# Patient Record
Sex: Male | Born: 1965 | Race: White | Hispanic: No | Marital: Single | Smoking: Never smoker
Health system: Southern US, Community
[De-identification: ages and names within clinical notes are randomized; demographics above are authoritative.]

## PROBLEM LIST (undated history)

## (undated) DIAGNOSIS — N390 Urinary tract infection, site not specified: Secondary | ICD-10-CM

## (undated) DIAGNOSIS — A419 Sepsis, unspecified organism: Secondary | ICD-10-CM

## (undated) DIAGNOSIS — L03116 Cellulitis of left lower limb: Secondary | ICD-10-CM

## (undated) DIAGNOSIS — N312 Flaccid neuropathic bladder, not elsewhere classified: Secondary | ICD-10-CM

## (undated) DIAGNOSIS — R569 Unspecified convulsions: Secondary | ICD-10-CM

## (undated) DIAGNOSIS — Z982 Presence of cerebrospinal fluid drainage device: Secondary | ICD-10-CM

## (undated) DIAGNOSIS — Z789 Other specified health status: Secondary | ICD-10-CM

## (undated) DIAGNOSIS — G919 Hydrocephalus, unspecified: Secondary | ICD-10-CM

## (undated) DIAGNOSIS — Q059 Spina bifida, unspecified: Secondary | ICD-10-CM

## (undated) DIAGNOSIS — I619 Nontraumatic intracerebral hemorrhage, unspecified: Secondary | ICD-10-CM

## (undated) HISTORY — PX: OTHER SURGICAL HISTORY: SHX169

---
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
## 2008-11-14 ENCOUNTER — Inpatient Hospital Stay (HOSPITAL_COMMUNITY): Admission: EM | Admit: 2008-11-14 | Discharge: 2008-11-16 | Payer: Self-pay | Admitting: Neurological Surgery

## 2008-11-18 ENCOUNTER — Ambulatory Visit: Payer: Self-pay | Admitting: Cardiology

## 2009-01-07 ENCOUNTER — Encounter: Admission: RE | Admit: 2009-01-07 | Discharge: 2009-01-07 | Payer: Self-pay | Admitting: Neurological Surgery

## 2010-08-03 LAB — CARDIAC PANEL(CRET KIN+CKTOT+MB+TROPI)
Relative Index: 0.7 (ref 0.0–2.5)
Total CK: 258 U/L — ABNORMAL HIGH (ref 7–232)
Total CK: 382 U/L — ABNORMAL HIGH (ref 7–232)
Troponin I: 0.02 ng/mL (ref 0.00–0.06)
Troponin I: 0.05 ng/mL (ref 0.00–0.06)

## 2010-08-03 LAB — BRAIN NATRIURETIC PEPTIDE: Pro B Natriuretic peptide (BNP): <30 pg/mL (ref 0.0–100.0)

## 2010-09-09 NOTE — H&P (Signed)
NAMECLINT, Carlos Ferguson NO.:  [id number]   MEDICAL RECORD NO.:  [id number]          PATIENT TYPE:  INP   LOCATION:  3101                         FACILITY:  [facility]   PHYSICIAN:  Tia Alert, MD     DATE OF BIRTH:  July 17, 1965   DATE OF ADMISSION:  11/14/2008  DATE OF DISCHARGE:                              HISTORY & PHYSICAL   ADMISSION DIAGNOSES:  1. Chiari II malformation.  2. Syncopal episode versus seizure.   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  He also  underwent ventriculoperitoneal shunting and the family states he has had  a nonworking shunt since the age of 45 at which time he was told he had  outgrown the shunt.  He was at work last evening when his supervisor  noted that he was less responsive.  He then passed out, turned blue,  and then just as they were about to begin CPR, he started breathing and  his color came back.  He was taken to [facility] Emergency Department  where he was worked up.  CT scan showed massive hydrocephalus with 2  densities in the brain.  According to the ER physician, a  neurosurgical evaluation was requested.  The family does report that the  patient had gone to the nursing supervisor earlier in the day stating,  he did not feel right and had his blood pressure taken.  He denies any  headache or visual changes at this time.  He denies any numbness,  tingling, or weakness.  He denies any chest pain other than occasional  chest tightness.  He denies a history of cardiac disease.   PAST MEDICAL HISTORY:  1. Hypertension.  2. Bladder dysfunction.  3. 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   He has had multiple orthopedic procedures to the lower extremities.   MEDICATIONS:  He takes an  antihypertensive, which he does not recall the  name and Ditropan.   ALLERGIES:  PENICILLIN, LATEX, and an ANESTHETIC that he cannot recall.   SOCIAL HISTORY:  Denies use of tobacco or alcohol products.  He works  and ambulates with crutches and braces on the lower extremities.   PHYSICAL EXAMINATION:  He is awake and alert.  He is interactive.  He  has no aphasia.  He has good attention span.  No facial asymmetry.  Tongue protrudes in the midline.  He can puff out his cheeks somewhat.  He has some decreased facial function in the upper face.  Extraocular  movements are intact, but he has nystagmus to the right.  He moves his  upper extremities well with good strength.  His lower extremities are  shortened in length, has had multiple procedures and therefore has  multiple scarring.  He has very little distal function, more proximal  function.  He has braces on his left leg.  He has  a shunt in the right  postauricular region and a well-healed lumbar scar.   Labs were reviewed from the outside hospital.  A CT scan was reviewed.  The CT scan shows typical elongation of the skull and a frontal to  occipital plain, there is abnormality of the shape of the ventricular  system, which is expected and a Chiari II malformation.  There is  obvious descent of the cerebellar tonsils at the level of the foramen  magnum.  There is a shunt in the right occipital horn that is quite  short.  The ventricles do not look overly enlarged to me.  He is always  going to have ventriculomegaly, but this does not look like true  hydrocephalus.  There is no rounding of the third ventricle.  There is a  small round hyperdensity in the left frontal region.  There is also one  adjacent to the right subependymal region of the right anterior horn of  the lateral ventricle.  There is no shift, mass effect, or edema  associated with these lesions.   ASSESSMENT/PLAN:  This is a 45 year old gentleman with a history of   myelomeningocele and Chiari II malformation who had some type of event  at work today whether syncopal event or seizure, it is difficult to  know.  He has a small round hyperdensity in the left frontal region,  which could be a small contusion or intracerebral hemorrhage.  It could  be an old calcified lesion from a previous intracerebral hemorrhage,  especially as an infant.  There is one small area adjacent to the  subependymal region on the right, which looks somewhat similar.  He has  an obvious Chiari malformation with descent of his tonsils at the level  of the foramen magnum.  He has probably a nonfunctioning shunt in the  right occipital region, but this has likely been nonfunctioning for many  many years and it is likely that he no longer needs ventriculoperitoneal  shunting.  In a child who would have this type of symptomatology with  syncope or seizure obviously, we will check the shunt first and foremost  to look for shunt malfunction; however, my guess is this shunt has not  worked for many years and that is not the cause of this event earlier in  the day.  Again, I do not think his ventricular system is overly  enlarged and it does not appear to be acute hydrocephalus.  Certainly,  this could be related to brainstem dysfunction since we know Chiari II  patients have abnormal brain stem nuclei, this could be related to his  Chiari II malformation with descent of the tonsils with pressure on the  brain stem and I think an MRI is warranted, this will help [place] identify  the etiology of the left frontal lesion and also help [place] identify his  Chiari malformation.  Also I think he needs a syncopal workup.  We will  rule out an MI since he has had some chest tightness with cardiac  enzymes.  We will get an EEG to rule out seizure activity.  Dr. Jordan Likes  will assume his care in the morning as I will be out of town for the  next 5 days.  This has been explained to the family.  Questions  have  been encouraged and answered.      Tia Alert, MD  Electronically Signed     DSJ/MEDQ  D:  11/14/2008  T:  11/14/2008  Job:  [id number]

## 2010-09-09 NOTE — Procedures (Signed)
CLINICAL HISTORY:  A 45 year old male was admitted after syncope at  work, had a history of spinal bifida, hydrocephalous, Arnold-Chiari II  malformation, history of hypertension.   CURRENT MEDICATIONS:  Ditropan, Tylenol, Zofran.   TECHNICAL COMMENTS:  Upon awakening, the posterior background activity  has mildly decreased amplitude 14 microvoltage, mixed alpha-theta  frequency.  This was also a technically difficult study, because  frequent bifrontal motion muscle artifacts, and C3 electrode artifact,  there was mildly slowing on the left posterior background activity, in  the range of theta and mixed with some delta activity as well.  I cannot  rule out an effect of electrode artifact to it.   An photic stimulation hyperventilation was performed.  There was no  abnormality elicited other than mentioned above, the patient was drowsy  during recording, but there was no deeper stage of sleep was achieved.   In conclusion, this is a mild abnormal study, there was mild left  posterior background slowing, and also a C3 electrode artifact, with  persistent higher amplitude and mixed theta and delta range activity,  and repeat EEG would be helpful.      Levert Feinstein, MD  Electronically Signed     [id number]  D:  11/14/2008 16:15:36  T:  11/15/2008 05:45:33  Job #:  [id number]

## 2011-12-24 DIAGNOSIS — R112 Nausea with vomiting, unspecified: Secondary | ICD-10-CM

## 2012-04-27 DIAGNOSIS — G919 Hydrocephalus, unspecified: Secondary | ICD-10-CM

## 2012-04-27 DIAGNOSIS — A419 Sepsis, unspecified organism: Secondary | ICD-10-CM

## 2012-04-27 DIAGNOSIS — I619 Nontraumatic intracerebral hemorrhage, unspecified: Secondary | ICD-10-CM

## 2012-04-27 DIAGNOSIS — L03116 Cellulitis of left lower limb: Secondary | ICD-10-CM

## 2012-04-27 HISTORY — DX: Nontraumatic intracerebral hemorrhage, unspecified: I61.9

## 2012-04-27 HISTORY — DX: Sepsis, unspecified organism: A41.9

## 2012-04-27 HISTORY — DX: Hydrocephalus, unspecified: G91.9

## 2012-04-27 HISTORY — DX: Cellulitis of left lower limb: L03.116

## 2018-12-25 ENCOUNTER — Emergency Department (HOSPITAL_COMMUNITY): Payer: Medicare Other

## 2018-12-25 ENCOUNTER — Inpatient Hospital Stay (HOSPITAL_COMMUNITY): Payer: Medicare Other

## 2018-12-25 ENCOUNTER — Other Ambulatory Visit: Payer: Self-pay

## 2018-12-25 ENCOUNTER — Encounter (HOSPITAL_COMMUNITY): Payer: Self-pay

## 2018-12-25 ENCOUNTER — Inpatient Hospital Stay (HOSPITAL_COMMUNITY)
Admission: EM | Admit: 2018-12-25 | Discharge: 2018-12-28 | DRG: 064 | Disposition: A | Discharge status: Rehabilitation Facility | Payer: Medicare Other | Attending: Neurology | Admitting: Neurology

## 2018-12-25 DIAGNOSIS — I614 Nontraumatic intracerebral hemorrhage in cerebellum: Secondary | ICD-10-CM | POA: Diagnosis present

## 2018-12-25 DIAGNOSIS — Z8249 Family history of ischemic heart disease and other diseases of the circulatory system: Secondary | ICD-10-CM | POA: Diagnosis not present

## 2018-12-25 DIAGNOSIS — I619 Nontraumatic intracerebral hemorrhage, unspecified: Secondary | ICD-10-CM | POA: Diagnosis present

## 2018-12-25 DIAGNOSIS — Z87440 Personal history of urinary (tract) infections: Secondary | ICD-10-CM

## 2018-12-25 DIAGNOSIS — Q6589 Other specified congenital deformities of hip: Secondary | ICD-10-CM

## 2018-12-25 DIAGNOSIS — Z20828 Contact with and (suspected) exposure to other viral communicable diseases: Secondary | ICD-10-CM | POA: Diagnosis present

## 2018-12-25 DIAGNOSIS — Z823 Family history of stroke: Secondary | ICD-10-CM

## 2018-12-25 DIAGNOSIS — Z880 Allergy status to penicillin: Secondary | ICD-10-CM

## 2018-12-25 DIAGNOSIS — Y831 Surgical operation with implant of artificial internal device as the cause of abnormal reaction of the patient, or of later complication, without mention of misadventure at the time of the procedure: Secondary | ICD-10-CM | POA: Diagnosis present

## 2018-12-25 DIAGNOSIS — R339 Retention of urine, unspecified: Secondary | ICD-10-CM | POA: Diagnosis present

## 2018-12-25 DIAGNOSIS — T8509XA Other mechanical complication of ventricular intracranial (communicating) shunt, initial encounter: Secondary | ICD-10-CM | POA: Diagnosis present

## 2018-12-25 DIAGNOSIS — I10 Essential (primary) hypertension: Secondary | ICD-10-CM | POA: Diagnosis present

## 2018-12-25 DIAGNOSIS — R197 Diarrhea, unspecified: Secondary | ICD-10-CM | POA: Diagnosis present

## 2018-12-25 DIAGNOSIS — Z91040 Latex allergy status: Secondary | ICD-10-CM

## 2018-12-25 DIAGNOSIS — Z6833 Body mass index (BMI) 33.0-33.9, adult: Secondary | ICD-10-CM | POA: Diagnosis not present

## 2018-12-25 DIAGNOSIS — R569 Unspecified convulsions: Secondary | ICD-10-CM | POA: Diagnosis not present

## 2018-12-25 DIAGNOSIS — G936 Cerebral edema: Secondary | ICD-10-CM | POA: Diagnosis present

## 2018-12-25 DIAGNOSIS — E876 Hypokalemia: Secondary | ICD-10-CM | POA: Diagnosis present

## 2018-12-25 DIAGNOSIS — D72829 Elevated white blood cell count, unspecified: Secondary | ICD-10-CM | POA: Diagnosis not present

## 2018-12-25 DIAGNOSIS — Z8673 Personal history of transient ischemic attack (TIA), and cerebral infarction without residual deficits: Secondary | ICD-10-CM | POA: Diagnosis not present

## 2018-12-25 DIAGNOSIS — Q0703 Arnold-Chiari syndrome with spina bifida and hydrocephalus: Secondary | ICD-10-CM

## 2018-12-25 DIAGNOSIS — Q052 Lumbar spina bifida with hydrocephalus: Secondary | ICD-10-CM | POA: Diagnosis not present

## 2018-12-25 DIAGNOSIS — Q059 Spina bifida, unspecified: Secondary | ICD-10-CM

## 2018-12-25 DIAGNOSIS — N390 Urinary tract infection, site not specified: Secondary | ICD-10-CM | POA: Diagnosis present

## 2018-12-25 DIAGNOSIS — R471 Dysarthria and anarthria: Secondary | ICD-10-CM | POA: Diagnosis present

## 2018-12-25 DIAGNOSIS — G40909 Epilepsy, unspecified, not intractable, without status epilepticus: Secondary | ICD-10-CM | POA: Diagnosis present

## 2018-12-25 DIAGNOSIS — R21 Rash and other nonspecific skin eruption: Secondary | ICD-10-CM | POA: Diagnosis present

## 2018-12-25 DIAGNOSIS — R42 Dizziness and giddiness: Secondary | ICD-10-CM | POA: Diagnosis not present

## 2018-12-25 DIAGNOSIS — R0989 Other specified symptoms and signs involving the circulatory and respiratory systems: Secondary | ICD-10-CM | POA: Diagnosis not present

## 2018-12-25 DIAGNOSIS — N312 Flaccid neuropathic bladder, not elsewhere classified: Secondary | ICD-10-CM | POA: Diagnosis not present

## 2018-12-25 DIAGNOSIS — K592 Neurogenic bowel, not elsewhere classified: Secondary | ICD-10-CM | POA: Diagnosis not present

## 2018-12-25 DIAGNOSIS — R51 Headache: Secondary | ICD-10-CM | POA: Diagnosis not present

## 2018-12-25 DIAGNOSIS — E669 Obesity, unspecified: Secondary | ICD-10-CM | POA: Diagnosis present

## 2018-12-25 DIAGNOSIS — I69391 Dysphagia following cerebral infarction: Secondary | ICD-10-CM | POA: Diagnosis not present

## 2018-12-25 DIAGNOSIS — I69198 Other sequelae of nontraumatic intracerebral hemorrhage: Secondary | ICD-10-CM | POA: Diagnosis not present

## 2018-12-25 HISTORY — DX: Other specified health status: Z78.9

## 2018-12-25 HISTORY — DX: Flaccid neuropathic bladder, not elsewhere classified: N31.2

## 2018-12-25 HISTORY — DX: Presence of cerebrospinal fluid drainage device: Z98.2

## 2018-12-25 HISTORY — DX: Urinary tract infection, site not specified: N39.0

## 2018-12-25 HISTORY — DX: Unspecified convulsions: R56.9

## 2018-12-25 HISTORY — DX: Spina bifida, unspecified: Q05.9

## 2018-12-25 LAB — URINALYSIS, ROUTINE W REFLEX MICROSCOPIC
Bilirubin Urine: NEGATIVE
Glucose, UA: NEGATIVE mg/dL
Hgb urine dipstick: NEGATIVE
Ketones, ur: 5 mg/dL — AB
Leukocytes,Ua: NEGATIVE
Nitrite: NEGATIVE
Protein, ur: NEGATIVE mg/dL
Specific Gravity, Urine: 1.023 (ref 1.005–1.030)
pH: 5 (ref 5.0–8.0)

## 2018-12-25 LAB — CBC WITH DIFFERENTIAL/PLATELET
Abs Immature Granulocytes: 0.03 10*3/uL (ref 0.00–0.07)
Basophils Absolute: 0 10*3/uL (ref 0.0–0.1)
Basophils Relative: 0 %
Eosinophils Absolute: 0 10*3/uL (ref 0.0–0.5)
Eosinophils Relative: 0 %
HCT: 44.6 % (ref 39.0–52.0)
Hemoglobin: 15.7 g/dL (ref 13.0–17.0)
Immature Granulocytes: 0 %
Lymphocytes Relative: 6 %
Lymphs Abs: 0.8 10*3/uL (ref 0.7–4.0)
MCH: 32 pg (ref 26.0–34.0)
MCHC: 35.2 g/dL (ref 30.0–36.0)
MCV: 90.8 fL (ref 80.0–100.0)
Monocytes Absolute: 0.8 10*3/uL (ref 0.1–1.0)
Monocytes Relative: 6 %
Neutro Abs: 11.6 10*3/uL — ABNORMAL HIGH (ref 1.7–7.7)
Neutrophils Relative %: 88 %
Platelets: 248 10*3/uL (ref 150–400)
RBC: 4.91 MIL/uL (ref 4.22–5.81)
RDW: 13.9 % (ref 11.5–15.5)
WBC: 13.2 10*3/uL — ABNORMAL HIGH (ref 4.0–10.5)
nRBC: 0 % (ref 0.0–0.2)

## 2018-12-25 LAB — COMPREHENSIVE METABOLIC PANEL
ALT: 24 U/L (ref 0–44)
AST: 18 U/L (ref 15–41)
Albumin: 3.8 g/dL (ref 3.5–5.0)
Alkaline Phosphatase: 60 U/L (ref 38–126)
Anion gap: 11 (ref 5–15)
BUN: 9 mg/dL (ref 6–20)
CO2: 27 mmol/L (ref 22–32)
Calcium: 9.5 mg/dL (ref 8.9–10.3)
Chloride: 101 mmol/L (ref 98–111)
Creatinine, Ser: 0.86 mg/dL (ref 0.61–1.24)
GFR calc Af Amer: >60 mL/min (ref >60)
GFR calc non Af Amer: >60 mL/min (ref >60)
Glucose, Bld: 115 mg/dL — ABNORMAL HIGH (ref 70–99)
Potassium: 3.6 mmol/L (ref 3.5–5.1)
Sodium: 139 mmol/L (ref 135–145)
Total Bilirubin: 0.6 mg/dL (ref 0.3–1.2)
Total Protein: 7 g/dL (ref 6.5–8.1)

## 2018-12-25 LAB — LIPASE, BLOOD: Lipase: 41 U/L (ref 11–51)

## 2018-12-25 MED ORDER — SODIUM CHLORIDE 0.9 % IV BOLUS
500.0000 mL | Freq: Once | INTRAVENOUS | Status: AC
  Administered 2018-12-25: 16:00:00 500 mL via INTRAVENOUS

## 2018-12-25 MED ORDER — ONDANSETRON HCL 4 MG/2ML IJ SOLN
4.0000 mg | Freq: Once | INTRAMUSCULAR | Status: AC
  Administered 2018-12-25: 4 mg via INTRAVENOUS
  Filled 2018-12-25: qty 2

## 2018-12-25 MED ORDER — ACETAMINOPHEN 650 MG RE SUPP: 650.0000 mg | RECTAL | Status: DC | PRN

## 2018-12-25 MED ORDER — IOHEXOL 350 MG/ML SOLN
80.0000 mL | Freq: Once | INTRAVENOUS | Status: AC | PRN
  Administered 2018-12-25: 80 mL via INTRAVENOUS

## 2018-12-25 MED ORDER — LEVETIRACETAM 750 MG PO TABS
1500.0000 mg | ORAL_TABLET | Freq: Two times a day (BID) | ORAL | Status: DC
  Administered 2018-12-25 – 2018-12-28 (×6): 1500 mg via ORAL
  Filled 2018-12-25 (×6): qty 2

## 2018-12-25 MED ORDER — POTASSIUM CHLORIDE 20 MEQ PO PACK: 20.0000 meq | PACK | Freq: Every day | ORAL | Status: DC

## 2018-12-25 MED ORDER — ACETAMINOPHEN 325 MG PO TABS
650.0000 mg | ORAL_TABLET | ORAL | Status: DC | PRN
  Administered 2018-12-26 – 2018-12-28 (×5): 650 mg via ORAL
  Filled 2018-12-25 (×5): qty 2

## 2018-12-25 MED ORDER — STROKE: EARLY STAGES OF RECOVERY BOOK
Freq: Once | Status: AC
  Administered 2018-12-26: 12:00:00
  Filled 2018-12-25: qty 1

## 2018-12-25 MED ORDER — ATENOLOL 25 MG PO TABS
50.0000 mg | ORAL_TABLET | Freq: Every day | ORAL | Status: DC
  Administered 2018-12-26 – 2018-12-28 (×3): 50 mg via ORAL
  Filled 2018-12-25: qty 1
  Filled 2018-12-25: qty 2
  Filled 2018-12-25: qty 1

## 2018-12-25 MED ORDER — CLEVIDIPINE BUTYRATE 0.5 MG/ML IV EMUL
0.0000 mg/h | INTRAVENOUS | Status: DC
  Administered 2018-12-25: 22:00:00 5 mg/h via INTRAVENOUS
  Administered 2018-12-26: 3 mg/h via INTRAVENOUS
  Administered 2018-12-26: 09:00:00 12 mg/h via INTRAVENOUS
  Filled 2018-12-25 (×4): qty 50

## 2018-12-25 MED ORDER — PANTOPRAZOLE SODIUM 40 MG IV SOLR
40.0000 mg | Freq: Every day | INTRAVENOUS | Status: DC
  Administered 2018-12-25: 40 mg via INTRAVENOUS
  Filled 2018-12-25: qty 40

## 2018-12-25 MED ORDER — SENNOSIDES-DOCUSATE SODIUM 8.6-50 MG PO TABS
1.0000 | ORAL_TABLET | Freq: Two times a day (BID) | ORAL | Status: DC
  Filled 2018-12-25 (×4): qty 1

## 2018-12-25 MED ORDER — GADOBUTROL 1 MMOL/ML IV SOLN
7.0000 mL | Freq: Once | INTRAVENOUS | Status: AC | PRN
  Administered 2018-12-25: 22:00:00 7 mL via INTRAVENOUS

## 2018-12-25 MED ORDER — CHLORHEXIDINE GLUCONATE CLOTH 2 % EX PADS
6.0000 | MEDICATED_PAD | Freq: Every day | CUTANEOUS | Status: DC
  Administered 2018-12-26 – 2018-12-28 (×3): 6 via TOPICAL

## 2018-12-25 MED ORDER — POTASSIUM CHLORIDE CRYS ER 20 MEQ PO TBCR
20.0000 meq | EXTENDED_RELEASE_TABLET | Freq: Every day | ORAL | Status: DC
  Administered 2018-12-26 – 2018-12-28 (×3): 20 meq via ORAL
  Filled 2018-12-25 (×3): qty 1

## 2018-12-25 MED ORDER — ACETAMINOPHEN 160 MG/5ML PO SOLN: 650.0000 mg | ORAL | Status: DC | PRN

## 2018-12-25 MED ORDER — LABETALOL HCL 5 MG/ML IV SOLN
20.0000 mg | Freq: Once | INTRAVENOUS | Status: AC
  Administered 2018-12-25: 20:00:00 20 mg via INTRAVENOUS
  Filled 2018-12-25: qty 4

## 2018-12-25 NOTE — ED Notes (Signed)
ED TO INPATIENT HANDOFF REPORT  ED Nurse Name and Phone #:  Yeni Jiggetts [phone]  S Name/Age/Gender Michel Bickers 53 y.o. male Room/Bed: 039C/039C  Code Status   Code Status: Full Code  Home/SNF/Other Home Patient oriented to: self, place, time and situation Is this baseline? Yes   Triage Complete: Triage complete  Chief Complaint VOMITTING AND HEADACHE  Triage Note Patient arrived via POV due to not feeling weel x1 week. Per pts father, patient started having diarrhea 1 week ago, that lasted the entire Monday. Patient went to ER at [facility] and was treated for a UTI. Did not improve when sent home. Patient then had an ongoing headache and n/v. Went to [facility] on Wednesday-8/26 to be tested for COVID. Patient tested negative. Patient then went back to [facility] on Sat-8/29 and was treated for nausea and given potassium.   Pt came in today due ongoing nausea, headache, and weakness.    Allergies Allergies  Allergen Reactions  . Latex Anaphylaxis and Hives  . Penicillins Hives and Itching    Level of Care/Admitting Diagnosis ED Disposition    ED Disposition Condition Comment   Admit  Hospital Area: [facility] [[id number]]  Level of Care: ICU [6]  Covid Evaluation: Asymptomatic Screening Protocol (No Symptoms)  Diagnosis: ICH (intracerebral hemorrhage) [facility]) [[id number]]  Admitting Physician: Otelia Limes ERIC Valen.Docker  Attending Physician: Otelia Limes, ERIC Valen.Docker  Estimated length of stay: 5 - 7 days  Certification:: I certify this patient will need inpatient services for at least 2 midnights  PT Class (Do Not Modify): Inpatient [101]  PT Acc Code (Do Not Modify): Private [1]       B Medical/Surgery History Past Medical History:  Diagnosis Date  . Self-catheterizes urinary bladder   . Spina bifida [facility])    Past Surgical History:  Procedure Laterality Date  . CSF SHUNT  1969     A IV Location/Drains/Wounds Patient Lines/Drains/Airways Status    Active Line/Drains/Airways    Name:   Placement date:   Placement time:   Site:   Days:   Peripheral IV 12/25/18 Left Antecubital   12/25/18    1610    Antecubital   less than 1          Intake/Output Last 24 hours  Intake/Output Summary (Last 24 hours) at 12/25/2018 2025 Last data filed at 12/25/2018 1826 Gross per 24 hour  Intake 500 ml  Output -  Net 500 ml    Labs/Imaging Results for orders placed or performed during the hospital encounter of 12/25/18 (from the past 48 hour(s))  Comprehensive metabolic panel     Status: Abnormal   Collection Time: 12/25/18  4:23 PM  Result Value Ref Range   Sodium 139 135 - 145 mmol/L   Potassium 3.6 3.5 - 5.1 mmol/L   Chloride 101 98 - 111 mmol/L   CO2 27 22 - 32 mmol/L   Glucose, Bld 115 (H) 70 - 99 mg/dL   BUN 9 6 - 20 mg/dL   Creatinine, Ser 8.28 0.61 - 1.24 mg/dL   Calcium 9.5 8.9 - 00.3 mg/dL   Total Protein 7.0 6.5 - 8.1 g/dL   Albumin 3.8 3.5 - 5.0 g/dL   AST 18 15 - 41 U/L   ALT 24 0 - 44 U/L   Alkaline Phosphatase 60 38 - 126 U/L   Total Bilirubin 0.6 0.3 - 1.2 mg/dL   GFR calc non Af Amer >60 >60 mL/min   GFR calc Af Amer >60 >60  mL/min   Anion gap 11 5 - 15    Comment: Performed at [facility] [address], [place], [place] [address]  CBC with Differential     Status: Abnormal   Collection Time: 12/25/18  4:23 PM  Result Value Ref Range   WBC 13.2 (H) 4.0 - 10.5 K/uL   RBC 4.91 4.22 - 5.81 MIL/uL   Hemoglobin 15.7 13.0 - 17.0 g/dL   HCT 44.6 39.0 - 52.0 %   MCV 90.8 80.0 - 100.0 fL   MCH 32.0 26.0 - 34.0 pg   MCHC 35.2 30.0 - 36.0 g/dL   RDW 13.9 11.5 - 15.5 %   Platelets 248 150 - 400 K/uL   nRBC 0.0 0.0 - 0.2 %   Neutrophils Relative % 88 %   Neutro Abs 11.6 (H) 1.7 - 7.7 K/uL   Lymphocytes Relative 6 %   Lymphs Abs 0.8 0.7 - 4.0 K/uL   Monocytes Relative 6 %   Monocytes Absolute 0.8 0.1 - 1.0 K/uL   Eosinophils Relative 0 %   Eosinophils Absolute 0.0 0.0 - 0.5 K/uL   Basophils Relative 0 %    Basophils Absolute 0.0 0.0 - 0.1 K/uL   Immature Granulocytes 0 %   Abs Immature Granulocytes 0.03 0.00 - 0.07 K/uL    Comment: Performed at [facility] [address] [place], [place] [address]  Lipase, blood     Status: None   Collection Time: 12/25/18  4:23 PM  Result Value Ref Range   Lipase 41 11 - 51 U/L    Comment: Performed at [facility] [address] [place], [place] [address]  Urinalysis, Routine w reflex microscopic     Status: Abnormal   Collection Time: 12/25/18  4:23 PM  Result Value Ref Range   Color, Urine YELLOW YELLOW   APPearance CLEAR CLEAR   Specific Gravity, Urine 1.023 1.005 - 1.030   pH 5.0 5.0 - 8.0   Glucose, UA NEGATIVE NEGATIVE mg/dL   Hgb urine dipstick NEGATIVE NEGATIVE   Bilirubin Urine NEGATIVE NEGATIVE   Ketones, ur 5 (A) NEGATIVE mg/dL   Protein, ur NEGATIVE NEGATIVE mg/dL   Nitrite NEGATIVE NEGATIVE   Leukocytes,Ua NEGATIVE NEGATIVE    Comment: Performed at [facility] [address] [place], [place] [address]   Dg Skull 1-3 Views  Result Date: 12/25/2018 CLINICAL DATA:  Diarrhea. Not feeling well. Evaluate for shunt malfunction. EXAM: SKULL - 1-3 VIEW COMPARISON:  None. FINDINGS: Right parietal approach ventricular shunt catheter terminates posteriorly in the midline. Expected shunt discontinuity overlying right posteroinferior calvarium. Otherwise no evidence of shunt kink or discontinuity in the head or neck. Smooth prominence of the sagittal and coronal sutures. No fractures or focal osseous lesions. IMPRESSION: Right parietal approach ventricular shunt catheter terminates posteriorly in the midline. No unexpected shunt kink or discontinuity in the head or neck. Electronically Signed   By: Ilona Sorrel M.D.   On: 12/25/2018 17:30   Dg Chest 1 View  Result Date: 12/25/2018 CLINICAL DATA:  Evaluate for shunt malfunction. EXAM: CHEST  1 VIEW COMPARISON:  None. FINDINGS: Low lung volumes. Top-normal heart size. Normal  mediastinal contour. No pneumothorax. No pleural effusion. Lungs appear clear, with no acute consolidative airspace disease and no pulmonary edema. Right-sided VP shunt catheter courses inferiorly from the right neck into the medial right chest, and is not clearly visualized below the level of the T6 level. IMPRESSION: Right-sided VP shunt catheter  courses inferiorly from the right neck into the medial right chest, and is not clearly visualized below the level of the T6 level, concerning for shunt catheter fracture. Please see the separate concurrent abdominal radiograph report for further details. A CT of the chest, abdomen and pelvis could be obtained to confirm this finding, as clinically warranted. Electronically Signed   By: Delbert PhenixJason A Poff M.D.   On: 12/25/2018 17:35   Dg Abd 1 View  Result Date: 12/25/2018 CLINICAL DATA:  Evaluate for shunt malfunction. EXAM: ABDOMEN - 1 VIEW COMPARISON:  12/02/2016 CT abdomen/pelvis. FINDINGS: There is a fractured shunt catheter fragment coiled in the deep pelvis, as seen on 12/02/2016 CT abdomen/pelvis, which does not communicate with the shunt catheter in the right neck and upper chest. No dilated small bowel loops. No evidence of pneumatosis or pneumoperitoneum. No radiopaque nephrolithiasis. Sequela of congenital right-sided femoroacetabular dysplasia. Partially visualized fixation hardware in lateral proximal right femur. IMPRESSION: Fractured shunt catheter fragment coiled in the deep pelvis, which does not communicate with the shunt catheter in the right neck and upper chest. Nonobstructive bowel gas pattern. Electronically Signed   By: Delbert PhenixJason A Poff M.D.   On: 12/25/2018 17:39   Ct Head Wo Contrast  Result Date: 12/25/2018 CLINICAL DATA:  53 year old male with headache and vomiting. History of VP shunt. EXAM: CT HEAD WITHOUT CONTRAST TECHNIQUE: Contiguous axial images were obtained from the base of the skull through the vertex without intravenous contrast.  COMPARISON:  01/07/2009 CT FINDINGS: Brain: A 3 cm LEFT cerebellar hematoma is noted with adjacent edema. A RIGHT posterior parietal ventriculostomy catheter is again noted with tip at the posterior midline. Severe hydrocephalus has slightly increased from 2010. Minimal chronic changes in the frontal regions bilaterally noted. Vascular: No hyperdense vessel or unexpected calcification. Skull: No acute abnormality Sinuses/Orbits: No acute abnormality. Other: None IMPRESSION: 1. 3 cm LEFT cerebellar hematoma. 2. Severe hydrocephalus, slightly increased since 2010. Unchanged RIGHT posterior parietal ventriculostomy catheter with tip at the midline. Critical Value/emergent results were called by telephone at the time of interpretation on 12/25/2018 at 4:50 pm to Dr. Shawna OrleansMELANIE BELFI , who verbally acknowledged these results. Electronically Signed   By: Harmon PierJeffrey  Hu M.D.   On: 12/25/2018 16:50    Pending Labs Unresulted Labs (From admission, onward)    Start     Ordered   12/25/18 1918  HIV antibody (Routine Testing)  Once,   STAT     12/25/18 1927   12/25/18 1757  SARS CORONAVIRUS 2 (TAT 6-12 HRS) Nasal Swab Aptima Multi Swab  (Asymptomatic/Tier 2 Patients Labs)  Once,   STAT    Question Answer Comment  Is this test for diagnosis or screening Screening   Symptomatic for COVID-19 as defined by CDC No   Hospitalized for COVID-19 No   Admitted to ICU for COVID-19 No   Previously tested for COVID-19 No   Resident in a congregate (group) care setting No   Employed in healthcare setting No      12/25/18 1756          Vitals/Pain Today's Vitals   12/25/18 1845 12/25/18 1851 12/25/18 1915 12/25/18 1945  BP: (!) 158/75 (!) 175/75 (!) 172/74 (!) 159/74  Pulse:  64    Resp: 15 18 20 18   Temp:  98.5 F (36.9 C)    TempSrc:  Oral    SpO2:  98%    Weight:      Height:      PainSc:  Isolation Precautions No active isolations  Medications Medications   stroke: mapping our early stages of  recovery book (has no administration in time range)  acetaminophen (TYLENOL) tablet 650 mg (has no administration in time range)    Or  acetaminophen (TYLENOL) solution 650 mg (has no administration in time range)    Or  acetaminophen (TYLENOL) suppository 650 mg (has no administration in time range)  senna-docusate (Senokot-S) tablet 1 tablet (has no administration in time range)  pantoprazole (PROTONIX) injection 40 mg (has no administration in time range)  labetalol (NORMODYNE) injection 20 mg (20 mg Intravenous Given 12/25/18 1949)    And  clevidipine (CLEVIPREX) infusion 0.5 mg/mL (has no administration in time range)  sodium chloride 0.9 % bolus 500 mL (0 mLs Intravenous Stopped 12/25/18 1826)  ondansetron (ZOFRAN) injection 4 mg (4 mg Intravenous Given 12/25/18 1615)  ondansetron (ZOFRAN) injection 4 mg (4 mg Intravenous Given 12/25/18 1945)  iohexol (OMNIPAQUE) 350 MG/ML injection 80 mL (80 mLs Intravenous Contrast Given 12/25/18 2001)    Mobility walks with device Moderate fall risk   Focused Assessments gi/gu   R Recommendations: See Admitting Provider Note  Report given to:   Additional Notes:

## 2018-12-25 NOTE — ED Notes (Signed)
Pt in radiology 

## 2018-12-25 NOTE — H&P (Addendum)
Admission H&P    Chief Complaint: Subacute cerebellar hemorrhage.   HPI: Carlos Ferguson is an 53 y.o. male with a history of spina bifida and hydrocephalus, s/p shunt placement many years ago who presents with a 4 day history of N/V. On arrival to the ED, CT was obtained revealing a left cerebellar hemorrhage.   Also noted on CT was a ventricular shunt and interval enlargement of the ventricles (increased ventriculomegaly) relative to the prior CT head obtained in 2010. Shunt series revealed fracture of the shunt catheter consistent with shunt failure.   Past Medical History:  Diagnosis Date  . Self-catheterizes urinary bladder   . Spina bifida [facility](HCC)     Past Surgical History:  Procedure Laterality Date  . CSF SHUNT  1969    History reviewed. No pertinent family history. Social History:  reports that he has never smoked. He has never used smokeless tobacco. He reports previous alcohol use. He reports that he does not use drugs.  Allergies:  Allergies  Allergen Reactions  . Latex Anaphylaxis and Hives  . Penicillins Hives and Itching    Home medications (from bag brought by patient) Keppra 1500 mg po BID Potassium chloride 20 meq qd Atenolol 50 mg po qd Trimethoprim-sulfamethoxazole PRN bladder infection  ROS: 8/10 headache worse with lying that often resolves with standing. No CP, cough, abdominal pain or new limb weakness. Other than the headache and symptoms described in the HPI, comprehensive ROS was negative.   Physical Examination: Blood pressure (!) 175/75, pulse 64, temperature 98.5 F (36.9 C), temperature source Oral, resp. rate 18, height 4\' 9"  (1.448 m), weight 70.8 kg, SpO2 98 %.  HEENT-  Normocephalic Lungs - Respirations unlabored Extremities - Foreshortened/small BLE with muscle atrophy and left knee scarring from prior cellulitis.   Neurologic Examination: Ment: Awake and fully alert. Pleasant and cooperative. Fully oriented. Speech fluent with intact  comprehension and naming. Has dysarthric scanning speech pattern.  CN:  PERRL. EOM are full but with prominent horizontal nystagmus when gazing to left and right. Also with vertical nystagmus induced by upgaze. Facial sensation equal to temp bilaterally. Face symmetric. Phonation intact. Head midline. Tongue midline. Palate elevates normally.  Motor: 5/5 bilateral upper extremities.  0 to 2/5 RLE 3-4/5 LLE Sensory: Intact to FT and temp BUE. Absent FT, temp and pressure sensation to RLE. Some pressure sensation preserved to LLE.  Reflexes: 2+ bilateral upper extremities. 0 bilateral patellae and achilles Cerebellar: No ataxia with FNF bilaterally. Unable to perform H-S bilaterally.  Gait: Deferred. Walks with crutches and braces.    Results for orders placed or performed during the hospital encounter of 12/25/18 (from the past 48 hour(s))  Comprehensive metabolic panel     Status: Abnormal   Collection Time: 12/25/18  4:23 PM  Result Value Ref Range   Sodium 139 135 - 145 mmol/L   Potassium 3.6 3.5 - 5.1 mmol/L   Chloride 101 98 - 111 mmol/L   CO2 27 22 - 32 mmol/L   Glucose, Bld 115 (H) 70 - 99 mg/dL   BUN 9 6 - 20 mg/dL   Creatinine, Ser 1.610.86 0.61 - 1.24 mg/dL   Calcium 9.5 8.9 - 09.610.3 mg/dL   Total Protein 7.0 6.5 - 8.1 g/dL   Albumin 3.8 3.5 - 5.0 g/dL   AST 18 15 - 41 U/L   ALT 24 0 - 44 U/L   Alkaline Phosphatase 60 38 - 126 U/L   Total Bilirubin 0.6 0.3 - 1.2 mg/dL  GFR calc non Af Amer >60 >60 mL/min   GFR calc Af Amer >60 >60 mL/min   Anion gap 11 5 - 15    Comment: Performed at [facility] [address], [place], [place] [address]  CBC with Differential     Status: Abnormal   Collection Time: 12/25/18  4:23 PM  Result Value Ref Range   WBC 13.2 (H) 4.0 - 10.5 K/uL   RBC 4.91 4.22 - 5.81 MIL/uL   Hemoglobin 15.7 13.0 - 17.0 g/dL   HCT 44.6 39.0 - 52.0 %   MCV 90.8 80.0 - 100.0 fL   MCH 32.0 26.0 - 34.0 pg   MCHC 35.2 30.0 - 36.0 g/dL   RDW 13.9 11.5 - 15.5  %   Platelets 248 150 - 400 K/uL   nRBC 0.0 0.0 - 0.2 %   Neutrophils Relative % 88 %   Neutro Abs 11.6 (H) 1.7 - 7.7 K/uL   Lymphocytes Relative 6 %   Lymphs Abs 0.8 0.7 - 4.0 K/uL   Monocytes Relative 6 %   Monocytes Absolute 0.8 0.1 - 1.0 K/uL   Eosinophils Relative 0 %   Eosinophils Absolute 0.0 0.0 - 0.5 K/uL   Basophils Relative 0 %   Basophils Absolute 0.0 0.0 - 0.1 K/uL   Immature Granulocytes 0 %   Abs Immature Granulocytes 0.03 0.00 - 0.07 K/uL    Comment: Performed at [facility] [address], [place], [place] [address]  Lipase, blood     Status: None   Collection Time: 12/25/18  4:23 PM  Result Value Ref Range   Lipase 41 11 - 51 U/L    Comment: Performed at [name] [address], [place], [place] [address]  Urinalysis, Routine w reflex microscopic     Status: Abnormal   Collection Time: 12/25/18  4:23 PM  Result Value Ref Range   Color, Urine YELLOW YELLOW   APPearance CLEAR CLEAR   Specific Gravity, Urine 1.023 1.005 - 1.030   pH 5.0 5.0 - 8.0   Glucose, UA NEGATIVE NEGATIVE mg/dL   Hgb urine dipstick NEGATIVE NEGATIVE   Bilirubin Urine NEGATIVE NEGATIVE   Ketones, ur 5 (A) NEGATIVE mg/dL   Protein, ur NEGATIVE NEGATIVE mg/dL   Nitrite NEGATIVE NEGATIVE   Leukocytes,Ua NEGATIVE NEGATIVE    Comment: Performed at [facility] [address] [place], [place] [address]   Dg Skull 1-3 Views  Result Date: 12/25/2018 CLINICAL DATA:  Diarrhea. Not feeling well. Evaluate for shunt malfunction. EXAM: SKULL - 1-3 VIEW COMPARISON:  None. FINDINGS: Right parietal approach ventricular shunt catheter terminates posteriorly in the midline. Expected shunt discontinuity overlying right posteroinferior calvarium. Otherwise no evidence of shunt kink or discontinuity in the head or neck. Smooth prominence of the sagittal and coronal sutures. No fractures or focal osseous lesions. IMPRESSION: Right parietal approach ventricular shunt catheter terminates  posteriorly in the midline. No unexpected shunt kink or discontinuity in the head or neck. Electronically Signed   By: Ilona Sorrel M.D.   On: 12/25/2018 17:30   Dg Chest 1 View  Result Date: 12/25/2018 CLINICAL DATA:  Evaluate for shunt malfunction. EXAM: CHEST  1 VIEW COMPARISON:  None. FINDINGS: Low lung volumes. Top-normal heart size. Normal mediastinal contour. No pneumothorax. No pleural effusion. Lungs appear clear, with no acute consolidative airspace disease and no pulmonary edema. Right-sided VP shunt catheter courses inferiorly from the right neck into the medial right chest, and  is not clearly visualized below the level of the T6 level. IMPRESSION: Right-sided VP shunt catheter courses inferiorly from the right neck into the medial right chest, and is not clearly visualized below the level of the T6 level, concerning for shunt catheter fracture. Please see the separate concurrent abdominal radiograph report for further details. A CT of the chest, abdomen and pelvis could be obtained to confirm this finding, as clinically warranted. Electronically Signed   By: Delbert Phenix M.D.   On: 12/25/2018 17:35   Dg Abd 1 View  Result Date: 12/25/2018 CLINICAL DATA:  Evaluate for shunt malfunction. EXAM: ABDOMEN - 1 VIEW COMPARISON:  12/02/2016 CT abdomen/pelvis. FINDINGS: There is a fractured shunt catheter fragment coiled in the deep pelvis, as seen on 12/02/2016 CT abdomen/pelvis, which does not communicate with the shunt catheter in the right neck and upper chest. No dilated small bowel loops. No evidence of pneumatosis or pneumoperitoneum. No radiopaque nephrolithiasis. Sequela of congenital right-sided femoroacetabular dysplasia. Partially visualized fixation hardware in lateral proximal right femur. IMPRESSION: Fractured shunt catheter fragment coiled in the deep pelvis, which does not communicate with the shunt catheter in the right neck and upper chest. Nonobstructive bowel gas pattern.  Electronically Signed   By: Delbert Phenix M.D.   On: 12/25/2018 17:39   Ct Head Wo Contrast  Result Date: 12/25/2018 CLINICAL DATA:  53 year old male with headache and vomiting. History of VP shunt. EXAM: CT HEAD WITHOUT CONTRAST TECHNIQUE: Contiguous axial images were obtained from the base of the skull through the vertex without intravenous contrast. COMPARISON:  01/07/2009 CT FINDINGS: Brain: A 3 cm LEFT cerebellar hematoma is noted with adjacent edema. A RIGHT posterior parietal ventriculostomy catheter is again noted with tip at the posterior midline. Severe hydrocephalus has slightly increased from 2010. Minimal chronic changes in the frontal regions bilaterally noted. Vascular: No hyperdense vessel or unexpected calcification. Skull: No acute abnormality Sinuses/Orbits: No acute abnormality. Other: None IMPRESSION: 1. 3 cm LEFT cerebellar hematoma. 2. Severe hydrocephalus, slightly increased since 2010. Unchanged RIGHT posterior parietal ventriculostomy catheter with tip at the midline. Critical Value/emergent results were called by telephone at the time of interpretation on 12/25/2018 at 4:50 pm to Dr. Shawna Orleans BELFI , who verbally acknowledged these results. Electronically Signed   By: Harmon Pier M.D.   On: 12/25/2018 16:50     Assessment: 53 year old male with spina bifida and subacute left cerebellar hemorrhage. 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  1. Exam findings consistent with chronic deficits secondary to spina bifida in conjunction with scanning speech pattern with some dysarthria, secondary to subacute left cerebellar ICH.  2. 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 Admit to 4N ICU with post-hemorrhage order set to include frequent neuro checks and BP management with SBP goal of < 140 with clevidipine gtt and PRN labetalol 2. No antiplatelet meds or anticoagulants. DVT prophylaxis with SCDs 3. Straight caths q4h for flaccid  neurogenic bladder.  4. MRI brain with contrast 5. Repeat CT head in 24 hours 6. Neurosurgery has been consulted to evaluate regarding cerebellar hemorrhage and possible need for new shunt placement at some time in the near future given new onset headaches and increased ventriculomegaly on CT since 2010 7. PT/O/Speech 8. CTA of head and neck to assess for possible aneurysm or occlusion  45 minutes of time spent in the neurological evaluation and management of this critically ill patient.    Electronically signed: Dr. Caryl Pina  12/25/2018, 7:15 PM

## 2018-12-25 NOTE — ED Notes (Signed)
Report given to [place], Writer. Patient currently having CT/MRI scan done.

## 2018-12-25 NOTE — ED Provider Notes (Addendum)
[facility] EMERGENCY DEPARTMENT Provider Note   CSN: [id number] Arrival date & time: 12/25/18  1448     History   Chief Complaint Chief Complaint  Patient presents with  . Nausea  . Weakness    HPI Jorrell Kuster is a 53 y.o. male.     Patient is a 53 year old male with a history of spina bifida who presents with vomiting and diarrhea.  He states that he has had a 4 to 5-day history of vomiting and diarrhea.  He states that the diarrhea started first and is watery in nature.  The next day he started having nausea and vomiting.  He has had some ongoing nausea and vomiting since that time.  He also has a diffuse headache.  It started more on the right side around where his VP shunt is but now it is more diffuse.  He has some dizziness which she describes more as a lightheadedness when he bends over.  He denies any vertiginous type symptoms.  He denies any neck pain.  No fevers.  No associated abdominal pain.  No chest pain or shortness of breath.  He has been seen recently to other emergency departments and was told at one time he had a UTI and is currently on Bactrim.  He was given some potassium replacement.  He states he has not had any improvement in symptoms.     Past Medical History:  Diagnosis Date  . Self-catheterizes urinary bladder   . Spina bifida ([place])     There are no active problems to display for this patient.   Past Surgical History:  Procedure Laterality Date  . CSF SHUNT  1969        Home Medications    Prior to Admission medications   Not on File    Family History History reviewed. No pertinent family history.  Social History Social History   Tobacco Use  . Smoking status: Never Smoker  . Smokeless tobacco: Never Used  Substance Use Topics  . Alcohol use: Not Currently  . Drug use: Never     Allergies   Latex and Penicillins   Review of Systems Review of Systems  Constitutional: Positive for fatigue. Negative  for chills, diaphoresis and fever.  HENT: Negative for congestion, rhinorrhea and sneezing.   Eyes: Negative.   Respiratory: Negative for cough, chest tightness and shortness of breath.   Cardiovascular: Negative for chest pain and leg swelling.  Gastrointestinal: Positive for diarrhea, nausea and vomiting. Negative for abdominal pain and blood in stool.  Genitourinary: Negative for difficulty urinating, flank pain, frequency and hematuria.  Musculoskeletal: Negative for arthralgias and back pain.  Skin: Negative for rash.  Neurological: Positive for headaches. Negative for dizziness, speech difficulty, weakness and numbness.     Physical Exam Updated Vital Signs BP (!) 151/72   Pulse 63   Temp 99.5 F (37.5 C) (Oral)   Resp 17   Ht 4\' 9"  (1.448 m)   Wt 70.8 kg   SpO2 98%   BMI 33.76 kg/m   Physical Exam Constitutional:      Appearance: He is well-developed.  HENT:     Head: Normocephalic and atraumatic.  Eyes:     Pupils: Pupils are equal, round, and reactive to light.     Comments: Positive nystagmus, both horizontal and rotational  Neck:     Musculoskeletal: Normal range of motion and neck supple.     Comments: No meningismus Cardiovascular:     Rate and  Rhythm: Normal rate and regular rhythm.     Heart sounds: Normal heart sounds.  Pulmonary:     Effort: Pulmonary effort is normal. No respiratory distress.     Breath sounds: Normal breath sounds. No wheezing or rales.  Chest:     Chest wall: No tenderness.  Abdominal:     General: Bowel sounds are normal.     Palpations: Abdomen is soft.     Tenderness: There is no abdominal tenderness. There is no guarding or rebound.  Musculoskeletal: Normal range of motion.  Lymphadenopathy:     Cervical: No cervical adenopathy.  Skin:    General: Skin is warm and dry.     Findings: No rash.  Neurological:     Mental Status: He is alert and oriented to person, place, and time.     Comments: Patient has equal motor  strength in his upper extremities.  He is unable to lift his lower extremities off the bed which he says is chronic for him.  He has braces in place.  He has normal finger-to-nose.  No facial drooping.  No other cranial nerve deficit noted.      ED Treatments / Results  Labs (all labs ordered are listed, but only abnormal results are displayed) Labs Reviewed  COMPREHENSIVE METABOLIC PANEL - Abnormal; Notable for the following components:      Result Value   Glucose, Bld 115 (*)    All other components within normal limits  CBC WITH DIFFERENTIAL/PLATELET - Abnormal; Notable for the following components:   WBC 13.2 (*)    Neutro Abs 11.6 (*)    All other components within normal limits  URINALYSIS, ROUTINE W REFLEX MICROSCOPIC - Abnormal; Notable for the following components:   Ketones, ur 5 (*)    All other components within normal limits  SARS CORONAVIRUS 2 (TAT 6-12 HRS)  LIPASE, BLOOD    EKG None  Radiology Dg Skull 1-3 Views  Result Date: 12/25/2018 CLINICAL DATA:  Diarrhea. Not feeling well. Evaluate for shunt malfunction. EXAM: SKULL - 1-3 VIEW COMPARISON:  None. FINDINGS: Right parietal approach ventricular shunt catheter terminates posteriorly in the midline. Expected shunt discontinuity overlying right posteroinferior calvarium. Otherwise no evidence of shunt kink or discontinuity in the head or neck. Smooth prominence of the sagittal and coronal sutures. No fractures or focal osseous lesions. IMPRESSION: Right parietal approach ventricular shunt catheter terminates posteriorly in the midline. No unexpected shunt kink or discontinuity in the head or neck. Electronically Signed   By: Delbert Phenix M.D.   On: 12/25/2018 17:30   Dg Chest 1 View  Result Date: 12/25/2018 CLINICAL DATA:  Evaluate for shunt malfunction. EXAM: CHEST  1 VIEW COMPARISON:  None. FINDINGS: Low lung volumes. Top-normal heart size. Normal mediastinal contour. No pneumothorax. No pleural effusion. Lungs  appear clear, with no acute consolidative airspace disease and no pulmonary edema. Right-sided VP shunt catheter courses inferiorly from the right neck into the medial right chest, and is not clearly visualized below the level of the T6 level. IMPRESSION: Right-sided VP shunt catheter courses inferiorly from the right neck into the medial right chest, and is not clearly visualized below the level of the T6 level, concerning for shunt catheter fracture. Please see the separate concurrent abdominal radiograph report for further details. A CT of the chest, abdomen and pelvis could be obtained to confirm this finding, as clinically warranted. Electronically Signed   By: Delbert Phenix M.D.   On: 12/25/2018 17:35   Dg Abd  1 View  Result Date: 12/25/2018 CLINICAL DATA:  Evaluate for shunt malfunction. EXAM: ABDOMEN - 1 VIEW COMPARISON:  12/02/2016 CT abdomen/pelvis. FINDINGS: There is a fractured shunt catheter fragment coiled in the deep pelvis, as seen on 12/02/2016 CT abdomen/pelvis, which does not communicate with the shunt catheter in the right neck and upper chest. No dilated small bowel loops. No evidence of pneumatosis or pneumoperitoneum. No radiopaque nephrolithiasis. Sequela of congenital right-sided femoroacetabular dysplasia. Partially visualized fixation hardware in lateral proximal right femur. IMPRESSION: Fractured shunt catheter fragment coiled in the deep pelvis, which does not communicate with the shunt catheter in the right neck and upper chest. Nonobstructive bowel gas pattern. Electronically Signed   By: Delbert PhenixJason A Poff M.D.   On: 12/25/2018 17:39   Ct Head Wo Contrast  Result Date: 12/25/2018 CLINICAL DATA:  53 year old male with headache and vomiting. History of VP shunt. EXAM: CT HEAD WITHOUT CONTRAST TECHNIQUE: Contiguous axial images were obtained from the base of the skull through the vertex without intravenous contrast. COMPARISON:  01/07/2009 CT FINDINGS: Brain: A 3 cm LEFT cerebellar  hematoma is noted with adjacent edema. A RIGHT posterior parietal ventriculostomy catheter is again noted with tip at the posterior midline. Severe hydrocephalus has slightly increased from 2010. Minimal chronic changes in the frontal regions bilaterally noted. Vascular: No hyperdense vessel or unexpected calcification. Skull: No acute abnormality Sinuses/Orbits: No acute abnormality. Other: None IMPRESSION: 1. 3 cm LEFT cerebellar hematoma. 2. Severe hydrocephalus, slightly increased since 2010. Unchanged RIGHT posterior parietal ventriculostomy catheter with tip at the midline. Critical Value/emergent results were called by telephone at the time of interpretation on 12/25/2018 at 4:50 pm to Dr. Shawna OrleansMELANIE Lylah Lantis , who verbally acknowledged these results. Electronically Signed   By: Harmon PierJeffrey  Hu M.D.   On: 12/25/2018 16:50    Procedures Procedures (including critical care time)  Medications Ordered in ED Medications  sodium chloride 0.9 % bolus 500 mL (500 mLs Intravenous New Bag/Given 12/25/18 1615)  ondansetron (ZOFRAN) injection 4 mg (4 mg Intravenous Given 12/25/18 1615)     Initial Impression / Assessment and Plan / ED Course  I have reviewed the triage vital signs and the nursing notes.  Pertinent labs & imaging results that were available during my care of the patient were reviewed by me and considered in my medical decision making (see chart for details).        Patient is a 53 year old male who presents with nausea and vomiting associated with worsening headache.  CT scan shows 3 cm cerebellar hematoma.  He is alert and oriented with current stable vital signs.  He takes a baby aspirin a day but is not on other anticoagulants.  I spoke to Dr. Danielle DessElsner with neurosurgery who will see the patient.  He feels that the patient be most appropriate for a neurology admission.  Patient has not had any recent falls per his report.  I spoke with Dr. Otelia LimesLindzen with neurology who will admit the patient for  further treatment.  CRITICAL CARE Performed by: Rolan BuccoMelanie Cambell Stanek Total critical care time: 60 minutes Critical care time was exclusive of separately billable procedures and treating other patients. Critical care was necessary to treat or prevent imminent or life-threatening deterioration. Critical care was time spent personally by me on the following activities: development of treatment plan with patient and/or surrogate as well as nursing, discussions with consultants, evaluation of patient's response to treatment, examination of patient, obtaining history from patient or surrogate, ordering and performing treatments and interventions, ordering  and review of laboratory studies, ordering and review of radiographic studies, pulse oximetry and re-evaluation of patient's condition.    Pt's BP is starting to rise.  Dr. Otelia LimesLindzen is seeing the pt.  He is ordering meds for BP lowering.   Final Clinical Impressions(s) / ED Diagnoses   Final diagnoses:  Cerebellar bleed [facility](HCC)    ED Discharge Orders    None       Rolan BuccoBelfi, Finneas Mathe, MD 12/25/18 1815    Rolan BuccoBelfi, Shandria Clinch, MD 12/25/18 [phone]

## 2018-12-25 NOTE — ED Triage Notes (Addendum)
Patient arrived via POV due to not feeling weel x1 week. Per pts father, patient started having diarrhea 1 week ago, that lasted the entire Monday. Patient went to ER at [facility] and was treated for a UTI. Did not improve when sent home. Patient then had an ongoing headache and n/v. Went to [facility] on Wednesday-8/26 to be tested for COVID. Patient tested negative. Patient then went back to [facility] on Sat-8/29 and was treated for nausea and given potassium.   Pt came in today due ongoing nausea, headache, and weakness.

## 2018-12-25 NOTE — Consult Note (Signed)
Reason for Consult: History of hydrocephalus with ventriculoperitoneal shunting, cerebellar hemorrhage Referring Physician: Dr. Caryl Pina  Carlos Ferguson is an 53 y.o. male.  HPI: Patient is a 53 year old male who was born with spina bifida and hydrocephalus.  He was treated years ago by Dr. Ardean Larsen in [place] and Dr. Eligha Bridegroom had placed and maintained a ventriculoperitoneal shunt.  The patient appears to be aware that his shunt had been nonfunctioning for some time with a broken catheter.  He notes for the last for 5 days he started feeling headachy sick on his stomach and his speech had changed he has been able to communicate but he is felt nauseous and vomited on several occasions.  Came to the emergency room today and a CT scan demonstrated presence of a left cerebellar hematoma that measures approximately 3 cm in diameter.  Patient has had ventriculomegaly which may be slightly worse than it was in 2010.  Patient's level of consciousness has been good.  Past Medical History:  Diagnosis Date  . Self-catheterizes urinary bladder   . Spina bifida [facility])     Past Surgical History:  Procedure Laterality Date  . CSF SHUNT  1969    History reviewed. No pertinent family history.  Social History:  reports that he has never smoked. He has never used smokeless tobacco. He reports previous alcohol use. He reports that he does not use drugs.  Allergies:  Allergies  Allergen Reactions  . Latex Anaphylaxis and Hives  . Penicillins Hives and Itching    Medications: I have reviewed the patient's current medications.  Results for orders placed or performed during the hospital encounter of 12/25/18 (from the past 48 hour(s))  Comprehensive metabolic panel     Status: Abnormal   Collection Time: 12/25/18  4:23 PM  Result Value Ref Range   Sodium 139 135 - 145 mmol/L   Potassium 3.6 3.5 - 5.1 mmol/L   Chloride 101 98 - 111 mmol/L   CO2 27 22 - 32 mmol/L   Glucose, Bld 115 (H) 70 - 99  mg/dL   BUN 9 6 - 20 mg/dL   Creatinine, Ser 7.54 0.61 - 1.24 mg/dL   Calcium 9.5 8.9 - 49.2 mg/dL   Total Protein 7.0 6.5 - 8.1 g/dL   Albumin 3.8 3.5 - 5.0 g/dL   AST 18 15 - 41 U/L   ALT 24 0 - 44 U/L   Alkaline Phosphatase 60 38 - 126 U/L   Total Bilirubin 0.6 0.3 - 1.2 mg/dL   GFR calc non Af Amer >60 >60 mL/min   GFR calc Af Amer >60 >60 mL/min   Anion gap 11 5 - 15    Comment: Performed at [facility], [address] [place], [place] [address]  CBC with Differential     Status: Abnormal   Collection Time: 12/25/18  4:23 PM  Result Value Ref Range   WBC 13.2 (H) 4.0 - 10.5 K/uL   RBC 4.91 4.22 - 5.81 MIL/uL   Hemoglobin 15.7 13.0 - 17.0 g/dL   HCT 12.1 97.5 - 88.3 %   MCV 90.8 80.0 - 100.0 fL   MCH 32.0 26.0 - 34.0 pg   MCHC 35.2 30.0 - 36.0 g/dL   RDW 25.4 98.2 - 64.1 %   Platelets 248 150 - 400 K/uL   nRBC 0.0 0.0 - 0.2 %   Neutrophils Relative % 88 %   Neutro Abs 11.6 (H) 1.7 - 7.7 K/uL   Lymphocytes Relative 6 %  Lymphs Abs 0.8 0.7 - 4.0 K/uL   Monocytes Relative 6 %   Monocytes Absolute 0.8 0.1 - 1.0 K/uL   Eosinophils Relative 0 %   Eosinophils Absolute 0.0 0.0 - 0.5 K/uL   Basophils Relative 0 %   Basophils Absolute 0.0 0.0 - 0.1 K/uL   Immature Granulocytes 0 %   Abs Immature Granulocytes 0.03 0.00 - 0.07 K/uL    Comment: Performed at [facility], [address] [place], [place] [address]  Lipase, blood     Status: None   Collection Time: 12/25/18  4:23 PM  Result Value Ref Range   Lipase 41 11 - 51 U/L    Comment: Performed at [facility], [address] [place], [place] [address]  Urinalysis, Routine w reflex microscopic     Status: Abnormal   Collection Time: 12/25/18  4:23 PM  Result Value Ref Range   Color, Urine YELLOW YELLOW   APPearance CLEAR CLEAR   Specific Gravity, Urine 1.023 1.005 - 1.030   pH 5.0 5.0 - 8.0   Glucose, UA NEGATIVE NEGATIVE mg/dL   Hgb urine dipstick NEGATIVE NEGATIVE   Bilirubin Urine NEGATIVE  NEGATIVE   Ketones, ur 5 (A) NEGATIVE mg/dL   Protein, ur NEGATIVE NEGATIVE mg/dL   Nitrite NEGATIVE NEGATIVE   Leukocytes,Ua NEGATIVE NEGATIVE    Comment: Performed at [facility], [address] [place], [place] [address]    Ct Angio Head W Or Wo Contrast  Result Date: 12/25/2018 CLINICAL DATA:  Follow-up examination for acute intracranial hemorrhage, no 3 cm left cerebellar hematoma. History of hydrocephalus with VP shunt in place. EXAM: CT ANGIOGRAPHY HEAD AND NECK TECHNIQUE: Multidetector CT imaging of the head and neck was performed using the standard protocol during bolus administration of intravenous contrast. Multiplanar CT image reconstructions and MIPs were obtained to evaluate the vascular anatomy. Carotid stenosis measurements (when applicable) are obtained utilizing NASCET criteria, using the distal internal carotid diameter as the denominator. CONTRAST:  80mL OMNIPAQUE IOHEXOL 350 MG/ML SOLN COMPARISON:  Prior head CT from earlier same day. FINDINGS: CTA NECK FINDINGS Aortic arch: Visualized aortic arch of normal caliber with normal 3 vessel morphology. Minor atherosclerotic change seen about the origin of the great vessels without hemodynamically significant stenosis. Visualized subclavian arteries widely patent. Right carotid system: Right common and internal carotid arteries widely patent without stenosis, dissection or occlusion. No significant atheromatous narrowing about the right carotid bifurcation. Left carotid system: Left common and internal carotid arteries widely patent without stenosis, dissection, or occlusion. Minimal calcified plaque about the left bifurcation without hemodynamically significant stenosis. Vertebral arteries: Both vertebral arteries are somewhat diminutive in arise from the subclavian arteries bilaterally. Vertebral arteries widely patent within the neck without stenosis, dissection, or occlusion. Skeleton: No acute osseous abnormality. No discrete  lytic or blastic osseous lesions. Other neck: No acute soft tissue abnormality within the neck. Salivary glands within normal limits. No adenopathy. Few scattered hypodense thyroid nodules noted, largest of which measures 1 cm, of doubtful significance given size and patient age. Upper chest: Visualized upper chest demonstrates no acute finding. Review of the MIP images confirms the above findings CTA HEAD FINDINGS Anterior circulation: Internal carotid arteries widely patent to the termini without stenosis or other abnormality. Left A1 widely patent. Right A1 hypoplastic and/or absent, accounting for the slightly diminutive right ICA is compared to the left. Normal anterior communicating artery. Anterior cerebral arteries widely patent to their distal aspects without stenosis. No M1  stenosis or occlusion. Negative MCA bifurcations. Distal MCA branches well perfused and symmetric. Posterior circulation: Vertebral arteries widely patent to the vertebrobasilar junction without stenosis. Left vertebral artery slightly dominant. Posteroinferior cerebral arteries not well seen. Basilar diminutive but widely patent to its distal aspect. Superior cerebral arteries patent bilaterally. Predominant fetal type origin of the PCAs bilaterally. Both PCAs well perfused to their distal aspects without stenosis. Venous sinuses: Grossly patent allowing for timing of the contrast bolus. Anatomic variants: No aneurysm or other vascular abnormality seen underlying the left cerebellar hematoma. No intracranial aneurysm at elsewhere within the intracranial circulation. Hypoplastic/absent right A1 with the anterior cerebral artery supplied via the left carotid artery system. Predominant fetal type origin of the PCAs with overall diminutive vertebrobasilar system. Review of the MIP images confirms the above findings IMPRESSION: 1. Negative CTA of the head and neck. No aneurysm or other vascular abnormality seen underlying the left  cerebellar hematoma. 2. Minor atherosclerotic change for patient age about the aortic arch and left carotid bifurcation. No large vessel occlusion or hemodynamically significant stenosis. 3. Predominant fetal type origin of the PCAs with overall diminutive vertebrobasilar system. 4. Hypoplastic/absent right A1, with the anterior cerebral artery supplied via the left carotid artery system. Electronically Signed   By: Jeannine Boga M.D.   On: 12/25/2018 20:52   Dg Skull 1-3 Views  Result Date: 12/25/2018 CLINICAL DATA:  Diarrhea. Not feeling well. Evaluate for shunt malfunction. EXAM: SKULL - 1-3 VIEW COMPARISON:  None. FINDINGS: Right parietal approach ventricular shunt catheter terminates posteriorly in the midline. Expected shunt discontinuity overlying right posteroinferior calvarium. Otherwise no evidence of shunt kink or discontinuity in the head or neck. Smooth prominence of the sagittal and coronal sutures. No fractures or focal osseous lesions. IMPRESSION: Right parietal approach ventricular shunt catheter terminates posteriorly in the midline. No unexpected shunt kink or discontinuity in the head or neck. Electronically Signed   By: Ilona Sorrel M.D.   On: 12/25/2018 17:30   Dg Chest 1 View  Result Date: 12/25/2018 CLINICAL DATA:  Evaluate for shunt malfunction. EXAM: CHEST  1 VIEW COMPARISON:  None. FINDINGS: Low lung volumes. Top-normal heart size. Normal mediastinal contour. No pneumothorax. No pleural effusion. Lungs appear clear, with no acute consolidative airspace disease and no pulmonary edema. Right-sided VP shunt catheter courses inferiorly from the right neck into the medial right chest, and is not clearly visualized below the level of the T6 level. IMPRESSION: Right-sided VP shunt catheter courses inferiorly from the right neck into the medial right chest, and is not clearly visualized below the level of the T6 level, concerning for shunt catheter fracture. Please see the separate  concurrent abdominal radiograph report for further details. A CT of the chest, abdomen and pelvis could be obtained to confirm this finding, as clinically warranted. Electronically Signed   By: Ilona Sorrel M.D.   On: 12/25/2018 17:35   Chest 2 View  Result Date: 12/25/2018 CLINICAL DATA:  Intracranial hemorrhage. EXAM: CHEST - 2 VIEW COMPARISON:  12/25/2018 radiographs FINDINGS: This is a low volume film. UPPER limits normal heart size noted. There is no evidence of focal airspace disease, pulmonary edema, suspicious pulmonary nodule/mass, pleural effusion, or pneumothorax. No acute bony abnormalities are identified. A VP shunt catheter is again noted and unchanged in appearance. IMPRESSION: No evidence of acute cardiopulmonary disease. Electronically Signed   By: Margarette Canada M.D.   On: 12/25/2018 20:28   Dg Abd 1 View  Result Date: 12/25/2018 CLINICAL DATA:  Evaluate  for shunt malfunction. EXAM: ABDOMEN - 1 VIEW COMPARISON:  12/02/2016 CT abdomen/pelvis. FINDINGS: There is a fractured shunt catheter fragment coiled in the deep pelvis, as seen on 12/02/2016 CT abdomen/pelvis, which does not communicate with the shunt catheter in the right neck and upper chest. No dilated small bowel loops. No evidence of pneumatosis or pneumoperitoneum. No radiopaque nephrolithiasis. Sequela of congenital right-sided femoroacetabular dysplasia. Partially visualized fixation hardware in lateral proximal right femur. IMPRESSION: Fractured shunt catheter fragment coiled in the deep pelvis, which does not communicate with the shunt catheter in the right neck and upper chest. Nonobstructive bowel gas pattern. Electronically Signed   By: Delbert PhenixJason A Poff M.D.   On: 12/25/2018 17:39   Ct Head Wo Contrast  Result Date: 12/25/2018 CLINICAL DATA:  53 year old male with headache and vomiting. History of VP shunt. EXAM: CT HEAD WITHOUT CONTRAST TECHNIQUE: Contiguous axial images were obtained from the base of the skull through the  vertex without intravenous contrast. COMPARISON:  01/07/2009 CT FINDINGS: Brain: A 3 cm LEFT cerebellar hematoma is noted with adjacent edema. A RIGHT posterior parietal ventriculostomy catheter is again noted with tip at the posterior midline. Severe hydrocephalus has slightly increased from 2010. Minimal chronic changes in the frontal regions bilaterally noted. Vascular: No hyperdense vessel or unexpected calcification. Skull: No acute abnormality Sinuses/Orbits: No acute abnormality. Other: None IMPRESSION: 1. 3 cm LEFT cerebellar hematoma. 2. Severe hydrocephalus, slightly increased since 2010. Unchanged RIGHT posterior parietal ventriculostomy catheter with tip at the midline. Critical Value/emergent results were called by telephone at the time of interpretation on 12/25/2018 at 4:50 pm to Dr. Shawna OrleansMELANIE BELFI , who verbally acknowledged these results. Electronically Signed   By: Harmon PierJeffrey  Hu M.D.   On: 12/25/2018 16:50   Ct Angio Neck W Or Wo Contrast  Result Date: 12/25/2018 CLINICAL DATA:  Follow-up examination for acute intracranial hemorrhage, no 3 cm left cerebellar hematoma. History of hydrocephalus with VP shunt in place. EXAM: CT ANGIOGRAPHY HEAD AND NECK TECHNIQUE: Multidetector CT imaging of the head and neck was performed using the standard protocol during bolus administration of intravenous contrast. Multiplanar CT image reconstructions and MIPs were obtained to evaluate the vascular anatomy. Carotid stenosis measurements (when applicable) are obtained utilizing NASCET criteria, using the distal internal carotid diameter as the denominator. CONTRAST:  80mL OMNIPAQUE IOHEXOL 350 MG/ML SOLN COMPARISON:  Prior head CT from earlier same day. FINDINGS: CTA NECK FINDINGS Aortic arch: Visualized aortic arch of normal caliber with normal 3 vessel morphology. Minor atherosclerotic change seen about the origin of the great vessels without hemodynamically significant stenosis. Visualized subclavian arteries  widely patent. Right carotid system: Right common and internal carotid arteries widely patent without stenosis, dissection or occlusion. No significant atheromatous narrowing about the right carotid bifurcation. Left carotid system: Left common and internal carotid arteries widely patent without stenosis, dissection, or occlusion. Minimal calcified plaque about the left bifurcation without hemodynamically significant stenosis. Vertebral arteries: Both vertebral arteries are somewhat diminutive in arise from the subclavian arteries bilaterally. Vertebral arteries widely patent within the neck without stenosis, dissection, or occlusion. Skeleton: No acute osseous abnormality. No discrete lytic or blastic osseous lesions. Other neck: No acute soft tissue abnormality within the neck. Salivary glands within normal limits. No adenopathy. Few scattered hypodense thyroid nodules noted, largest of which measures 1 cm, of doubtful significance given size and patient age. Upper chest: Visualized upper chest demonstrates no acute finding. Review of the MIP images confirms the above findings CTA HEAD FINDINGS Anterior circulation: Internal carotid  arteries widely patent to the termini without stenosis or other abnormality. Left A1 widely patent. Right A1 hypoplastic and/or absent, accounting for the slightly diminutive right ICA is compared to the left. Normal anterior communicating artery. Anterior cerebral arteries widely patent to their distal aspects without stenosis. No M1 stenosis or occlusion. Negative MCA bifurcations. Distal MCA branches well perfused and symmetric. Posterior circulation: Vertebral arteries widely patent to the vertebrobasilar junction without stenosis. Left vertebral artery slightly dominant. Posteroinferior cerebral arteries not well seen. Basilar diminutive but widely patent to its distal aspect. Superior cerebral arteries patent bilaterally. Predominant fetal type origin of the PCAs bilaterally.  Both PCAs well perfused to their distal aspects without stenosis. Venous sinuses: Grossly patent allowing for timing of the contrast bolus. Anatomic variants: No aneurysm or other vascular abnormality seen underlying the left cerebellar hematoma. No intracranial aneurysm at elsewhere within the intracranial circulation. Hypoplastic/absent right A1 with the anterior cerebral artery supplied via the left carotid artery system. Predominant fetal type origin of the PCAs with overall diminutive vertebrobasilar system. Review of the MIP images confirms the above findings IMPRESSION: 1. Negative CTA of the head and neck. No aneurysm or other vascular abnormality seen underlying the left cerebellar hematoma. 2. Minor atherosclerotic change for patient age about the aortic arch and left carotid bifurcation. No large vessel occlusion or hemodynamically significant stenosis. 3. Predominant fetal type origin of the PCAs with overall diminutive vertebrobasilar system. 4. Hypoplastic/absent right A1, with the anterior cerebral artery supplied via the left carotid artery system. Electronically Signed   By: Rise MuBenjamin  McClintock M.D.   On: 12/25/2018 20:52    Review of Systems  Constitutional: Positive for malaise/fatigue.  HENT: Negative.   Eyes: Negative.   Cardiovascular: Negative.   Gastrointestinal: Positive for nausea.  Genitourinary: Negative.   Musculoskeletal: Negative.   Skin: Negative.   Neurological: Positive for speech change and headaches.  Endo/Heme/Allergies: Negative.   Psychiatric/Behavioral: Negative.    Blood pressure (!) 159/74, pulse 64, temperature 98.5 F (36.9 C), temperature source Oral, resp. rate 18, height 4\' 9"  (1.448 m), weight 70.8 kg, SpO2 98 %. Physical Exam  Constitutional: He is oriented to person, place, and time. He appears well-developed and well-nourished.  HENT:  Right parieto-occipital shunt is easily palpable.  Shunt presses down hard and does not refill briskly.  The  head is elongated in the anterior posterior direction  Eyes: Pupils are equal, round, and reactive to light. Conjunctivae and EOM are normal.  Neck: Normal range of motion. Neck supple.  Respiratory: Effort normal and breath sounds normal.  GI: Soft. Bowel sounds are normal.  Musculoskeletal: Normal range of motion.  Neurological: He is alert and oriented to person, place, and time.  Psychiatric: He has a normal mood and affect. His behavior is normal. Judgment and thought content normal.    Assessment/Plan: Left cerebellar hemorrhage in a patient who has apparent compensated hydrocephalus.  If he is not had recent shunt revisions is likely that the shunt has not been working for some time.  As long as his level of consciousness remains good we can watch him clinically.  Should there be a deterioration in his level of consciousness then we may need to consider placement of a ventriculostomy catheter but I would be hard put to suggest either tapping the shunt now to see what his intracranial pressure is or otherwise manipulating the device.  We will follow along with you  Stefani DamaHenry J Pradyun Ishman 12/25/2018, 9:21 PM

## 2018-12-26 ENCOUNTER — Inpatient Hospital Stay (HOSPITAL_COMMUNITY): Payer: Medicare Other

## 2018-12-26 DIAGNOSIS — I614 Nontraumatic intracerebral hemorrhage in cerebellum: Secondary | ICD-10-CM

## 2018-12-26 LAB — ECHOCARDIOGRAM COMPLETE
Height: 57 in
Weight: 2303.37 oz

## 2018-12-26 LAB — SARS CORONAVIRUS 2 (TAT 6-24 HRS): SARS Coronavirus 2: NEGATIVE

## 2018-12-26 LAB — HIV ANTIBODY (ROUTINE TESTING W REFLEX): HIV Screen 4th Generation wRfx: NONREACTIVE

## 2018-12-26 LAB — MRSA PCR SCREENING: MRSA by PCR: NEGATIVE

## 2018-12-26 MED ORDER — PANTOPRAZOLE SODIUM 40 MG PO TBEC
40.0000 mg | DELAYED_RELEASE_TABLET | Freq: Every day | ORAL | Status: DC
  Administered 2018-12-26 – 2018-12-27 (×2): 40 mg via ORAL
  Filled 2018-12-26 (×2): qty 1

## 2018-12-26 MED ORDER — HYDROCORTISONE 1 % EX CREA
TOPICAL_CREAM | Freq: Two times a day (BID) | CUTANEOUS | Status: DC
  Filled 2018-12-26: qty 28

## 2018-12-26 MED ORDER — LOPERAMIDE HCL 2 MG PO CAPS
4.0000 mg | ORAL_CAPSULE | Freq: Once | ORAL | Status: AC
  Administered 2018-12-26: 4 mg via ORAL
  Filled 2018-12-26: qty 2

## 2018-12-26 MED ORDER — NYSTATIN 100000 UNIT/GM EX POWD
Freq: Two times a day (BID) | CUTANEOUS | Status: DC
  Administered 2018-12-26 – 2018-12-28 (×5): via TOPICAL
  Filled 2018-12-26: qty 15

## 2018-12-26 MED ORDER — LABETALOL HCL 5 MG/ML IV SOLN
20.0000 mg | INTRAVENOUS | Status: DC | PRN
  Administered 2018-12-26 – 2018-12-28 (×5): 20 mg via INTRAVENOUS
  Filled 2018-12-26 (×5): qty 4

## 2018-12-26 NOTE — Consult Note (Signed)
[place] Nurse wound consult note Patient receiving care in [place] Reason for Consult: rash on legs Wound type: Large patch of red fungal rash on posterior right thigh/hip and RLE with satellite lesions MD had already ordered Nystatin powder 2 times daily.  Please continue this therapy. Monitor the wound area(s) for worsening of condition such as: Signs/symptoms of infection,  Increase in size,  Development of or worsening of odor, Development of pain, or increased pain at the affected locations.  Notify the medical team if any of these develop.  Thank you for the consult.  Discussed plan of care with the patient and bedside nurse.  [facility] nurse will not follow at this time.  Please re-consult the [facility] team if needed.  Val Riles, RN, MSN, CWOCN, CNS-BC, pager [phone]

## 2018-12-26 NOTE — Progress Notes (Signed)
Verbal order received from MD Sethi SBP can change from <140 to <160 at 24 hours since admission. Will continue to monitor. SBP <160 at this time.

## 2018-12-26 NOTE — Plan of Care (Signed)
  Problem: Clinical Measurements: Goal: Respiratory complications will improve Outcome: Progressing   Problem: Activity: Goal: Risk for activity intolerance will decrease Outcome: Progressing   Problem: Nutrition: Goal: Adequate nutrition will be maintained Outcome: Progressing   Problem: Pain Managment: Goal: General experience of comfort will improve Outcome: Progressing   Problem: Safety: Goal: Ability to remain free from injury will improve Outcome: Progressing   Problem: Skin Integrity: Goal: Risk for impaired skin integrity will decrease Outcome: Progressing   Problem: Education: Goal: Knowledge of disease or condition will improve Outcome: Progressing   Problem: Coping: Goal: Will identify appropriate support needs Outcome: Progressing   Problem: Self-Care: Goal: Ability to participate in self-care as condition permits will improve Outcome: Progressing Goal: Verbalization of feelings and concerns over difficulty with self-care will improve Outcome: Progressing Goal: Ability to communicate needs accurately will improve Outcome: Progressing   Problem: Nutrition: Goal: Risk of aspiration will decrease Outcome: Progressing   Problem: Intracerebral Hemorrhage Tissue Perfusion: Goal: Complications of Intracerebral Hemorrhage will be minimized Outcome: Progressing

## 2018-12-26 NOTE — Evaluation (Signed)
Occupational Therapy Evaluation Patient Details Name: Carlos Ferguson MRN: [id number] DOB: 03/08/1966 Today's Date: 12/26/2018    History of Present Illness Patient is a 53 y/o male who presents with nausea, headache, diarrhea and weakness. 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 PMH includes spina bifida.   Clinical Impression   PTA, pt was living alone and was independent including ADLs, IADLs, and driving; pt used crutches and bilateral braces during mobility. Pt currently requiring Min-Mod for UB ADLs, Mod-Max A for LB ADLs, and Mod A +2 for functional transfers with crutches. Pt presenting with speech deficits, dizziness, nystagmus, poor balance, and decreased strength. Pt very motivated to participate in therapy despite dizziness and pain. BP dropped from 133/70 to 116/67 once EOB. Pt will require further acute OT to facilitate safe dc and increase occupational performance. Due to motivation, age, and PLOF, recommend dc to CIR for intensive OT to optimize safety, independence with ADLs and mobility, and return to PLOF.     Follow Up Recommendations  CIR;Supervision/Assistance - 24 hour    Equipment Recommendations  Other (comment)(Defer to next venue)    Recommendations for Other Services Rehab consult;PT consult;Speech consult     Precautions / Restrictions Precautions Precautions: Fall Precaution Comments: nystagmus and dizziness, watch BP Restrictions Weight Bearing Restrictions: No      Mobility Bed Mobility Overal bed mobility: Needs Assistance Bed Mobility: Rolling;Supine to Sit;Sit to Sidelying Rolling: Mod assist Sidelying to sit: Mod assist;HOB elevated;+2 for safety/equipment     Sit to sidelying: Mod assist General bed mobility comments: Assist to roll to right/left for pericare due to incontinence. Assist to elevate trunkl to get to EOB, dizziness with sitting upright. Assist to bring LEs into bed.  Transfers Overall transfer level: Needs  assistance Equipment used: Crutches Transfers: Sit to/from Stand Sit to Stand: Mod assist;+2 safety/equipment;+2 physical assistance         General transfer comment: Assist of 2 to stand from EOB with seat deflated (pt 4'9), not able to get fully upright. Weakness noted in BLEs.    Balance Overall balance assessment: Needs assistance Sitting-balance support: Feet unsupported;Bilateral upper extremity supported Sitting balance-Leahy Scale: Poor Sitting balance - Comments: varying assist needed, Min guard-Mod A for donning socks, braces and shoes sitting EOB. Dizziness with any change in position Postural control: Posterior lean Standing balance support: During functional activity;Bilateral upper extremity supported Standing balance-Leahy Scale: Poor Standing balance comment: Requires assist of 2 for standing.                           ADL either performed or assessed with clinical judgement   ADL Overall ADL's : Needs assistance/impaired Eating/Feeding: Set up;Bed level   Grooming: Set up;Bed level   Upper Body Bathing: Minimal assistance;Moderate assistance;Sitting   Lower Body Bathing: Maximal assistance;Sit to/from stand;+2 for physical assistance   Upper Body Dressing : Minimal assistance;Moderate assistance;Sitting   Lower Body Dressing: Moderate assistance;Maximal assistance;+2 for physical assistance;Sit to/from stand Lower Body Dressing Details (indicate cue type and reason): Pt donned socks, braces, and shoes with Mod A for sitting balance due to dizziness.  Toilet Transfer: Moderate assistance;+2 for physical assistance(sit<>Stand only at EOB)   Toileting- Clothing Manipulation and Hygiene: Maximal assistance;Bed level       Functional mobility during ADLs: Moderate assistance;+2 for physical assistance;Rolling walker(sit<>stand only at EOB) General ADL Comments: Pt presenting with dizziness,      Vision Baseline Vision/History: Wears glasses Wears  Glasses: Reading only Patient Visual  Report: Other (comment)(Denied double vision, but has nystagmus) Additional Comments: Noting left lateral and upward pulsating nystagmus with sidelying on right side     Perception     Praxis      Pertinent Vitals/Pain Pain Assessment: Faces Faces Pain Scale: Hurts little more Pain Location: head Pain Descriptors / Indicators: Headache Pain Intervention(s): Monitored during session;Limited activity within patient's tolerance;Repositioned     Hand Dominance (amidexterous)   Extremity/Trunk Assessment Upper Extremity Assessment Upper Extremity Assessment: Generalized weakness   Lower Extremity Assessment Lower Extremity Assessment: Defer to PT evaluation RLE Deficits / Details: Hx of spina bifida; limited knee/ankle AROM, limited hip extension. Limited assessment due to stool and dizziness. LLE Deficits / Details: Hx of spina bifida, limited ankle AROM, no knee flexion AROM, limited hip extension. Limited assessment due to stool and dizziness.   Cervical / Trunk Assessment Cervical / Trunk Assessment: (Hx of spina bifida, sensitive to touch along lower back)   Communication Communication Communication: Expressive difficulties   Cognition Arousal/Alertness: Awake/alert Behavior During Therapy: WFL for tasks assessed/performed Overall Cognitive Status: Within Functional Limits for tasks assessed                                 General Comments: Feel at baseline for cognition. Following cues and answering questions appropiately. Tearful post session.   General Comments  BP dropped from 133/70 to 116/67 once EOB    Exercises     Shoulder Instructions      Home Living Family/patient expects to be discharged to:: Private residence Living Arrangements: Alone Available Help at Discharge: Family;Available PRN/intermittently Type of Home: House Home Access: Stairs to enter [employer] of Steps: 4 Entrance  Stairs-Rails: Right Home Layout: One level     Bathroom Shower/Tub: Chief Strategy Officer: Standard     Home Equipment: Crutches;Wheelchair - manual          Prior Functioning/Environment Level of Independence: Independent with assistive device(s)        Comments: Uses crutches and braces for ambulation (donns on floor). Drives. Does not cook or clean. 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 likes to collect corning ware and men's high end shoes        OT Problem List: Decreased strength;Decreased range of motion;Decreased activity tolerance;Impaired balance (sitting and/or standing);Impaired vision/perception;Decreased coordination;Decreased knowledge of use of DME or AE;Decreased knowledge of precautions;Pain      OT Treatment/Interventions: Self-care/ADL training;Therapeutic exercise;Energy conservation;DME and/or AE instruction;Therapeutic activities;Patient/family education;Balance training;Visual/perceptual remediation/compensation    OT Goals(Current goals can be found in the care plan section) Acute Rehab OT Goals Patient Stated Goal: to get back to walking and caring for self OT Goal Formulation: With patient Time For Goal Achievement: 01/09/19 Potential to Achieve Goals: Good  OT Frequency: Min 3X/week   Barriers to D/C:            Co-evaluation PT/OT/SLP Co-Evaluation/Treatment: Yes Reason for Co-Treatment: For patient/therapist safety;To address functional/ADL transfers;Complexity of the patient's impairments (multi-system involvement)   OT goals addressed during session: ADL's and self-care      AM-PAC OT "6 Clicks" Daily Activity     Outcome Measure Help from another person eating meals?: None Help from another person taking care of personal grooming?: A Little Help from another person toileting, which includes using toliet, bedpan, or urinal?: A Lot Help from another person bathing (including washing, rinsing,  drying)?:  A Lot Help from another person to put on and taking off regular upper body clothing?: A Lot Help from another person to put on and taking off regular lower body clothing?: A Lot 6 Click Score: 15   End of Session Equipment Utilized During Treatment: Other (comment);Gait belt(crutches) Nurse Communication: Mobility status  Activity Tolerance: Patient tolerated treatment well Patient left: in bed;with call bell/phone within reach;with bed alarm set  OT Visit Diagnosis: Unsteadiness on feet (R26.81);Other abnormalities of gait and mobility (R26.89);Muscle weakness (generalized) (M62.81);Pain;Low vision, both eyes (H54.2) Pain - part of body: (Back)                Time: [id number] OT Time Calculation (min): 44 min Charges:  OT General Charges $OT Visit: 1 Visit OT Evaluation $OT Eval Moderate Complexity: 1 Mod  Caden Fatica MSOT, OTR/L Acute Rehab Pager: [phone] Office: [phone]  Theodoro GristCharis M Quiera Diffee 12/26/2018, 5:19 PM

## 2018-12-26 NOTE — Progress Notes (Signed)
Echocardiogram 2D Echocardiogram has been performed.  Carlos Ferguson 12/26/2018, 11:22 AM

## 2018-12-26 NOTE — Progress Notes (Signed)
Belongings at bedside: black glasses, phone charger, cell phone, crutches, pair of black tennis shoes, pair of socks, and leg braces.  Belongings taken home with father: black bag of belongings including clothes and home medications.

## 2018-12-26 NOTE — Progress Notes (Signed)
Patient ID: Carlos Ferguson, male   DOB: [birth year], 53 y.o.   MRN: [id number] Vital signs are stable and patient is alert he is moving his upper extremities well no evidence of a drift he feels a bit more comfortable this morning.  Given his good level of consciousness I doubt that hydrocephalus is likely to become a problem I have advised that he can proceed with therapies and mobilization from a neurosurgical standpoint.  We will continue to follow

## 2018-12-26 NOTE — Progress Notes (Signed)
STROKE TEAM PROGRESS NOTE   INTERVAL HISTORY Patient is sitting up in bed awake alert and interactive.  He has no complaints.  His blood pressure is adequately controlled but he is on Cleviprex drip.  He has erythema as this rash on his posterior medial upper thighs likely fungal.  He also has some thickening of skin with erythema on his shin bilaterally just above the ankle is likely from tight fitting leg braces.  Vitals:   12/26/18 0815 12/26/18 0830 12/26/18 0930 12/26/18 0934  BP: 139/68 135/65 123/69 123/69  Pulse: 91 (!) 105 93 98  Resp: (!) 28 (!) 23 18   Temp:      TempSrc:      SpO2: 100% 100% 100%   Weight:      Height:        CBC:  Recent Labs  Lab 12/25/18 1623  WBC 13.2*  NEUTROABS 11.6*  HGB 15.7  HCT 44.6  MCV 90.8  PLT 248    Basic Metabolic Panel:  Recent Labs  Lab 12/25/18 1623  NA 139  K 3.6  CL 101  CO2 27  GLUCOSE 115*  BUN 9  CREATININE 0.86  CALCIUM 9.5   Lipid Panel: No results found for: CHOL, TRIG, HDL, CHOLHDL, VLDL, LDLCALC HgbA1c: No results found for: HGBA1C Urine Drug Screen: No results found for: LABOPIA, COCAINSCRNUR, LABBENZ, AMPHETMU, THCU, LABBARB  Alcohol Level No results found for: ETH  IMAGING Ct Head Wo Contrast 12/25/2018 1650 1. 3 cm LEFT cerebellar hematoma. 2. Severe hydrocephalus, slightly increased since 2010. Unchanged RIGHT posterior parietal ventriculostomy catheter with tip at the midline.   Ct Angio Head W Or Wo Contrast Ct Angio Neck W Or Wo Contrast 12/25/2018 2052 1. Negative CTA of the head and neck. No aneurysm or other vascular abnormality seen underlying the left cerebellar hematoma. 2. Minor atherosclerotic change for patient age about the aortic arch and left carotid bifurcation. No large vessel occlusion or hemodynamically significant stenosis. 3. Predominant fetal type origin of the PCAs with overall diminutive vertebrobasilar system. 4. Hypoplastic/absent right A1, with the anterior cerebral artery  supplied via the left carotid artery system.   Mr Carlos JeanBrain W Wo Contrast 12/25/2018 2208 MRI brain without and with contrast is consistent with a spontaneous LEFT 2.5 cm cerebellar hemorrhage, without underlying neoplasm or vascular malformation. Given the numerous other chronic microbleeds throughout the brain, the observed hemorrhage likely relates to either amyloid angiopathy or hypertensive cerebrovascular disease. Stigmata of Chiari II malformation, with chronic ventriculomegaly, not significantly worse compared with 2010 cross-sectional imaging. Continued surveillance is warranted.   Dg Skull 1-3 Views 12/25/2018 1730 Right parietal approach ventricular shunt catheter terminates posteriorly in the midline. No unexpected shunt kink or discontinuity in the head or neck.   Dg Chest 1 View 12/25/2018 1735 Right-sided VP shunt catheter courses inferiorly from the right neck into the medial right chest, and is not clearly visualized below the level of the T6 level, concerning for shunt catheter fracture. Please see the separate concurrent abdominal radiograph report for further details. A CT of the chest, abdomen and pelvis could be obtained to confirm this finding, as clinically warranted.   Chest 2 View 12/25/2018 2028 No evidence of acute cardiopulmonary disease.   Dg Abd 1 View 12/25/2018 1739 Fractured shunt catheter fragment coiled in the deep pelvis, which does not communicate with the shunt catheter in the right neck and upper chest. Nonobstructive bowel gas pattern.    PHYSICAL EXAM Middle-aged Caucasian male with abnormal  faces with hypertelorism and anterior posterior elongated scalp with decrease horizontal diameter.  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  He has multiple surgical scars in the leg.  There is erythematous rash over the posterior medial upper thighs which is likely fungal with has central clearing and satellite lesions. Cardiac exam  no murmur or gallop. Lungs clear to auscultation. There is trace pedal edema and some erythema over the fingers.  Both feet have fixed lateral rotation.  Neurological Exam :  Awake alert oriented to time place and person.  Speech is clear without aphasia or dysarthria.  Extraocular movements are full range he does have insistent horizontal lateral gaze evoked nystagmus.  He does have saccadic dysmetria.  There is slight esotropia of the left eye.  Pupils equal reactive.  Fundi not visualized.  Face is symmetric without weakness.  Tongue midline.  Motor system exam no upper extremity drift with symmetric and equal strength in both upper extremities finger-to-nose coordination is slow but accurate in upper extremities.  He has paraparesis with right lower extremity more weak than the left.  He had 2/5 strength in proximal right lower extremity and 1/5 strength distally.  Left lower extremity strength is 2-3/5 proximally and 1/5 distally.  Deep tendon flexes absent in lower extremities and present in upper extremities.  Sensation is intact in upper extremities but diminished in both lower extremities.  Plantars are both not elicitable.  Gait not tested. ASSESSMENT/PLAN Mr. Carlos Ferguson is a 53 y.o. male with history of spina bifida and hydrocephalus, s/p shunt placement many years ago  presenting with 4 day hx N/V.   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 Severe hydrocephalus. R post parietal ventriculostomy w/ tip midline  CTA head & neck no ELVO. Minor atherosclerosis aortic arch and L ICA bifurcation.  MRI  L cerebellar hemorrhage. No underlying abnormality. Chronic microbleeds throughout. 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 Ok to be oob  Disposition:  pending   Hypertension  Home meds:  Atenolol 50 daily  SBP goal < 160      On cleviprex Resumed atenolol. Wean cleviprex as able. Has prn labetolol . Long-term BP goal normotensive  Other Stroke Risk Factors  Hx ETOH use, alcohol level No results found  Obesity, Body mass index is 31.15 kg/m., recommend weight loss, diet and exercise as appropriate   Other Active Problems  Spina bifida w/ hx CSF shunt not currently functional, ventriculomegaly stable. R VP shunt into R chest. Fractured shunt fragment coiled in deep pelvix w/o communication to cath in neck. [facility] - [facility] consulted.  Urinary retention, self caths at home  Seizure d/o on keppra  Hypokalemia on K 20 meq daily  LE w/ symmetric redness and loss of care. Seems to be related to braces  B back of thigh/groin redness w/ central clearing. Looks fungal. Will try antifungal powder. WOC consult placed by Dr. Pearlean Brownie.  Mild leukocytosis 12.3 w/ low grade temperature 9.9. monitor  Hospital day # 1 I have personally obtained history,examined this patient, reviewed notes, independently viewed imaging studies, participated in medical decision making and plan of care.ROS completed by me personally and pertinent positives fully documented  I have made any additions or clarifications directly to the above note.  He presented with several day history of nausea vomiting and CT scan shows a left cerebellar hemorrhage etiology indeterminate but possibly hypertensive.  Continue close neurological monitoring and strict blood pressure control.  Mobilize out of bed.  Therapy consults.  Wean off blood pressure control drip and use PRN medications for oral visits.  Agree patient is unlikely to need acute ventriculostomy or revision of his VP shunt since he is clinically doing so well.  Discussed with patient and care team and answered questions. This patient is critically ill  and at significant risk of neurological worsening, death and care requires constant monitoring of vital signs, hemodynamics,respiratory and cardiac monitoring, extensive review of multiple databases, frequent neurological assessment, discussion with family, other specialists and medical decision making of high complexity.I have made any additions or clarifications directly to the above note.This critical care time does not reflect procedure time, or teaching time or supervisory time of PA/NP/Med Resident etc but could involve care discussion time.  I spent 30 minutes of neurocritical care time  in the care of  this patient.      Antony Contras, MD Medical Director [facility] Pager: [phone] 12/26/2018 1:45 PM   To contact Stroke Continuity provider, please refer to [email or web address] After hours, contact General Neurology

## 2018-12-26 NOTE — Evaluation (Signed)
Physical Therapy Evaluation Patient Details Name: Carlos Ferguson MRN: [id number] DOB: Nov 23, 1965 Today's Date: 12/26/2018   History of Present Illness  Patient is a 53 y/o male who presents with nausea, headache, diarrhea and weakness. 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 PMH includes spina bifida.  Clinical Impression  Patient presents with dizziness, weakness, impaired balance, incoordination, nystagmus and impaired mobility s/p above. Pt with hx of spina bifida but lives alone and independent with ADLs PTA. Pt uses crutches and braces on BLEs for mobility and drives. Pt's dad lives next door. Today, pt incontinent of stool and having diarrhea. Requires Mod A of 2 for bed mobility and able to stand with Mod A of 2 and crutches but limited due to dizziness and stool incontinence. BP dropped from 133/70 to 116/67 once EOB. Noted to have what appeared to be directional changing nystagmus once returned to right side-lying. Increased time and difficulty donning socks/braces sitting EOB. Would perform better in long sitting in bed (pt normally does this on the floor). Education on gaze stabilization to assist with feeling of dizziness. Pt tearful post session. Would benefit from CIR to maximize independence and mobility prior to return home. Will follow acutely.    Follow Up Recommendations CIR    Equipment Recommendations  Other (comment)(defer)    Recommendations for Other Services Rehab consult     Precautions / Restrictions Precautions Precautions: Fall Precaution Comments: nystagmus and dizziness, watch BP Restrictions Weight Bearing Restrictions: No      Mobility  Bed Mobility Overal bed mobility: Needs Assistance Bed Mobility: Rolling;Supine to Sit;Sit to Sidelying Rolling: Mod assist Sidelying to sit: Mod assist;HOB elevated;+2 for safety/equipment     Sit to sidelying: Mod assist General bed mobility comments: Assist to roll to right/left for pericare due to  incontinence. Assist to elevate trunkl to get to EOB, dizziness with sitting upright. Assist to bring LEs into bed.  Transfers Overall transfer level: Needs assistance Equipment used: Crutches Transfers: Sit to/from Stand Sit to Stand: Mod assist;+2 safety/equipment;+2 physical assistance         General transfer comment: Assist of 2 to stand from EOB with seat deflated (pt 4'9), not able to get fully upright. Weakness noted in BLEs.  Ambulation/Gait             General Gait Details: Unable due to weakness, stool.  Stairs            Wheelchair Mobility    Modified Rankin (Stroke Patients Only) Modified Rankin (Stroke Patients Only) Pre-Morbid Rankin Score: Slight disability Modified Rankin: Severe disability     Balance Overall balance assessment: Needs assistance Sitting-balance support: Feet unsupported;Bilateral upper extremity supported Sitting balance-Leahy Scale: Poor Sitting balance - Comments: varying assist needed, Min guard-Mod A for donning socks, braces and shoes sitting EOB. Dizziness with any change in position Postural control: Posterior lean Standing balance support: During functional activity;Bilateral upper extremity supported Standing balance-Leahy Scale: Poor Standing balance comment: Requires assist of 2 for standing.                             Pertinent Vitals/Pain Pain Assessment: Faces Faces Pain Scale: Hurts little more Pain Location: head Pain Descriptors / Indicators: Headache Pain Intervention(s): Repositioned;Monitored during session    Home Living Family/patient expects to be discharged to:: Private residence Living Arrangements: Alone Available Help at Discharge: Family;Available PRN/intermittently Type of Home: House Home Access: Stairs to enter Entrance Stairs-Rails: Right Entrance Stairs-Number of Steps:  4 Home Layout: One level Home Equipment: Crutches;Wheelchair - manual      Prior Function Level of  Independence: Independent with assistive device(s)         Comments: Uses crutches and braces for ambulation (donns on floor). Drives. Does not cook or clean. 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 likes to collect corning ware and men's high end shoes     Hand Dominance   Dominant Hand: (amidexterous)    Extremity/Trunk Assessment   Upper Extremity Assessment Upper Extremity Assessment: Defer to OT evaluation(impaired coordination and difficulty donning braces which pt normally does independently)    Lower Extremity Assessment Lower Extremity Assessment: Generalized weakness;RLE deficits/detail;LLE deficits/detail RLE Deficits / Details: Hx of spina bifida; limited knee/ankle AROM, limited hip extension. Limited assessment due to stool and dizziness. LLE Deficits / Details: Hx of spina bifida, limited ankle AROM, no knee flexion AROM, limited hip extension. Limited assessment due to stool and dizziness.    Cervical / Trunk Assessment Cervical / Trunk Assessment: (Hx of spina bifida, sensitive to touch along lower back)  Communication   Communication: Expressive difficulties  Cognition Arousal/Alertness: Awake/alert Behavior During Therapy: WFL for tasks assessed/performed Overall Cognitive Status: Within Functional Limits for tasks assessed                                 General Comments: Tearful post session.      General Comments General comments (skin integrity, edema, etc.): Noted to have directional changing nystagmus once pt returned to right sidelying rotational and upward beating.    Exercises     Assessment/Plan    PT Assessment Patient needs continued PT services  PT Problem List Decreased strength;Decreased mobility;Pain;Decreased balance;Cardiopulmonary status limiting activity;Decreased coordination       PT Treatment Interventions Therapeutic activities;Gait training;Therapeutic exercise;Patient/family  education;Balance training;Neuromuscular re-education;Functional mobility training    PT Goals (Current goals can be found in the Care Plan section)  Acute Rehab PT Goals Patient Stated Goal: to get back to walking and caring for self PT Goal Formulation: With patient Time For Goal Achievement: 01/03/19 Potential to Achieve Goals: Fair    Frequency Min 4X/week   Barriers to discharge Decreased caregiver support lives alone    Co-evaluation PT/OT/SLP Co-Evaluation/Treatment: Yes Reason for Co-Treatment: For patient/therapist safety;To address functional/ADL transfers           AM-PAC PT "6 Clicks" Mobility  Outcome Measure Help needed turning from your back to your side while in a flat bed without using bedrails?: A Lot Help needed moving from lying on your back to sitting on the side of a flat bed without using bedrails?: A Lot Help needed moving to and from a bed to a chair (including a wheelchair)?: Total Help needed standing up from a chair using your arms (e.g., wheelchair or bedside chair)?: A Lot Help needed to walk in hospital room?: Total Help needed climbing 3-5 steps with a railing? : Total 6 Click Score: 9    End of Session Equipment Utilized During Treatment: Other (comment)(braces BLEs) Activity Tolerance: Treatment limited secondary to medical complications (Comment)(dizziness, stool incontinence) Patient left: in bed;with call bell/phone within reach;with nursing/sitter in room(Rn present in room to change linens) Nurse Communication: Mobility status;Other (comment)(dizziness, stool incontinence) PT Visit Diagnosis: Unsteadiness on feet (R26.81);Muscle weakness (generalized) (M62.81);Dizziness and giddiness (R42);Pain Pain - part of body: (headache)    Time: [id number] PT  Time Calculation (min) (ACUTE ONLY): 48 min   Charges:   PT Evaluation $PT Eval Moderate Complexity: 1 Mod PT Treatments $Therapeutic Activity: 8-22 mins        Wray Kearns,  PT, DPT Acute Rehabilitation Services Pager [phone] Office [phone]      Marguarite Arbour A Sabra Heck 12/26/2018, 4:18 PM

## 2018-12-26 NOTE — Progress Notes (Signed)
Rehab Admissions Coordinator Note:  Patient was screened by Michel Santee for appropriateness for an Inpatient Acute Rehab Consult.  At this time, we are recommending Inpatient Rehab consult.  Please place consult order if pt would like to be considered.   Michel Santee 12/26/2018, 5:08 PM  I can be reached at [phone].

## 2018-12-26 NOTE — Progress Notes (Signed)
PT Cancellation Note  Patient Details Name: Carlos Ferguson MRN: [id number] DOB: 10-03-1965   Cancelled Treatment:    Reason Eval/Treat Not Completed: Active bedrest order. Pt remains on bed rest. Awaiting MD to advance mobility orders as Neurosurgery not planning on taking patient to surgery now. Acute PT to return as able, as appropriate to assess mobility.  Kittie Plater, PT, DPT Acute Rehabilitation Services Pager #: [phone] Office #: [phone]    Berline Lopes 12/26/2018, 8:02 AM

## 2018-12-27 ENCOUNTER — Other Ambulatory Visit: Payer: Self-pay

## 2018-12-27 DIAGNOSIS — G936 Cerebral edema: Secondary | ICD-10-CM | POA: Diagnosis present

## 2018-12-27 LAB — LIPID PANEL
Cholesterol: 100 mg/dL (ref 0–200)
HDL: 38 mg/dL — ABNORMAL LOW (ref >40)
LDL Cholesterol: 42 mg/dL (ref 0–99)
Total CHOL/HDL Ratio: 2.6 RATIO
Triglycerides: 100 mg/dL (ref <150)
VLDL: 20 mg/dL (ref 0–40)

## 2018-12-27 LAB — CBC
HCT: 41.5 % (ref 39.0–52.0)
Hemoglobin: 14.3 g/dL (ref 13.0–17.0)
MCH: 31.2 pg (ref 26.0–34.0)
MCHC: 34.5 g/dL (ref 30.0–36.0)
MCV: 90.4 fL (ref 80.0–100.0)
Platelets: 223 K/uL (ref 150–400)
RBC: 4.59 MIL/uL (ref 4.22–5.81)
RDW: 14.1 % (ref 11.5–15.5)
WBC: 10.6 K/uL — ABNORMAL HIGH (ref 4.0–10.5)
nRBC: 0 % (ref 0.0–0.2)

## 2018-12-27 LAB — BASIC METABOLIC PANEL
Anion gap: 10 (ref 5–15)
BUN: 13 mg/dL (ref 6–20)
CO2: 22 mmol/L (ref 22–32)
Calcium: 8.9 mg/dL (ref 8.9–10.3)
Chloride: 105 mmol/L (ref 98–111)
Creatinine, Ser: 0.72 mg/dL (ref 0.61–1.24)
GFR calc Af Amer: >60 mL/min (ref >60)
GFR calc non Af Amer: >60 mL/min (ref >60)
Glucose, Bld: 109 mg/dL — ABNORMAL HIGH (ref 70–99)
Potassium: 3.3 mmol/L — ABNORMAL LOW (ref 3.5–5.1)
Sodium: 137 mmol/L (ref 135–145)

## 2018-12-27 LAB — HEMOGLOBIN A1C
Hgb A1c MFr Bld: 4.9 % (ref 4.8–5.6)
Mean Plasma Glucose: 93.93 mg/dL

## 2018-12-27 MED ORDER — HEPARIN SODIUM (PORCINE) 5000 UNIT/ML IJ SOLN
5000.0000 [IU] | Freq: Three times a day (TID) | INTRAMUSCULAR | Status: DC
  Administered 2018-12-27 – 2018-12-28 (×4): 5000 [IU] via SUBCUTANEOUS
  Filled 2018-12-27 (×4): qty 1

## 2018-12-27 MED ORDER — POTASSIUM CHLORIDE CRYS ER 20 MEQ PO TBCR
20.0000 meq | EXTENDED_RELEASE_TABLET | Freq: Once | ORAL | Status: AC
  Administered 2018-12-27: 20 meq via ORAL
  Filled 2018-12-27: qty 1

## 2018-12-27 NOTE — Evaluation (Addendum)
Speech Language Pathology Evaluation Patient Details Name: Kedric Bumgarner MRN: [id number] DOB: [birth year] Today's Date: 12/27/2018 Time: 7591-6384 SLP Time Calculation (min) (ACUTE ONLY): 44 min  Problem List:  Patient Active Problem List   Diagnosis Date Noted  . ICH (intracerebral hemorrhage) ([place]) 12/25/2018   Past Medical History:  Past Medical History:  Diagnosis Date  . Self-catheterizes urinary bladder   . Spina bifida [facility])    Past Surgical History:  Past Surgical History:  Procedure Laterality Date  . CSF SHUNT  1969   HPI:  Patient is a 53 y/o male who presents with nausea, headache, diarrhea and weakness. 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 PMH includes spina bifida   Assessment / Plan / Recommendation Clinical Impression  53 yr old with history of spina bifida who lives alone and has been unemployed (for 5 months). He reports having baseline memory deficits and difficulty with number recall. Pt exhibits minimal-mild dysarthria marked by intermittent distortions in conversation although speech is intelligible in a quiet environment. On the Cerebellar Cognitive Affective/Schmahmann Syndrome Scale pt scored a 5/10 for moderte impairment range. Evidence of reduced attention and need for repetition present during assessment. Pt also demonstrated difficulty in alternating attention/category switching and divergent naming given specific parameters. Although he received a passing score on the verbal recall subtest, he would benefit from intervention and strategies for organization, executive functions, dysarthria and problem solving while on acute and recommended follow up on CIR.         SLP Assessment  SLP Recommendation/Assessment: Patient needs continued Speech Lanaguage Pathology Services SLP Visit Diagnosis: Cognitive communication deficit (R41.841)    Follow Up Recommendations  Inpatient Rehab    Frequency and Duration min 2x/week  2 weeks      SLP  Evaluation Cognition  Overall Cognitive Status: Impaired/Different from baseline Arousal/Alertness: Awake/alert Orientation Level: Oriented X4 Attention: Sustained Sustained Attention: Impaired Sustained Attention Impairment: Verbal basic Memory: (scored intact on subtest) Awareness: Appears intact Problem Solving: (suspect impaired to higher level) Executive Function: Sequencing;Organizing Safety/Judgment: Appears intact       Comprehension  Auditory Comprehension Overall Auditory Comprehension: Appears within functional limits for tasks assessed Yes/No Questions: Not tested Commands: (not formally assessed) Interfering Components: Attention Visual Recognition/Discrimination Discrimination: Not tested Reading Comprehension Reading Status: (TBA)    Expression Expression Primary Mode of Expression: Verbal Verbal Expression Overall Verbal Expression: Appears within functional limits for tasks assessed Initiation: No impairment Level of Generative/Spontaneous Verbalization: Conversation Repetition: (NT) Naming: Not tested(mild hesitations, self corrected) Pragmatics: No impairment Written Expression Dominant Hand: Right(pt stated he was right handed) Written Expression: Not tested   Oral / Motor  Oral Motor/Sensory Function Overall Oral Motor/Sensory Function: (no focal weakness) Motor Speech Overall Motor Speech: Impaired Respiration: Within functional limits Phonation: Normal Resonance: (?) Articulation: Impaired Level of Impairment: Conversation Intelligibility: Intelligible(min-mild distortions) Motor Planning: Witnin functional limits   GO                    Houston Siren 12/27/2018, 1:28 PM   Orbie Pyo Humbert Morozov M.Ed Risk analyst [phone] Office [phone]

## 2018-12-27 NOTE — Progress Notes (Signed)
Inpatient Rehabilitation Admissions Coordinator  Inpatient rehab consult received. I met with inpatient at bedside for rehab assessment. We discussed goals an expectations of an inpt rehab admit. He prefers inpt rehab and is an excellent candidate. I will follow up tomorrow to assist with planning timing of CIR admit.  Danne Baxter, RN, MSN Rehab Admissions Coordinator [phone] 12/27/2018 1:08 PM

## 2018-12-27 NOTE — Progress Notes (Signed)
PT Cancellation Note  Patient Details Name: Carlos Ferguson MRN: [id number] DOB: February 08, 1966   Cancelled Treatment:    Reason Eval/Treat Not Completed: Patient at procedure or test/unavailable  With Speech Therapy (just initiated session)   Rexanne Mano, PT 12/27/2018, 11:24 AM

## 2018-12-27 NOTE — Progress Notes (Signed)
Physical Therapy Treatment Patient Details Name: Carlos Ferguson MRN: [id number] DOB: December 11, 1965 Today's Date: 12/27/2018    History of Present Illness Patient is a 53 y/o male who presents with nausea, headache, diarrhea and weakness. 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 PMH includes spina bifida.    PT Comments    Pt showed signs consistent with central vertigo (direction changing, upbeating, long lasting nystagmus, motion sensitivity).  Compensatory training (targeting) and x1 gaze stability activities initiated.  Pt tolerated EOB better today despite reporting more intense HA.  More vision testing needed as he had difficulty reading and difficulty doing x1 exercises as he kept losing the "A".  Pt eager to progress to standing and OOB as his HA pain improves. PT will continue to follow acutely for safe mobility progression  Follow Up Recommendations  CIR     Equipment Recommendations  None recommended by PT    Recommendations for Other Services   NA     Precautions / Restrictions Precautions Precautions: Fall Precaution Comments: nystagmus and dizziness, watch BP    Mobility  Bed Mobility Overal bed mobility: Needs Assistance Bed Mobility: Supine to Sit;Sit to Supine     Supine to sit: Mod assist;+2 for physical assistance Sit to supine: Mod assist;+2 for physical assistance   General bed mobility comments: Pt needed mod assist at trunk and legs to help progress to EOB and scoot to edge (feet did not touch due to short stature and high ICU bed).      Modified Rankin (Stroke Patients Only) Modified Rankin (Stroke Patients Only) Pre-Morbid Rankin Score: Slight disability Modified Rankin: Severe disability     Balance Overall balance assessment: Needs assistance Sitting-balance support: Feet supported;Bilateral upper extremity supported;Single extremity supported Sitting balance-Leahy Scale: Fair Sitting balance - Comments: pt had periods of close supervision,  when scooting required min assist for balance as he had increased sway when moving his body or his head.              12/27/18 1731  Symptom Behavior  Subjective history of current problem dizziness with movement of head or eyes  Type of Dizziness  Spinning  Frequency of Dizziness intermittent  Duration of Dizziness long  Symptom Nature Motion provoked;Positional;Variable  Aggravating Factors Activity in general;Looking up to the ceiling;Moving eyes;Supine to sit  Relieving Factors Lying supine;Closing eyes;Rest;Slow movements;Head stationary  Progression of Symptoms Better  History of similar episodes no, but does report that he had issues as a youth looking up made him dizzy  Oculomotor Exam  Spontaneous Direction changing nystagmus  Gaze-induced  Right beating nystagmus with R gaze;Left beating nystagmus with R gaze;Direction changing nystagmus (upbeating in central gaze and R up/rotational with sit->sup)  Smooth Pursuits Saccades  Vestibulo-Ocular Reflex  VOR 1 Head Only (x 1 viewing) very difficult vertical worse than horizontal   12/27/2018 started education re: targeting and x1 gaze stability activities for habituation. I want pt to try to keep his nausea and dizziness symptoms under a 5/10.                           Cognition Arousal/Alertness: Awake/alert Behavior During Therapy: WFL for tasks assessed/performed                                   General Comments: Generally oriented, not specifically tested.  Difficulty with complex vestibular exercises, but not sure if that  is cognition or visual      Exercises Other Exercises Other Exercises: x1 seated gaze stability exercises horizontal and vertical, pt having difficuty when looking R keeping his eyes fixed on target A.  Difficulty reading (confounded by poor reading glasses), no reports of double vision.  Expected direction changing nystagmus (left, right, upbeating).      General Comments  General comments (skin integrity, edema, etc.): Sat EOB for >20 mins working on compensatory targeting, attempting gaze stability exercises and general gaze exercises using target "A"s hanging around the room.       Pertinent Vitals/Pain Pain Assessment: Faces Faces Pain Scale: Hurts whole lot Pain Location: head Pain Descriptors / Indicators: Aching Pain Intervention(s): Limited activity within patient's tolerance;Monitored during session;Repositioned           PT Goals (current goals can now be found in the care plan section) Acute Rehab PT Goals Patient Stated Goal: to get back to walking and caring for self Progress towards PT goals: Progressing toward goals    Frequency    Min 4X/week      PT Plan Current plan remains appropriate       AM-PAC PT "6 Clicks" Mobility   Outcome Measure  Help needed turning from your back to your side while in a flat bed without using bedrails?: A Lot Help needed moving from lying on your back to sitting on the side of a flat bed without using bedrails?: A Lot Help needed moving to and from a bed to a chair (including a wheelchair)?: Total Help needed standing up from a chair using your arms (e.g., wheelchair or bedside chair)?: Total Help needed to walk in hospital room?: Total Help needed climbing 3-5 steps with a railing? : Total 6 Click Score: 8    End of Session   Activity Tolerance: Patient limited by pain(limited by HA) Patient left: in bed;with bed alarm set;with call bell/phone within reach;with family/visitor present Nurse Communication: Mobility status PT Visit Diagnosis: Unsteadiness on feet (R26.81);Muscle weakness (generalized) (M62.81);Dizziness and giddiness (R42);Pain Pain - Right/Left: (frontal) Pain - part of body: (head)     Time: 1515-1600 PT Time Calculation (min) (ACUTE ONLY): 45 min  Charges:  $Therapeutic Activity: 38-52 mins                    Sherryann Frese B. Estephan Gallardo, PT, DPT  Acute  Rehabilitation [phone] pager [phone] office  @ [facility] [place] [place]: [phone]   12/27/2018, 5:39 PM

## 2018-12-27 NOTE — Progress Notes (Signed)
Patient ID: Carlos Ferguson, male   DOB: 01/02/1966, 53 y.o.   MRN: [id number] Vital signs are stable No new complaints of headache Clinically seems to be improving I will sign off from neurosurgery at this time

## 2018-12-27 NOTE — Progress Notes (Signed)
STROKE TEAM PROGRESS NOTE   INTERVAL HISTORY Patient is lying in bed. State headache is improved.slurred speech is also better. BP well controlled.No neuro changes.  Vitals:   12/27/18 0500 12/27/18 0600 12/27/18 0700 12/27/18 0800  BP: 136/72 (!) 152/71 (!) 142/74 (!) 159/84  Pulse: (!) 49  (!) 57 (!) 57  Resp: 12 15 16 19   Temp:    97.9 F (36.6 C)  TempSrc:    Oral  SpO2: 100% 100% 100% 100%  Weight:      Height:        CBC:  Recent Labs  Lab 12/25/18 1623 12/27/18 0345  WBC 13.2* 10.6*  NEUTROABS 11.6*  --   HGB 15.7 14.3  HCT 44.6 41.5  MCV 90.8 90.4  PLT 248 223    Basic Metabolic Panel:  Recent Labs  Lab 12/25/18 1623 12/27/18 0345  NA 139 137  K 3.6 3.3*  CL 101 105  CO2 27 22  GLUCOSE 115* 109*  BUN 9 13  CREATININE 0.86 0.72  CALCIUM 9.5 8.9   Lipid Panel:     Component Value Date/Time   CHOL 100 12/27/2018 0345   TRIG 100 12/27/2018 0345   HDL 38 (L) 12/27/2018 0345   CHOLHDL 2.6 12/27/2018 0345   VLDL 20 12/27/2018 0345   LDLCALC 42 12/27/2018 0345   HgbA1c:  Lab Results  Component Value Date   HGBA1C 4.9 12/27/2018   IMAGING Ct Head Wo Contrast 12/25/2018 1650 1. 3 cm LEFT cerebellar hematoma. 2. Severe hydrocephalus, slightly increased since 2010. Unchanged RIGHT posterior parietal ventriculostomy catheter with tip at the midline.   Ct Angio Head W Or Wo Contrast Ct Angio Neck W Or Wo Contrast 12/25/2018 2052 1. Negative CTA of the head and neck. No aneurysm or other vascular abnormality seen underlying the left cerebellar hematoma. 2. Minor atherosclerotic change for patient age about the aortic arch and left carotid bifurcation. No large vessel occlusion or hemodynamically significant stenosis. 3. Predominant fetal type origin of the PCAs with overall diminutive vertebrobasilar system. 4. Hypoplastic/absent right A1, with the anterior cerebral artery supplied via the left carotid artery system.   Mr Carlos JeanBrain W Wo Contrast 12/25/2018  2208 MRI brain without and with contrast is consistent with a spontaneous LEFT 2.5 cm cerebellar hemorrhage, without underlying neoplasm or vascular malformation. Given the numerous other chronic microbleeds throughout the brain, the observed hemorrhage likely relates to either amyloid angiopathy or hypertensive cerebrovascular disease. Stigmata of Chiari II malformation, with chronic ventriculomegaly, not significantly worse compared with 2010 cross-sectional imaging. Continued surveillance is warranted.   Dg Skull 1-3 Views 12/25/2018 1730 Right parietal approach ventricular shunt catheter terminates posteriorly in the midline. No unexpected shunt kink or discontinuity in the head or neck.   Dg Chest 1 View 12/25/2018 1735 Right-sided VP shunt catheter courses inferiorly from the right neck into the medial right chest, and is not clearly visualized below the level of the T6 level, concerning for shunt catheter fracture. Please see the separate concurrent abdominal radiograph report for further details. A CT of the chest, abdomen and pelvis could be obtained to confirm this finding, as clinically warranted.   Chest 2 View 12/25/2018 2028 No evidence of acute cardiopulmonary disease.   Dg Abd 1 View 12/25/2018 1739 Fractured shunt catheter fragment coiled in the deep pelvis, which does not communicate with the shunt catheter in the right neck and upper chest. Nonobstructive bowel gas pattern.   2D Echocardiogram  1. There is dilatation of the  aortic root.  2. The left ventricle has normal systolic function with an ejection fraction of 60-65%. The cavity size was normal. Left ventricular diastolic Doppler parameters are consistent with impaired relaxation. No evidence of left ventricular regional wall  motion abnormalities.  3. The aortic valve is tricuspid. Aortic valve regurgitation is mild by color flow Doppler. No stenosis of the aortic valve.  4. The right ventricle has normal systolic  function. The cavity was normal. There is no increase in right ventricular wall thickness.  5. No evidence of mitral valve stenosis. No significant mitral regurgitation.  6. The IVC was not visualized. No complete TR doppler jet so unable to estimate PA systolic pressure.   PHYSICAL EXAM   Middle-aged Caucasian male with abnormal faces with hypertelorism and anterior posterior elongated scalp with decrease horizontal diameter.  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  He has multiple surgical scars in the leg.  There is erythematous rash over the posterior medial upper thighs which is likely fungal with has central clearing and satellite lesions. Cardiac exam no murmur or gallop. Lungs clear to auscultation. There is trace pedal edema and some erythema over the fingers.  Both feet have fixed lateral rotation.  Neurological Exam :  Awake alert oriented to time place and person.  Speech is clear without aphasia or dysarthria.  Extraocular movements are full range he does have pendular nystagmus in all directions.  He does have saccadic dysmetria.  There is slight esotropia of the left eye.  Pupils equal reactive.  Fundi not visualized.  Face is symmetric without weakness.  Tongue midline.  Motor system exam no upper extremity drift with symmetric and equal strength in both upper extremities finger-to-nose coordination is slow but accurate in upper extremities.  He has paraparesis with right lower extremity more weak than the left.  He had 2/5 strength in proximal right lower extremity and 1/5 strength distally.  Left lower extremity strength is 2-3/5 proximally and 1/5 distally.  Deep tendon flexes absent in lower extremities and present in upper extremities.  Sensation is intact in upper extremities but diminished in both lower extremities.  Plantars are both not elicitable.  Gait not tested.   ASSESSMENT/PLAN Mr. Carlos Ferguson is a 53 y.o. male with history of  spina bifida and hydrocephalus, s/p shunt placement many years ago  presenting with 4 day hx N/V.   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 Severe hydrocephalus. R post parietal ventriculostomy w/ tip midline  CTA head & neck no ELVO. Minor atherosclerosis aortic arch and L ICA bifurcation.  MRI  L cerebellar hemorrhage. No underlying abnormality. Chronic microbleeds throughout. 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 No source of embolus    LDL 42  HgbA1c 4.9  SCDs for VTE prophylaxis. D/c SCDs given red legs. Add Heparin 5000 units sq tid as stable post hemorrhage.    No antithrombotic prior to admission, now on No antithrombotic given hmg  Therapy recommendations:  CIR. Consult placed  Disposition:  pending   Transfer to the floor  Headache secondary to stroke  Tylenol for pain   Hypertension  Home meds:  Atenolol 50 daily  Treated with cleviprex, off yesterday  Resumed atenolol  prn labetolol  SBP goal < 160     . Long-term BP goal normotensive  Other Stroke Risk Factors  Hx ETOH use, alcohol level No results found  Obesity, Body mass index is 31.15 kg/m., recommend weight loss, diet and exercise as appropriate   Other Active Problems  Spina bifida w/ hx CSF shunt not currently functional, ventriculomegaly stable. R VP shunt into R chest. Fractured shunt fragment coiled in deep pelvix w/o communication to cath in neck. [facility] - [facility] consulted.  Urinary retention, self caths at home  Seizure d/o on keppra  Hypokalemia on K 20 meq daily -> 3.3. give extra dose K today  LE w/ symmetric redness and loss of care. Seems to be related to braces  B back of thigh/groin redness w/ central clearing. Looks fungal. Started on antifungal powder. WOC consult placed by Dr. Pearlean Brownie.  Mild leukocytosis  12.3 w/ low grade temperature 9.9. monitor ->10.6, afebrile  Hospital day # 2  Continue strict BP control with SBP goal below 160.Continue tylenol for headache.Mobilize out of bed and ongoing therapy and rehab consults. Transfer to neuro floor bed.Likely transfer to rehab in next few days.I spoke to his dad over phone and answered questions.  This patient is critically ill and at significant risk of neurological worsening, death and care requires constant monitoring of vital signs, hemodynamics,respiratory and cardiac monitoring, extensive review of multiple databases, frequent neurological assessment, discussion with family, other specialists and medical decision making of high complexity.I have made any additions or clarifications directly to the above note.This critical care time does not reflect procedure time, or teaching time or supervisory time of PA/NP/Med Resident etc but could involve care discussion time.  I spent 30 minutes of neurocritical care time  in the care of  this patient.      Delia Heady, MD Medical Director [facility] Pager: [phone] 12/27/2018 9:54 AM   To contact Stroke Continuity provider, please refer to [email or web address]. After hours, contact General Neurology

## 2018-12-28 ENCOUNTER — Inpatient Hospital Stay (HOSPITAL_COMMUNITY): Payer: Medicare Other

## 2018-12-28 ENCOUNTER — Encounter (HOSPITAL_COMMUNITY): Payer: Self-pay | Admitting: *Deleted

## 2018-12-28 ENCOUNTER — Encounter (HOSPITAL_COMMUNITY): Payer: Self-pay | Admitting: Physical Medicine and Rehabilitation

## 2018-12-28 ENCOUNTER — Inpatient Hospital Stay (HOSPITAL_COMMUNITY)
Admission: AD | Admit: 2018-12-28 | Discharge: 2019-02-02 | DRG: 057 | Disposition: A | Discharge status: Skilled Nursing Facility | Payer: Medicare Other | Source: Intra-hospital | Attending: Physical Medicine and Rehabilitation | Admitting: Physical Medicine and Rehabilitation

## 2018-12-28 DIAGNOSIS — K592 Neurogenic bowel, not elsewhere classified: Secondary | ICD-10-CM | POA: Diagnosis present

## 2018-12-28 DIAGNOSIS — I614 Nontraumatic intracerebral hemorrhage in cerebellum: Secondary | ICD-10-CM

## 2018-12-28 DIAGNOSIS — M21371 Foot drop, right foot: Secondary | ICD-10-CM | POA: Diagnosis present

## 2018-12-28 DIAGNOSIS — Z87440 Personal history of urinary (tract) infections: Secondary | ICD-10-CM

## 2018-12-28 DIAGNOSIS — R0981 Nasal congestion: Secondary | ICD-10-CM | POA: Diagnosis not present

## 2018-12-28 DIAGNOSIS — Z982 Presence of cerebrospinal fluid drainage device: Secondary | ICD-10-CM

## 2018-12-28 DIAGNOSIS — Q059 Spina bifida, unspecified: Secondary | ICD-10-CM

## 2018-12-28 DIAGNOSIS — D72829 Elevated white blood cell count, unspecified: Secondary | ICD-10-CM

## 2018-12-28 DIAGNOSIS — R519 Headache, unspecified: Secondary | ICD-10-CM | POA: Diagnosis present

## 2018-12-28 DIAGNOSIS — Z823 Family history of stroke: Secondary | ICD-10-CM

## 2018-12-28 DIAGNOSIS — E876 Hypokalemia: Secondary | ICD-10-CM | POA: Diagnosis not present

## 2018-12-28 DIAGNOSIS — I69198 Other sequelae of nontraumatic intracerebral hemorrhage: Principal | ICD-10-CM

## 2018-12-28 DIAGNOSIS — N390 Urinary tract infection, site not specified: Secondary | ICD-10-CM | POA: Diagnosis present

## 2018-12-28 DIAGNOSIS — H5500 Unspecified nystagmus: Secondary | ICD-10-CM | POA: Diagnosis present

## 2018-12-28 DIAGNOSIS — G40909 Epilepsy, unspecified, not intractable, without status epilepticus: Secondary | ICD-10-CM

## 2018-12-28 DIAGNOSIS — I69391 Dysphagia following cerebral infarction: Secondary | ICD-10-CM

## 2018-12-28 DIAGNOSIS — R42 Dizziness and giddiness: Secondary | ICD-10-CM

## 2018-12-28 DIAGNOSIS — G936 Cerebral edema: Secondary | ICD-10-CM

## 2018-12-28 DIAGNOSIS — R0989 Other specified symptoms and signs involving the circulatory and respiratory systems: Secondary | ICD-10-CM | POA: Diagnosis not present

## 2018-12-28 DIAGNOSIS — Z880 Allergy status to penicillin: Secondary | ICD-10-CM | POA: Diagnosis not present

## 2018-12-28 DIAGNOSIS — I69191 Dysphagia following nontraumatic intracerebral hemorrhage: Secondary | ICD-10-CM | POA: Diagnosis not present

## 2018-12-28 DIAGNOSIS — I10 Essential (primary) hypertension: Secondary | ICD-10-CM

## 2018-12-28 DIAGNOSIS — N312 Flaccid neuropathic bladder, not elsewhere classified: Secondary | ICD-10-CM | POA: Diagnosis present

## 2018-12-28 DIAGNOSIS — Z20828 Contact with and (suspected) exposure to other viral communicable diseases: Secondary | ICD-10-CM | POA: Diagnosis present

## 2018-12-28 DIAGNOSIS — M21372 Foot drop, left foot: Secondary | ICD-10-CM | POA: Diagnosis present

## 2018-12-28 DIAGNOSIS — E669 Obesity, unspecified: Secondary | ICD-10-CM | POA: Diagnosis present

## 2018-12-28 DIAGNOSIS — H5711 Ocular pain, right eye: Secondary | ICD-10-CM | POA: Diagnosis present

## 2018-12-28 DIAGNOSIS — R1310 Dysphagia, unspecified: Secondary | ICD-10-CM | POA: Diagnosis present

## 2018-12-28 DIAGNOSIS — Z91040 Latex allergy status: Secondary | ICD-10-CM | POA: Diagnosis not present

## 2018-12-28 DIAGNOSIS — G8220 Paraplegia, unspecified: Secondary | ICD-10-CM | POA: Diagnosis present

## 2018-12-28 DIAGNOSIS — Q052 Lumbar spina bifida with hydrocephalus: Secondary | ICD-10-CM | POA: Diagnosis not present

## 2018-12-28 DIAGNOSIS — R339 Retention of urine, unspecified: Secondary | ICD-10-CM

## 2018-12-28 DIAGNOSIS — Q054 Unspecified spina bifida with hydrocephalus: Secondary | ICD-10-CM | POA: Diagnosis not present

## 2018-12-28 DIAGNOSIS — R197 Diarrhea, unspecified: Secondary | ICD-10-CM | POA: Diagnosis present

## 2018-12-28 DIAGNOSIS — B952 Enterococcus as the cause of diseases classified elsewhere: Secondary | ICD-10-CM | POA: Diagnosis present

## 2018-12-28 DIAGNOSIS — Z8249 Family history of ischemic heart disease and other diseases of the circulatory system: Secondary | ICD-10-CM

## 2018-12-28 DIAGNOSIS — I69122 Dysarthria following nontraumatic intracerebral hemorrhage: Secondary | ICD-10-CM | POA: Diagnosis not present

## 2018-12-28 DIAGNOSIS — R569 Unspecified convulsions: Secondary | ICD-10-CM

## 2018-12-28 DIAGNOSIS — Q6589 Other specified congenital deformities of hip: Secondary | ICD-10-CM

## 2018-12-28 DIAGNOSIS — R51 Headache: Secondary | ICD-10-CM | POA: Diagnosis not present

## 2018-12-28 MED ORDER — TRAMADOL HCL 50 MG PO TABS
50.0000 mg | ORAL_TABLET | Freq: Once | ORAL | Status: AC
  Administered 2018-12-28: 03:00:00 50 mg via ORAL
  Filled 2018-12-28: qty 1

## 2018-12-28 MED ORDER — PROCHLORPERAZINE MALEATE 5 MG PO TABS
5.0000 mg | ORAL_TABLET | Freq: Four times a day (QID) | ORAL | Status: DC | PRN
  Administered 2018-12-29 – 2019-01-23 (×3): 10 mg via ORAL
  Filled 2018-12-28: qty 1
  Filled 2018-12-28 (×3): qty 2

## 2018-12-28 MED ORDER — NYSTATIN 100000 UNIT/GM EX POWD
Freq: Two times a day (BID) | CUTANEOUS | Status: DC
  Administered 2018-12-28 – 2019-01-13 (×32): via TOPICAL
  Administered 2019-01-13: 1 via TOPICAL
  Administered 2019-01-14 – 2019-02-02 (×38): via TOPICAL
  Filled 2018-12-28: qty 15

## 2018-12-28 MED ORDER — PROCHLORPERAZINE EDISYLATE 10 MG/2ML IJ SOLN
5.0000 mg | Freq: Four times a day (QID) | INTRAMUSCULAR | Status: DC | PRN
  Administered 2018-12-29: 10 mg via INTRAMUSCULAR
  Filled 2018-12-28: qty 2

## 2018-12-28 MED ORDER — ONDANSETRON HCL 4 MG/2ML IJ SOLN
INTRAMUSCULAR | Status: AC
  Filled 2018-12-28: qty 2

## 2018-12-28 MED ORDER — BISACODYL 10 MG RE SUPP
10.0000 mg | Freq: Every day | RECTAL | Status: DC | PRN
  Administered 2018-12-28: 10 mg via RECTAL
  Filled 2018-12-28 (×2): qty 1

## 2018-12-28 MED ORDER — DIVALPROEX SODIUM ER 500 MG PO TB24
500.0000 mg | ORAL_TABLET | Freq: Every day | ORAL | Status: DC
  Administered 2018-12-28: 500 mg via ORAL
  Filled 2018-12-28: qty 1

## 2018-12-28 MED ORDER — FLEET ENEMA 7-19 GM/118ML RE ENEM: 1.0000 | ENEMA | Freq: Once | RECTAL | Status: DC | PRN

## 2018-12-28 MED ORDER — ATENOLOL 25 MG PO TABS: 25.0000 mg | ORAL_TABLET | Freq: Once | ORAL | 0 refills | Status: DC

## 2018-12-28 MED ORDER — ATENOLOL 25 MG PO TABS: 75.0000 mg | ORAL_TABLET | Freq: Every day | ORAL | Status: DC

## 2018-12-28 MED ORDER — ENOXAPARIN SODIUM 40 MG/0.4ML ~~LOC~~ SOLN
40.0000 mg | SUBCUTANEOUS | Status: DC
  Administered 2018-12-28 – 2019-02-01 (×36): 40 mg via SUBCUTANEOUS
  Filled 2018-12-28 (×36): qty 0.4

## 2018-12-28 MED ORDER — ONDANSETRON HCL 4 MG/2ML IJ SOLN
4.0000 mg | Freq: Once | INTRAMUSCULAR | Status: AC
  Administered 2018-12-28: 04:00:00 4 mg via INTRAVENOUS

## 2018-12-28 MED ORDER — SENNOSIDES-DOCUSATE SODIUM 8.6-50 MG PO TABS
1.0000 | ORAL_TABLET | Freq: Two times a day (BID) | ORAL | Status: DC
  Administered 2018-12-28 – 2018-12-31 (×6): 1 via ORAL
  Filled 2018-12-28 (×6): qty 1

## 2018-12-28 MED ORDER — POLYETHYLENE GLYCOL 3350 17 G PO PACK
17.0000 g | PACK | Freq: Every day | ORAL | Status: DC | PRN
  Administered 2019-01-20: 17 g via ORAL
  Filled 2018-12-28: qty 1

## 2018-12-28 MED ORDER — CHLORHEXIDINE GLUCONATE CLOTH 2 % EX PADS: 6.0000 | MEDICATED_PAD | Freq: Every day | CUTANEOUS | Status: DC

## 2018-12-28 MED ORDER — DIPHENHYDRAMINE HCL 12.5 MG/5ML PO ELIX: 12.5000 mg | ORAL_SOLUTION | Freq: Four times a day (QID) | ORAL | Status: DC | PRN

## 2018-12-28 MED ORDER — GUAIFENESIN-DM 100-10 MG/5ML PO SYRP: 5.0000 mL | ORAL_SOLUTION | Freq: Four times a day (QID) | ORAL | Status: DC | PRN

## 2018-12-28 MED ORDER — TRAMADOL HCL 50 MG PO TABS
50.0000 mg | ORAL_TABLET | Freq: Four times a day (QID) | ORAL | Status: DC | PRN
  Administered 2018-12-28 – 2019-01-05 (×3): 50 mg via ORAL
  Filled 2018-12-28 (×4): qty 1

## 2018-12-28 MED ORDER — ATENOLOL 50 MG PO TABS
75.0000 mg | ORAL_TABLET | Freq: Every day | ORAL | Status: DC
  Administered 2018-12-29 – 2019-02-02 (×36): 75 mg via ORAL
  Filled 2018-12-28 (×36): qty 1

## 2018-12-28 MED ORDER — NYSTATIN 100000 UNIT/GM EX POWD: Freq: Two times a day (BID) | CUTANEOUS | 0 refills | Status: DC

## 2018-12-28 MED ORDER — TRAZODONE HCL 50 MG PO TABS
25.0000 mg | ORAL_TABLET | Freq: Every evening | ORAL | Status: DC | PRN
  Administered 2019-01-09 – 2019-01-26 (×3): 50 mg via ORAL
  Filled 2018-12-28 (×8): qty 1

## 2018-12-28 MED ORDER — PROCHLORPERAZINE 25 MG RE SUPP: 12.5000 mg | Freq: Four times a day (QID) | RECTAL | Status: DC | PRN

## 2018-12-28 MED ORDER — DIVALPROEX SODIUM ER 500 MG PO TB24
500.0000 mg | ORAL_TABLET | Freq: Every day | ORAL | Status: DC
  Administered 2018-12-29 – 2019-01-05 (×8): 500 mg via ORAL
  Filled 2018-12-28 (×9): qty 1

## 2018-12-28 MED ORDER — ATENOLOL 25 MG PO TABS
25.0000 mg | ORAL_TABLET | Freq: Once | ORAL | Status: AC
  Administered 2018-12-28: 25 mg via ORAL
  Filled 2018-12-28: qty 1

## 2018-12-28 MED ORDER — HEPARIN SODIUM (PORCINE) 5000 UNIT/ML IJ SOLN: 5000.0000 [IU] | Freq: Three times a day (TID) | INTRAMUSCULAR | Status: DC

## 2018-12-28 MED ORDER — POTASSIUM CHLORIDE CRYS ER 20 MEQ PO TBCR
20.0000 meq | EXTENDED_RELEASE_TABLET | Freq: Every day | ORAL | Status: DC
  Administered 2018-12-29 – 2019-01-11 (×14): 20 meq via ORAL
  Filled 2018-12-28 (×14): qty 1

## 2018-12-28 MED ORDER — LEVETIRACETAM 500 MG PO TABS
1500.0000 mg | ORAL_TABLET | Freq: Two times a day (BID) | ORAL | Status: DC
  Administered 2018-12-28 – 2019-02-02 (×72): 1500 mg via ORAL
  Filled 2018-12-28 (×73): qty 3

## 2018-12-28 MED ORDER — ACETAMINOPHEN 325 MG PO TABS
325.0000 mg | ORAL_TABLET | ORAL | Status: DC | PRN
  Administered 2018-12-28 – 2019-01-01 (×5): 650 mg via ORAL
  Administered 2019-01-02: 500 mg via ORAL
  Administered 2019-01-03 – 2019-01-09 (×8): 650 mg via ORAL
  Administered 2019-01-11 – 2019-01-12 (×2): 325 mg via ORAL
  Administered 2019-01-13: 650 mg via ORAL
  Administered 2019-01-14: 325 mg via ORAL
  Administered 2019-01-22 – 2019-01-31 (×2): 650 mg via ORAL
  Administered 2019-02-01: 325 mg via ORAL
  Filled 2018-12-28: qty 1
  Filled 2018-12-28 (×4): qty 2
  Filled 2018-12-28: qty 1
  Filled 2018-12-28 (×14): qty 2

## 2018-12-28 MED ORDER — ALUM & MAG HYDROXIDE-SIMETH 200-200-20 MG/5ML PO SUSP
30.0000 mL | ORAL | Status: DC | PRN
  Administered 2019-01-01 – 2019-01-09 (×2): 30 mL via ORAL
  Filled 2018-12-28 (×3): qty 30

## 2018-12-28 MED ORDER — LABETALOL HCL 5 MG/ML IV SOLN: 20.0000 mg | INTRAVENOUS | Status: DC | PRN

## 2018-12-28 MED ORDER — SENNOSIDES-DOCUSATE SODIUM 8.6-50 MG PO TABS: 1.0000 | ORAL_TABLET | Freq: Two times a day (BID) | ORAL | Status: DC

## 2018-12-28 NOTE — Evaluation (Signed)
Clinical/Bedside Swallow Evaluation Patient Details  Name: Carlos Ferguson MRN: [id number] Date of Birth: Jul 25, 1965  Today's Date: 12/28/2018 Time: SLP Start Time (ACUTE ONLY): 1205 SLP Stop Time (ACUTE ONLY): 1235 SLP Time Calculation (min) (ACUTE ONLY): 30 min  Past Medical History:  Past Medical History:  Diagnosis Date  . Cellulitis of left knee 2014  . Flaccid neurogenic bladder    self caths every 4 hours  . Frequent UTI   . Hydrocephalus ([place]) 2014  . ICH (intracerebral hemorrhage) ([place]) 2014   left frontal ICH  . Seizures ([place])   . Self-catheterizes urinary bladder   . Sepsis ([place]) 2014   due to knee infection   . Spina bifida ([place])   . Ventricular shunt in place    s/Carlos multiple procedures.    Past Surgical History:  Past Surgical History:  Procedure Laterality Date  . CSF SHUNT  1969  . LLE surgery     Multiple surgeries for correction.   Marland Kitchen RLE surgery     surgery for correction    HPI:  Carlos Ferguson is an 53 y.o. male with a history of spina bifida and hydrocephalus, s/Carlos shunt placement many years ago.  Pt presented with a 4 day history of nausea/vomiting/diarrhea and headache.  CT revealed a left cerebellar hemorrhage.     Assessment / Plan / Recommendation Clinical Impression  Pt is a 53 y.o. male with hx of spina bifida.  Pt presents with oral dysphagia and suspected pharyngeal dysphagia.  Pt exhibited prolonged mastication with small bites of regular solids requiring a liquid wash.  Recommended a Dyspahgia 3 (mech soft) diet to pt and family; however they declined and stated that prolonged mastication is baseline.  Pt with intermittent clinical s/sx of aspiration with thin liquid and nectar-thick liquid c/b delayed throat clear and delayed cough with occassional wet vocal quality and belching.  Suspect pharyngeal impairment vs esophageal etiology.  Pt is afebrile and CXR is unconcerning.  Attempted to schedule MBSS to further evaluate swallow function; however,  pt is discharging to CIR this afternoon.  Recommend continuation of regular solids and thin liquid with instrumental swallow study if clinically indicated.   SLP Visit Diagnosis: Dysphagia, unspecified (R13.10)    Aspiration Risk  Mild aspiration risk    Diet Recommendation Dysphagia 3 (Mech soft);Thin liquid   Liquid Administration via: Straw;Cup Medication Administration: Whole meds with liquid Supervision: Patient able to self feed Compensations: Small sips/bites;Clear throat intermittently Postural Changes: Seated upright at 90 degrees;Remain upright for at least 30 minutes after po intake    Other  Recommendations Recommended Consults: Consider esophageal assessment Oral Care Recommendations: Oral care BID   Follow up Recommendations Inpatient Rehab      Frequency and Duration min 2x/week  2 weeks       Prognosis Prognosis for Safe Diet Advancement: Good Barriers to Reach Goals: Cognitive deficits      Swallow Study   General HPI: Carlos Ferguson is an 53 y.o. male with a history of spina bifida and hydrocephalus, s/Carlos shunt placement many years ago.  Pt presented with a 4 day history of nausea/vomiting/diarrhea and headache.  CT revealed a left cerebellar hemorrhage.   Type of Study: Bedside Swallow Evaluation Diet Prior to this Study: Regular;Thin liquids Respiratory Status: Room air History of Recent Intubation: No Behavior/Cognition: Alert;Cooperative;Pleasant mood Oral Cavity Assessment: Within Functional Limits Oral Care Completed by SLP: No Oral Cavity - Dentition: Adequate natural dentition Vision: Functional for self-feeding Self-Feeding Abilities: Able to feed self;Needs  set up Patient Positioning: Upright in bed Baseline Vocal Quality: Normal    Oral/Motor/Sensory Function Overall Oral Motor/Sensory Function: Within functional limits   Ice Chips Ice chips: Not tested   Thin Liquid Thin Liquid: Impaired Presentation: Self Fed;Cup;Straw Pharyngeal  Phase  Impairments: Wet Vocal Quality;Throat Clearing - Delayed;Cough - Delayed    Nectar Thick Nectar Thick Liquid: Impaired Presentation: Cup Pharyngeal Phase Impairments: Cough - Delayed   Honey Thick Honey Thick Liquid: Not tested   Puree Puree: Within functional limits Presentation: Self Fed;Spoon   Solid     Solid: Impaired Presentation: Self Fed Oral Phase Impairments: Impaired mastication      Carlos Ferguson Carlos Ferguson 12/28/2018,1:29 PM   Carlos Ferguson, M.S., Carlos Ferguson [facility] Services Office: [phone]

## 2018-12-28 NOTE — H&P (Addendum)
Physical Medicine and Rehabilitation Admission H&P    Chief Complaint  Patient presents with   Nausea   Weakness   HPI: Carlos Ferguson is a 53 year old male with history of spina bifida,  Chiari malformation s/p shunt, multiple BLE surgeries with right hip dysplasia/degeneration (has plans to be evaluated next week at [facility] for surgery) who was admitted on 12/25/2018 with 4 day history of N/V and headaches. CT head sone revealing 3 cm left cerebellar hematoma with severe hydrocephalus slightly increased from 2010 and shunt series showed fracture of shunt catheter.  MRI brain showed left cerebellar hemorrhage without underlying neoplasm or vascular malformations and numerous chronic micro bleeds likely due to amyloid angiopathy or hypertensive disease--chronic ventriculomegaly without significant worsening. Blood pressure elevated at admission --cleveprex started and bleed felt to be hypertensive in nature.  Dr. Ellene Route consulted due to concerns of shunt fracture and felt that hydrocephalus was not of concern.   CTA head/neck was negative for aneurysm and predominant fetal type origin of PCAs with diminutive VBS.  Echocardiogram with ejection fraction of 60-65%.  Impaired left ventricular relaxation. WOC consulted or large fungal rash on right posterior thigh and recommended nystatin powder for local care. Blood pressures have improved and now back on atenolol. Hypokalemia treated with extra dose of K dur and l leukocytosis improving. He did have worsening of HA with vomiting episode last night and CT head repeated early am showing no significant change in left cerebellar ICH with associated vasogenic edema.  Please see preadmission assessment from earlier today as well.   Review of Systems  Constitutional: Negative for chills and fever.  HENT: Positive for hearing loss (mild ). Negative for tinnitus.   Eyes: Negative for blurred vision and double vision.  Respiratory: Negative for cough and  shortness of breath.   Cardiovascular: Negative for palpitations.  Gastrointestinal: Negative for heartburn and nausea.       Hiccups since stroke  Genitourinary: Negative for dysuria and urgency.       Self caths every 4 hours.   Musculoskeletal: Positive for joint pain (right hip acetabular component has collapsed). Negative for falls.  Skin: Negative for itching and rash.  Neurological: Positive for sensory change (BLE from knees down), speech change (Loves to talk but now sounds wrong and can't get word out due to [place]), focal weakness and headaches. Negative for dizziness.  Psychiatric/Behavioral: The patient has insomnia (last night due to severe HA/Vomiting).     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     Past Surgical History:  Procedure Laterality Date   CSF SHUNT  1969   LLE surgery     Multiple surgeries for correction.    RLE surgery     surgery for correction     Family History  Problem Relation Age of Onset   Stroke Mother        light   Stroke Father    CAD Father    Heart murmur Sister      Social  History:  Lives alone. 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 He reports that he has never smoked. He has never used smokeless tobacco. He reports previous alcohol use. He reports that he does not use drugs.   Allergies  Allergen Reactions   Latex Anaphylaxis and Hives   Penicillins Hives and Itching    Medications Prior to Admission  Medication Sig Dispense Refill   acetaminophen (TYLENOL) 500 MG tablet Take 1,000 mg by mouth every 6 (six) hours as  needed for mild pain.     atenolol (TENORMIN) 50 MG tablet Take 50 mg by mouth every evening.     levETIRAcetam (KEPPRA) 500 MG tablet Take 1,500 mg by mouth 2 (two) times daily.     potassium chloride (KLOR-CON) 20 MEQ packet Take 20 mEq by mouth daily.      Drug Regimen Review  Drug regimen was reviewed and remains appropriate with no significant issues identified  Home: Home Living Family/patient expects to be discharged to:: Private residence Living Arrangements: Alone Available Help at Discharge: Family(Dad, 24 years old lives next door, can provide supervision l) Type of Home: House Home Access: Stairs to enter [employer] of Steps: (also has ramp through kitchen entrance) Entrance Stairs-Rails: Right Home Layout: One level Bathroom Shower/Tub: Tub/shower unit, Architectural technologist: Programmer, systems: Yes Home Equipment: Crutches, Wheelchair - manual  Lives With: Alone   Functional History: Prior Function Level of Independence: Independent with assistive device(s) Comments: Uses crutches and braces for ambulation (donns on floor). Drives. Does not cook or clean. 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 likes to collect corning ware and men's high end shoes  Functional Status:  Mobility: Bed Mobility Overal bed mobility: Needs Assistance Bed Mobility: Supine to Sit, Sit to Supine Rolling: Mod assist Sidelying to sit: Mod assist, HOB elevated, +2 for safety/equipment Supine to sit: Mod assist, +2 for physical assistance Sit to supine: Mod assist, +2 for physical assistance Sit to sidelying: Mod assist General bed mobility comments: Pt needed mod assist at trunk and legs to help progress to EOB and scoot to edge (feet did not touch due to short stature and high ICU bed). Transfers Overall transfer level: Needs assistance Equipment used: Crutches Transfers: Sit to/from Stand Sit to Stand: Mod assist, +2  safety/equipment, +2 physical assistance General transfer comment: Assist of 2 to stand from EOB with seat deflated (pt 4'9), not able to get fully upright. Weakness noted in BLEs. Ambulation/Gait General Gait Details: Unable due to weakness, stool.    ADL: ADL Overall ADL's : Needs assistance/impaired Eating/Feeding: Set up, Bed level Grooming: Set up, Bed level Upper Body Bathing: Minimal assistance, Moderate assistance, Sitting Lower Body Bathing: Maximal assistance, Sit to/from stand, +2 for physical assistance Upper Body Dressing : Minimal assistance, Moderate assistance, Sitting Lower Body Dressing: Moderate assistance, Maximal assistance, +2 for physical assistance, Sit to/from stand Lower Body Dressing Details (indicate cue type and reason): Pt donned socks, braces, and shoes with Mod A for sitting balance due to dizziness.  Toilet Transfer: Moderate assistance, +2 for physical assistance(sit<>Stand only at EOB) Toileting- Clothing Manipulation and Hygiene: Maximal assistance, Bed level Functional mobility during ADLs: Moderate assistance, +2 for physical assistance, Rolling walker(sit<>stand only at EOB) General ADL Comments: Pt presenting with dizziness,   Cognition: Cognition Overall Cognitive Status: Impaired/Different from baseline Arousal/Alertness: Awake/alert Orientation Level: Oriented X4 Attention: Sustained Sustained Attention: Impaired Sustained Attention Impairment: Verbal basic Memory: (scored intact on  subtest) Awareness: Appears intact Problem Solving: (suspect impaired to higher level) Executive Function: Sequencing, Organizing Safety/Judgment: Appears intact Cognition Arousal/Alertness: Awake/alert Behavior During Therapy: WFL for tasks assessed/performed Overall Cognitive Status: Impaired/Different from baseline General Comments: Generally oriented, not specifically tested.  Difficulty with complex vestibular exercises, but not sure if that is  cognition or visual  Physical Exam: Blood pressure (!) 179/87, pulse (!) 57, temperature 98.2 F (36.8 C), temperature source Oral, resp. rate 20, height 4\' 9"  (1.448 m), weight 65.3 kg, SpO2 100 %. Physical Exam  Nursing note and vitals reviewed. Constitutional: He appears well-developed and well-nourished.  Macrocephaly. Alert and appropriate. Occasional hiccups noted.   HENT:  Head: Normocephalic and atraumatic.  Eyes: Right eye exhibits no discharge. Left eye exhibits hordeolum. Left eye exhibits no discharge. Right eye exhibits abnormal extraocular motion and nystagmus. Left eye exhibits abnormal extraocular motion and nystagmus.  Neck: No tracheal deviation present. No thyromegaly present.  Respiratory: Effort normal. No respiratory distress.  GI: He exhibits no distension.  Musculoskeletal:     Comments: Left knee with multiple scars and deformity.  Bilateral feet well healed old incisions.   Mild lower extremity edema  Neurological: He is alert.  Dysarthria and ataxic speech and reports word finding deficits.   He was able to follow simple one step commands without difficulty and needed occasional cues with multi step commands.  Motor: Bilateral upper extremities: 4+/5 proximal distal No movement noted in bilateral lower extremities, however per report, paraplegia RLE>LLE with bilateral foot drop.   Skin:  Sacral dressing in place  Psychiatric: He has a normal mood and affect. His behavior is normal.    Results for orders placed or performed during the hospital encounter of 12/25/18 (from the past 48 hour(s))  Basic metabolic panel     Status: Abnormal   Collection Time: 12/27/18  3:45 AM  Result Value Ref Range   Sodium 137 135 - 145 mmol/L   Potassium 3.3 (L) 3.5 - 5.1 mmol/L   Chloride 105 98 - 111 mmol/L   CO2 22 22 - 32 mmol/L   Glucose, Bld 109 (H) 70 - 99 mg/dL   BUN 13 6 - 20 mg/dL   Creatinine, Ser 7.91 0.61 - 1.24 mg/dL   Calcium 8.9 8.9 - 50.5 mg/dL   GFR  calc non Af Amer >60 >60 mL/min   GFR calc Af Amer >60 >60 mL/min   Anion gap 10 5 - 15    Comment: Performed at [facility], [address] [place], [place] [address]  CBC     Status: Abnormal   Collection Time: 12/27/18  3:45 AM  Result Value Ref Range   WBC 10.6 (H) 4.0 - 10.5 K/uL   RBC 4.59 4.22 - 5.81 MIL/uL   Hemoglobin 14.3 13.0 - 17.0 g/dL   HCT 80.1 65.5 - 37.4 %   MCV 90.4 80.0 - 100.0 fL   MCH 31.2 26.0 - 34.0 pg   MCHC 34.5 30.0 - 36.0 g/dL   RDW 82.7 07.8 - 67.5 %   Platelets 223 150 - 400 K/uL   nRBC 0.0 0.0 - 0.2 %    Comment: Performed at [facility], [address], [place], [place] [address]  Hemoglobin A1c     Status: None   Collection Time: 12/27/18  3:45 AM  Result Value Ref Range   Hgb A1c MFr Bld 4.9 4.8 - 5.6 %    Comment: (NOTE) Pre diabetes:  5.7%-6.4% Diabetes:              >6.4% Glycemic control for   <7.0% adults with diabetes    Mean Plasma Glucose 93.93 mg/dL    Comment: Performed at [facility], [address] [place], [place] [address]  Lipid panel     Status: Abnormal   Collection Time: 12/27/18  3:45 AM  Result Value Ref Range   Cholesterol 100 0 - 200 mg/dL   Triglycerides 604100 <540<150 mg/dL   HDL 38 (L) >98>40 mg/dL   Total CHOL/HDL Ratio 2.6 RATIO   VLDL 20 0 - 40 mg/dL   LDL Cholesterol 42 0 - 99 mg/dL    Comment:        Total Cholesterol/HDL:CHD Risk Coronary Heart Disease Risk Table                     Men   Women  1/2 Average Risk   3.4   3.3  Average Risk       5.0   4.4  2 X Average Risk   9.6   7.1  3 X Average Risk  23.4   11.0        Use the calculated Patient Ratio above and the CHD Risk Table to determine the patient's CHD Risk.        ATP III CLASSIFICATION (LDL):  <100     mg/dL   Optimal  [id number]  mg/dL   Near or Above                    Optimal  130-159  mg/dL   Borderline  [id number]  mg/dL   High  >130>190     mg/dL   Very High Performed at [facility], [address]  [place], [place] [address]    Ct Head Wo Contrast  Result Date: 12/28/2018 CLINICAL DATA:  Initial evaluation for acute altered mental status, headache. EXAM: CT HEAD WITHOUT CONTRAST TECHNIQUE: Contiguous axial images were obtained from the base of the skull through the vertex without intravenous contrast. COMPARISON:  Prior CT from 12/25/2018. FINDINGS: Brain: Previously identified intraparenchymal hemorrhage centered at the left cerebellum is little interval changed in size in appearance, measuring 2.9 cm on today's exam. Associated localized vasogenic edema with mild regional mass effect is relatively similar as well. Partial effacement of the fourth ventricle and fourth ventricular outflow tract. Additional small subacute hemorrhages involving the anterior left frontal cortex (series 3, image 29 and periventricular right frontal white matter (series 3, image 24) are unchanged. No new intracranial hemorrhage. No acute large vessel territory infarct. Stigmata of Chiari 2 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 place with tip near the inter hemispheric fissure, stable. Marked ventriculomegaly is unchanged. No new finding. Vascular: No appreciable hyperdense vessel. Skull: Scalp soft tissues and calvarium demonstrate no acute finding. Sinuses/Orbits: Globes and orbital soft tissues within normal limits. Mild mucosal thickening noted within the ethmoidal air cells and maxillary sinuses. No mastoid effusion. Other: None. IMPRESSION: 1. No significant interval change in left cerebellar intraparenchymal hemorrhage with associated localized vasogenic edema. No other new acute intracranial abnormality. 2. Sequelae of Chiari 2 malformation with chronic ventriculomegaly and right occipital shunt catheter in place, stable. Electronically Signed   By: Rise MuBenjamin  McClintock M.D.   On: 12/28/2018 04:00    Medical Problem List and Plan: 1.  Deficits with mobility, transfers, self-care secondary to  left cerebellar hemorrhage with history  of strokes  Admit to CIR 2.  Antithrombotics: -DVT/anticoagulation:  Mechanical: Foot pump Bilateral lower extremities  -antiplatelet therapy: N/A due to bleed 3. Headaches/Pain Management: will add ultram prn.  4. Mood: LCSW to follow for evaluation and support.   -antipsychotic agents: N/A 5. Neuropsych: This patient is?  Fully capable of making decisions on his own behalf. 6. Skin/Wound Care: Routine pressure relief measures.  7. Fluids/Electrolytes/Nutrition: Monitor I/Os. Tolerating current diet.  8. Headaches: Persistent but appears to be improving.  Depakote added today.   9. Leucocytosis: Monitor for signs of infection.  CBC ordered for tomorrow a.m. 10. Hypokalemia: Continue daily supplement and adjust as needed.  CMP ordered for tomorrow a.m. 11. HTN: Monitor BP tid--continue atenolol   Monitor with increased mobility 12. Seizure disorder: On Keppra 1500 mg bid.  Monitor reoccurrence.  Jacquelynn Creeamela S Love, PA-C 12/28/2018

## 2018-12-28 NOTE — Progress Notes (Signed)
Patient is discharging to CIR. Report has been called. Family at bedside. All belongings will be sent with patient. [place]

## 2018-12-28 NOTE — Care Management Important Message (Signed)
Important Message  Patient Details  Name: Carlos Ferguson MRN: [id number] Date of Birth: 11/28/1965   Medicare Important Message Given:  Yes     Melody Cirrincione 12/28/2018, 1:48 PM

## 2018-12-28 NOTE — Progress Notes (Signed)
Jamse Arn, MD  Physician  Physical Medicine and Rehabilitation  PMR Pre-admission  Signed  Date of Service:  12/28/2018 10:25 AM      Related encounter: ED to Hosp-Admission (Discharged) from 12/25/2018 in [facility]      Signed         Show:Clear all [x] Manual[x] Template[x] Copied  Added by: [x] Julious Payer Vertis Kelch, RN[x] Jamse Arn, MD  [] Hover for details PMR Admission Coordinator Pre-Admission Assessment  Patient: Carlos Ferguson is an 53 y.o., male MRN: [id number] DOB: [birth year] Height: 4' 9"  (144.8 cm) Weight: 65.3 kg  Insurance Information HMO:     PPO:      PCP:      IPA:      80/20:     OTHER:  PRIMARY: Medicare a and b      Policy#: [id number]      Subscriber: pt Benefits:  Phone #: passport one online Eff. Date: a 08/26/1999 and b 08/26/2006     Deduct: $1408      Out of Pocket Max: none      Life Max: none CIR: 100%      SNF: 20 full days Outpatient: 80%     Co-Pay: 20% Home Health: 100%      Co-Pay: none DME: 80%     Co-Pay: 20% Providers: pt choice  SECONDARY: none       Medicaid Application Date:       Case Manager:  Disability Application Date:      Case Worker:   The "Data Collection Information Summary" for patients in Inpatient Rehabilitation Facilities with attached "Privacy Act [place] Records" was provided and verbally reviewed with: Patient and Family  Emergency Contact Information         Contact Information    Name Relation Home Work Mobile   Pfefferle,PHILLIP    [phone]      Current Medical History  Patient Admitting Diagnosis: ICH; h/o spina bifida  History of Present Illness: 53 year old with history of spina bifida and hydrocephalus with shunt placement, and HTN. Presented on 12/25/2018 with a 4 day history of N/V. On arrival to the ED CT revealed a left cerebellar hemorrhage. Also note on CT was a ventricular shunt and interval enlargement of the ventricles relative to a  prior CT head obtained in 2010. Shunt series revealed fracture of the shunt catheter consistent with shunt failure.  Neurology felt hemorrhage secondary to indeterminate etiology possibly related to hypertension. CTA head and neck no ELVO. Minor atherosclerosis aortic arch and L ICA bifurcation. 2 d echo ef 60 - 65 %. No source of embolus. LDL 42. Hgb A1c 4.9. No SCDs used due to red legs. Added Heparin tid as stable post hemorrhage. On antithrombotic prior to admit.   Tylenol for headache. On atenolol pta for HTN. Treated with cleviprex and weaned off in ICU. Resumed atenolol and given prn labetalol. Goal SBP < 160.   Dr Ellene Route with Neurosurgery consulted due to nonfunctioning VP SHUNT. No plans to adjust. Observe clinically, but no plans for correction at this time. 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 Seizure d/o on Keppra. B back of thigh/groin redness with central clearing. Felt fungal so started on antifungal powder. WOC consulted.   Complete NIHSS TOTAL: 8  Patient's medical record from [facility]  has been reviewed by the rehabilitation admission coordinator and physician.  Past Medical History      Past Medical History:  Diagnosis  Date  . Self-catheterizes urinary bladder   . Spina bifida [facility])     Family History   family history is not on file.  Prior Rehab/Hospitalizations Has the patient had prior rehab or hospitalizations prior to admission? Yes  Has the patient had major surgery during 100 days prior to admission? No              Current Medications  Current Facility-Administered Medications:  .  acetaminophen (TYLENOL) tablet 650 mg, 650 mg, Oral, Q4H PRN, 650 mg at 12/28/18 5374 **OR** acetaminophen (TYLENOL) solution 650 mg, 650 mg, Per Tube, Q4H PRN **OR** acetaminophen (TYLENOL) suppository 650 mg, 650 mg, Rectal, Q4H PRN, Kerney Elbe, MD .  atenolol (TENORMIN) tablet 50 mg, 50 mg, Oral, Daily, Kerney Elbe, MD, 50  mg at 12/27/18 1002 .  Chlorhexidine Gluconate Cloth 2 % PADS 6 each, 6 each, Topical, Daily, Kerney Elbe, MD, 6 each at 12/27/18 1003 .  heparin injection 5,000 Units, 5,000 Units, Subcutaneous, Q8H, Biby, Sharon L, NP, 5,000 Units at 12/28/18 [phone] .  labetalol (NORMODYNE) injection 20 mg, 20 mg, Intravenous, Q2H PRN, Garvin Fila, MD, 20 mg at 12/28/18 0837 .  levETIRAcetam (KEPPRA) tablet 1,500 mg, 1,500 mg, Oral, BID, Kerney Elbe, MD, 1,500 mg at 12/27/18 2231 .  nystatin (MYCOSTATIN/NYSTOP) topical powder, , Topical, BID, Biby, Sharon L, NP .  ondansetron (ZOFRAN) 4 MG/2ML injection, , , ,  .  pantoprazole (PROTONIX) EC tablet 40 mg, 40 mg, Oral, QHS, Garvin Fila, MD, 40 mg at 12/27/18 2231 .  potassium chloride SA (K-DUR) CR tablet 20 mEq, 20 mEq, Oral, Daily, Kerney Elbe, MD, 20 mEq at 12/27/18 1003 .  senna-docusate (Senokot-S) tablet 1 tablet, 1 tablet, Oral, BID, Kerney Elbe, MD  Patients Current Diet:     Diet Order                  Diet Heart Room service appropriate? Yes with Assist; Fluid consistency: Thin  Diet effective now               Precautions / Restrictions Precautions Precautions: Fall Precaution Comments: nystagmus and dizziness, watch BP Restrictions Weight Bearing Restrictions: No   Has the patient had 2 or more falls or a fall with injury in the past year? No  Prior Activity Level Community (5-7x/wk): Mod I with crutches and BLE braces; drives  Prior Functional Level Self Care: Did the patient need help bathing, dressing, using the toilet or eating? Independent  Indoor Mobility: Did the patient need assistance with walking from room to room (with or without device)? Independent  Stairs: Did the patient need assistance with internal or external stairs (with or without device)? Independent  Functional Cognition: Did the patient need help planning regular tasks such as shopping or remembering to take medications?  Independent  Home Assistive Devices / Equipment Home Assistive Devices/Equipment: Crutches, Eyeglasses Home Equipment: Crutches, Wheelchair - manual  Prior Device Use: Indicate devices/aids used by the patient prior to current illness, exacerbation or injury? bilateral LE braces and crutches  Current Functional Level Cognition  Arousal/Alertness: Awake/alert Overall Cognitive Status: Impaired/Different from baseline Orientation Level: Oriented X4 General Comments: Generally oriented, not specifically tested.  Difficulty with complex vestibular exercises, but not sure if that is cognition or visual Attention: Sustained Sustained Attention: Impaired Sustained Attention Impairment: Verbal basic Memory: (scored intact on subtest) Awareness: Appears intact Problem Solving: (suspect impaired to higher level) Executive Function: Sequencing, Organizing Safety/Judgment: Appears intact    Extremity Assessment (  includes Sensation/Coordination)  Upper Extremity Assessment: Generalized weakness  Lower Extremity Assessment: Defer to PT evaluation RLE Deficits / Details: Hx of spina bifida; limited knee/ankle AROM, limited hip extension. Limited assessment due to stool and dizziness. LLE Deficits / Details: Hx of spina bifida, limited ankle AROM, no knee flexion AROM, limited hip extension. Limited assessment due to stool and dizziness.    ADLs  Overall ADL's : Needs assistance/impaired Eating/Feeding: Set up, Bed level Grooming: Set up, Bed level Upper Body Bathing: Minimal assistance, Moderate assistance, Sitting Lower Body Bathing: Maximal assistance, Sit to/from stand, +2 for physical assistance Upper Body Dressing : Minimal assistance, Moderate assistance, Sitting Lower Body Dressing: Moderate assistance, Maximal assistance, +2 for physical assistance, Sit to/from stand Lower Body Dressing Details (indicate cue type and reason): Pt donned socks, braces, and shoes with Mod A for  sitting balance due to dizziness.  Toilet Transfer: Moderate assistance, +2 for physical assistance(sit<>Stand only at EOB) Toileting- Clothing Manipulation and Hygiene: Maximal assistance, Bed level Functional mobility during ADLs: Moderate assistance, +2 for physical assistance, Rolling walker(sit<>stand only at EOB) General ADL Comments: Pt presenting with dizziness,     Mobility  Overal bed mobility: Needs Assistance Bed Mobility: Supine to Sit, Sit to Supine Rolling: Mod assist Sidelying to sit: Mod assist, HOB elevated, +2 for safety/equipment Supine to sit: Mod assist, +2 for physical assistance Sit to supine: Mod assist, +2 for physical assistance Sit to sidelying: Mod assist General bed mobility comments: Pt needed mod assist at trunk and legs to help progress to EOB and scoot to edge (feet did not touch due to short stature and high ICU bed).    Transfers  Overall transfer level: Needs assistance Equipment used: Crutches Transfers: Sit to/from Stand Sit to Stand: Mod assist, +2 safety/equipment, +2 physical assistance General transfer comment: Assist of 2 to stand from EOB with seat deflated (pt 4'9), not able to get fully upright. Weakness noted in BLEs.    Ambulation / Gait / Stairs / Wheelchair Mobility  Ambulation/Gait General Gait Details: Unable due to weakness, stool.    Posture / Balance Dynamic Sitting Balance Sitting balance - Comments: pt had periods of close supervision, when scooting required min assist for balance as he had increased sway when moving his body or his head.  Balance Overall balance assessment: Needs assistance Sitting-balance support: Feet supported, Bilateral upper extremity supported, Single extremity supported Sitting balance-Leahy Scale: Fair Sitting balance - Comments: pt had periods of close supervision, when scooting required min assist for balance as he had increased sway when moving his body or his head.  Postural control:  Posterior lean Standing balance support: During functional activity, Bilateral upper extremity supported Standing balance-Leahy Scale: Poor Standing balance comment: Requires assist of 2 for standing.    Special needs/care consideration BiPAP/CPAP n/a CPM  N/a Continuous Drip IV  N/a Dialysis n/a Life Vest  N/a Oxygen  N/a Special Bed  N/a Trach Size  N/a Wound Vac n/a Skin      Meryle Ready, RN  Registered Nurse  WOC  Consult Note   Signed   Date of Service:  12/26/2018 12:07 PM            Signed         Show:Clear all [x] ?Manual[x] ?Template[] ?Copied  Added by: [x] ?Meryle Ready, RN  [] ?Hover for details [place] Nurse wound consult note Patient receiving care in [place] Reason for Consult:rash on legs Wound type:Large patch of red fungal rash on posterior right thigh/hip and  RLE with satellite lesions MD had already ordered Nystatin powder 2 times daily. Please continue this therapy. Monitor the wound area(s) for worsening of condition such as: Signs/symptoms of infection,  Increase in size,  Development of or worsening of odor, Development of pain, or increased pain at the affected locations. Notify the medical team if any of these develop.  Thank you for the consult. Discussed plan of care with the patient and bedside nurse. [place] nurse will not follow at this time. Please re-consult the [place] team if needed.  Val Riles, RN, MSN, CWOCN, CNS-BC, pager [phone]          Bowel mgmt:  patient states he wears depends at home. Takes imodium prn to keep from having leakage and then suppository when he becomes constipate. He would like assistance in developing a bowel program to obtain ability to be continent Bladder mgmt:  Patient self caths q 4 hrs at home pta Diabetic mgmt: n/a Behavioral consideration n/a Chemo/radiation  N/a Patient's Dad, Doren Custard is his visitor designee   Previous Home Environment  Living Arrangements: Alone   Lives With: Alone Available Help at Discharge: Family(Dad, 36 years old lives next door, can provide supervision l) Type of Home: [place]: One level Home Access: Stairs to enter Entrance Stairs-Rails: Right Entrance Stairs-Number of Steps: (also has ramp through kitchen entrance) Bathroom Shower/Tub: Tub/shower unit, Architectural technologist: Standard Bathroom Accessibility: Yes How Accessible: Accessible via walker [place]: No  Discharge Living Setting Plans for Discharge Living Setting: Patient's home, Alone Type of Home at Discharge: House Discharge Home Layout: One level Discharge Home Access: Stairs to enter, Ramped entrance Entrance Stairs-Rails: Right Entrance Stairs-Number of Steps: 4 steps but also ramp thought kitchen entrance Discharge Bathroom Shower/Tub: Tub/shower unit, Curtain Discharge Bathroom Toilet: Standard Discharge Bathroom Accessibility: Yes How Accessible: Accessible via walker Does the patient have any problems obtaining your medications?: No  Social/Family/Support Systems Contact Information: [place] Anticipated Caregiver: Dad Anticipated Caregiver's Contact Information: [phone] Ability/Limitations of Caregiver: 53 year old. does not use AD but walks slow Caregiver Availability: 24/7 Discharge Plan Discussed with Primary Caregiver: Yes Is Caregiver In Agreement with Plan?: Yes Does Caregiver/Family have Issues with Lodging/Transportation while Pt is in Rehab?: No  Goals/Additional Needs Patient/Family Goal for Rehab: Mod I to superivison with PT, OT, and SLP Expected length of stay: ELOS 10 to 14 days Special Service Needs: Patient completes self caths q 4 hra; requests assistance with making a bowel regimen;  Additional Information: VP shunt non functional Pt/Family Agrees to Admission and willing to participate: Yes Program Orientation Provided & Reviewed with Pt/Caregiver Including Roles  & Responsibilities: Yes   Decrease burden of Care through IP rehab admission: n/a  Possible need for SNF placement upon discharge:  Not anticipated  Patient Condition: I have reviewed medical records from [facility] , spoken with CM, and patient and family member, Dad. I met with patient at the bedside for inpatient rehabilitation assessment.  Patient will benefit from ongoing PT, OT and SLP, can actively participate in 3 hours of therapy a day 5 days of the week, and can make measurable gains during the admission.  Patient will also benefit from the coordinated team approach during an Inpatient Acute Rehabilitation admission.  The patient will receive intensive therapy as well as Rehabilitation physician, nursing, social worker, and care management interventions.  Due to bladder management, bowel management, safety, skin/wound care, disease management, medication administration, pain management and patient education the patient requires  24 hour a day rehabilitation nursing.  The patient is currently mod to max assist with mobility and basic ADLs.  Discharge setting and therapy post discharge at home with home health is anticipated.  Patient has agreed to participate in the Acute Inpatient Rehabilitation Program and will admit today.  Preadmission Screen Completed By:  Cleatrice Burke, 12/28/2018 10:25 AM ______________________________________________________________________   Discussed status with Dr. Posey Pronto  on  12/28/2018 at  1045 and received approval for admission today.  Admission Coordinator:  Cleatrice Burke, RN MSN, time 1045 Date  12/28/2018   Assessment/Plan: Diagnosis: ICH; h/o spina bifida  1. Does the need for close, 24 hr/day Medical supervision in concert with the patient's rehab needs make it unreasonable for this patient to be served in a less intensive setting? Yes  2. Co-Morbidities requiring supervision/potential complications: spina bifida and hydrocephalus with shunt  placement, and HTN, hypokalemia.  3. Due to safety, disease management and patient education, does the patient require 24 hr/day rehab nursing? Yes 4. Does the patient require coordinated care of a physician, rehab nurse, PT (1-2 hrs/day, 5 days/week), OT (1-2 hrs/day, 5 days/week) and SLP (1-2 hrs/day, 5 days/week) to address physical and functional deficits in the context of the above medical diagnosis(es)? Yes Addressing deficits in the following areas: balance, endurance, locomotion, strength, transferring, bathing, dressing, toileting, cognition and psychosocial support 5. Can the patient actively participate in an intensive therapy program of at least 3 hrs of therapy 5 days a week? Yes 6. The potential for patient to make measurable gains while on inpatient rehab is excellent 7. Anticipated functional outcomes upon discharge from inpatients are: min assist PT, min assist OT, supervision SLP 8. Estimated rehab length of stay to reach the above functional goals is: 12-16 days. 9. Anticipated D/C setting: Home 10. Anticipated post D/C treatments: HH therapy and Home excercise program 11. Overall Rehab/Functional Prognosis: good  MD Signature: Delice Lesch, MD, ABPMR        Revision History

## 2018-12-28 NOTE — Discharge Summary (Addendum)
Stroke Discharge Summary  Patient ID: Carlos Ferguson    l   MRN: [id number]      DOB: Sep 21, 1965  Date of Admission: 12/25/2018 Date of Discharge: 12/28/2018  Attending Physician:  Micki RileySethi, Pramod S, MD, Stroke MD Consultant(s):   Barnett AbuHenry Elsner, MD (NSY), WOC RN Patient's PCP:  Patient, No Pcp Per  Discharge Diagnoses:  Principal Problem:   ICH (intracerebral hemorrhage) (HCC) L cerebellar, etiology ? HTN Active Problems:   Cytotoxic brain edema (HCC)   Essential hypertension   Obesity   Spina bifida (HCC)   Urinary retention   Seizures (HCC)   Hypokalemia  Past Medical History:  Diagnosis Date  . Self-catheterizes urinary bladder   . Spina bifida [facility](HCC)    Past Surgical History:  Procedure Laterality Date  . CSF SHUNT  1969    Medications to be continued on Rehab Allergies as of 12/28/2018      Reactions   Latex Anaphylaxis, Hives   Penicillins Hives, Itching      Medication List    TAKE these medications   acetaminophen 500 MG tablet Commonly known as: TYLENOL Take 1,000 mg by mouth every 6 (six) hours as needed for mild pain.   atenolol 25 MG tablet Commonly known as: TENORMIN Take 1 tablet (25 mg total) by mouth once for 1 dose. What changed: You were already taking a medication with the same name, and this prescription was added. Make sure you understand how and when to take each.   atenolol 25 MG tablet Commonly known as: TENORMIN Take 3 tablets (75 mg total) by mouth daily. Start taking on: December 29, 2018 What changed:   medication strength  how much to take  when to take this   Chlorhexidine Gluconate Cloth 2 % Pads Apply 6 each topically daily.   heparin 5000 UNIT/ML injection Inject 1 mL (5,000 Units total) into the skin every 8 (eight) hours.   labetalol 5 MG/ML injection Commonly known as: NORMODYNE Inject 4 mLs (20 mg total) into the vein every 2 (two) hours as needed (SPB <140 for 24 hours, then <160).   levETIRAcetam 500 MG  tablet Commonly known as: KEPPRA Take 1,500 mg by mouth 2 (two) times daily.   nystatin powder Commonly known as: MYCOSTATIN/NYSTOP Apply topically 2 (two) times daily.   potassium chloride 20 MEQ packet Commonly known as: KLOR-CON Take 20 mEq by mouth daily.   senna-docusate 8.6-50 MG tablet Commonly known as: Senokot-S Take 1 tablet by mouth 2 (two) times daily.       LABORATORY STUDIES CBC    Component Value Date/Time   WBC 10.6 (H) 12/27/2018 0345   RBC 4.59 12/27/2018 0345   HGB 14.3 12/27/2018 0345   HCT 41.5 12/27/2018 0345   PLT 223 12/27/2018 0345   MCV 90.4 12/27/2018 0345   MCH 31.2 12/27/2018 0345   MCHC 34.5 12/27/2018 0345   RDW 14.1 12/27/2018 0345   LYMPHSABS 0.8 12/25/2018 1623   MONOABS 0.8 12/25/2018 1623   EOSABS 0.0 12/25/2018 1623   BASOSABS 0.0 12/25/2018 1623   CMP    Component Value Date/Time   NA 137 12/27/2018 0345   K 3.3 (L) 12/27/2018 0345   CL 105 12/27/2018 0345   CO2 22 12/27/2018 0345   GLUCOSE 109 (H) 12/27/2018 0345   BUN 13 12/27/2018 0345   CREATININE 0.72 12/27/2018 0345   CALCIUM 8.9 12/27/2018 0345   PROT 7.0 12/25/2018 1623   ALBUMIN 3.8 12/25/2018 1623   AST 18 12/25/2018  1623   ALT 24 12/25/2018 1623   ALKPHOS 60 12/25/2018 1623   BILITOT 0.6 12/25/2018 1623   GFRNONAA >60 12/27/2018 0345   GFRAA >60 12/27/2018 0345   COAGSNo results found for: INR, PROTIME Lipid Panel    Component Value Date/Time   CHOL 100 12/27/2018 0345   TRIG 100 12/27/2018 0345   HDL 38 (L) 12/27/2018 0345   CHOLHDL 2.6 12/27/2018 0345   VLDL 20 12/27/2018 0345   LDLCALC 42 12/27/2018 0345   HgbA1C  Lab Results  Component Value Date   HGBA1C 4.9 12/27/2018   Urinalysis    Component Value Date/Time   COLORURINE YELLOW 12/25/2018 1623   APPEARANCEUR CLEAR 12/25/2018 1623   LABSPEC 1.023 12/25/2018 1623   PHURINE 5.0 12/25/2018 1623   GLUCOSEU NEGATIVE 12/25/2018 1623   HGBUR NEGATIVE 12/25/2018 1623   BILIRUBINUR NEGATIVE  12/25/2018 1623   KETONESUR 5 (A) 12/25/2018 1623   PROTEINUR NEGATIVE 12/25/2018 1623   NITRITE NEGATIVE 12/25/2018 1623   LEUKOCYTESUR NEGATIVE 12/25/2018 1623    SIGNIFICANT DIAGNOSTIC STUDIES Ct Head Wo Contrast 12/25/2018 1650 1. 3 cm LEFT cerebellar hematoma. 2. Severe hydrocephalus, slightly increased since 2010. Unchanged RIGHT posterior parietal ventriculostomy catheter with tip at the midline.   Ct Angio Head W Or Wo Contrast Ct Angio Neck W Or Wo Contrast 12/25/2018 2052 1. Negative CTA of the head and neck. No aneurysm or other vascular abnormality seen underlying the left cerebellar hematoma. 2. Minor atherosclerotic change for patient age about the aortic arch and left carotid bifurcation. No large vessel occlusion or hemodynamically significant stenosis. 3. Predominant fetal type origin of the PCAs with overall diminutive vertebrobasilar system. 4. Hypoplastic/absent right A1, with the anterior cerebral artery supplied via the left carotid artery system.   [name] Wo Contrast 12/25/2018 2208 MRI brain without and with contrast is consistent with a spontaneous LEFT 2.5 cm cerebellar hemorrhage, without underlying neoplasm or vascular malformation. Given the numerous other chronic microbleeds throughout the brain, the observed hemorrhage likely relates to either amyloid angiopathy or hypertensive cerebrovascular disease. Stigmata of Chiari II malformation, with chronic ventriculomegaly, not significantly worse compared with 2010 cross-sectional imaging. Continued surveillance is warranted.   Dg Skull 1-3 Views 12/25/2018 1730 Right parietal approach ventricular shunt catheter terminates posteriorly in the midline. No unexpected shunt kink or discontinuity in the head or neck.   Dg Chest 1 View 12/25/2018 1735 Right-sided VP shunt catheter courses inferiorly from the right neck into the medial right chest, and is not clearly visualized below the level of the T6 level,  concerning for shunt catheter fracture. Please see the separate concurrent abdominal radiograph report for further details. A CT of the chest, abdomen and pelvis could be obtained to confirm this finding, as clinically warranted.   Chest 2 View 12/25/2018 2028 No evidence of acute cardiopulmonary disease.   Dg Abd 1 View 12/25/2018 1739 Fractured shunt catheter fragment coiled in the deep pelvis, which does not communicate with the shunt catheter in the right neck and upper chest. Nonobstructive bowel gas pattern.   2D Echocardiogram 1. There is dilatation of the aortic root. 2. The left ventricle has normal systolic function with an ejection fraction of 60-65%. The cavity size was normal. Left ventricular diastolic Doppler parameters are consistent with impaired relaxation. No evidence of left ventricular regional wall  motion abnormalities. 3. The aortic valve is tricuspid. Aortic valve regurgitation is mild by color flow Doppler. No stenosis of the aortic valve. 4. The right ventricle  has normal systolic function. The cavity was normal. There is no increase in right ventricular wall thickness. 5. No evidence of mitral valve stenosis. No significant mitral regurgitation. 6. The IVC was not visualized. No complete TR doppler jet so unable to estimate PA systolic pressure.     HISTORY OF PRESENT ILLNESS Carlos Ferguson is an 53 y.o. male with a history of spina bifida and hydrocephalus, s/p shunt placement many years ago who presents with a 4 day history of N/V. On arrival to the ED, CT was obtained revealing a left cerebellar hemorrhage.  Also noted on CT was a ventricular shunt and interval enlargement of the ventricles (increased ventriculomegaly) relative to the prior CT head obtained in 2010. Shunt series revealed fracture of the shunt catheter consistent with shunt failure.   HOSPITAL COURSE Mr. Carlos Ferguson is a 53 y.o. male with history of spina bifida and hydrocephalus,  s/p shunt placement many years ago  presenting with 4 day hx N/V.   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 Severe hydrocephalus. R post parietal ventriculostomy w/ tip midline  CTA head & neck no ELVO. Minor atherosclerosis aortic arch and L ICA bifurcation.  MRI  L cerebellar hemorrhage. No underlying abnormality. Chronic microbleeds throughout. 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 No source of embolus    LDL 42  HgbA1c 4.9  VTE prophylaxis Heparin 5000 units sq tid   No antithrombotic prior to admission, now on No antithrombotic given hmg  Therapy recommendations:  CIR  Disposition:  CIR  Headache secondary to stroke  Tylenol for pain   Hypertension  Home meds:  Atenolol 50 daily  Treated with cleviprex in ICU  Resumed atenolol  BP > 180 overnight with prn labetalol   Will increase atenolol to 75 mg daily. As has already received 50 today, will given an additional 25 today.   BP goal now normotensive  Other Stroke Risk Factors  Hx ETOH use, alcohol level No results found  Obesity, Body mass index is Body mass index is 31.15 kg/m. recommend weight loss, diet and exercise as appropriate   Other Active Problems  Spina bifida w/ hx CSF shunt not currently functional, ventriculomegaly stable. R VP shunt into R chest. Fractured shunt fragment coiled in deep pelvix w/o communication to cath in neck. NSY - Elsner consulted.  Urinary retention, self caths at home  Seizure d/o on keppra  Hypokalemia on K 20 meq daily -> 3.3. give extra dose K - f/u on CIR  LE w/ symmetric redness and loss of care. Seems to be related to braces  B back of thigh/groin redness w/ central clearing. Looks fungal. Started on antifungal powder. WOC consult placed by Dr. Pearlean Brownie.  Mild leukocytosis 12.3 w/ low grade temperature 9.9.  monitor ->10.6, remains afebrile   DISCHARGE EXAM Blood pressure (!) 180/77, pulse (!) 53, temperature 98.2 F (36.8 C), temperature source Oral, resp. rate 20, height 4\' 9"  (1.448 m), weight 65.3 kg, SpO2 100 %. Middle-aged Caucasian male with abnormal faces with hypertelorism and anterior posterior elongated scalp with decrease horizontal diameter.  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  He has multiple surgical scars in the leg.  There is erythematous rash over the posterior medial upper thighs which is likely fungal with has central clearing and satellite lesions. Cardiac exam no murmur or  gallop. Lungs clear to auscultation. There is trace pedal edema and some erythema over the fingers.  Both feet have fixed lateral rotation.  Neurological Exam :  Awake alert oriented to time place and person.  Speech is clear without aphasia or dysarthria.  Extraocular movements are full range he does have pendular nystagmus in all directions.  He does have saccadic dysmetria.  There is slight esotropia of the left eye.  Pupils equal reactive. Fundi not visualized.  Face is symmetric without weakness.  Tongue midline.  Motor system exam no upper extremity drift with symmetric and equal strength in both upper extremities finger-to-nose coordination is slow but accurate in upper extremities.  He has paraparesis with right lower extremity more weak than the left.  He had 2/5 strength in proximal right lower extremity and 1/5 strength distally.  Left lower extremity strength is 2-3/5 proximally and 1/5 distally.  Deep tendon flexes absent in lower extremities and present in upper extremities.  Sensation is intact in upper extremities but diminished in both lower extremities.  Plantars are both not elicitable.  Gait not tested.  Discharge Diet  Heart healthy thin liquids  DISCHARGE PLAN  Disposition:  [facility] ongoing PT, OT and ST  Due  to hemorrhage and risk of bleeding, do not take aspirin, aspirin-containing medications, or ibuprofen products   Recommend ongoing stroke risk factor control by Primary Care Physician at time of discharge from inpatient rehabilitation.  Follow-up PCP in 2 weeks following discharge from rehab.  Follow-up in [facility] in 4 weeks following discharge from rehab, office to schedule an appointment.   30 minutes were spent preparing discharge.  Annie MainSharon Biby, MSN, APRN, ANVP-BC, AGPCNP-BC Advanced Practice Stroke Nurse [facility] See Amion for Schedule & Pager information 12/28/2018 10:50 AM  I have personally obtained history,examined this patient, reviewed notes, independently viewed imaging studies, participated in medical decision making and plan of care.ROS completed by me personally and pertinent positives fully documented  I have made any additions or clarifications directly to the above note. Agree with note above.   Delia HeadyPramod Sethi, MD Medical Director [facility] Pager: [phone] 12/28/2018 2:18 PM

## 2018-12-28 NOTE — H&P (Signed)
Physical Medicine and Rehabilitation Admission H&P    Chief Complaint  Patient presents with   Nausea   Weakness   HPI: Carlos Ferguson is a 53 year old male with history of spina bifida,  Chiari malformation s/p shunt, multiple BLE surgeries with right hip dysplasia/degeneration (has plans to be evaluated next week at [facility] for surgery) who was admitted on 12/25/2018 with 4 day history of N/V and headaches. CT head sone revealing 3 cm left cerebellar hematoma with severe hydrocephalus slightly increased from 2010 and shunt series showed fracture of shunt catheter.  MRI brain showed left cerebellar hemorrhage without underlying neoplasm or vascular malformations and numerous chronic micro bleeds likely due to amyloid angiopathy or hypertensive disease--chronic ventriculomegaly without significant worsening. Blood pressure elevated at admission --cleveprex started and bleed felt to be hypertensive in nature.  Dr. Ellene Route consulted due to concerns of shunt fracture and felt that hydrocephalus was not of concern.   CTA head/neck was negative for aneurysm and predominant fetal type origin of PCAs with diminutive VBS.  Echocardiogram with ejection fraction of 60-65%.  Impaired left ventricular relaxation. WOC consulted or large fungal rash on right posterior thigh and recommended nystatin powder for local care. Blood pressures have improved and now back on atenolol. Hypokalemia treated with extra dose of K dur and l leukocytosis improving. He did have worsening of HA with vomiting episode last night and CT head repeated early am showing no significant change in left cerebellar ICH with associated vasogenic edema.  Please see preadmission assessment from earlier today as well.   Review of Systems  Constitutional: Negative for chills and fever.  HENT: Positive for hearing loss (mild ). Negative for tinnitus.   Eyes: Negative for blurred vision and double vision.  Respiratory: Negative for cough and  shortness of breath.   Cardiovascular: Negative for palpitations.  Gastrointestinal: Negative for heartburn and nausea.       Hiccups since stroke  Genitourinary: Negative for dysuria and urgency.       Self caths every 4 hours.   Musculoskeletal: Positive for joint pain (right hip acetabular component has collapsed). Negative for falls.  Skin: Negative for itching and rash.  Neurological: Positive for sensory change (BLE from knees down), speech change (Loves to talk but now sounds wrong and can't get word out due to [place]), focal weakness and headaches. Negative for dizziness.  Psychiatric/Behavioral: The patient has insomnia (last night due to severe HA/Vomiting).     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     Past Surgical History:  Procedure Laterality Date   CSF SHUNT  1969   LLE surgery     Multiple surgeries for correction.    RLE surgery     surgery for correction     Family History  Problem Relation Age of Onset   Stroke Mother        light   Stroke Father    CAD Father    Heart murmur Sister      Social  History:  Lives alone. 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 He reports that he has never smoked. He has never used smokeless tobacco. He reports previous alcohol use. He reports that he does not use drugs.   Allergies  Allergen Reactions   Latex Anaphylaxis and Hives   Penicillins Hives and Itching    Medications Prior to Admission  Medication Sig Dispense Refill   acetaminophen (TYLENOL) 500 MG tablet Take 1,000 mg by mouth every 6 (six) hours as  needed for mild pain.     atenolol (TENORMIN) 50 MG tablet Take 50 mg by mouth every evening.     levETIRAcetam (KEPPRA) 500 MG tablet Take 1,500 mg by mouth 2 (two) times daily.     potassium chloride (KLOR-CON) 20 MEQ packet Take 20 mEq by mouth daily.      Drug Regimen Review  Drug regimen was reviewed and remains appropriate with no significant issues identified  Home: Home Living Family/patient expects to be discharged to:: Private residence Living Arrangements: Alone Available Help at Discharge: Family(Dad, 24 years old lives next door, can provide supervision l) Type of Home: House Home Access: Stairs to enter [employer] of Steps: (also has ramp through kitchen entrance) Entrance Stairs-Rails: Right Home Layout: One level Bathroom Shower/Tub: Tub/shower unit, Architectural technologist: Programmer, systems: Yes Home Equipment: Crutches, Wheelchair - manual  Lives With: Alone   Functional History: Prior Function Level of Independence: Independent with assistive device(s) Comments: Uses crutches and braces for ambulation (donns on floor). Drives. Does not cook or clean. 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 likes to collect corning ware and men's high end shoes  Functional Status:  Mobility: Bed Mobility Overal bed mobility: Needs Assistance Bed Mobility: Supine to Sit, Sit to Supine Rolling: Mod assist Sidelying to sit: Mod assist, HOB elevated, +2 for safety/equipment Supine to sit: Mod assist, +2 for physical assistance Sit to supine: Mod assist, +2 for physical assistance Sit to sidelying: Mod assist General bed mobility comments: Pt needed mod assist at trunk and legs to help progress to EOB and scoot to edge (feet did not touch due to short stature and high ICU bed). Transfers Overall transfer level: Needs assistance Equipment used: Crutches Transfers: Sit to/from Stand Sit to Stand: Mod assist, +2  safety/equipment, +2 physical assistance General transfer comment: Assist of 2 to stand from EOB with seat deflated (pt 4'9), not able to get fully upright. Weakness noted in BLEs. Ambulation/Gait General Gait Details: Unable due to weakness, stool.    ADL: ADL Overall ADL's : Needs assistance/impaired Eating/Feeding: Set up, Bed level Grooming: Set up, Bed level Upper Body Bathing: Minimal assistance, Moderate assistance, Sitting Lower Body Bathing: Maximal assistance, Sit to/from stand, +2 for physical assistance Upper Body Dressing : Minimal assistance, Moderate assistance, Sitting Lower Body Dressing: Moderate assistance, Maximal assistance, +2 for physical assistance, Sit to/from stand Lower Body Dressing Details (indicate cue type and reason): Pt donned socks, braces, and shoes with Mod A for sitting balance due to dizziness.  Toilet Transfer: Moderate assistance, +2 for physical assistance(sit<>Stand only at EOB) Toileting- Clothing Manipulation and Hygiene: Maximal assistance, Bed level Functional mobility during ADLs: Moderate assistance, +2 for physical assistance, Rolling walker(sit<>stand only at EOB) General ADL Comments: Pt presenting with dizziness,   Cognition: Cognition Overall Cognitive Status: Impaired/Different from baseline Arousal/Alertness: Awake/alert Orientation Level: Oriented X4 Attention: Sustained Sustained Attention: Impaired Sustained Attention Impairment: Verbal basic Memory: (scored intact on  subtest) Awareness: Appears intact Problem Solving: (suspect impaired to higher level) Executive Function: Sequencing, Organizing Safety/Judgment: Appears intact Cognition Arousal/Alertness: Awake/alert Behavior During Therapy: WFL for tasks assessed/performed Overall Cognitive Status: Impaired/Different from baseline General Comments: Generally oriented, not specifically tested.  Difficulty with complex vestibular exercises, but not sure if that is  cognition or visual  Physical Exam: Blood pressure (!) 179/87, pulse (!) 57, temperature 98.2 F (36.8 C), temperature source Oral, resp. rate 20, height 4\' 9"  (1.448 m), weight 65.3 kg, SpO2 100 %. Physical Exam  Nursing note and vitals reviewed. Constitutional: He appears well-developed and well-nourished.  Macrocephaly. Alert and appropriate. Occasional hiccups noted.   HENT:  Head: Normocephalic and atraumatic.  Eyes: Right eye exhibits no discharge. Left eye exhibits hordeolum. Left eye exhibits no discharge. Right eye exhibits abnormal extraocular motion and nystagmus. Left eye exhibits abnormal extraocular motion and nystagmus.  Neck: No tracheal deviation present. No thyromegaly present.  Respiratory: Effort normal. No respiratory distress.  GI: He exhibits no distension.  Musculoskeletal:     Comments: Left knee with multiple scars and deformity.  Bilateral feet well healed old incisions.   Mild lower extremity edema  Neurological: He is alert.  Dysarthria and ataxic speech and reports word finding deficits.   He was able to follow simple one step commands without difficulty and needed occasional cues with multi step commands.  Motor: Bilateral upper extremities: 4+/5 proximal distal No movement noted in bilateral lower extremities, however per report, paraplegia RLE>LLE with bilateral foot drop.   Skin:  Sacral dressing in place  Psychiatric: He has a normal mood and affect. His behavior is normal.    Results for orders placed or performed during the hospital encounter of 12/25/18 (from the past 48 hour(s))  Basic metabolic panel     Status: Abnormal   Collection Time: 12/27/18  3:45 AM  Result Value Ref Range   Sodium 137 135 - 145 mmol/L   Potassium 3.3 (L) 3.5 - 5.1 mmol/L   Chloride 105 98 - 111 mmol/L   CO2 22 22 - 32 mmol/L   Glucose, Bld 109 (H) 70 - 99 mg/dL   BUN 13 6 - 20 mg/dL   Creatinine, Ser 0.04 0.61 - 1.24 mg/dL   Calcium 8.9 8.9 - 59.9 mg/dL   GFR  calc non Af Amer >60 >60 mL/min   GFR calc Af Amer >60 >60 mL/min   Anion gap 10 5 - 15    Comment: Performed at [facility], [address], [place], [place] [address]  CBC     Status: Abnormal   Collection Time: 12/27/18  3:45 AM  Result Value Ref Range   WBC 10.6 (H) 4.0 - 10.5 K/uL   RBC 4.59 4.22 - 5.81 MIL/uL   Hemoglobin 14.3 13.0 - 17.0 g/dL   HCT 23.9 53.2 - 02.3 %   MCV 90.4 80.0 - 100.0 fL   MCH 31.2 26.0 - 34.0 pg   MCHC 34.5 30.0 - 36.0 g/dL   RDW 34.3 56.8 - 61.6 %   Platelets 223 150 - 400 K/uL   nRBC 0.0 0.0 - 0.2 %    Comment: Performed at [facility], [address], [place], [place] [address]  Hemoglobin A1c     Status: None   Collection Time: 12/27/18  3:45 AM  Result Value Ref Range   Hgb A1c MFr Bld 4.9 4.8 - 5.6 %    Comment: (NOTE) Pre diabetes:  5.7%-6.4% Diabetes:              >6.4% Glycemic control for   <7.0% adults with diabetes    Mean Plasma Glucose 93.93 mg/dL    Comment: Performed at [facility], [address] [place], [place] [address]  Lipid panel     Status: Abnormal   Collection Time: 12/27/18  3:45 AM  Result Value Ref Range   Cholesterol 100 0 - 200 mg/dL   Triglycerides 604100 <540<150 mg/dL   HDL 38 (L) >98>40 mg/dL   Total CHOL/HDL Ratio 2.6 RATIO   VLDL 20 0 - 40 mg/dL   LDL Cholesterol 42 0 - 99 mg/dL    Comment:        Total Cholesterol/HDL:CHD Risk Coronary Heart Disease Risk Table                     Men   Women  1/2 Average Risk   3.4   3.3  Average Risk       5.0   4.4  2 X Average Risk   9.6   7.1  3 X Average Risk  23.4   11.0        Use the calculated Patient Ratio above and the CHD Risk Table to determine the patient's CHD Risk.        ATP III CLASSIFICATION (LDL):  <100     mg/dL   Optimal  119-147100-129  mg/dL   Near or Above                    Optimal  130-159  mg/dL   Borderline  [id number]  mg/dL   High  >130>190     mg/dL   Very High Performed at [facility], [address]  [place], [place] [address]    Ct Head Wo Contrast  Result Date: 12/28/2018 CLINICAL DATA:  Initial evaluation for acute altered mental status, headache. EXAM: CT HEAD WITHOUT CONTRAST TECHNIQUE: Contiguous axial images were obtained from the base of the skull through the vertex without intravenous contrast. COMPARISON:  Prior CT from 12/25/2018. FINDINGS: Brain: Previously identified intraparenchymal hemorrhage centered at the left cerebellum is little interval changed in size in appearance, measuring 2.9 cm on today's exam. Associated localized vasogenic edema with mild regional mass effect is relatively similar as well. Partial effacement of the fourth ventricle and fourth ventricular outflow tract. Additional small subacute hemorrhages involving the anterior left frontal cortex (series 3, image 29 and periventricular right frontal white matter (series 3, image 24) are unchanged. No new intracranial hemorrhage. No acute large vessel territory infarct. Stigmata of Chiari 2 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 place with tip near the inter hemispheric fissure, stable. Marked ventriculomegaly is unchanged. No new finding. Vascular: No appreciable hyperdense vessel. Skull: Scalp soft tissues and calvarium demonstrate no acute finding. Sinuses/Orbits: Globes and orbital soft tissues within normal limits. Mild mucosal thickening noted within the ethmoidal air cells and maxillary sinuses. No mastoid effusion. Other: None. IMPRESSION: 1. No significant interval change in left cerebellar intraparenchymal hemorrhage with associated localized vasogenic edema. No other new acute intracranial abnormality. 2. Sequelae of Chiari 2 malformation with chronic ventriculomegaly and right occipital shunt catheter in place, stable. Electronically Signed   By: Rise MuBenjamin  McClintock M.D.   On: 12/28/2018 04:00    Medical Problem List and Plan: 1.  Deficits with mobility, transfers, self-care secondary to  left cerebellar hemorrhage with history  of strokes  Admit to CIR 2.  Antithrombotics: -DVT/anticoagulation:  Mechanical: Foot pump Bilateral lower extremities  -antiplatelet therapy: N/A due to bleed 3. Headaches/Pain Management: will add ultram prn.  4. Mood: LCSW to follow for evaluation and support.   -antipsychotic agents: N/A 5. Neuropsych: This patient is?  Fully capable of making decisions on his own behalf. 6. Skin/Wound Care: Routine pressure relief measures.  7. Fluids/Electrolytes/Nutrition: Monitor I/Os. Tolerating current diet.  8. Headaches: Persistent but appears to be improving.  Depakote added today.   9. Leucocytosis: Monitor for signs of infection.  CBC ordered for tomorrow a.m. 10. Hypokalemia: Continue daily supplement and adjust as needed.  CMP ordered for tomorrow a.m. 11. HTN: Monitor BP tid--continue atenolol   Monitor with increased mobility 12. Seizure disorder: On Keppra 1500 mg bid.  Monitor reoccurrence.  Post Admission Physician Evaluation: 1. Preadmission assessment reviewed and changes made below. 2. Functional deficits secondary  to left cerebellar hemorrhage with history of strokes. 3. Patient is admitted to receive collaborative, interdisciplinary care between the physiatrist, rehab nursing staff, and therapy team. 4. Patient's level of medical complexity and substantial therapy needs in context of that medical necessity cannot be provided at a lesser intensity of care such as a SNF. 5. Patient has experienced substantial functional loss from his/her baseline which was documented above under the "Functional History" and "Functional Status" headings.  Judging by the patient's diagnosis, physical exam, and functional history, the patient has potential for functional progress which will result in measurable gains while on inpatient rehab.  These gains will be of substantial and practical use upon discharge  in facilitating mobility and self-care at the  household level. 6. Physiatrist will provide 24 hour management of medical needs as well as oversight of the therapy plan/treatment and provide guidance as appropriate regarding the interaction of the two. 7. 24 hour rehab nursing will assist with bladder management, bowel management, safety, skin/wound care, disease management, pain management and patient education  and help integrate therapy concepts, techniques,education, etc. 8. PT will assess and treat for/with: Lower extremity strength, range of motion, stamina, balance, functional mobility, safety, adaptive techniques and equipment, wound care, coping skills, pain control, stroke education. Goals are: Mod I wheelchair level. 9. OT will assess and treat for/with: ADL's, functional mobility, safety, upper extremity strength, adaptive techniques and equipment, wound mgt, ego support, and community reintegration.   Goals are: Mod I wheelchair level. Therapy may proceed with showering this patient. 10. SLP will assess and treat for/with: Speech, high-level cognition.  Goals are: Mod I. 11. Case Management and Social Worker will assess and treat for psychological issues and discharge planning. 12. Team conference will be held weekly to assess progress toward goals and to determine barriers to discharge. 13. Patient will receive at least 3 hours of therapy per day at least 5 days per week. 14. ELOS: 13-18 days.       15. Prognosis:  good  I have personally performed a face to face diagnostic evaluation, including, but not limited to relevant history and physical exam findings, of this patient and developed relevant assessment and plan.  Additionally, I have reviewed and concur with the physician assistant's documentation above.  Maryla MorrowAnkit Gaylon Melchor, MD, ABPMR Jacquelynn CreePamela S Love, PA-C 12/28/2018

## 2018-12-28 NOTE — Progress Notes (Signed)
Patient arrived via bed, no complaints of pain at this time.

## 2018-12-28 NOTE — TOC Transition Note (Signed)
Transition of Care [facility]) - CM/SW Discharge Note   Patient Details  Name: Carlos Ferguson MRN: [id number] Date of Birth: 1966-01-18  [facility] CM/SW Contact:  Pollie Friar, RN Phone Number: 12/28/2018, 10:51 AM   Clinical Narrative:    Pt is discharging to CIR today. CM signing off.   Final next level of care: IP Rehab Facility Barriers to Discharge: No Barriers Identified   Patient Goals and CMS Choice        Discharge Placement                       Discharge Plan and Services                                     Social Determinants of Health (SDOH) Interventions     Readmission Risk Interventions No flowsheet data found.

## 2018-12-28 NOTE — PMR Pre-admission (Signed)
PMR Admission Coordinator Pre-Admission Assessment  Patient: Carlos Ferguson is an 53 y.o., male MRN: [id number] DOB: [birth year] Height: 4' 9" (144.8 cm) Weight: 65.3 kg  Insurance Information HMO:     PPO:      PCP:      IPA:      80/20:     OTHER:  PRIMARY: Medicare a and b      Policy#: [id number]      Subscriber: pt Benefits:  Phone #: passport one online Eff. Date: a 08/26/1999 and b 08/26/2006     Deduct: $1408      Out of Pocket Max: none      Life Max: none CIR: 100%      SNF: 20 full days Outpatient: 80%     Co-Pay: 20% Home Health: 100%      Co-Pay: none DME: 80%     Co-Pay: 20% Providers: pt choice  SECONDARY: none       Medicaid Application Date:       Case Manager:  Disability Application Date:      Case Worker:   The "Data Collection Information Summary" for patients in Inpatient Rehabilitation Facilities with attached "Privacy Act [place] Records" was provided and verbally reviewed with: Patient and Family  Emergency Contact Information Contact Information    Name Relation Home Work Mobile   Yerkes,PHILLIP    [phone]      Current Medical History  Patient Admitting Diagnosis: ICH; h/o spina bifida  History of Present Illness: 53 year old with history of spina bifida and hydrocephalus with shunt placement, and HTN. Presented on 12/25/2018 with a 4 day history of N/V. On arrival to the ED CT revealed a left cerebellar hemorrhage. Also note on CT was a ventricular shunt and interval enlargement of the ventricles relative to a prior CT head obtained in 2010. Shunt series revealed fracture of the shunt catheter consistent with shunt failure.  Neurology felt hemorrhage secondary to indeterminate etiology possibly related to hypertension. CTA head and neck no ELVO. Minor atherosclerosis aortic arch and L ICA bifurcation. 2 d echo ef 60 - 65 %. No source of embolus. LDL 42. Hgb A1c 4.9. No SCDs used due to red legs. Added Heparin tid as stable post hemorrhage.  On antithrombotic prior to admit.   Tylenol for headache. On atenolol pta for HTN. Treated with cleviprex and weaned off in ICU. Resumed atenolol and given prn labetalol. Goal SBP < 160.   Dr Ellene Route with Neurosurgery consulted due to nonfunctioning VP SHUNT. No plans to adjust. Observe clinically, but no plans for correction at this time. 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 Seizure d/o on Keppra. B back of thigh/groin redness with central clearing. Felt fungal so started on antifungal powder. WOC consulted.   Complete NIHSS TOTAL: 8  Patient's medical record from [facility]  has been reviewed by the rehabilitation admission coordinator and physician.  Past Medical History  Past Medical History:  Diagnosis Date  . Self-catheterizes urinary bladder   . Spina bifida [facility])     Family History   family history is not on file.  Prior Rehab/Hospitalizations Has the patient had prior rehab or hospitalizations prior to admission? Yes  Has the patient had major surgery during 100 days prior to admission? No   Current Medications  Current Facility-Administered Medications:  .  acetaminophen (TYLENOL) tablet 650 mg, 650 mg, Oral, Q4H PRN, 650 mg at 12/28/18 0623 **OR** acetaminophen (TYLENOL) solution 650  mg, 650 mg, Per Tube, Q4H PRN **OR** acetaminophen (TYLENOL) suppository 650 mg, 650 mg, Rectal, Q4H PRN, Kerney Elbe, MD .  atenolol (TENORMIN) tablet 50 mg, 50 mg, Oral, Daily, Kerney Elbe, MD, 50 mg at 12/27/18 1002 .  Chlorhexidine Gluconate Cloth 2 % PADS 6 each, 6 each, Topical, Daily, Kerney Elbe, MD, 6 each at 12/27/18 1003 .  heparin injection 5,000 Units, 5,000 Units, Subcutaneous, Q8H, Biby, Sharon L, NP, 5,000 Units at 12/28/18 [phone] .  labetalol (NORMODYNE) injection 20 mg, 20 mg, Intravenous, Q2H PRN, Garvin Fila, MD, 20 mg at 12/28/18 0837 .  levETIRAcetam (KEPPRA) tablet 1,500 mg, 1,500 mg, Oral, BID, Kerney Elbe, MD, 1,500 mg at  12/27/18 2231 .  nystatin (MYCOSTATIN/NYSTOP) topical powder, , Topical, BID, Biby, Sharon L, NP .  ondansetron (ZOFRAN) 4 MG/2ML injection, , , ,  .  pantoprazole (PROTONIX) EC tablet 40 mg, 40 mg, Oral, QHS, Garvin Fila, MD, 40 mg at 12/27/18 2231 .  potassium chloride SA (K-DUR) CR tablet 20 mEq, 20 mEq, Oral, Daily, Kerney Elbe, MD, 20 mEq at 12/27/18 1003 .  senna-docusate (Senokot-S) tablet 1 tablet, 1 tablet, Oral, BID, Kerney Elbe, MD  Patients Current Diet:  Diet Order            Diet Heart Room service appropriate? Yes with Assist; Fluid consistency: Thin  Diet effective now              Precautions / Restrictions Precautions Precautions: Fall Precaution Comments: nystagmus and dizziness, watch BP Restrictions Weight Bearing Restrictions: No   Has the patient had 2 or more falls or a fall with injury in the past year? No  Prior Activity Level Community (5-7x/wk): Mod I with crutches and BLE braces; drives  Prior Functional Level Self Care: Did the patient need help bathing, dressing, using the toilet or eating? Independent  Indoor Mobility: Did the patient need assistance with walking from room to room (with or without device)? Independent  Stairs: Did the patient need assistance with internal or external stairs (with or without device)? Independent  Functional Cognition: Did the patient need help planning regular tasks such as shopping or remembering to take medications? Independent  Home Assistive Devices / Equipment Home Assistive Devices/Equipment: Crutches, Eyeglasses Home Equipment: Crutches, Wheelchair - manual  Prior Device Use: Indicate devices/aids used by the patient prior to current illness, exacerbation or injury? bilateral LE braces and crutches  Current Functional Level Cognition  Arousal/Alertness: Awake/alert Overall Cognitive Status: Impaired/Different from baseline Orientation Level: Oriented X4 General Comments: Generally  oriented, not specifically tested.  Difficulty with complex vestibular exercises, but not sure if that is cognition or visual Attention: Sustained Sustained Attention: Impaired Sustained Attention Impairment: Verbal basic Memory: (scored intact on subtest) Awareness: Appears intact Problem Solving: (suspect impaired to higher level) Executive Function: Sequencing, Organizing Safety/Judgment: Appears intact    Extremity Assessment (includes Sensation/Coordination)  Upper Extremity Assessment: Generalized weakness  Lower Extremity Assessment: Defer to PT evaluation RLE Deficits / Details: Hx of spina bifida; limited knee/ankle AROM, limited hip extension. Limited assessment due to stool and dizziness. LLE Deficits / Details: Hx of spina bifida, limited ankle AROM, no knee flexion AROM, limited hip extension. Limited assessment due to stool and dizziness.    ADLs  Overall ADL's : Needs assistance/impaired Eating/Feeding: Set up, Bed level Grooming: Set up, Bed level Upper Body Bathing: Minimal assistance, Moderate assistance, Sitting Lower Body Bathing: Maximal assistance, Sit to/from stand, +2 for physical assistance Upper Body Dressing : Minimal  assistance, Moderate assistance, Sitting Lower Body Dressing: Moderate assistance, Maximal assistance, +2 for physical assistance, Sit to/from stand Lower Body Dressing Details (indicate cue type and reason): Pt donned socks, braces, and shoes with Mod A for sitting balance due to dizziness.  Toilet Transfer: Moderate assistance, +2 for physical assistance(sit<>Stand only at EOB) Toileting- Clothing Manipulation and Hygiene: Maximal assistance, Bed level Functional mobility during ADLs: Moderate assistance, +2 for physical assistance, Rolling walker(sit<>stand only at EOB) General ADL Comments: Pt presenting with dizziness,     Mobility  Overal bed mobility: Needs Assistance Bed Mobility: Supine to Sit, Sit to Supine Rolling: Mod assist  Sidelying to sit: Mod assist, HOB elevated, +2 for safety/equipment Supine to sit: Mod assist, +2 for physical assistance Sit to supine: Mod assist, +2 for physical assistance Sit to sidelying: Mod assist General bed mobility comments: Pt needed mod assist at trunk and legs to help progress to EOB and scoot to edge (feet did not touch due to short stature and high ICU bed).    Transfers  Overall transfer level: Needs assistance Equipment used: Crutches Transfers: Sit to/from Stand Sit to Stand: Mod assist, +2 safety/equipment, +2 physical assistance General transfer comment: Assist of 2 to stand from EOB with seat deflated (pt 4'9), not able to get fully upright. Weakness noted in BLEs.    Ambulation / Gait / Stairs / Wheelchair Mobility  Ambulation/Gait General Gait Details: Unable due to weakness, stool.    Posture / Balance Dynamic Sitting Balance Sitting balance - Comments: pt had periods of close supervision, when scooting required min assist for balance as he had increased sway when moving his body or his head.  Balance Overall balance assessment: Needs assistance Sitting-balance support: Feet supported, Bilateral upper extremity supported, Single extremity supported Sitting balance-Leahy Scale: Fair Sitting balance - Comments: pt had periods of close supervision, when scooting required min assist for balance as he had increased sway when moving his body or his head.  Postural control: Posterior lean Standing balance support: During functional activity, Bilateral upper extremity supported Standing balance-Leahy Scale: Poor Standing balance comment: Requires assist of 2 for standing.    Special needs/care consideration BiPAP/CPAP n/a CPM  N/a Continuous Drip IV  N/a Dialysis n/a Life Vest  N/a Oxygen  N/a Special Bed  N/a Trach Size  N/a Wound Vac n/a Skin   Meryle Ready, RN  Registered Nurse  WOC  Consult Note  Signed  Date of Service:  12/26/2018 12:07 PM           Signed         Show:Clear all [x]Manual[x]Template[]Copied  Added by: [x]Doty, Georges Lynch, RN  []Hover for details [facility] Nurse wound consult note Patient receiving care in [place] Reason for Consult: rash on legs Wound type: Large patch of red fungal rash on posterior right thigh/hip and RLE with satellite lesions MD had already ordered Nystatin powder 2 times daily.  Please continue this therapy. Monitor the wound area(s) for worsening of condition such as: Signs/symptoms of infection,  Increase in size,  Development of or worsening of odor, Development of pain, or increased pain at the affected locations.  Notify the medical team if any of these develop.  Thank you for the consult.  Discussed plan of care with the patient and bedside nurse.  [place] nurse will not follow at this time.  Please re-consult the [place] team if needed.  Val Riles, RN, MSN, CWOCN, CNS-BC, pager [phone]  Bowel mgmt:  patient states he wears depends at home. Takes imodium prn to keep from having leakage and then suppository when he becomes constipate. He would like assistance in developing a bowel program to obtain ability to be continent Bladder mgmt:  Patient self caths q 4 hrs at home pta Diabetic mgmt: n/a Behavioral consideration n/a Chemo/radiation  N/a Patient's Dad, Doren Custard is his visitor designee   Previous Home Environment  Living Arrangements: Alone  Lives With: Alone Available Help at Discharge: Family(Dad, 50 years old lives next door, can provide supervision l) Type of Home: [place]: One level Home Access: Stairs to enter Entrance Stairs-Rails: Right Entrance Stairs-Number of Steps: (also has ramp through kitchen entrance) Bathroom Shower/Tub: Tub/shower unit, Architectural technologist: Standard Bathroom Accessibility: Yes How Accessible: Accessible via walker [place]: No  Discharge Living Setting Plans for Discharge Living Setting: Patient's  home, Alone Type of Home at Discharge: House Discharge Home Layout: One level Discharge Home Access: Stairs to enter, Ramped entrance Entrance Stairs-Rails: Right Entrance Stairs-Number of Steps: 4 steps but also ramp thought kitchen entrance Discharge Bathroom Shower/Tub: Tub/shower unit, Curtain Discharge Bathroom Toilet: Standard Discharge Bathroom Accessibility: Yes How Accessible: Accessible via walker Does the patient have any problems obtaining your medications?: No  Social/Family/Support Systems Contact Information: Mable [place] Anticipated Caregiver: Dad Anticipated Caregiver's Contact Information: [phone] Ability/Limitations of Caregiver: 53 year old. does not use AD but walks slow Caregiver Availability: 24/7 Discharge Plan Discussed with Primary Caregiver: Yes Is Caregiver In Agreement with Plan?: Yes Does Caregiver/Family have Issues with Lodging/Transportation while Pt is in Rehab?: No  Goals/Additional Needs Patient/Family Goal for Rehab: Mod I to superivison with PT, OT, and SLP Expected length of stay: ELOS 10 to 14 days Special Service Needs: Patient completes self caths q 4 hra; requests assistance with making a bowel regimen;  Additional Information: VP shunt non functional Pt/Family Agrees to Admission and willing to participate: Yes Program Orientation Provided & Reviewed with Pt/Caregiver Including Roles  & Responsibilities: Yes  Decrease burden of Care through IP rehab admission: n/a  Possible need for SNF placement upon discharge:  Not anticipated  Patient Condition: I have reviewed medical records from [facility] , spoken with CM, and patient and family member, Dad. I met with patient at the bedside for inpatient rehabilitation assessment.  Patient will benefit from ongoing PT, OT and SLP, can actively participate in 3 hours of therapy a day 5 days of the week, and can make measurable gains during the admission.  Patient will also benefit from  the coordinated team approach during an Inpatient Acute Rehabilitation admission.  The patient will receive intensive therapy as well as Rehabilitation physician, nursing, social worker, and care management interventions.  Due to bladder management, bowel management, safety, skin/wound care, disease management, medication administration, pain management and patient education the patient requires 24 hour a day rehabilitation nursing.  The patient is currently mod to max assist with mobility and basic ADLs.  Discharge setting and therapy post discharge at home with home health is anticipated.  Patient has agreed to participate in the Acute Inpatient Rehabilitation Program and will admit today.  Preadmission Screen Completed By:  Cleatrice Burke, 12/28/2018 10:25 AM ______________________________________________________________________   Discussed status with Dr. Posey Pronto  on  12/28/2018 at  1045 and received approval for admission today.  Admission Coordinator:  Cleatrice Burke, RN MSN, time 1045 Date  12/28/2018   Assessment/Plan: Diagnosis: ICH; h/o spina bifida  1. Does the  need for close, 24 hr/day Medical supervision in concert with the patient's rehab needs make it unreasonable for this patient to be served in a less intensive setting? Yes  2. Co-Morbidities requiring supervision/potential complications: spina bifida and hydrocephalus with shunt placement, and HTN, hypokalemia.  3. Due to safety, disease management and patient education, does the patient require 24 hr/day rehab nursing? Yes 4. Does the patient require coordinated care of a physician, rehab nurse, PT (1-2 hrs/day, 5 days/week), OT (1-2 hrs/day, 5 days/week) and SLP (1-2 hrs/day, 5 days/week) to address physical and functional deficits in the context of the above medical diagnosis(es)? Yes Addressing deficits in the following areas: balance, endurance, locomotion, strength, transferring, bathing, dressing, toileting,  cognition and psychosocial support 5. Can the patient actively participate in an intensive therapy program of at least 3 hrs of therapy 5 days a week? Yes 6. The potential for patient to make measurable gains while on inpatient rehab is excellent 7. Anticipated functional outcomes upon discharge from inpatients are: min assist PT, min assist OT, supervision SLP 8. Estimated rehab length of stay to reach the above functional goals is: 12-16 days. 9. Anticipated D/C setting: Home 10. Anticipated post D/C treatments: HH therapy and Home excercise program 11. Overall Rehab/Functional Prognosis: good  MD Signature: Delice Lesch, MD, ABPMR

## 2018-12-28 NOTE — Progress Notes (Signed)
Inpatient Rehabilitation Admissions Coordinator  I met with patient with his Dad at bedside. Pt is in agreement to CIR admit today. I spoke with Dr. Leonie Man and Burnetta Sabin, [facility]. BP Med adjustments to be made today. I have alerted RN CM, Kelli. I will make the arrangements to admit today.  Danne Baxter, RN, MSN Rehab Admissions Coordinator [phone] 12/28/2018 10:18 AM

## 2018-12-29 ENCOUNTER — Inpatient Hospital Stay (HOSPITAL_COMMUNITY): Payer: Medicare Other | Admitting: Physical Therapy

## 2018-12-29 ENCOUNTER — Inpatient Hospital Stay (HOSPITAL_COMMUNITY): Payer: Medicare Other | Admitting: Rehabilitation

## 2018-12-29 ENCOUNTER — Inpatient Hospital Stay (HOSPITAL_COMMUNITY): Payer: Medicare Other | Admitting: Speech Pathology

## 2018-12-29 ENCOUNTER — Inpatient Hospital Stay (HOSPITAL_COMMUNITY): Payer: Medicare Other | Admitting: Occupational Therapy

## 2018-12-29 DIAGNOSIS — R42 Dizziness and giddiness: Secondary | ICD-10-CM

## 2018-12-29 DIAGNOSIS — K592 Neurogenic bowel, not elsewhere classified: Secondary | ICD-10-CM | POA: Diagnosis present

## 2018-12-29 DIAGNOSIS — I614 Nontraumatic intracerebral hemorrhage in cerebellum: Secondary | ICD-10-CM

## 2018-12-29 DIAGNOSIS — D72829 Elevated white blood cell count, unspecified: Secondary | ICD-10-CM

## 2018-12-29 DIAGNOSIS — Q052 Lumbar spina bifida with hydrocephalus: Secondary | ICD-10-CM

## 2018-12-29 DIAGNOSIS — N312 Flaccid neuropathic bladder, not elsewhere classified: Secondary | ICD-10-CM

## 2018-12-29 DIAGNOSIS — I69198 Other sequelae of nontraumatic intracerebral hemorrhage: Secondary | ICD-10-CM

## 2018-12-29 LAB — CBC WITH DIFFERENTIAL/PLATELET
Abs Immature Granulocytes: 0.05 10*3/uL (ref 0.00–0.07)
Basophils Absolute: 0 10*3/uL (ref 0.0–0.1)
Basophils Relative: 0 %
Eosinophils Absolute: 0 10*3/uL (ref 0.0–0.5)
Eosinophils Relative: 0 %
HCT: 48.4 % (ref 39.0–52.0)
Hemoglobin: 16.8 g/dL (ref 13.0–17.0)
Immature Granulocytes: 0 %
Lymphocytes Relative: 7 %
Lymphs Abs: 0.8 10*3/uL (ref 0.7–4.0)
MCH: 31.7 pg (ref 26.0–34.0)
MCHC: 34.7 g/dL (ref 30.0–36.0)
MCV: 91.3 fL (ref 80.0–100.0)
Monocytes Absolute: 1.1 10*3/uL — ABNORMAL HIGH (ref 0.1–1.0)
Monocytes Relative: 9 %
Neutro Abs: 10.1 10*3/uL — ABNORMAL HIGH (ref 1.7–7.7)
Neutrophils Relative %: 84 %
Platelets: 307 10*3/uL (ref 150–400)
RBC: 5.3 MIL/uL (ref 4.22–5.81)
RDW: 13.6 % (ref 11.5–15.5)
WBC: 12 10*3/uL — ABNORMAL HIGH (ref 4.0–10.5)
nRBC: 0 % (ref 0.0–0.2)

## 2018-12-29 LAB — COMPREHENSIVE METABOLIC PANEL
ALT: 69 U/L — ABNORMAL HIGH (ref 0–44)
AST: 24 U/L (ref 15–41)
Albumin: 3.7 g/dL (ref 3.5–5.0)
Alkaline Phosphatase: 79 U/L (ref 38–126)
Anion gap: 17 — ABNORMAL HIGH (ref 5–15)
BUN: 12 mg/dL (ref 6–20)
CO2: 22 mmol/L (ref 22–32)
Calcium: 9.3 mg/dL (ref 8.9–10.3)
Chloride: 96 mmol/L — ABNORMAL LOW (ref 98–111)
Creatinine, Ser: 0.67 mg/dL (ref 0.61–1.24)
GFR calc Af Amer: >60 mL/min (ref >60)
GFR calc non Af Amer: >60 mL/min (ref >60)
Glucose, Bld: 125 mg/dL — ABNORMAL HIGH (ref 70–99)
Potassium: 4.5 mmol/L (ref 3.5–5.1)
Sodium: 135 mmol/L (ref 135–145)
Total Bilirubin: 0.9 mg/dL (ref 0.3–1.2)
Total Protein: 7.2 g/dL (ref 6.5–8.1)

## 2018-12-29 LAB — URINALYSIS, ROUTINE W REFLEX MICROSCOPIC
Bilirubin Urine: NEGATIVE
Glucose, UA: NEGATIVE mg/dL
Hgb urine dipstick: NEGATIVE
Ketones, ur: 5 mg/dL — AB
Nitrite: NEGATIVE
Protein, ur: 30 mg/dL — AB
Specific Gravity, Urine: 1.024 (ref 1.005–1.030)
WBC, UA: >50 WBC/hpf — ABNORMAL HIGH (ref 0–5)
pH: 5 (ref 5.0–8.0)

## 2018-12-29 MED ORDER — BISACODYL 10 MG RE SUPP
10.0000 mg | RECTAL | Status: DC | PRN
  Administered 2019-01-12 – 2019-01-20 (×2): 10 mg via RECTAL
  Filled 2018-12-29: qty 1

## 2018-12-29 MED ORDER — MODAFINIL 100 MG PO TABS
100.0000 mg | ORAL_TABLET | Freq: Every day | ORAL | Status: DC
  Administered 2018-12-29 – 2019-02-02 (×36): 100 mg via ORAL
  Filled 2018-12-29 (×36): qty 1

## 2018-12-29 MED ORDER — LIDOCAINE HCL (PF) 1 % IJ SOLN
INTRAMUSCULAR | Status: AC
  Filled 2018-12-29: qty 30

## 2018-12-29 NOTE — Evaluation (Signed)
Speech Language Pathology Assessment and Plan  Patient Details  Name: Carlos Ferguson MRN: [id number] Date of Birth: [birth year]  SLP Diagnosis: Cognitive Impairments;Dysphagia;Dysarthria  Rehab Potential: Good ELOS: 2.5-3 weeks    Today's Date: 12/29/2018  Session 1: SLP Individual Time: 4098-1191 SLP Individual Time Calculation (min): 25 min   Session 2: SLP Individual Time: 1445-1500 SLP Individual Time Calculation (min): 15 min   Problem List:  Patient Active Problem List   Diagnosis Date Noted  . Essential hypertension 12/28/2018  . Obesity 12/28/2018  . Spina bifida ([place]) 12/28/2018  . Urinary retention 12/28/2018  . Seizures ([place]) 12/28/2018  . Hypokalemia 12/28/2018  . Intracranial hemorrhage, cerebellar ([place]) 12/28/2018  . Cerebellar bleed ([place])   . Leukocytosis   . Cytotoxic brain edema ([place]) 12/27/2018  . ICH (intracerebral hemorrhage) (H[facility]) L cerebellar, etiology ? HTN 12/25/2018   Past Medical History:  Past Medical History:  Diagnosis Date  . Cellulitis of left knee 2014  . Flaccid neurogenic bladder    self caths every 4 hours  . Frequent UTI   . Hydrocephalus ([place]) 2014  . ICH (intracerebral hemorrhage) ([place]) 2014   left frontal ICH  . Seizures ([place])   . Self-catheterizes urinary bladder   . Sepsis ([place]) 2014   due to knee infection   . Spina bifida ([place])   . 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    Past Surgical History:  Past Surgical History:  Procedure Laterality Date  . CSF SHUNT  1969  . LLE surgery     Multiple surgeries for correction.   Marland Kitchen RLE surgery     surgery for correction     Assessment / Plan / Recommendation Clinical Impression Patient is a 53 year old male with history of spina bifida,  Chiari malformation s/p shunt, multiple BLE surgeries with right hip dysplasia/degeneration (has plans to be evaluated next week at [facility] for [facility]) who was admitted on 12/25/2018 with 4 day history of N/V and headaches. CT  head sone revealing 3 cm left cerebellar hematoma with severe hydrocephalus slightly increased from 2010 and shunt series showed fracture of shunt catheter.  MRI brain showed left cerebellar hemorrhage without underlying neoplasm or vascular malformations and numerous chronic micro bleeds likely due to amyloid angiopathy or hypertensive disease--chronic ventriculomegaly without significant worsening. Blood pressure elevated at admission --cleveprex started and bleed felt to be hypertensive in nature.  Dr. Ellene Route consulted due to concerns of shunt fracture and felt that hydrocephalus was not of concern. CTA head/neck was negative for aneurysm and predominant fetal type origin of PCAs with diminutive VBS.  Echocardiogram with ejection fraction of 60-65%. Impaired left ventricular relaxation.  Therapy initiated with recommendations for CIR. Patient admitted 12/28/18.   Patient demonstrates moderate cognitive impairments characterized by impaired sustained attention, functional problem solving and short-term recall of daily information which impacts his safety with functional and familiar tasks. Patient also demonstrates moderate dysarthria due to generalized oral weakness and requires Mod A verbal cues for use of speech intelligibility strategies to achieve ~75% intelligibility.  Patient also demonstrates a moderate oral dysphagia characterized by prolonged mastication and delayed AP transit resulting in patient having to orally expectorate solid textures and utilize liquid washes to help clear oral residue. Overt s/s of aspiration were also noted with thin liquids but were reduced with small single sips of thin liquids via cup. Recommend patient downgrade to Dys. 2 textures with thin liquids via cup only. Patient verbalized understanding and agreement. Patient would  benefit from skilled SLP intervention to maximize his cognitive, speech and swallowing function prior to discharge.    Skilled Therapeutic Interventions           Session 1: Upon arrival, patient was lethargic while upright in the wheelchair. Patient reported he was exhausted and awaiting to be transferred back to bed. RN/NT aware and patient transferred back to bed with total +2 assist via slideboard with Max-Total A verbal cues needed for sequencing. Once patient in bed, patient extremely lethargic and required Max verbal cues to maintain arousal, therefore, session ended early. Patient left supine in bed with alarm on and all needs within reach.   Session 2: Administered a BSE, please see above for details.    SLP Assessment  Patient will need skilled Speech Lanaguage Pathology Services during CIR admission    Recommendations  SLP Diet Recommendations: Dysphagia 2 (Fine chop);Thin Liquid Administration via: No straw;Cup Medication Administration: Whole meds with liquid(1 at a time) Supervision: Patient able to self feed;Full supervision/cueing for compensatory strategies Compensations: Small sips/bites;Clear throat intermittently;Minimize environmental distractions;Slow rate Postural Changes and/or Swallow Maneuvers: Seated upright 90 degrees Oral Care Recommendations: Oral care BID Recommendations for Other Services: Neuropsych consult Patient destination: Home Follow up Recommendations: Home Health SLP;24 hour supervision/assistance Equipment Recommended: None recommended by SLP    SLP Frequency 3 to 5 out of 7 days   SLP Duration  SLP Intensity  SLP Treatment/Interventions 2.5-3 weeks  Minumum of 1-2 x/day, 30 to 90 minutes  Cognitive remediation/compensation;Internal/external aids;Speech/Language facilitation;Cueing hierarchy;Environmental controls;Dysphagia/aspiration precaution training;Patient/family education;Functional tasks;Therapeutic Activities    Pain No/Denies Pain   Prior Functioning Type of Home: House  Lives With: Alone Available Help at Discharge: Family(Father can provide supervision assist only) Education:  HS (GED) Vocation: Unemployed  Short Term Goals: Week 1: SLP Short Term Goal 1 (Week 1): Patient will consume current diet with minimal overt s/s of aspiration and Mod A verbal cues for use of swallowing compensatory strategies. SLP Short Term Goal 2 (Week 1): Patient will demonstrate sustained attention to functional tasks for ~15 minutes with Min A verbal cues for redirection. SLP Short Term Goal 3 (Week 1): Patient will recall new, daily information with Min A verbal and visual cues. SLP Short Term Goal 4 (Week 1): Patient will demonstrate functional problem solving for basic and familiar tasks with Min A verbal cues. SLP Short Term Goal 5 (Week 1): Patient will utilize speech intelligibility strategies at the phrase level with Min A verbal cues to achieve ~90% intelligibility.  Refer to Care Plan for Long Term Goals  Recommendations for other services: Neuropsych  Discharge Criteria: Patient will be discharged from SLP if patient refuses treatment 3 consecutive times without medical reason, if treatment goals not met, if there is a change in medical status, if patient makes no progress towards goals or if patient is discharged from hospital.  The above assessment, treatment plan, treatment alternatives and goals were discussed and mutually agreed upon: by patient  Ashar Lewinski 12/29/2018, 3:58 PM

## 2018-12-29 NOTE — Evaluation (Signed)
Physical Therapy Assessment and Plan  Patient Details  Name: Carlos Ferguson MRN: [id number] Date of Birth: 12-Apr-1966  PT Diagnosis: Abnormal posture, Cognitive deficits, Difficulty walking, Impaired cognition, Impaired sensation, Muscle weakness and Vertigo of central origin Rehab Potential: Fair ELOS: 18-21 days   Today's Date: 12/29/2018 PT Individual Time: 0915-1015 PT Individual Time Calculation (min): 60 min    Problem List:  Patient Active Problem List   Diagnosis Date Noted  . Essential hypertension 12/28/2018  . Obesity 12/28/2018  . Spina bifida ([place]) 12/28/2018  . Urinary retention 12/28/2018  . Seizures ([employer]) 12/28/2018  . Hypokalemia 12/28/2018  . Intracranial hemorrhage, cerebellar ([place]) 12/28/2018  . Cerebellar bleed ([place])   . Leukocytosis   . Cytotoxic brain edema ([place]) 12/27/2018  . ICH (intracerebral hemorrhage) ([facility]) L cerebellar, etiology ? HTN 12/25/2018    Past Medical History:  Past Medical History:  Diagnosis Date  . Cellulitis of left knee 2014  . Flaccid neurogenic bladder    self caths every 4 hours  . Frequent UTI   . Hydrocephalus ([place]) 2014  . ICH (intracerebral hemorrhage) ([place]) 2014   left frontal ICH  . Seizures ([place])   . Self-catheterizes urinary bladder   . Sepsis ([place]) 2014   due to knee infection   . Spina bifida ([place])   . 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    Past Surgical History:  Past Surgical History:  Procedure Laterality Date  . CSF SHUNT  1969  . LLE surgery     Multiple surgeries for correction.   Marland Kitchen RLE surgery     surgery for correction     Assessment & Plan Clinical Impression:  Carlos Ferguson is a 53 year old male with history of spina bifida,  Chiari malformation s/p shunt, multiple BLE surgeries with right hip dysplasia/degeneration (has plans to be evaluated next week at [facility] for [facility]) who was admitted on 12/25/2018 with 4 day history of N/V and headaches. CT head sone revealing 3 cm left  cerebellar hematoma with severe hydrocephalus slightly increased from 2010 and shunt series showed fracture of shunt catheter.  MRI brain showed left cerebellar hemorrhage without underlying neoplasm or vascular malformations and numerous chronic micro bleeds likely due to amyloid angiopathy or hypertensive disease--chronic ventriculomegaly without significant worsening. Blood pressure elevated at admission --cleveprex started and bleed felt to be hypertensive in nature.  Dr. Ellene Route consulted due to concerns of shunt fracture and felt that hydrocephalus was not of concern.   CTA head/neck was negative for aneurysm and predominant fetal type origin of PCAs with diminutive VBS.  Echocardiogram with ejection fraction of 60-65%.  Impaired left ventricular relaxation. WOC consulted or large fungal rash on right posterior thigh and recommended nystatin powder for local care. Blood pressures have improved and now back on atenolol. Hypokalemia treated with extra dose of K dur and l leukocytosis improving. He did have worsening of HA with vomiting episode last night and CT head repeated early am showing no significant change in left cerebellar ICH with associated vasogenic edema.  Please see preadmission assessment from earlier today as well. Patient transferred to CIR on 12/28/2018 .   Patient currently requires max to total A with mobility secondary to muscle weakness and muscle joint tightness, abnormal tone and unbalanced muscle activation, decreased awareness and delayed processing, central origin and decreased sitting balance, decreased postural control and decreased balance strategies.  Prior to hospitalization, patient was modified independent  with mobility and lived with Alone in  a House home.  Home access is 4Stairs to enter.  Patient will benefit from skilled PT intervention to maximize safe functional mobility, minimize fall risk and decrease caregiver burden for planned discharge home with intermittent  assist.  Anticipate patient will benefit from follow up [facility] at discharge.  PT - End of Session Activity Tolerance: Tolerates 30+ min activity with multiple rests Endurance Deficit: Yes Endurance Deficit Description: Pt only tolerates sitting EOB x 5 min PT Assessment Rehab Potential (ACUTE/IP ONLY): Fair PT Barriers to Discharge: Decreased caregiver support;Medical stability;Home environment access/layout PT Patient demonstrates impairments in the following area(s): Balance;Endurance;Motor;Pain;Safety;Sensory PT Transfers Functional Problem(s): Bed Mobility;Bed to Chair;Car;Furniture;Floor PT Locomotion Functional Problem(s): Ambulation;Wheelchair Mobility;Stairs PT Plan PT Intensity: Minimum of 1-2 x/day ,45 to 90 minutes PT Frequency: 5 out of 7 days PT Duration Estimated Length of Stay: 18-21 days PT Treatment/Interventions: Ambulation/gait training;Balance/vestibular training;Cognitive remediation/compensation;Community reintegration;Discharge planning;Disease management/prevention;DME/adaptive equipment instruction;Functional electrical stimulation;Functional mobility training;Neuromuscular re-education;Pain management;Patient/family education;Psychosocial support;Splinting/orthotics;Therapeutic Activities;Therapeutic Exercise;UE/LE Strength taining/ROM;UE/LE Coordination activities;Wheelchair propulsion/positioning PT Transfers Anticipated Outcome(s): min A PT Locomotion Anticipated Outcome(s): min A overall at w/c level PT Recommendation Follow Up Recommendations: Home health PT Patient destination: Home Equipment Recommended: None recommended by PT;Sliding board Equipment Details: pt already owns crutches and manual w/c  Skilled Therapeutic Intervention Evaluation completed (see details above and below) with education on PT POC and goals and individual treatment initiated with focus on orientation to rehab unit, schedule, POC, goals etc. Pt received supine in bed, agreeable to PT  eval. Pt has no complaints of pain at rest but has onset of a headache whenever he sits up. Supine to sit with max A with HOB elevated and use of bedrails.Pt tolerates sitting about 5 min with mod to max A to maintain sitting balance. Pt is dependent to don BLE braces and shoes while long-sitting in bed. Pt unable to maintain sitting balance without UE support. Set pt up with 16x16 w/c and slide board for next therapist to attempt to transfer him out of bed. Pt left supine in bed with needs in reach at end of session.  PT Evaluation Precautions/Restrictions Precautions Precautions: Fall Restrictions Weight Bearing Restrictions: No Home Living/Prior Functioning Home Living Available Help at Discharge: Family(16 yo dad can assist intermittently) Type of Home: House Home Access: Stairs to enter [employer] of Steps: 4 Entrance Stairs-Rails: Right Home Layout: One level Bathroom Shower/Tub: Product/process development scientist: Standard Bathroom Accessibility: Yes  Lives With: Alone Prior Function Level of Independence: Independent with gait;Independent with transfers;Requires assistive device for independence  Able to Take Stairs?: Yes Driving: Yes Vocation: Unemployed Comments: Uses crutches and braces for ambulation (donns on floor). Drives. Does not cook or clean. 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 likes to collect corning ware and men's high end shoes Vision/Perception  Perception Perception: Within Functional Limits Praxis Praxis: Intact  Cognition Overall Cognitive Status: Impaired/Different from baseline Arousal/Alertness: Lethargic Orientation Level: Oriented to person;Oriented to place;Oriented to time;Disoriented to situation Attention: Sustained Sustained Attention: Impaired Sustained Attention Impairment: Verbal basic;Functional basic Memory: Impaired Memory Impairment: Decreased short term memory;Decreased recall of new  information Decreased Short Term Memory: Functional basic Immediate Memory Recall: Sock;Blue;Bed Memory Recall Sock: Without Cue Memory Recall Blue: With Cue Memory Recall Bed: Not able to recall Awareness: Appears intact Problem Solving: Impaired Problem Solving Impairment: Functional basic Executive Function: Sequencing;Organizing Safety/Judgment: Appears intact Sensation Sensation Light Touch: Impaired Detail Light Touch Impaired Details: Impaired LLE;Impaired RLE Proprioception: Impaired Detail Proprioception  Impaired Details: Impaired RLE;Impaired LLE Coordination Gross Motor Movements are Fluid and Coordinated: No Fine Motor Movements are Fluid and Coordinated: Yes Coordination and Movement Description: Impaired 2/2 spina bifida, limited movement tolerance on eval Motor  Motor Motor: Other (comment)(impaired 2/2 spina bifida) Motor - Skilled Clinical Observations: impaired 2/2 spina bifida  Mobility Bed Mobility Bed Mobility: Rolling Right;Rolling Left;Supine to Sit;Sit to Supine Rolling Right: Moderate Assistance - Patient 50-74% Rolling Left: Moderate Assistance - Patient 50-74% Supine to Sit: Maximal Assistance - Patient - Patient 25-49% Sit to Supine: Maximal Assistance - Patient 25-49% Transfers Transfers: Lateral/Scoot Transfers Lateral/Scoot Transfers: 2 Press photographer (Assistive device): Other (Comment)(slide board) Artist / Additional Locomotion Stairs: No Wheelchair Mobility Wheelchair Mobility: No  Trunk/Postural Assessment  Cervical Assessment Cervical Assessment: Exceptions to WFL(forward head) Thoracic Assessment Thoracic Assessment: Exceptions to WFL(kyphotic; rounded shoulders) Lumbar Assessment Lumbar Assessment: Exceptions to WFL(posterior pelvic tilt) Postural Control Postural Control: Deficits on evaluation Righting Reactions: Insufficient  Balance Balance Balance Assessed: Yes Static Sitting Balance Static Sitting -  Balance Support: No upper extremity supported;Feet unsupported Static Sitting - Level of Assistance: 3: Mod assist Static Sitting - Comment/# of Minutes: Sitting EOB Dynamic Sitting Balance Dynamic Sitting - Balance Support: Feet unsupported;During functional activity;No upper extremity supported Dynamic Sitting - Level of Assistance: 3: Mod assist;2: Max assist Sitting balance - Comments: Sitting EOB Extremity Assessment  RUE Assessment RUE Assessment: Within Functional Limits LUE Assessment LUE Assessment: Within Functional Limits RLE Assessment RLE Assessment: Exceptions to [facility] Passive Range of Motion (PROM) Comments: tight hips, HS, ankle DF Active Range of Motion (AROM) Comments: impaired 2/2 weakness and spina bifida General Strength Comments: impaired, at least 1/5(pt unable to accurately give PLOF) LLE Assessment LLE Assessment: Exceptions to [facility] Passive Range of Motion (PROM) Comments: tight hips, HS, ankle DF General Strength Comments: impaired 2/2 weakness and spina bifida    Refer to Care Plan for Long Term Goals  Recommendations for other services: None   Discharge Criteria: Patient will be discharged from PT if patient refuses treatment 3 consecutive times without medical reason, if treatment goals not met, if there is a change in medical status, if patient makes no progress towards goals or if patient is discharged from hospital.  The above assessment, treatment plan, treatment alternatives and goals were discussed and mutually agreed upon: by patient   Excell Seltzer, PT, DPT 12/29/2018, 4:27 PM

## 2018-12-29 NOTE — Progress Notes (Signed)
[place] PHYSICAL MEDICINE & REHABILITATION PROGRESS NOTE   Subjective/Complaints:  Pt reports he's been having dizziness with therapy- doesn't appear to be related to orthostatic hypotension- pt's BP 168/76 for PT when sitting- also feels VERY sleepy ever since admission for cerebellar hematoma- no matter what he does/how much he sleeps, he's tired.  He thinks the dizziness related to his bleed/hematoma- I tend to agree- however Meclizine is very sedating- is also helps to lay down when it occurs.   Objective:   Ct Head Wo Contrast  Result Date: 12/28/2018 CLINICAL DATA:  Initial evaluation for acute altered mental status, headache. EXAM: CT HEAD WITHOUT CONTRAST TECHNIQUE: Contiguous axial images were obtained from the base of the skull through the vertex without intravenous contrast. COMPARISON:  Prior CT from 12/25/2018. FINDINGS: Brain: Previously identified intraparenchymal hemorrhage centered at the left cerebellum is little interval changed in size in appearance, measuring 2.9 cm on today's exam. Associated localized vasogenic edema with mild regional mass effect is relatively similar as well. Partial effacement of the fourth ventricle and fourth ventricular outflow tract. Additional small subacute hemorrhages involving the anterior left frontal cortex (series 3, image 29 and periventricular right frontal white matter (series 3, image 24) are unchanged. No new intracranial hemorrhage. No acute large vessel territory infarct. Stigmata of Chiari 2 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 place with tip near the inter hemispheric fissure, stable. Marked ventriculomegaly is unchanged. No new finding. Vascular: No appreciable hyperdense vessel. Skull: Scalp soft tissues and calvarium demonstrate no acute finding. Sinuses/Orbits: Globes and orbital soft tissues within normal limits. Mild mucosal thickening noted within the ethmoidal air cells and maxillary sinuses. No  mastoid effusion. Other: None. IMPRESSION: 1. No significant interval change in left cerebellar intraparenchymal hemorrhage with associated localized vasogenic edema. No other new acute intracranial abnormality. 2. Sequelae of Chiari 2 malformation with chronic ventriculomegaly and right occipital shunt catheter in place, stable. Electronically Signed   By: Jeannine Boga M.D.   On: 12/28/2018 04:00   Recent Labs    12/27/18 0345 12/29/18 0529  WBC 10.6* 12.0*  HGB 14.3 16.8  HCT 41.5 48.4  PLT 223 307   Recent Labs    12/27/18 0345 12/29/18 0529  NA 137 135  K 3.3* 4.5  CL 105 96*  CO2 22 22  GLUCOSE 109* 125*  BUN 13 12  CREATININE 0.72 0.67  CALCIUM 8.9 9.3    Intake/Output Summary (Last 24 hours) at 12/29/2018 1948 Last data filed at 12/29/2018 1841 Gross per 24 hour  Intake 240 ml  Output 1025 ml  Net -785 ml     Physical Exam: Vital Signs Blood pressure (!) 149/73, pulse (!) 50, temperature 98.1 F (36.7 C), temperature source Oral, resp. rate 16, SpO2 96 %.   Physical Exam  Nursing note and vitals reviewed. Constitutional: He appears well-developed and well-nourished.  Macrocephaly. Alert and appropriate. Sitting up in bed, just laid down due to dizziness/vertigo- PT in room, NAD HENT:  Head: Normocephalic and atraumatic.  Eyes:  Right eye exhibits abnormal extraocular motion and nystagmus. Left eye exhibits abnormal extraocular motion and nystagmus.  Neck: No tracheal deviation present. No thyromegaly present.  Respiratory: Effort normal. No respiratory distress.  GI: He exhibits no distension.  Musculoskeletal:     Comments: Left knee with multiple scars and deformity.  Bilateral feet well healed old incisions.   Mild lower extremity edema  Neurological: He is alert.  Dysarthria and ataxic speech and reports word finding  deficits.   He was able to follow simple one step commands without difficulty and needed occasional cues with multi step commands.   Motor: Bilateral upper extremities: 4+/5 proximal distal No movement noted in bilateral lower extremities, however per report, paraplegia RLE>LLE with bilateral foot drop.   Skin:  Sacral dressing in place  Psychiatric: He has a normal mood and affect. His behavior is normal.  However he's very tired; almost fell asleep in the middle of a sentence x2- needed multiple verbal cues to stay awake.    Assessment/Plan: 1. 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  Physiatrist is providing close team supervision and 24 hour management of active medical problems listed below.  Physiatrist and rehab team continue to assess barriers to discharge/monitor patient progress toward functional and medical goals  Care Tool:  Bathing    Body parts bathed by patient: Right arm, Left arm, Chest, Abdomen, Right upper leg, Left upper leg, Right lower leg, Left lower leg, Face   Body parts bathed by helper: Front perineal area, Buttocks     Bathing assist Assist Level: Moderate Assistance - Patient 50 - 74%     Upper Body Dressing/Undressing Upper body dressing   What is the patient wearing?: Pull over shirt    Upper body assist Assist Level: Minimal Assistance - Patient > 75%    Lower Body Dressing/Undressing Lower body dressing      What is the patient wearing?: Incontinence brief, Pants     Lower body assist Assist for lower body dressing: Total Assistance - Patient < 25%     Toileting Toileting    Toileting assist Assist for toileting: Total Assistance - Patient < 25%     Transfers Chair/bed transfer  Transfers assist  Chair/bed transfer activity did not occur: Safety/medical concerns  Chair/bed transfer assist level: 2 Helpers      Locomotion Ambulation   Ambulation assist   Ambulation activity did not occur: Safety/medical concerns          Walk 10 feet activity   Assist  Walk 10 feet activity did not occur: Safety/medical concerns        Walk 50 feet activity   Assist Walk 50 feet with 2 turns activity did not occur: Safety/medical concerns         Walk 150 feet activity   Assist Walk 150 feet activity did not occur: Safety/medical concerns         Walk 10 feet on uneven surface  activity   Assist Walk 10 feet on uneven surfaces activity did not occur: Safety/medical concerns         Wheelchair     Assist Will patient use wheelchair at discharge?: Yes Type of Wheelchair: Manual Wheelchair activity did not occur: Safety/medical concerns         Wheelchair 50 feet with 2 turns activity    Assist    Wheelchair 50 feet with 2 turns activity did not occur: Safety/medical concerns       Wheelchair 150 feet activity     Assist  Wheelchair 150 feet activity did not occur: Safety/medical concerns       Blood pressure (!) 149/73, pulse (!) 50, temperature 98.1 F (36.7 C), temperature source Oral, resp. rate 16, SpO2 96 %.   Medical Problem List and  Plan: 1.  Deficits with mobility, transfers, self-care secondary to left cerebellar hemorrhage with history of strokes             Admit to CIR 2.  Antithrombotics: -DVT/anticoagulation:  Mechanical: Foot pump Bilateral lower extremities             -antiplatelet therapy: N/A due to bleed 3. Headaches/Pain Management: will add ultram prn.  4. Mood: LCSW to follow for evaluation and support.              -antipsychotic agents: N/A 5. Neuropsych: This patient is?  Fully capable of making decisions on his own behalf. 6. Skin/Wound Care: Routine pressure relief measures.  7. Fluids/Electrolytes/Nutrition: Monitor I/Os. Tolerating current diet.  8. Headaches: Persistent but appears to be improving.  Depakote  added today.   9. Leucocytosis: Monitor for signs of infection.  CBC ordered for tomorrow a.m.  9/3- U/A shows likely colonization based on U/A- WBC is slightly up at 12k- will recheck in AM, since pt complains of no Sx's of infection- afebrile 10. Hypokalemia: Continue daily supplement and adjust as needed.  CMP ordered for tomorrow a.m.  9/3- K+ 4.5- con't supplements and monitor 11. HTN: Monitor BP tid--continue atenolol              Monitor with increased mobility 12. Seizure disorder: On Keppra 1500 mg bid.  Monitor reoccurrence. 15. 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  16. Attention/fatigue- will try Provigil since doesn't raise BP as much 100 mg daily and see how he does 17. Vertigo/dizziness- don't want to add Meclizine to an already sedated gentleman- asked PT to give him rest breaks and will monitor for now.  18. R hip dysplasia- [facility]- will have to be put off.  19. Dispo- likely 14-17 days- will check with team     LOS: 1 days A FACE TO FACE EVALUATION WAS PERFORMED  Octavie Westerhold 12/29/2018, 7:48 PM

## 2018-12-29 NOTE — Progress Notes (Signed)
pts belongings placed in the closet in the room. Bag had wallet and home medications inside with some clothes. Pt declined offer to lock personal belongings up with security. Pt has 2 pair of black glasses, 1 cell phone, and 1 charger in his possesion.

## 2018-12-29 NOTE — Evaluation (Signed)
Occupational Therapy Assessment and Plan  Patient Details  Name: Carlos Ferguson MRN: [id number] Date of Birth: [birth year]  OT Diagnosis: acute pain, cognitive deficits and muscle weakness (generalized) Rehab Potential: Rehab Potential (ACUTE ONLY): Fair ELOS: 2.5-3 weeks   Today's Date: 12/29/2018 OT Individual Time: 1017-5102 OT Individual Time Calculation (min): 75 min     Problem List:  Patient Active Problem List   Diagnosis Date Noted  . Essential hypertension 12/28/2018  . Obesity 12/28/2018  . Spina bifida ([place]) 12/28/2018  . Urinary retention 12/28/2018  . Seizures ([place]) 12/28/2018  . Hypokalemia 12/28/2018  . Intracranial hemorrhage, cerebellar ([place]) 12/28/2018  . Cerebellar bleed ([place])   . Leukocytosis   . Cytotoxic brain edema ([place]) 12/27/2018  . ICH (intracerebral hemorrhage) ([facility]) L cerebellar, etiology ? HTN 12/25/2018    Past Medical History:  Past Medical History:  Diagnosis Date  . Cellulitis of left knee 2014  . Flaccid neurogenic bladder    self caths every 4 hours  . Frequent UTI   . Hydrocephalus ([place]) 2014  . ICH (intracerebral hemorrhage) ([place]) 2014   left frontal ICH  . Seizures ([place])   . Self-catheterizes urinary bladder   . Sepsis ([place]) 2014   due to knee infection   . Spina bifida ([place])   . 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    Past Surgical History:  Past Surgical History:  Procedure Laterality Date  . CSF SHUNT  1969  . LLE surgery     Multiple surgeries for correction.   Marland Kitchen RLE surgery     surgery for correction     Assessment & Plan Clinical Impression: Carlos Ferguson is a 53 year old male with history of spina bifida,  Chiari malformation s/p shunt, multiple BLE surgeries with right hip dysplasia/degeneration (has plans to be evaluated next week at [facility] for surgery) who was admitted on 12/25/2018 with 4 day history of N/V and headaches. CT head sone revealing 3 cm left cerebellar hematoma with severe  hydrocephalus slightly increased from 2010 and shunt series showed fracture of shunt catheter.  MRI brain showed left cerebellar hemorrhage without underlying neoplasm or vascular malformations and numerous chronic micro bleeds likely due to amyloid angiopathy or hypertensive disease--chronic ventriculomegaly without significant worsening. Blood pressure elevated at admission --cleveprex started and bleed felt to be hypertensive in nature.  Dr. Ellene Route consulted due to concerns of shunt fracture and felt that hydrocephalus was not of concern.   CTA head/neck was negative for aneurysm and predominant fetal type origin of PCAs with diminutive VBS.  Echocardiogram with ejection fraction of 60-65%.  Impaired left ventricular relaxation. WOC consulted or large fungal rash on right posterior thigh and recommended nystatin powder for local care. Blood pressures have improved and now back on atenolol. Hypokalemia treated with extra dose of K dur and l leukocytosis improving. He did have worsening of HA with vomiting episode last night and CT head repeated early am showing no significant change in left cerebellar ICH with associated vasogenic edema. Patient transferred to CIR on 12/28/2018 .    Patient currently requires max with basic self-care skills secondary to muscle weakness, decreased cardiorespiratoy endurance, central origin and decreased sitting balance, decreased standing balance, decreased postural control and decreased balance strategies.  Prior to hospitalization, patient could complete ADLs/IADLs with modified independent .  Patient will benefit from skilled intervention to decrease level of assist with basic self-care skills and increase independence with basic self-care skills prior to discharge home with  care partner.  Anticipate patient will require minimal physical assistance and follow up home health.  OT - End of Session Activity Tolerance: Tolerates 10 - 20 min activity with multiple  rests Endurance Deficit: Yes Endurance Deficit Description: Pt unable to tolerate sitting EOB >10 minutes, unable to tolerate full bed level bathing/dressing routine OT Assessment Rehab Potential (ACUTE ONLY): Fair OT Barriers to Discharge: Decreased caregiver support OT Barriers to Discharge Comments: Elderly father is only able to provide supervision assist OT Patient demonstrates impairments in the following area(s): Balance;Safety;Endurance;Pain;Motor;Cognition;Sensory OT Basic ADL's Functional Problem(s): Grooming;Bathing;Dressing;Toileting OT Transfers Functional Problem(s): Toilet;Tub/Shower OT Additional Impairment(s): None OT Plan OT Intensity: Minimum of 1-2 x/day, 45 to 90 minutes OT Frequency: 5 out of 7 days OT Duration/Estimated Length of Stay: 2.5-3 weeks OT Treatment/Interventions: Balance/vestibular training;Discharge planning;Pain management;Self Care/advanced ADL retraining;Therapeutic Activities;UE/LE Coordination activities;Therapeutic Exercise;Functional electrical stimulation;Cognitive remediation/compensation;Functional mobility training;Patient/family education;Skin care/wound managment;UE/LE Strength taining/ROM;Splinting/orthotics;Psychosocial support;Neuromuscular re-education;DME/adaptive equipment instruction;Wheelchair propulsion/positioning OT Self Feeding Anticipated Outcome(s): Indep OT Basic Self-Care Anticipated Outcome(s): Supervision from bed level OT Toileting Anticipated Outcome(s): Self-caths OT Bathroom Transfers Anticipated Outcome(s): TBD- does not use toilet as self-caths and modified bowel movements with incontinence. Not recommending shower transfer at this time OT Recommendation Patient destination: Home Follow Up Recommendations: Home health OT Equipment Recommended: To be determined   Skilled Therapeutic Intervention Pt seen for OT eval and ADL bathing/dressing session. Pt awake in supine upon arrival with NT present assisting with set-up  of breakfast, hand off to OT. Pt denied any pain at rest, reports being weak/fatigue but willing to participate as able.  He transferred to sitting EOB with max A using hospital bed functions with multi-modal cuing for sequencing/technique. He ate breakfast seated EOB, mod A initially for sitting balance fading to min A (feet unsupported due to body habitus). Pt tolerating ~10 minutes seated EOB before complaints of dizziness and needing to lye down. BP in supine 168/76. Pt declined getting up at this time, agreeable to do bathing/dressing from bed level. Used hospital bed functions to get into long sitting position with min A. Completed UB bathing/dressing with occasional steadying assist from long sitting position. Pt becoming dizzy again with "pressure" headache, requiring another reclined rest break. Following ~5 minutes, returned to long sitting to bathe LEs, however, due to fatigue, required total A for LB dressing, mod A to roll in order to have pants pulled up.  Pt left in supine at end of session, chair bed alarm on and all needs in reach.  Pt fatiguing quickly throughout session, would fall asleep during any rest break provided, however was able to be easily re-awoken.   OT Evaluation Precautions/Restrictions  Precautions Precautions: Fall Restrictions Weight Bearing Restrictions: No General Chart Reviewed: Yes Additional Pertinent History: spina bifida Home Living/Prior Functioning Home Living Available Help at Discharge: Family(Father can provide supervision assist only) Type of Home: House Home Access: Stairs to enter [employer] of Steps: 4 Entrance Stairs-Rails: Right Home Layout: One level Bathroom Shower/Tub: Tub/shower unit, Industrial/product designer: Yes  Lives With: Alone IADL History Homemaking Responsibilities: No Current License: Yes Mode of Transportation: Car Education: HS (GED) Prior Function Level of Independence:  Independent with gait, Independent with transfers, Requires assistive device for independence, Independent with basic ADLs, Needs assistance with homemaking  Able to Take Stairs?: Yes Driving: Yes Vocation: Unemployed Comments: Uses crutches and braces for ambulation (donns on floor). Drives. Does not cook or clean. 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 likes to collect corning ware and men's high end shoes Vision Baseline Vision/History: Wears glasses Wears Glasses: Reading only Vision Assessment?: No apparent visual deficits Perception  Perception: Within Functional Limits Praxis Praxis: Intact Cognition Overall Cognitive Status: Impaired/Different from baseline Arousal/Alertness: Awake/alert Orientation Level: Person;Place Year: 2020 Month: August Day of Week: Incorrect Memory: Impaired Memory Impairment: Decreased short term memory;Decreased recall of new information Decreased Short Term Memory: Functional basic Immediate Memory Recall: Sock;Blue;Bed Memory Recall Sock: Without Cue Memory Recall Blue: With Cue Memory Recall Bed: Not able to recall Attention: Sustained Sustained Attention: Impaired Sustained Attention Impairment: Verbal basic;Functional basic Awareness: Appears intact Problem Solving: Impaired Problem Solving Impairment: Functional basic Executive Function: Sequencing;Organizing Safety/Judgment: Appears intact Sensation Sensation Light Touch: Impaired Detail Light Touch Impaired Details: Impaired LLE;Impaired RLE Coordination Gross Motor Movements are Fluid and Coordinated: No Fine Motor Movements are Fluid and Coordinated: Yes Coordination and Movement Description: Impaired 2/2 spina bifida, limited movement tolerance on eval Motor  Motor Motor: Other (comment)(impaired due to spina bifida) Trunk/Postural Assessment  Cervical Assessment Cervical Assessment: Exceptions to WFL(Forward head) Thoracic Assessment Thoracic  Assessment: Exceptions to WFL(Kyphotic; Rounded shoulders) Lumbar Assessment Lumbar Assessment: Exceptions to WFL(Posterior pelvic tilt) Postural Control Postural Control: Deficits on evaluation Righting Reactions: Insufficient  Balance Balance Balance Assessed: Yes Static Sitting Balance Static Sitting - Balance Support: Feet unsupported;No upper extremity supported Static Sitting - Level of Assistance: 4: Min assist;3: Mod assist Static Sitting - Comment/# of Minutes: Sitting EOB Dynamic Sitting Balance Dynamic Sitting - Balance Support: Feet unsupported;During functional activity;No upper extremity supported Dynamic Sitting - Level of Assistance: 3: Mod assist;2: Max assist Sitting balance - Comments: Sitting EOB Extremity/Trunk Assessment RUE Assessment RUE Assessment: Within Functional Limits LUE Assessment LUE Assessment: Within Functional Limits     Refer to Care Plan for Long Term Goals  Recommendations for other services: Neuropsych   Discharge Criteria: Patient will be discharged from OT if patient refuses treatment 3 consecutive times without medical reason, if treatment goals not met, if there is a change in medical status, if patient makes no progress towards goals or if patient is discharged from hospital.  The above assessment, treatment plan, treatment alternatives and goals were discussed and mutually agreed upon: by patient  Bonny Vanleeuwen L 12/29/2018, 4:02 PM

## 2018-12-29 NOTE — Plan of Care (Deleted)
  Problem: RH Balance Goal: LTG: Patient will maintain dynamic sitting balance (OT) Description: LTG:  Patient will maintain dynamic sitting balance with assistance during activities of daily living (OT) Flowsheets (Taken 12/29/2018 1608) LTG: Pt will maintain dynamic sitting balance during ADLs with: Supervision/Verbal cueing

## 2018-12-29 NOTE — Progress Notes (Signed)
Social Work Assessment and Plan   Patient Details  Name: Carlos BickersGregory Ferguson MRN: [id number] Date of Birth: Feb 21, 1966  Today's Date: 12/29/2018  Problem List:  Patient Active Problem List   Diagnosis Date Noted  . 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 Neurogenic bowel   . Neurogenic bladder, flaccid   . Essential hypertension 12/28/2018  . Obesity 12/28/2018  . Spina bifida ([facility]) 12/28/2018  . Urinary retention 12/28/2018  . Seizures ([facility]) 12/28/2018  . Hypokalemia 12/28/2018  . Intracranial hemorrhage, cerebellar ([facility]) 12/28/2018  . Cerebellar bleed ([facility])   . Leukocytosis   . Cytotoxic brain edema ([facility]) 12/27/2018  . ICH (intracerebral hemorrhage) ([facility]) L cerebellar, etiology ? HTN 12/25/2018   Past Medical History:  Past Medical History:  Diagnosis Date  . Cellulitis of left knee 2014  . Flaccid neurogenic bladder    self caths every 4 hours  . Frequent UTI   . Hydrocephalus ([facility]) 2014  . ICH (intracerebral hemorrhage) ([facility]) 2014   left frontal ICH  . Seizures ([facility])   . Self-catheterizes urinary bladder   . Sepsis ([facility]) 2014   due to knee infection   . Spina bifida ([facility])   . 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    Past Surgical History:  Past Surgical History:  Procedure Laterality Date  . CSF SHUNT  1969  . LLE surgery     Multiple surgeries for correction.   Marland Kitchen. RLE surgery     surgery for correction    Social History:  reports that he has never smoked. He has never used smokeless tobacco. He reports previous alcohol use. He reports that he does not use drugs.  Family / Support Systems Marital Status: Single Patient Roles: Other (Comment)(son, brother) Other Supports: father, Carlos Ferguson ([age] yrs old) @ [phone];  sister, Carlos Ferguson Anticipated Caregiver: Dad Ability/Limitations of Caregiver: 53 year old. does not use AD but walks slow;  Father reports that pt's sister is not able to  offer much support as she is providing care to her mother. Caregiver Availability: 24/7(supervision only) Family Dynamics: Patient reports that his father is very supportive, however, father clarifies that he is unable to provide any physical assistance.  Father describes patient as always having been very independent and wanted to live on his own.  Social History Preferred language: English Religion:  Cultural Background: N/A Read: Yes Write: Yes Employment Status: Disabled Marine scientistLegal History/Current Legal Issues: None Guardian/Conservator: None-per MD, patient is "?  Fully capable "to make decisions on his own behalf.   Abuse/Neglect Abuse/Neglect Assessment Can Be Completed: Yes Physical Abuse: Denies Verbal Abuse: Denies Sexual Abuse: Denies Exploitation of patient/patient's resources: Denies Self-Neglect: Denies  Emotional Status Pt's affect, behavior and adjustment status: Patient lying in bed and agreeable to completing assessment interview which she does without much difficulty.  Patient is very focused on wanting to regain his independence and return to his own home which is next door of his father.  Unclear at time of assessment of how much patient can understand and appreciate his current deficits due to ICH.  Patient denies any emotional distress currently, however, will monitor and refer for neuropsychology as indicated. Recent Psychosocial Issues: Per patient and father, living independently in a home next to father PTA. Psychiatric History: None Substance Abuse History: None  Patient / Family Perceptions, Expectations & Goals Pt/Family understanding of illness & functional limitations: As noted, difficult to tell if patient fully appreciates  his current deficits and probable assistance needs at discharge.  Father with very limited understanding of long-term care needs as well.  Await all evaluations to be completed, however, patient will likely require more since the brother  can provide. Premorbid pt/family roles/activities: Patient living independently and very active at home and community PTA. Anticipated changes in roles/activities/participation: Anticipating that patient will require physical assistance upon discharge which father is unable to do. Pt/family expectations/goals: Patient reports that his primary goal is to regain his independence and return to living alone at his home.  Community Resources [employer]s: None Premorbid Home Care/DME Agencies: None Transportation available at discharge: Yes  Discharge Planning Living Arrangements: Alone Support Systems: Parent Type of Residence: Private residence Insurance Resources: Chartered certified accountant Resources: SSD Financial Screen Referred: No Living Expenses: Own Money Management: Patient Does the patient have any problems obtaining your medications?: No Patient/Family Preliminary Plans: As noted, patient's goal is to be able to return to his own home.  Per admission coordinator screening, plan appears to be for patient to go home with father who can provide only supervision. Sw Barriers to Discharge: Decreased caregiver support Sw Barriers to Discharge Comments: Per father, he can only provide supervision. Social Work Anticipated Follow Up Needs: HH/OP, SNF  Clinical Impression Unfortunate gentleman here on CIR following an ICH and with noted history of spina bifida.  Patient was living independently in a home next door to his father who is elderly and cannot provide any physical assistance for patient at discharge.  Anticipate caregiver constraints may affect discharge if patient unable to reach a supervision level.  Patient denies any significant emotional distress, however, will monitor through her stay here.  Will follow for support and discharge planning needs.  Carlos Ferguson 12/29/2018, 4:30 PM

## 2018-12-29 NOTE — Progress Notes (Signed)
Inpatient Rehabilitation  Patient information reviewed and entered into eRehab system by Johari Pinney M. Brodan Grewell, M.A., CCC/SLP, PPS Coordinator.  Information including medical coding, functional ability and quality indicators will be reviewed and updated through discharge.    

## 2018-12-29 NOTE — Progress Notes (Signed)
Physical Therapy Session Note  Patient Details  Name: Carlos Ferguson MRN: [id number] Date of Birth: 1966/04/20  Today's Date: 12/29/2018 PT Individual Time: 1130-1200 PT Individual Time Calculation (min): 30 min   Short Term Goals: Week 1:     Skilled Therapeutic Interventions/Progress Updates:   Pt received lying in bed, on side, trying to use call bell.  PT asked what pt needed.  Pt reports needing something to eat so that he can "do better."  RN notified and brought graham crackers and juice to room.  Max A for sidelying to sitting and also to scoot hips to EOB, despite cues for use of UEs.  Pt needing assist for eating/drinking and also min A for sitting balance due to tendency to loose balance to the R.  He did do better with sitting balance once cued to use bed rail for support.  PT retrieved 4" step and placed under BLEs for improved support.  Pt willing to try and use slideboard for transfer into w/c.  Note that L KAFO unlocks, however pt only able to get outside lock open prior to transfer.  Pt did very well with R lateral lean to elbow and was able to maintain there at S level while PT assisted with board placement.  Min A to return to upright position.  Pt provided cues for hand placement and facilitation for forward weight shift, but did very well scooting across board at mod A.  Once in chair, PT able to get other lock on KAFO unlocked.  Encouraged pt to remain in w/c to eat lunch for safety, then could return to bed and remove braces.  Seat belt alarm in place and all needs in reach.  RN and tech notified of transfer method during session.   Therapy Documentation Precautions:  Precautions Precautions: Fall Restrictions Weight Bearing Restrictions: No    Pain: Pt reports no pain during session.     Therapy/Group: Individual Therapy  Denice Bors 12/29/2018, 12:54 PM

## 2018-12-30 ENCOUNTER — Inpatient Hospital Stay (HOSPITAL_COMMUNITY): Payer: Medicare Other | Admitting: Speech Pathology

## 2018-12-30 ENCOUNTER — Inpatient Hospital Stay (HOSPITAL_COMMUNITY): Payer: Medicare Other | Admitting: Physical Therapy

## 2018-12-30 ENCOUNTER — Inpatient Hospital Stay (HOSPITAL_COMMUNITY): Payer: Medicare Other | Admitting: Occupational Therapy

## 2018-12-30 LAB — CBC WITH DIFFERENTIAL/PLATELET
Abs Immature Granulocytes: 0.05 10*3/uL (ref 0.00–0.07)
Basophils Absolute: 0 10*3/uL (ref 0.0–0.1)
Basophils Relative: 0 %
Eosinophils Absolute: 0 10*3/uL (ref 0.0–0.5)
Eosinophils Relative: 0 %
HCT: 48.1 % (ref 39.0–52.0)
Hemoglobin: 16.7 g/dL (ref 13.0–17.0)
Immature Granulocytes: 0 %
Lymphocytes Relative: 12 %
Lymphs Abs: 1.4 10*3/uL (ref 0.7–4.0)
MCH: 31.5 pg (ref 26.0–34.0)
MCHC: 34.7 g/dL (ref 30.0–36.0)
MCV: 90.6 fL (ref 80.0–100.0)
Monocytes Absolute: 1.3 10*3/uL — ABNORMAL HIGH (ref 0.1–1.0)
Monocytes Relative: 11 %
Neutro Abs: 8.5 10*3/uL — ABNORMAL HIGH (ref 1.7–7.7)
Neutrophils Relative %: 77 %
Platelets: 298 10*3/uL (ref 150–400)
RBC: 5.31 MIL/uL (ref 4.22–5.81)
RDW: 13.8 % (ref 11.5–15.5)
WBC: 11.2 10*3/uL — ABNORMAL HIGH (ref 4.0–10.5)
nRBC: 0 % (ref 0.0–0.2)

## 2018-12-30 LAB — BASIC METABOLIC PANEL
Anion gap: 12 (ref 5–15)
BUN: 12 mg/dL (ref 6–20)
CO2: 26 mmol/L (ref 22–32)
Calcium: 9.3 mg/dL (ref 8.9–10.3)
Chloride: 98 mmol/L (ref 98–111)
Creatinine, Ser: 0.68 mg/dL (ref 0.61–1.24)
GFR calc Af Amer: >60 mL/min (ref >60)
GFR calc non Af Amer: >60 mL/min (ref >60)
Glucose, Bld: 108 mg/dL — ABNORMAL HIGH (ref 70–99)
Potassium: 4.3 mmol/L (ref 3.5–5.1)
Sodium: 136 mmol/L (ref 135–145)

## 2018-12-30 LAB — URINE CULTURE: Culture: >=100000 — AB

## 2018-12-30 NOTE — Progress Notes (Signed)
[facility] PHYSICAL MEDICINE & REHABILITATION PROGRESS NOTE   Subjective/Complaints:  Pt reports he's "fine" today- doesn't want to be woken up- was cleaned out/had large BM last night- also thinks he had another one overnight.- Wearing depends. .   Objective:   No results found. Recent Labs    12/29/18 0529 12/30/18 0517  WBC 12.0* 11.2*  HGB 16.8 16.7  HCT 48.4 48.1  PLT 307 298   Recent Labs    12/29/18 0529 12/30/18 0517  NA 135 136  K 4.5 4.3  CL 96* 98  CO2 22 26  GLUCOSE 125* 108*  BUN 12 12  CREATININE 0.67 0.68  CALCIUM 9.3 9.3    Intake/Output Summary (Last 24 hours) at 12/30/2018 1907 Last data filed at 12/30/2018 1554 Gross per 24 hour  Intake 200 ml  Output 1100 ml  Net -900 ml     Physical Exam: Vital Signs Blood pressure (!) 155/86, pulse (!) 53, temperature 98.6 F (37 C), temperature source Oral, resp. rate 18, SpO2 100 %.   Physical Exam  Nursing note and vitals reviewed. Constitutional: He appears well-developed and well-nourished.  Macrocephaly. Alert and appropriate. Lying in bed in dark, refuses to let light on; NAD, wearing depends HENT:  Head: Normocephalic and atraumatic.  Eyes:  Right eye exhibits abnormal extraocular motion and nystagmus. Left eye exhibits abnormal extraocular motion and nystagmus grossly Neck: No tracheal deviation present. No thyromegaly present. CV- RRR  Respiratory: CTA b/l.  GI: soft, NT, ND, protuberant, (+) hypoactive BS Musculoskeletal:     Comments: Left knee with multiple scars and deformity.  Bilateral feet well healed old incisions.   Mild lower extremity edema  Neurological: He is alert.  Dysarthria and ataxic speech and reports word finding deficits.   He was able to follow simple one step commands without difficulty and needed occasional cues with multi step commands.  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   Skin:  Sacral dressing in place  Psychiatric: sleeping- woke briefly    Assessment/Plan: 1. 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  Physiatrist is providing close team supervision and 24 hour management of active medical problems listed below.  Physiatrist and rehab team continue to assess barriers to discharge/monitor patient progress toward functional and medical goals  Care Tool:  Bathing    Body parts bathed by patient: Right arm, Left arm, Chest, Abdomen, Right upper leg, Left upper leg, Right lower leg, Left lower leg, Face   Body parts bathed by helper: Front perineal area, Buttocks     Bathing assist Assist Level: Moderate Assistance - Patient 50 - 74%     Upper Body Dressing/Undressing Upper body dressing   What is the patient wearing?: Pull over shirt    Upper body assist Assist Level: Minimal Assistance - Patient > 75%    Lower Body Dressing/Undressing Lower body dressing      What is the patient wearing?: Incontinence brief, Pants     Lower body assist Assist for lower body dressing: Total Assistance - Patient < 25%     Toileting Toileting    Toileting assist Assist for toileting: Total Assistance - Patient < 25%     Transfers Chair/bed  transfer  Transfers assist  Chair/bed transfer activity did not occur: Safety/medical concerns  Chair/bed transfer assist level: 2 Helpers     Locomotion Ambulation   Ambulation assist   Ambulation activity did not occur: Safety/medical concerns          Walk 10 feet activity   Assist  Walk 10 feet activity did not occur: Safety/medical concerns        Walk 50 feet activity   Assist Walk 50 feet with 2 turns  activity did not occur: Safety/medical concerns         Walk 150 feet activity   Assist Walk 150 feet activity did not occur: Safety/medical concerns         Walk 10 feet on uneven surface  activity   Assist Walk 10 feet on uneven surfaces activity did not occur: Safety/medical concerns         Wheelchair     Assist Will patient use wheelchair at discharge?: Yes Type of Wheelchair: Manual Wheelchair activity did not occur: Safety/medical concerns         Wheelchair 50 feet with 2 turns activity    Assist    Wheelchair 50 feet with 2 turns activity did not occur: Safety/medical concerns       Wheelchair 150 feet activity     Assist  Wheelchair 150 feet activity did not occur: Safety/medical concerns       Blood pressure (!) 155/86, pulse (!) 53, temperature 98.6 F (37 C), temperature source Oral, resp. rate 18, SpO2 100 %.   Medical Problem List and Plan: 1.  Deficits with mobility, transfers, self-care secondary to left cerebellar hemorrhage with history of strokes             Admit to CIR 2.  Antithrombotics: -DVT/anticoagulation:  Mechanical: Foot pump Bilateral lower extremities             -antiplatelet therapy: N/A due to bleed 3. Headaches/Pain Management: will add ultram prn.  4. Mood: LCSW to follow for evaluation and support.              -antipsychotic agents: N/A 5. Neuropsych: This patient is  Fully capable of making decisions on his own behalf. 6. Skin/Wound Care: Routine pressure relief measures.  7. Fluids/Electrolytes/Nutrition: Monitor I/Os. Tolerating current diet.  8. Headaches: Persistent but appears to be improving.  Depakote added today.   9. Leucocytosis: Monitor for signs of infection.  CBC ordered for tomorrow a.m.  9/3- U/A shows likely colonization based on U/A- WBC is slightly up at 12k- will recheck in AM, since pt complains of no Sx's of infection- afebrile  9/4- WBC down to 11.4k- will recheck next  week. 10. Hypokalemia: Continue daily supplement and adjust as needed.  CMP ordered for tomorrow a.m.  9/3- K+ 4.5- con't supplements and monitor 11. HTN: Monitor BP tid--continue atenolol              Monitor with increased mobility 12. Seizure disorder: On Keppra 1500 mg bid.  Monitor reoccurrence. 15. Neurogenic bowel- pt goes 1-2x/week- requires manual disimpaction- will write for bowel program to be done 2x/week to disimpact bowels.'   9/4- had BM last night  16. Attention/fatigue- will try Provigil since doesn't raise BP as much 100 mg daily and see how he does 17. Vertigo/dizziness- don't want to add Meclizine to an already sedated gentleman- asked PT to give him rest breaks and will monitor for now.  18. R hip dysplasia- was due to go  to [facility] for surgery- will have to be put off.  19. Dispo- likely 14-17 days- will check with team      LOS: 2 days A FACE TO FACE EVALUATION WAS PERFORMED  Carlos Ferguson 12/30/2018, 7:07 PM

## 2018-12-30 NOTE — Progress Notes (Signed)
Speech Language Pathology Daily Session Note  Patient Details  Name: Carlos Ferguson MRN: [id number] Date of Birth: 1966/03/08  Today's Date: 12/30/2018 SLP Individual Time: 1101-1155 SLP Individual Time Calculation (min): 54 min  Short Term Goals: Week 1: SLP Short Term Goal 1 (Week 1): Patient will consume current diet with minimal overt s/s of aspiration and Mod A verbal cues for use of swallowing compensatory strategies. SLP Short Term Goal 2 (Week 1): Patient will demonstrate sustained attention to functional tasks for ~15 minutes with Min A verbal cues for redirection. SLP Short Term Goal 3 (Week 1): Patient will recall new, daily information with Min A verbal and visual cues. SLP Short Term Goal 4 (Week 1): Patient will demonstrate functional problem solving for basic and familiar tasks with Min A verbal cues. SLP Short Term Goal 5 (Week 1): Patient will utilize speech intelligibility strategies at the phrase level with Min A verbal cues to achieve ~90% intelligibility.  Skilled Therapeutic Interventions: Pt was seen for skilled ST focused on dysphagia and cognition. Pt was easily aroused from sleep and engaged in pleasant simple conversation with therapist during session once fully alert. Pt was oriented X4. SLP facilitated session with skilled observation of pt consuming dysphagia 2 snack and thin liquids. No overt s/s aspiration observed. Pt was mildly impulsive, taking big bites and sips which required Min verbal cues to use swallow precautions. He demonstrated efficient mastication and oral clearance of solids throughout PO intake. As part of diagnostic treatment session for cognition, pt scored 12/30 on the [place] (scores of 26 or greater suggest cognitive function [facility]). Max (multiple choice) cues required for short term recall, as well as basic problem solving during math calculations. Pt was with intermittent awareness of errors, able to state "that's not right" however unable to correct  without Mod-Max A from clinician. Pt was with increasing fatigue throughout session, however able to sustain attention for ~5 minute intervals before cues for redirection. Prior to therapist leaving pt in bed with alarm set, pt stated he did not remember how to use his call bell for help; demonstration with teach back method provided. Continue per current plan of care.   Pain Pain Assessment Pain Scale: Faces Faces Pain Scale: Hurts a little bit Pain Type: Acute pain Pain Location: Head Pain Orientation: Medial;Right;Left Pain Descriptors / Indicators: Headache Pain Onset: On-going Pain Intervention(s): Emotional support;Distraction Multiple Pain Sites: No  Therapy/Group: Individual Therapy  Arbutus Leas 12/30/2018, 11:52 AM

## 2018-12-30 NOTE — Progress Notes (Signed)
Occupational Therapy Session Note  Patient Details  Name: Carlos Ferguson MRN: [id number] Date of Birth: 1966-03-26  Today's Date: 12/30/2018 OT Individual Time: 4360-6770 OT Individual Time Calculation (min): 60 min  and Today's Date: 12/30/2018 OT Missed Time: 15 Minutes Missed Time Reason: Patient fatigue  Short Term Goals: Week 1:  OT Short Term Goal 1 (Week 1): Pt will transfer with mod A +1 using LRAD in order to reduce caregiver burden OT Short Term Goal 2 (Week 1): Pt will complete LB dressing from bed level with min A OT Short Term Goal 3 (Week 1): Pt will tolerate sitting EOB/EOM with min A for 10 minutes during functional task with supervision to increase upright tolerance.  Skilled Therapeutic Interventions/Progress Updates:    Pt greeted almost prone in bed asleep, easy to wake and agreeable to try to participate in OT. Pt states he has had loose stools all day and felt like he had had another one. Pt needed total A for peri-care and brief change with loose incontinent BM. Nurse tech entered and asked if we could change bottom sheet, so worked on rolling L and R while changing bottom bed sheet. Pt took rest break, then agreeable to sit up at EOB. Pt needed max A to come to sitting. Step stool placed under LEs for balance 2/2 leg length. Pt needed min/CGA for sitting balance. Pt declined any bathing/dressing at EOB or in bed. Tolerated sitting for 6 minutes while discussing PLOF and hobbies. Pt stated he collects mens dress shoes and corning ware. Total A to return to bed. OT provided pt with level 3 green theraband. Pt completed 3 sets of 15 chest pull, and bicep curls. Pt reported reaching max fatigue and missed 15 minutes of skilled OT treatment session. Pt left semi-reclined in bed with bed alarm on, call bell in reach, and needs met. Informed nursing pt was back in the bed for cathing.    Therapy Documentation Precautions:  Precautions Precautions: Fall Restrictions Weight Bearing  Restrictions: No General: General OT Amount of Missed Time: 15 Minutes  Pain: Pain Assessment Pain Scale: Faces Faces Pain Scale: Hurts a little bit Pain Type: Acute pain Pain Location: Head Pain Orientation: Medial;Right;Left Pain Descriptors / Indicators: Headache Pain Onset: On-going Pain Intervention(s): Emotional support;Distraction Multiple Pain Sites: No   Therapy/Group: Individual Therapy  Valma Cava 12/30/2018, 3:22 PM

## 2018-12-30 NOTE — Progress Notes (Signed)
Pt home medications taken to pharmacy. Sam RN, second nurse to verify.

## 2018-12-30 NOTE — Progress Notes (Signed)
Physical Therapy Session Note  Patient Details  Name: Carlos Ferguson MRN: [id number] Date of Birth: [birth year]  Today's Date: 12/30/2018 PT Missed Time: 60 min Missed Time Reason: nausea     Short Term Goals: Week 1:  PT Short Term Goal 1 (Week 1): Pt will maintain sitting balance EOB with mod A PT Short Term Goal 2 (Week 1): Pt will complete least restrictive transfer with assist x 1 consistently PT Short Term Goal 3 (Week 1): Pt will initiate w/c mobility  Skilled Therapeutic Interventions/Progress Updates:    Attempted to see patient for scheduled PT session. Pt facedown in bed and reports feeling nauseous this AM. Pt unwilling to roll over onto his back and unwilling to participate in therapy session this AM due to nausea and fatigue. Pt left in bed with needs in reach. Will continue per POC.  Therapy Documentation Precautions:  Precautions Precautions: Fall Restrictions Weight Bearing Restrictions: No    Therapy/Group: Individual Therapy   Excell Seltzer, PT, DPT  12/30/2018, 7:51 AM

## 2018-12-31 NOTE — Care Management (Signed)
[facility] Individual Statement of Services  Patient Name:  Carlos Ferguson  Date:  12/31/2018  Welcome to the [facility].  Our goal is to provide you with an individualized program based on your diagnosis and situation, designed to meet your specific needs.  With this comprehensive rehabilitation program, you will be expected to participate in at least 3 hours of rehabilitation therapies Monday-Friday, with modified therapy programming on the weekends.  Your rehabilitation program will include the following services:  Physical Therapy (PT), Occupational Therapy (OT), Speech Therapy (ST), 24 hour per day rehabilitation nursing, Therapeutic Recreaction (TR), Neuropsychology, Case Management (Social Worker), Rehabilitation Medicine, Nutrition Services and Pharmacy Services  Weekly team conferences will be held on Wednesdays to discuss your progress.  Your Social Worker will talk with you frequently to get your input and to update you on team discussions.  Team conferences with you and your family in attendance may also be held.  Expected length of stay: 2/5-3 weeks   Overall anticipated outcome: minimal assistance at wheelchair  Depending on your progress and recovery, your program may change. Your Social Worker will coordinate services and will keep you informed of any changes. Your Social Worker's name and contact numbers are listed  below.  The following services may also be recommended but are not provided by the [facility] will be made to provide these services after discharge if needed.  Arrangements include referral to agencies that provide these services.  Your insurance has been verified to be:  Medicare only Your primary doctor is:  Passenger transport manager)  Pertinent information will be shared with your doctor and your  insurance company.  Social Worker:  [place], [place] or ([phone]   Information discussed with and copy given to patient by: Lennart Pall, 12/31/2018, 4:01 PM

## 2018-12-31 NOTE — Progress Notes (Signed)
[facility] PHYSICAL MEDICINE & REHABILITATION PROGRESS NOTE   Subjective/Complaints:  Pt reports no issues- sleeping OK- feels more awake last 2 days.  Objective:   No results found. Recent Labs    12/29/18 0529 12/30/18 0517  WBC 12.0* 11.2*  HGB 16.8 16.7  HCT 48.4 48.1  PLT 307 298   Recent Labs    12/29/18 0529 12/30/18 0517  NA 135 136  K 4.5 4.3  CL 96* 98  CO2 22 26  GLUCOSE 125* 108*  BUN 12 12  CREATININE 0.67 0.68  CALCIUM 9.3 9.3    Intake/Output Summary (Last 24 hours) at 12/31/2018 1122 Last data filed at 12/31/2018 1111 Gross per 24 hour  Intake 322 ml  Output 1200 ml  Net -878 ml     Physical Exam: Vital Signs Blood pressure (!) 143/77, pulse 67, temperature 99.6 F (37.6 C), temperature source Oral, resp. rate 18, SpO2 95 %.   Physical Exam  Nursing note and vitals reviewed. Constitutional: He appears well-developed and well-nourished.  Macrocephaly. Sitting up in bed; nursing giving meds; bright affect, alert, NAD HENT:  Head: Normocephalic and atraumatic.  Eyes:  Right eye exhibits abnormal extraocular motion and nystagmus. Left eye exhibits abnormal extraocular motion and nystagmus grossly Neck: No tracheal deviation present. No thyromegaly present. CV- RRR  Respiratory: CTA b/l.  GI: soft, NT, ND, protuberant, (+) hypoactive BS Musculoskeletal:     Comments: Left knee with multiple scars and deformity.  Bilateral feet well healed old incisions.   Mild lower extremity edema  Neurological: He is alert.  Dysarthria and ataxic speech and reports word finding deficits.   He was able to follow simple one step commands without difficulty and needed occasional cues with multi step commands.  Motor: Bilateral upper extremities: 4+/5 proximal distal No movement noted in bilateral lower extremities, however per report, paraplegia RLE>LLE with bilateral foot drop.   Skin:  Sacral dressing in place  Psychiatric: sleeping- woke  briefly    Assessment/Plan: 1. 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  Physiatrist is providing close team supervision and 24 hour management of active medical problems listed below.  Physiatrist and rehab team continue to assess barriers to discharge/monitor patient progress toward functional and medical goals  Care Tool:  Bathing    Body parts bathed by patient: Right arm, Left arm, Chest, Abdomen, Right upper leg, Left upper leg, Right lower leg, Left lower leg, Face   Body parts bathed by helper: Front perineal area, Buttocks     Bathing assist Assist Level: Moderate Assistance - Patient 50 - 74%     Upper Body Dressing/Undressing Upper body dressing   What is the patient wearing?: Pull over shirt    Upper body assist Assist Level: Minimal Assistance - Patient > 75%    Lower Body Dressing/Undressing Lower body dressing      What is the patient wearing?: Incontinence brief, Pants     Lower body assist Assist for lower body dressing: Total Assistance - Patient < 25%     Toileting Toileting    Toileting assist Assist for toileting: Total Assistance - Patient < 25%     Transfers Chair/bed transfer  Transfers assist  Chair/bed transfer activity did not occur: Safety/medical concerns  Chair/bed transfer assist level: 2 Helpers  Locomotion Ambulation   Ambulation assist   Ambulation activity did not occur: Safety/medical concerns          Walk 10 feet activity   Assist  Walk 10 feet activity did not occur: Safety/medical concerns        Walk 50 feet activity   Assist Walk 50 feet with 2 turns activity did not occur: Safety/medical concerns         Walk 150 feet activity   Assist Walk 150 feet  activity did not occur: Safety/medical concerns         Walk 10 feet on uneven surface  activity   Assist Walk 10 feet on uneven surfaces activity did not occur: Safety/medical concerns         Wheelchair     Assist Will patient use wheelchair at discharge?: Yes Type of Wheelchair: Manual Wheelchair activity did not occur: Safety/medical concerns         Wheelchair 50 feet with 2 turns activity    Assist    Wheelchair 50 feet with 2 turns activity did not occur: Safety/medical concerns       Wheelchair 150 feet activity     Assist  Wheelchair 150 feet activity did not occur: Safety/medical concerns       Blood pressure (!) 143/77, pulse 67, temperature 99.6 F (37.6 C), temperature source Oral, resp. rate 18, SpO2 95 %.   Medical Problem List and Plan: 1.  Deficits with mobility, transfers, self-care secondary to left cerebellar hemorrhage with history of strokes             Admit to CIR 2.  Antithrombotics: -DVT/anticoagulation:  Mechanical: Foot pump Bilateral lower extremities             -antiplatelet therapy: N/A due to bleed 3. Headaches/Pain Management: will add ultram prn.  4. Mood: LCSW to follow for evaluation and support.              -antipsychotic agents: N/A 5. Neuropsych: This patient is  Fully capable of making decisions on his own behalf. 6. Skin/Wound Care: Routine pressure relief measures.  7. Fluids/Electrolytes/Nutrition: Monitor I/Os. Tolerating current diet.  8. Headaches: Persistent but appears to be improving.  Depakote added today.   9. Leucocytosis with neurogenic bladder: Monitor for signs of infection.  CBC ordered for tomorrow a.m.  9/3- U/A shows likely colonization based on U/A- WBC is slightly up at 12k- will recheck in AM, since pt complains of no Sx's of infection- afebrile  9/4- WBC down to 11.4k- will recheck next week.  9/5- will change cathing to q4-6 hours 10. Hypokalemia: Continue daily supplement and  adjust as needed.  CMP ordered for tomorrow a.m.  9/3- K+ 4.5- con't supplements and monitor 11. HTN: Monitor BP tid--continue atenolol              Monitor with increased mobility 12. Seizure disorder: On Keppra 1500 mg bid.  Monitor reoccurrence. 15. Neurogenic bowel- pt goes 1-2x/week- requires manual disimpaction- will write for bowel program to be done 2x/week to disimpact bowels.'   9/4- had BM last night  16. Attention/fatigue- will try Provigil since doesn't raise BP as much 100 mg daily and see how he does 17. Vertigo/dizziness- don't want to add Meclizine to an already sedated gentleman- asked PT to give him rest breaks and will monitor for now.  18. R hip dysplasia- [facility]- will have to be put off.  19. Dispo- likely  14-17 days- will check with team      LOS: 3 days A FACE TO FACE EVALUATION WAS PERFORMED  Carlos Ferguson 12/31/2018, 11:22 AM

## 2018-12-31 NOTE — IPOC Note (Signed)
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)   Neurogenic bowel   Neurogenic bladder, flaccid     Functional Problem List: Nursing Bladder, Bowel, Edema, Endurance, Nutrition, Pain, Safety, Skin Integrity, Medication Management  PT Balance, Endurance, Motor, Pain, Safety, Sensory  OT Balance, Safety, Endurance, Pain, Motor, Cognition, Sensory  SLP Cognition, Nutrition  TR         Basic ADL's: OT Grooming, Bathing, Dressing, Toileting     Advanced  ADL's: OT       Transfers: PT Bed Mobility, Bed to Chair, Car, [employer], Futures trader, Metallurgist: PT Ambulation, Emergency planning/management officer, Stairs     Additional Impairments: OT None  SLP Swallowing, Communication, Social Cognition expression Problem Solving, Memory, Attention  TR      Anticipated Outcomes Item Anticipated Outcome  Self Feeding Indep  Swallowing  Supervision   Basic self-care  Supervision from bed level  Toileting  Self-caths   Bathroom Transfers TBD- does not use toilet as self-caths and modified bowel movements with incontinence. Not recommending shower transfer at this time  Bowel/Bladder  patient will be continent of bowel and performing self caths with min assist  Transfers  min A  Locomotion  min A overall at w/c level  Communication  Supervision  Cognition  Supervision  Pain  pain less than or equal to 4/10 with min assist  Safety/Judgment  free from falls/injury and making appropriate safety decisions with min assist   Therapy Plan: PT Intensity: Minimum of 1-2 x/day ,45 to 90 minutes PT Frequency: 5 out of 7 days PT Duration Estimated Length of Stay: 18-21 days OT Intensity: Minimum of 1-2  x/day, 45 to 90 minutes OT Frequency: 5 out of 7 days OT Duration/Estimated Length of Stay: 2.5-3 weeks SLP Intensity: Minumum of 1-2 x/day, 30 to 90 minutes SLP Frequency: 3 to 5 out of 7 days SLP Duration/Estimated Length of Stay: 2.5-3 weeks   Due to the current state of emergency, patients may not be receiving their 3-hours of Medicare-mandated therapy.   Team Interventions: Nursing Interventions Patient/Family Education, Bladder Management, Bowel Management, Disease Management/Prevention, Pain Management, Medication Management, Skin Care/Wound Management, Discharge Planning  PT interventions Ambulation/gait training, Balance/vestibular training, Cognitive remediation/compensation, Community reintegration, Discharge planning, Disease management/prevention, DME/adaptive equipment instruction, Functional electrical stimulation, Functional mobility training, Neuromuscular re-education, Pain management, Patient/family education, Psychosocial support, Splinting/orthotics, Therapeutic Activities, Therapeutic Exercise, UE/LE Strength taining/ROM, UE/LE Coordination activities, Wheelchair propulsion/positioning  OT Interventions Balance/vestibular training, Discharge planning, Pain management, Self Care/advanced ADL retraining, Therapeutic Activities, UE/LE Coordination activities, Therapeutic Exercise, Functional electrical stimulation, Cognitive remediation/compensation, Functional mobility training, Patient/family education, Skin care/wound managment, UE/LE Strength taining/ROM, Splinting/orthotics, Psychosocial support, Neuromuscular re-education, DME/adaptive equipment instruction, Wheelchair propulsion/positioning  SLP Interventions Cognitive remediation/compensation, Internal/external aids, Speech/Language facilitation, Cueing hierarchy, Environmental controls, Dysphagia/aspiration precaution training, Patient/family education, Functional tasks, Therapeutic Activities  TR Interventions    SW/CM  Interventions Discharge Planning, Psychosocial Support, Patient/Family Education   Barriers to Discharge MD  Medical stability, Incontinence, Neurogenic bowel and bladder, Weight, Medication compliance and Behavior  Nursing      PT Decreased caregiver support, Medical stability, Home environment access/layout    OT Decreased caregiver support Elderly father is only able to provide supervision assist  SLP      SW  Team Discharge Planning: Destination: PT-Home ,OT- Home , SLP-Home Projected Follow-up: PT-Home health PT, OT-  Home health OT, SLP-Home Health SLP, 24 hour supervision/assistance Projected Equipment Needs: PT-None recommended by PT, Sliding board, OT- To be determined, SLP-None recommended by SLP Equipment Details: PT-pt already owns crutches and manual w/c, OT-  Patient/family involved in discharge planning: PT- Patient,  OT-Patient, SLP-Patient  MD ELOS: 2.5- 3 weeks Medical Rehab Prognosis:  Good Assessment:  Pt is a 53 yr old male with L cerebellar hemorrhage with hx of strokes and also hx of spina bifida- and neurogenic bowel and bladder here for CIR- Headaches- started depakote- has mild leukocytosis- but no Signs of infection; has hx of seizures as well- had to put on bowel program/manual disimpaction. Biggest issue is fatigue- started provigil and vertigo - don't want to start meclizine due to sedation-  Goals for min assist to supervision   See Team Conference Notes for weekly updates to the plan of care

## 2018-12-31 NOTE — Progress Notes (Signed)
Patient requested to be cathed after only 3 hours. Said felt pressure on back similar to feeling that prompts him to self-cath at home. Cathed for 100 mL. Patient states feeling better.

## 2019-01-01 ENCOUNTER — Inpatient Hospital Stay (HOSPITAL_COMMUNITY): Payer: Medicare Other | Admitting: Occupational Therapy

## 2019-01-01 ENCOUNTER — Inpatient Hospital Stay (HOSPITAL_COMMUNITY): Payer: Medicare Other | Admitting: Physical Therapy

## 2019-01-01 MED ORDER — SENNA 8.6 MG PO TABS
1.0000 | ORAL_TABLET | Freq: Every day | ORAL | Status: DC
  Administered 2019-01-02 – 2019-02-02 (×31): 8.6 mg via ORAL
  Filled 2019-01-01 (×32): qty 1

## 2019-01-01 MED ORDER — ZINC OXIDE 40 % EX OINT
TOPICAL_OINTMENT | Freq: Three times a day (TID) | CUTANEOUS | Status: DC
  Administered 2019-01-01 – 2019-01-07 (×17): via TOPICAL
  Administered 2019-01-07: 1 via TOPICAL
  Administered 2019-01-07 – 2019-01-08 (×3): via TOPICAL
  Administered 2019-01-08: 1 via TOPICAL
  Administered 2019-01-09 – 2019-01-12 (×12): via TOPICAL
  Administered 2019-01-13: 1 via TOPICAL
  Administered 2019-01-13 (×2): via TOPICAL
  Administered 2019-01-14 (×2): 1 via TOPICAL
  Administered 2019-01-15: 14:00:00 via TOPICAL
  Administered 2019-01-15: 1 via TOPICAL
  Administered 2019-01-15 – 2019-01-16 (×4): via TOPICAL
  Administered 2019-01-17: 1 via TOPICAL
  Administered 2019-01-17 – 2019-01-20 (×11): via TOPICAL
  Administered 2019-01-21: 1 via TOPICAL
  Administered 2019-01-21 – 2019-02-02 (×33): via TOPICAL
  Filled 2019-01-01 (×3): qty 57

## 2019-01-01 NOTE — Progress Notes (Signed)
Discussed bowel program with patient. He agreed to allow for disimpaction nightly with Senna in the morning, his preference for night bowel program. Educated patient that he will need to maintain regular schedule of bowel program for 5 days to monitor outcome. Agreed to taking morning meds and having bowel program in the evening.

## 2019-01-01 NOTE — Progress Notes (Signed)
[place] PHYSICAL MEDICINE & REHABILITATION PROGRESS NOTE   Subjective/Complaints:  Pt reports butt is raw- doesn't have very good sensation on bottom, so can't really feel it, but nursing agrees it's real raw from frequent BMs.   Per nursing, having pasty- toothpaste texture BMs- multiple times per day. Sounds like hasn't been cleaned out as of yet.  Objective:   No results found. Recent Labs    12/30/18 0517  WBC 11.2*  HGB 16.7  HCT 48.1  PLT 298   Recent Labs    12/30/18 0517  NA 136  K 4.3  CL 98  CO2 26  GLUCOSE 108*  BUN 12  CREATININE 0.68  CALCIUM 9.3    Intake/Output Summary (Last 24 hours) at 01/01/2019 1010 Last data filed at 01/01/2019 0942 Gross per 24 hour  Intake 460 ml  Output 1000 ml  Net -540 ml     Physical Exam: Vital Signs Blood pressure (!) 141/87, pulse 66, temperature 98.3 F (36.8 C), temperature source Oral, resp. rate 18, weight 63.3 kg, SpO2 99 %.   Physical Exam  Nursing note and vitals reviewed. Constitutional: lying on L side in bed getting bottom cleaned up by NT/nursing, calm/bright affect, chronic dysarthria, NAD HENT:  Head: Normocephalic and atraumatic.  Eyes:  Right eye exhibits abnormal extraocular motion and nystagmus. Left eye exhibits abnormal extraocular motion and nystagmus grossly Neck: No tracheal deviation present. No thyromegaly present. CV- RRR  Respiratory: CTA b/l.  GI: soft, NT, ND, protuberant, (+) hypoactive BS Musculoskeletal:     Comments: Left knee with multiple scars and deformity.  Bilateral feet well healed old incisions.   Mild lower extremity edema  Neurological: Carlos Ferguson is alert.  Dysarthria and ataxic speech and reports word finding deficits.   Carlos Ferguson was able to follow simple one step commands without difficulty and needed occasional cues with multi step commands.  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   Skin:  Skin very raw- slightly open around anus, but otherwise ~ 8-10 cm wide on B/L buttocks just raw skin/bright pink Psychiatric: sleeping- woke briefly    Assessment/Plan: 1. 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  Physiatrist is providing close team supervision and 24 hour management of active medical problems listed below.  Physiatrist and rehab team continue to assess barriers to discharge/monitor patient progress toward functional and medical goals  Care Tool:  Bathing    Body parts bathed by patient: Right arm, Left arm, Chest, Abdomen, Right upper leg, Left upper leg, Right lower leg, Left lower leg, Face   Body parts bathed by helper: Front perineal area, Buttocks     Bathing assist Assist Level: Moderate Assistance - Patient 50 - 74%     Upper Body Dressing/Undressing Upper body dressing   What is the patient wearing?: Pull over shirt    Upper body assist Assist Level: Minimal Assistance - Patient > 75%    Lower Body Dressing/Undressing Lower body dressing      What is the patient wearing?: Incontinence brief, Pants     Lower body assist Assist for lower body dressing: Total Assistance - Patient < 25%     Toileting Toileting    Toileting assist Assist  for toileting: Total Assistance - Patient < 25%     Transfers Chair/bed transfer  Transfers assist  Chair/bed transfer activity did not occur: Safety/medical concerns  Chair/bed transfer assist level: 2 Helpers     Locomotion Ambulation   Ambulation assist   Ambulation activity did not occur: Safety/medical concerns          Walk 10 feet activity   Assist  Walk 10 feet activity did not occur:  Safety/medical concerns        Walk 50 feet activity   Assist Walk 50 feet with 2 turns activity did not occur: Safety/medical concerns         Walk 150 feet activity   Assist Walk 150 feet activity did not occur: Safety/medical concerns         Walk 10 feet on uneven surface  activity   Assist Walk 10 feet on uneven surfaces activity did not occur: Safety/medical concerns         Wheelchair     Assist Will patient use wheelchair at discharge?: Yes Type of Wheelchair: Manual Wheelchair activity did not occur: Safety/medical concerns         Wheelchair 50 feet with 2 turns activity    Assist    Wheelchair 50 feet with 2 turns activity did not occur: Safety/medical concerns       Wheelchair 150 feet activity     Assist  Wheelchair 150 feet activity did not occur: Safety/medical concerns       Blood pressure (!) 141/87, pulse 66, temperature 98.3 F (36.8 C), temperature source Oral, resp. rate 18, weight 63.3 kg, SpO2 99 %.   Medical Problem List and Plan: 1.  Deficits with mobility, transfers, self-care secondary to left cerebellar hemorrhage with history of strokes             Admit to CIR 2.  Antithrombotics: -DVT/anticoagulation:  Mechanical: Foot pump Bilateral lower extremities             -antiplatelet therapy: N/A due to bleed 3. Headaches/Pain Management: will add ultram prn.  4. Mood: LCSW to follow for evaluation and support.              -antipsychotic agents: N/A 5. Neuropsych: This patient is  Fully capable of making decisions on his own behalf. 6. Skin/Wound Care: Routine pressure relief measures.  7. Fluids/Electrolytes/Nutrition: Monitor I/Os. Tolerating current diet.  8. Headaches: Persistent but appears to be improving.  Depakote added today.   9. Leucocytosis with neurogenic bladder: Monitor for signs of infection.  CBC ordered for tomorrow a.m.  9/3- U/A shows likely colonization based on U/A- WBC is slightly up  at 12k- will recheck in AM, since pt complains of no Sx's of infection- afebrile  9/4- WBC down to 11.4k- will recheck next week.  9/5- will change cathing to q4-6 hours 10. Hypokalemia: Continue daily supplement and adjust as needed.  CMP ordered for tomorrow a.m.  9/3- K+ 4.5- con't supplements and monitor 11. HTN: Monitor BP tid--continue atenolol              Monitor with increased mobility  9/6- permissive HTN x 7 days total, then will get increased control/lower more.  12. Seizure disorder: On Keppra 1500 mg bid.  Monitor reoccurrence. 15. Neurogenic bowel- pt goes 1-2x/week- requires manual disimpaction- will write for bowel program to be done 2x/week to disimpact bowels.'   9/4- had BM last night  9/6- having frequent BMs- pasty- sounds like not cleaned out-  refuses "clean out"- ordered butt paste at least TID 16. Attention/fatigue- will try Provigil since doesn't raise BP as much 100 mg daily and see how Carlos Ferguson does 17. Vertigo/dizziness- don't want to add Meclizine to an already sedated gentleman- asked PT to give him rest breaks and will monitor for now.  18. R hip dysplasia- was due to go to [facility] for surgery- will have to be put off.  19. Dispo- likely 14-17 days- will check with team      LOS: 4 days A FACE TO FACE EVALUATION WAS PERFORMED  Carlos Ferguson 01/01/2019, 10:10 AM

## 2019-01-01 NOTE — Progress Notes (Signed)
Occupational Therapy Session Note  Patient Details  Name: Carlos Ferguson MRN: [id number] Date of Birth: 1966-04-14  Today's Date: 01/01/2019 OT Individual Time: 9628-3662 and 1445-1600 OT Individual Time Calculation (min): 71 min and 75 min  Short Term Goals: Week 1:  OT Short Term Goal 1 (Week 1): Pt will transfer with mod A +1 using LRAD in order to reduce caregiver burden OT Short Term Goal 2 (Week 1): Pt will complete LB dressing from bed level with min A OT Short Term Goal 3 (Week 1): Pt will tolerate sitting EOB/EOM with min A for 10 minutes during functional task with supervision to increase upright tolerance.    Skilled Therapeutic Interventions/Progress Updates:    Pt greeted in bed, c/o HA and not feeling well. His father was present and concerned that pts malaise was related to BP. OT assessed BP and 02 sats and relayed this information to RN. Per RN, vitals WNL. RN also provided pt with pain medicine at start of session. Pt was then agreeable to complete bathing/dressing tasks. Mod A for supine<long sitting in bed using bedrails. He was able to engage in ADL tasks with unilateral support on bedrail and supervision. CGA-Min A for balance without bedrail support. Pt returned to supine and OT completed perihygiene in the back. Pt noted to have foul smelling liquid and orange-colored BM. RN made aware and provided OT with prescription barrier ointment to apply after hygiene. Once OT donned brief he returned to long sitting to wash his feet. He also washed his hair with the shower cap and combed hair. This appeared to brighten his affect considerably. Pt declined participation in grooming tasks or EOB tasks due to fatigue. He was left comfortably in bed with all needs within reach and father still present. Bed alarm set and 4 bedrails up per request.   OT also provided him with lavender to use via inhalation to address HA symptoms during tx.   2nd Session 1:1 tx (75 min) Pt greeted in bed  with no c/o pain. Agreeable to transfer to the w/c! Started session with pericare completion bedlevel. Pt rolled Rt>Lt with Mod A for perihygiene and brief change post incontinent BM of similar consistency to BM in previous session. RN made aware. Pt assisted OT with donning his leg braces in long sitting. Max A for donning pants due to bulkiness of hardware and then he rolled Rt>Lt to pull them up fully. Total A for shoes. Note that pt requires increased time to process and follow instructions. Supine<sit completed with Min A for LE mgt.Tried to set up small step for transfer, but the positioning of LEs in braces made weightbearing difficult. Therefore he completed slideboard without it. Pt initially very fearful of transferring via board, requesting to attempt to use his crutches. +2 assist for sit<partial stand. When sitting down afterwards, pt recognized that he was unsteady and unsafe to transfer this way to the w/c. Agreeable to use the slideboard. 2nd person present due to pt request (as he was very nervous). RN assisted pt with leaning onto elbow while OT placed board. Pt then scooted across board with Min A! We celebrated! While sitting at the sink, he completed oral care and handwashing with supervision assist. Pt agreeable to sit up in w/c until after dinner. He was left with all needs within reach and safety belt fastened.   Pt required several pep talks before transferring into w/c due to feelings of nervousness/anxiety when EOB.      Therapy Documentation  Precautions:  Precautions Precautions: Fall Restrictions Weight Bearing Restrictions: No Vital Signs: Therapy Vitals Temp: 98.5 F (36.9 C) Temp Source: Oral Pulse Rate: (!) 51 Resp: 18 BP: (!) 149/86 Patient Position (if appropriate): Lying Oxygen Therapy SpO2: 99 % O2 Device: Room Air Pain: Pain Assessment Pain Scale: 0-10 Pain Score: 4  Pain Type: Acute pain Pain Location: Head Pain Descriptors / Indicators:  Headache Pain Frequency: Intermittent Pain Onset: Gradual Pain Intervention(s): Medication (See eMAR) ADL:    Therapy/Group: Individual Therapy  Markayla Reichart A Bassel Gaskill 01/01/2019, 12:40 PM

## 2019-01-01 NOTE — Progress Notes (Signed)
Physical Therapy Session Note  Patient Details  Name: Carlos Ferguson MRN: [id number] Date of Birth: 11-Jun-1965  Today's Date: 01/01/2019 PT Individual Time: 7897-8478 PT Individual Time Calculation (min): 55 min   Short Term Goals: Week 1:  PT Short Term Goal 1 (Week 1): Pt will maintain sitting balance EOB with mod A PT Short Term Goal 2 (Week 1): Pt will complete least restrictive transfer with assist x 1 consistently PT Short Term Goal 3 (Week 1): Pt will initiate w/c mobility  Skilled Therapeutic Interventions/Progress Updates: Pt presented in bed agreeable to therapy. Pt stating some pain in L hip but unrated. Pt advising feels pressure and that may need to be cathed, advised nsg. Pt also noted may have had BM. Performed rolling to L minA with use of bed rail and PTA providing minA at hips to complete rotation (+BM). Pt noted to have significant redness/rawness during peri-care, adv nsg and MD arrived to check as well. Pt was able to perform several rolls L/R with same technique and minA L/R. Pt then performed supine to sit with minA with bed flat to simulate home environment. Pt use PTA for leverage to complete transition however was able to perform >75% on own. Pt participated in sitting balance activities with single UE support reaching laterally, and superiorly. Pt with no LOB however although 4in step placed under pt's feet became fearful of falling and requested assistance returning to bed. Pt voiced some anxiety of loss of mobility with PTA providing emotional support and indicating that pt has only begun rehab process. Pt repostioned to comfort and left in bed with call bell within reach and current needs met.      Therapy Documentation Precautions:  Precautions Precautions: Fall Restrictions Weight Bearing Restrictions: No   Therapy/Group: Individual Therapy  Dalena Plantz  Willford Rabideau, PTA  01/01/2019, 1:10 PM

## 2019-01-02 ENCOUNTER — Inpatient Hospital Stay (HOSPITAL_COMMUNITY): Payer: Medicare Other

## 2019-01-02 ENCOUNTER — Inpatient Hospital Stay (HOSPITAL_COMMUNITY): Payer: Medicare Other | Admitting: Occupational Therapy

## 2019-01-02 ENCOUNTER — Inpatient Hospital Stay (HOSPITAL_COMMUNITY): Payer: Medicare Other | Admitting: Speech Pathology

## 2019-01-02 ENCOUNTER — Inpatient Hospital Stay (HOSPITAL_COMMUNITY): Payer: Medicare Other | Admitting: Physical Therapy

## 2019-01-02 DIAGNOSIS — R0989 Other specified symptoms and signs involving the circulatory and respiratory systems: Secondary | ICD-10-CM

## 2019-01-02 DIAGNOSIS — I69391 Dysphagia following cerebral infarction: Secondary | ICD-10-CM

## 2019-01-02 DIAGNOSIS — R569 Unspecified convulsions: Secondary | ICD-10-CM

## 2019-01-02 DIAGNOSIS — R197 Diarrhea, unspecified: Secondary | ICD-10-CM

## 2019-01-02 DIAGNOSIS — E876 Hypokalemia: Secondary | ICD-10-CM

## 2019-01-02 NOTE — Progress Notes (Signed)
Occupational Therapy Session Note  Patient Details  Name: Carlos Ferguson MRN: [id number] Date of Birth: [birth year]  Today's Date: 01/02/2019 OT Individual Time: 1300-1400 OT Individual Time Calculation (min): 60 min    Short Term Goals: Week 1:  OT Short Term Goal 1 (Week 1): Pt will transfer with mod A +1 using LRAD in order to reduce caregiver burden OT Short Term Goal 2 (Week 1): Pt will complete LB dressing from bed level with min A OT Short Term Goal 3 (Week 1): Pt will tolerate sitting EOB/EOM with min A for 10 minutes during functional task with supervision to increase upright tolerance.  Skilled Therapeutic Interventions/Progress Updates:    Pt seen for OT session focusing on ADL re-training. Pt sitting up in w/c upon arrival, agreeable to tx session and denying pain.  Lunch trays just arriving and therapist providing supervision assist for lunch per SLP guidelines. Set-up assist provided and 1 VC to slow rate of speed with eating and throat clear. While pt eating, discussed therapy goals, PLOF and activity progression. Pt reports not liking hospital meal and education provided regarding meal options on current diet. Therapist assisted with calling cafeteria and assisting pt in ordering foods that he prefers.  In therapy gym, attempted sit>stand in STEDY. Pt required max A to power into standing. However, due to pt's body habitus, he is too short/ not able to stand erect enough to be tall enough to utilize pads of STEDY seat, therefore this will be not a viable transfer option for nursing/therapy staff to use. Upon attempts at standing, pt noted to have been incontinent of bowel. Returned to room in w/c and completed sliding board transfer with min-mod A +2 for safety to return to bed. Total A +2 for clothing management and hygiene following incontinent episode. RN made aware of incontinent liquid stool event. Pt left in supine at end of session with NT still present assisting with hygiene.    Therapy Documentation Precautions:  Precautions Precautions: Fall Restrictions Weight Bearing Restrictions: No   Therapy/Group: Individual Therapy  Carlos Ferguson L 01/02/2019, 7:15 AM

## 2019-01-02 NOTE — Progress Notes (Signed)
Physical Therapy Session Note  Patient Details  Name: Carlos Ferguson MRN: [id number] Date of Birth: October 28, 1965  Today's Date: 01/02/2019 PT Individual Time: 1045-1200 PT Individual Time Calculation (min): 75 min   Short Term Goals: Week 1:  PT Short Term Goal 1 (Week 1): Pt will maintain sitting balance EOB with mod A PT Short Term Goal 2 (Week 1): Pt will complete least restrictive transfer with assist x 1 consistently PT Short Term Goal 3 (Week 1): Pt will initiate w/c mobility  Skilled Therapeutic Interventions/Progress Updates:    Pt received sidelying in bed, reports not feeling well but agreeable to therapy session. No complaints of pain, pt just reports ongoing dizziness and his head feeling congested. Pt is dependent to don braces, shoes, and pants while supine in bed. Supine to sit with min A with use of bedrail. Slide board transfer bed to w/c with min A with 4" step under BLE. Manual w/c propulsion x 150 ft with use of BUE, min A for steering at times. Slide board transfer w/c to/from mat table with min A. Pt is able to maintain sitting balance with close SBA. Pt tends to sit in "tripod" position, encouraged him to lean anteriorly for decreased risk of sliding forwards off of table. Sit to stand to RW x 3 reps with max A, unable to fully lock LLE brace in standing. Pt also reports having trouble with grip on RW. Pt has grip assist on his crutches that is removable and can be placed on RW handles, will attempt next session. Pt left seated in w/c in room with needs in reach, quick release belt and chair alarm in place at end of session.  Therapy Documentation Precautions:  Precautions Precautions: Fall Restrictions Weight Bearing Restrictions: No    Therapy/Group: Individual Therapy   Excell Seltzer, PT, DPT  01/02/2019, 12:51 PM

## 2019-01-02 NOTE — Progress Notes (Signed)
[place] PHYSICAL MEDICINE & REHABILITATION PROGRESS NOTE   Subjective/Complaints: Patient seen laying in bed this morning.  He states he did not sleep well overnight because he was restless.  ROS: + Diarrhea.  Denies CP, shortness of breath, nausea, vomiting.  Objective:   No results found. No results for input(s): WBC, HGB, HCT, PLT in the last 72 hours. No results for input(s): NA, K, CL, CO2, GLUCOSE, BUN, CREATININE, CALCIUM in the last 72 hours.  Intake/Output Summary (Last 24 hours) at 01/02/2019 0956 Last data filed at 01/02/2019 0807 Gross per 24 hour  Intake 336 ml  Output 800 ml  Net -464 ml     Physical Exam: Vital Signs Blood pressure 138/76, pulse 63, temperature 98.2 F (36.8 C), temperature source Oral, resp. rate 16, weight 63.3 kg, SpO2 100 %.   Physical Exam  Constitutional: No distress . Vital signs reviewed. HENT: Normocephalic.  Atraumatic. Eyes: Abnormal extraocular motions.  No discharge. Cardiovascular: No JVD. Respiratory: Normal effort.  No stridor. GI: Non-distended. Skin: Warm and dry.  Intact. Psych: Normal mood.  Normal behavior. 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   Skin: Sacral/buttock wound not examined today Psychiatric: Flat.   Assessment/Plan: 1. 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  Physiatrist is providing close team supervision and 24 hour management of active medical problems listed below.  Physiatrist and rehab team continue to  assess barriers to discharge/monitor patient progress toward functional and medical goals  Care Tool:  Bathing    Body parts bathed by patient: Right arm, Left arm, Chest, Abdomen, Right upper leg, Left upper leg, Right lower leg, Left lower leg, Face, Front perineal area   Body parts bathed by helper: Buttocks     Bathing assist Assist Level: Moderate Assistance - Patient 50 - 74%     Upper Body Dressing/Undressing Upper body dressing   What is the patient wearing?: Pull over shirt    Upper body assist Assist Level: Minimal Assistance - Patient > 75%    Lower Body Dressing/Undressing Lower body dressing      What is the patient wearing?: Incontinence brief, Pants     Lower body assist Assist for lower body dressing: Total Assistance - Patient < 25%     Toileting Toileting    Toileting assist Assist for toileting: Total Assistance - Patient < 25%     Transfers Chair/bed transfer  Transfers assist  Chair/bed transfer activity did not occur: Safety/medical concerns  Chair/bed transfer assist level: 2 Helpers     Locomotion Ambulation   Ambulation assist   Ambulation activity did not occur: Safety/medical concerns          Walk 10 feet activity   Assist  Walk 10 feet activity did not occur: Safety/medical concerns        Walk 50 feet activity   Assist Walk 50 feet with 2 turns activity did not occur: Safety/medical concerns         Walk 150 feet activity   Assist Walk 150 feet activity did not occur: Safety/medical concerns         Walk 10  feet on uneven surface  activity   Assist Walk 10 feet on uneven surfaces activity did not occur: Safety/medical concerns         Wheelchair     Assist Will patient use wheelchair at discharge?: Yes Type of Wheelchair: Manual Wheelchair activity did not occur: Safety/medical concerns         Wheelchair 50 feet with 2 turns activity    Assist    Wheelchair 50 feet with 2  turns activity did not occur: Safety/medical concerns       Wheelchair 150 feet activity     Assist  Wheelchair 150 feet activity did not occur: Safety/medical concerns       Blood pressure 138/76, pulse 63, temperature 98.2 F (36.8 C), temperature source Oral, resp. rate 16, weight 63.3 kg, SpO2 100 %.   Medical Problem List and Plan: 1.  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  Antithrombotics: -DVT/anticoagulation:  Mechanical: Foot pump Bilateral lower extremities             -antiplatelet therapy: N/A due to bleed 3. Headaches/Pain Management: Added Ultram prn.  4. Mood: LCSW to follow for evaluation and support.              -antipsychotic agents: N/A 5. Neuropsych: This patient is fully capable of making decisions on his own behalf. 6. Skin/Wound Care: Routine pressure relief measures.  7. Fluids/Electrolytes/Nutrition: Monitor I/Os. Tolerating current diet.  8. Headaches: Persistent but appears to be improving.  Depakote added. 9. Leucocytosis with neurogenic bladder: Monitor for signs of infection.    WBC 11.2 on 9/4, improving  Continue to monitor 10. Hypokalemia: Continue daily supplement and adjust as needed.  CMP ordered for tomorrow a.m.  Potassium 4.3 on 9/4 11. HTN: Monitor BP tid--continue atenolol              Monitor with increased mobility  Slightly labile 9/7 12. Seizure disorder: Continue Keppra 1500 mg bid.  Monitor reoccurrence. 15. Neurogenic bowel- pt goes 1-2x/week- requires manual disimpaction- will write for bowel program to be done 2x/week to disimpact bowels.'   KUB ordered 16. Attention/fatigue trial Provigil since doesn't raise BP as much 100 mg daily  17. Vertigo/dizziness- don't want to add Meclizine to an already sedated gentleman- asked PT to give him rest breaks and will monitor for now.  18. R hip dysplasia- was due to go to [facility] for surgery-on hold  at present 19.  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

## 2019-01-02 NOTE — Progress Notes (Signed)
Speech Language Pathology Daily Session Note  Patient Details  Name: Carlos Ferguson MRN: [id number] Date of Birth: [birth year]  Today's Date: 01/02/2019 SLP Individual Time: 4403-4742 SLP Individual Time Calculation (min): 45 min  Short Term Goals: Week 1: SLP Short Term Goal 1 (Week 1): Patient will consume current diet with minimal overt s/s of aspiration and Mod A verbal cues for use of swallowing compensatory strategies. SLP Short Term Goal 2 (Week 1): Patient will demonstrate sustained attention to functional tasks for ~15 minutes with Min A verbal cues for redirection. SLP Short Term Goal 3 (Week 1): Patient will recall new, daily information with Min A verbal and visual cues. SLP Short Term Goal 4 (Week 1): Patient will demonstrate functional problem solving for basic and familiar tasks with Min A verbal cues. SLP Short Term Goal 5 (Week 1): Patient will utilize speech intelligibility strategies at the phrase level with Min A verbal cues to achieve ~90% intelligibility.  Skilled Therapeutic Interventions: Pt was seen for skilled ST services focused on cognitive goals. Pt's RN was present upon therapist's arrival. Pt found to have had an incontient bowel movement, thus SLP and RN changed pt into clean dry brief. During brief change, pt carried out 1-step directions with Min verbal cues and tactile assistance to reposition himself throughout brief change. SLP also facilitated session with Max faded to Mod cues for organization and math calculations during a basic money management activity (ALFA money). In functional conversation, pt conveyed he did not know how to locate certain information/functions on his cell phone anymore. SLP provided Max faded to Mod A (with teach back method) to find photos, delete unwanted information, and locate text messages on pt's phone. Pt was left in bed with alarm set and all needs within reach. Continue per current plan of care.      Pain Pain Assessment Pain  Score: 0-No pain  Therapy/Group: Individual Therapy  Arbutus Leas 01/02/2019, 9:54 AM

## 2019-01-03 ENCOUNTER — Inpatient Hospital Stay (HOSPITAL_COMMUNITY): Payer: Medicare Other | Admitting: Physical Therapy

## 2019-01-03 ENCOUNTER — Inpatient Hospital Stay (HOSPITAL_COMMUNITY): Payer: Medicare Other

## 2019-01-03 ENCOUNTER — Inpatient Hospital Stay (HOSPITAL_COMMUNITY): Payer: Medicare Other | Admitting: Occupational Therapy

## 2019-01-03 NOTE — Progress Notes (Signed)
PHYSICAL MEDICINE & REHABILITATION PROGRESS NOTE   Subjective/Complaints: Patient reports loose stools have improved- getting "less often", but doesn't know if buttocks are less raw or not.    ROS: + Diarrhea- has improved.  Denies CP, shortness of breath, nausea, vomiting.  Objective:   Dg Abd 1 View  Result Date: 01/02/2019 CLINICAL DATA:  Extensive hx. Pt with n/v/d today. EXAM: ABDOMEN - 1 VIEW COMPARISON:  12/25/2018 FINDINGS: Bowel gas pattern is nonobstructive. No free intraperitoneal air identified on this supine view. No evidence for organomegaly. No abnormal calcifications. Remote RIGHT hip surgery. Chronic dysplasia of the RIGHT hip. Spinal dysraphism. IMPRESSION: Nonobstructive bowel gas pattern. Electronically Signed   By: Norva PavlovElizabeth  Brown M.D.   On: 01/02/2019 14:59   No results for input(s): WBC, HGB, HCT, PLT in the last 72 hours. No results for input(s): NA, K, CL, CO2, GLUCOSE, BUN, CREATININE, CALCIUM in the last 72 hours.  Intake/Output Summary (Last 24 hours) at 01/03/2019 1254 Last data filed at 01/03/2019 0809 Gross per 24 hour  Intake 320 ml  Output 945 ml  Net -625 ml     Physical Exam: Vital Signs Blood pressure (!) 141/91, pulse 71, temperature 98 F (36.7 C), temperature source Oral, resp. rate 18, height 4\' 9"  (1.448 m), weight 63.3 kg, SpO2 99 %.   Physical Exam  Constitutional: No distress . Vital signs reviewed. Awake, alert, appropriate, lying supine in bed, getting meds from nursing, hair disheveled, slightly dysarthric as normal HENT: Normocephalic.  Atraumatic. Eyes: Abnormal extraocular motions.  No discharge. Cardiovascular: RRR Respiratory: CTA B/L GI: Non-distended. Soft, NT Skin: Warm and dry.  Intact. Psych: Normal mood.  Normal behavior. Musc: Lower extremities with healed incisions and deformities Neurological: Alert Dysarthria Able to follow commands Motor: Bilateral upper extremities: 4+/5 proximal distal,  unchanged No movement noted in bilateral lower extremities, however per report, paraplegia RLE>LLE with bilateral foot drop.   Skin: Sacral/buttock wound not examined today Psychiatric: Flat.   Assessment/Plan: 1. 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  Physiatrist is providing close team supervision and 24 hour management of active medical problems listed below.  Physiatrist and rehab team continue to assess barriers to discharge/monitor patient progress toward functional and medical goals  Care Tool:  Bathing    Body parts bathed by patient: Right arm, Left arm, Chest, Abdomen, Right upper leg, Left upper leg, Right lower leg, Left lower leg, Face, Front perineal area   Body parts bathed by helper: Buttocks     Bathing assist Assist Level: Moderate Assistance - Patient 50 - 74%     Upper Body Dressing/Undressing Upper body dressing   What is the patient wearing?: Pull over shirt    Upper body assist Assist Level: Minimal Assistance - Patient > 75%    Lower Body Dressing/Undressing Lower body dressing      What is the patient wearing?: Incontinence brief, Pants     Lower body assist Assist for lower body dressing: Total Assistance - Patient < 25%     Toileting Toileting    Toileting assist Assist for toileting: 2 Helpers     Transfers Chair/bed transfer  Transfers assist  Chair/bed transfer activity did not occur: Safety/medical concerns  Chair/bed transfer assist level: Minimal Assistance - Patient > 75%  Locomotion Ambulation   Ambulation assist   Ambulation activity did not occur: Safety/medical concerns          Walk 10 feet activity   Assist  Walk 10 feet activity did not occur: Safety/medical  concerns        Walk 50 feet activity   Assist Walk 50 feet with 2 turns activity did not occur: Safety/medical concerns         Walk 150 feet activity   Assist Walk 150 feet activity did not occur: Safety/medical concerns         Walk 10 feet on uneven surface  activity   Assist Walk 10 feet on uneven surfaces activity did not occur: Safety/medical concerns         Wheelchair     Assist Will patient use wheelchair at discharge?: Yes Type of Wheelchair: Manual Wheelchair activity did not occur: Safety/medical concerns  Wheelchair assist level: Minimal Assistance - Patient > 75% Max wheelchair distance: 100'    Wheelchair 50 feet with 2 turns activity    Assist    Wheelchair 50 feet with 2 turns activity did not occur: Safety/medical concerns   Assist Level: Minimal Assistance - Patient > 75%   Wheelchair 150 feet activity     Assist  Wheelchair 150 feet activity did not occur: Safety/medical concerns   Assist Level: Minimal Assistance - Patient > 75%   Blood pressure (!) 141/91, pulse 71, temperature 98 F (36.7 C), temperature source Oral, resp. rate 18, height 4\' 9"  (1.448 m), weight 63.3 kg, SpO2 99 %.   Medical Problem List and Plan: 1.  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  Antithrombotics: -DVT/anticoagulation:  Mechanical: Foot pump Bilateral lower extremities             -antiplatelet therapy: N/A due to bleed 3. Headaches/Pain Management: Added Ultram prn.  4. Mood: LCSW to follow for evaluation and support.              -antipsychotic agents: N/A 5. Neuropsych: This patient is fully capable of making decisions on his own behalf. 6. Skin/Wound Care: Routine pressure relief measures.  7. Fluids/Electrolytes/Nutrition: Monitor I/Os. Tolerating current diet.  8. Headaches: Persistent but appears to be improving.  Depakote added. 9.  Leucocytosis with neurogenic bladder: Monitor for signs of infection.    WBC 11.2 on 9/4, improving  Continue to monitor 10. Hypokalemia: Continue daily supplement and adjust as needed.  CMP ordered for tomorrow a.m.  Potassium 4.3 on 9/4 11. HTN: Monitor BP tid--continue atenolol              Monitor with increased mobility  Slightly labile 9/7 12. Seizure disorder: Continue Keppra 1500 mg bid.  Monitor reoccurrence. 15. Neurogenic bowel- pt goes 1-2x/week- requires manual disimpaction- will write for bowel program to be done 2x/week to disimpact bowels.'   KUB ordered 16. Attention/fatigue trial Provigil since doesn't raise BP as much 100 mg daily  17. Vertigo/dizziness- don't want to add Meclizine to an already sedated gentleman- asked PT to give him rest breaks and will monitor for now.  18. R hip dysplasia- was due to go to [facility] for surgery-on hold at present 19.  Post stroke dysphagia: D3 thins, advance diet as tolerated   LOS: 6 days A FACE TO FACE EVALUATION WAS PERFORMED  Waverley Krempasky 01/03/2019, 12:54 PM

## 2019-01-03 NOTE — Progress Notes (Signed)
Pt c/o of "night sweating", R lower back pain and dizziness with movement in bed. Pt continues to have incontinent diarrhea cause pain and irritation to the rectum, with minor bleeding.  Pt stated  " I need to go to the ER, something is wrong with me and no one is concerned". This nurse with [facility] assessed the pt. All vital signs stable, pts clothes were damp from sweating. Medication was given for the back pain. Provider will be notified during morning rounds. Will continue to monitor.

## 2019-01-03 NOTE — Progress Notes (Signed)
Physical Therapy Session Note  Patient Details  Name: Carlos Ferguson MRN: [id number] Date of Birth: 05-Oct-1965  Today's Date: 01/03/2019 PT Individual Time: 1045-1200 PT Individual Time Calculation (min): 75 min   Short Term Goals: Week 1:  PT Short Term Goal 1 (Week 1): Pt will maintain sitting balance EOB with mod A PT Short Term Goal 2 (Week 1): Pt will complete least restrictive transfer with assist x 1 consistently PT Short Term Goal 3 (Week 1): Pt will initiate w/c mobility  Skilled Therapeutic Interventions/Progress Updates:    Pt received sidelying in bed, agreeable to PT session. No complaints of pain but does report ongoing dizziness this session. Pt rates dizziness 3/10 while sidelying or supine, increases to 5/10 once seated EOB and remains at 5 or below for remainder of session. Dependent to don BLE braces, pants, and shoes with min A for rolling. Supine to sit with mod A. Slide board transfer bed to w/c with min A. Manual w/c propulsion x 100 ft with use of BUE and min A. Slide board transfer w/c to/from mat table with min A. Sit to stand x 2 reps on 4" step to elevated RW and mat table for ideal body positioning. Pt is able to come to a full stand with first repetition and remain standing x 30 sec. Pt unable to balance with 2nd stand due to inability to extend L hip and pull LLE under him in stance. Pt reports he has always stood with L leg brace locked in extension. Pt reports feeling fatigued after 2 stands and declines further attempts. Assisted pt back to his room. Setup A to brush teeth at sink. Pt left seated in w/c in room with needs in reach, quick release belt in place at end of session.  Therapy Documentation Precautions:  Precautions Precautions: Fall Restrictions Weight Bearing Restrictions: No    Therapy/Group: Individual Therapy   Excell Seltzer, PT, DPT  01/03/2019, 12:54 PM

## 2019-01-03 NOTE — Progress Notes (Signed)
Occupational Therapy Session Note  Patient Details  Name: Carlos Ferguson MRN: [id number] Date of Birth: 03-04-1966  Today's Date: 01/03/2019 OT Individual Time: 1400-1500 OT Individual Time Calculation (min): 60 min    Short Term Goals: Week 1:  OT Short Term Goal 1 (Week 1): Pt will transfer with mod A +1 using LRAD in order to reduce caregiver burden OT Short Term Goal 2 (Week 1): Pt will complete LB dressing from bed level with min A OT Short Term Goal 3 (Week 1): Pt will tolerate sitting EOB/EOM with min A for 10 minutes during functional task with supervision to increase upright tolerance.  Skilled Therapeutic Interventions/Progress Updates:    Pt seen for OT session focusing on self-cathing and ADL re-training. Pt sitting up in w/c, requesting urgent need to cath. Pt agreeable to trial self-cathing from w/c level. He was able to manage clothing in order to access self. Required assist for set-up and VCs for technique to ensure sterile process. Max A overall for self-cathing from w/c level. Upon managing clothing, noted by to have overflowed bladder and brief saturated, pt without awareness. RN made aware.  Pt completed min A sliding board transfer back to bed in order to have brief changed. Max cuing for technique and anterior weight shift during transfer.  Mod A to return to supine. Supervision-min A using hospital bed functions in order to roll to complete clothing management. Upon rolling, pt noted to have been incontinent of unformed stool, pt without awareness. Pt complete pericare hygiene, total A for buttock hygiene and brief to be donned. Pt completed UB/LB bathing/dressing in long sitting position with supervision overall, occasional min A for dynamic balance and increased time for processing of VCs. Min A UB dressing with assist for clothing orientation. Max A LB dressing with assist to thread on pants from long sit position and return to supine to roll to have pants pulled up. Pt  left in supine at end of session, all needs in reach and bed alarm on. Education/discussion throughout session regarding modified ADLs, reducing caregiver burden, and d/c planning.   Therapy Documentation Precautions:  Precautions Precautions: Fall Restrictions Weight Bearing Restrictions: No   Therapy/Group: Individual Therapy  Genell Thede L 01/03/2019, 7:05 AM

## 2019-01-03 NOTE — Progress Notes (Signed)
Speech Language Pathology Daily Session Note  Patient Details  Name: Carlos Ferguson MRN: [id number] Date of Birth: Dec 25, 1965  Today's Date: 01/03/2019 SLP Individual Time: 2440-1027 SLP Individual Time Calculation (min): 57 min  Short Term Goals: Week 1: SLP Short Term Goal 1 (Week 1): Patient will consume current diet with minimal overt s/s of aspiration and Mod A verbal cues for use of swallowing compensatory strategies. SLP Short Term Goal 2 (Week 1): Patient will demonstrate sustained attention to functional tasks for ~15 minutes with Min A verbal cues for redirection. SLP Short Term Goal 3 (Week 1): Patient will recall new, daily information with Min A verbal and visual cues. SLP Short Term Goal 4 (Week 1): Patient will demonstrate functional problem solving for basic and familiar tasks with Min A verbal cues. SLP Short Term Goal 5 (Week 1): Patient will utilize speech intelligibility strategies at the phrase level with Min A verbal cues to achieve ~90% intelligibility.  Skilled Therapeutic Interventions: Skilled ST services focused on cognitive skills. SLP facilitated recall of yesterday's ST session, pt required min A verbal cues to recall money management and phone navigation strategies. Pt demonstrated ability to locate missed calls, text messages and required mod A verbal cues to locate end call button during practice calls to pt's father. SLP also facilitated basic problem solving, sustained attention and recall with visual aid in novel card task played at simplest level (Blink), pt required mod A fade to min A verbal cues for problem solving (most difficulty matching by number), supervision A verbal cues to recall three rules with visual aid and sustained attention in 15 minute intervals. Pt demonstrated speech intelligibility at phrase level with 90% intelligibility. Pt was left in room with call bell within reach and bed alarm set. ST recommends to continue skilled ST services.       Pain Pain Assessment Pain Score: 0-No pain  Therapy/Group: Individual Therapy  Eustacia Urbanek  [facility] 01/03/2019, 9:58 AM

## 2019-01-04 ENCOUNTER — Inpatient Hospital Stay (HOSPITAL_COMMUNITY): Payer: Medicare Other

## 2019-01-04 ENCOUNTER — Inpatient Hospital Stay (HOSPITAL_COMMUNITY): Payer: Medicare Other | Admitting: Occupational Therapy

## 2019-01-04 ENCOUNTER — Inpatient Hospital Stay (HOSPITAL_COMMUNITY): Payer: Medicare Other | Admitting: Physical Therapy

## 2019-01-04 LAB — CBC
HCT: 47.4 % (ref 39.0–52.0)
Hemoglobin: 16.5 g/dL (ref 13.0–17.0)
MCH: 31.8 pg (ref 26.0–34.0)
MCHC: 34.8 g/dL (ref 30.0–36.0)
MCV: 91.3 fL (ref 80.0–100.0)
Platelets: 288 10*3/uL (ref 150–400)
RBC: 5.19 MIL/uL (ref 4.22–5.81)
RDW: 13.4 % (ref 11.5–15.5)
WBC: 7.5 10*3/uL (ref 4.0–10.5)
nRBC: 0 % (ref 0.0–0.2)

## 2019-01-04 LAB — BASIC METABOLIC PANEL
Anion gap: 10 (ref 5–15)
BUN: 18 mg/dL (ref 6–20)
CO2: 25 mmol/L (ref 22–32)
Calcium: 9.3 mg/dL (ref 8.9–10.3)
Chloride: 105 mmol/L (ref 98–111)
Creatinine, Ser: 0.8 mg/dL (ref 0.61–1.24)
GFR calc Af Amer: >60 mL/min (ref >60)
GFR calc non Af Amer: >60 mL/min (ref >60)
Glucose, Bld: 94 mg/dL (ref 70–99)
Potassium: 4.4 mmol/L (ref 3.5–5.1)
Sodium: 140 mmol/L (ref 135–145)

## 2019-01-04 NOTE — Progress Notes (Signed)
Occupational Therapy Session Note  Patient Details  Name: Carlos Ferguson MRN: [id number] Date of Birth: 1965/09/23  Today's Date: 01/04/2019  OT Individual Time: [id number] and 1115-1200 OT Individual Time Calculation (min): 60 min and 45 min   Short Term Goals: Week 1:  OT Short Term Goal 1 (Week 1): Pt will transfer with mod A +1 using LRAD in order to reduce caregiver burden OT Short Term Goal 2 (Week 1): Pt will complete LB dressing from bed level with min A OT Short Term Goal 3 (Week 1): Pt will tolerate sitting EOB/EOM with min A for 10 minutes during functional task with supervision to increase upright tolerance.  Skilled Therapeutic Interventions/Progress Updates:    Session One: Pt seen for OT ADL Bathing/dressing session. Pt awake in supine upon arrival, agreeable to tx session. Cont with complaints of chronic headache, MD made aware during AM Carlos Ferguson and pt denying need for intervention. He reports needing to bathe/dress and possible incontinence in brief. Completed bed mobility with supervision-min A using hospital bed functions. Brief changed, total A for buttock hygiene and pt able to complete peri-care in supine. He transitioned into long sitting using bed rails with min A. Completed UB/LB (with exception of pericare/buttock hygiene) with supervision, occasional use of bed rails to steady self. HE donned shirt in long sitting with set-up. Able to thread B LEs into pants with min A and rolled in order to have pants pulled up total A. Total A to don socks and B AFOs and shoes.  He transferred to sitting EOB with min A using hospital bed functions with multi-modal cuing for scooting forward and positioining. Min-mod A sliding board transfer to w/c with max cuing for anterior weight shift. Pt set-up with breakfast tray, supervision/  Set-up provided, pt able to self-regulate swallowing pre-cautions per SLP orders. Pt left sitting up in w/c at end of session with hand off to NT to cont  supervision for meal. All needs in reach and chair belt alarm on.  Education/discussion throughout session regarding d/c planning, DME and plan for d/c at w/c level with plan for bathing/dressing in long sitting. Pt agreeable to hospital bed at d/c for increased independence and to reduce caregiver burden.   Session Two: Pt seen for OT session focusing on functional transfers and sitting balance/endurance. Pt sitting up in w/c upon arrival, agreeable to tx session and denying pain.  Noted pt to have shirt on backwards. He required significantly increased time for problem solving clothing orientation and self-correcting error and min A overall.  He self propelled w/c to therapy gym with min A for steering. Min A slide board transfer to therapy mat with assist to place board and VCs for weight shift. Seated EOM, completed dynamic reaching task with pt reaching forward to obtain cup and stack to side in order to facilitate anterior weight shift in prep for transfer. Pt required increased time and encouragement for anterior weight shift due to fear of falling. Pt noted to have vertical nystagmus with eye/head movements with pt reporting dizziness with task. Education/demosntration for gaze stabilization and head turn with movement. Completed x4 trials on R/L with rest break btwn trials. Education provided regarding functional implications of anterior weight shift.  He returned to w/c at end of session in same manner as described above and taken back to room. Transitioned back to bed at end of session, left in supine with all needs in reach and bed alarm on.     Therapy Documentation Precautions:  Precautions Precautions: Fall Restrictions Weight Bearing Restrictions: No   Therapy/Group: Individual Therapy  Carlos Ferguson L 01/04/2019, 6:56 AM

## 2019-01-04 NOTE — Progress Notes (Signed)
Speech Language Pathology Daily Session Note  Patient Details  Name: Carlos Ferguson MRN: [id number] Date of Birth: August 10, 1965  Today's Date: 01/04/2019 SLP Individual Time: 0910-0930 SLP Individual Time Calculation (min): 20 min  Short Term Goals: Week 1: SLP Short Term Goal 1 (Week 1): Patient will consume current diet with minimal overt s/s of aspiration and Mod A verbal cues for use of swallowing compensatory strategies. SLP Short Term Goal 2 (Week 1): Patient will demonstrate sustained attention to functional tasks for ~15 minutes with Min A verbal cues for redirection. SLP Short Term Goal 3 (Week 1): Patient will recall new, daily information with Min A verbal and visual cues. SLP Short Term Goal 4 (Week 1): Patient will demonstrate functional problem solving for basic and familiar tasks with Min A verbal cues. SLP Short Term Goal 5 (Week 1): Patient will utilize speech intelligibility strategies at the phrase level with Min A verbal cues to achieve ~90% intelligibility.  Skilled Therapeutic Interventions:  #1 Skilled ST services focused on cognitive skills. SLP facilitated problem solving skills utilizing novel PEG design task, pt required min A verbal cues for basic problem solving and max A verbal cues for semi-complex problem solving design. Pt demonstrated ability to plan coordinates, however demonstrated difficulty recognizing and correct errors. Pt demonstrated recall of yesterday's ST events and three rules from card task with supervision A verbal cues. Pt demonstrated speech intelligibility with 90% intelligibility with mod I use of strategies. Pt was left in room with call bell within reach and chair alarm set. ST recommends to continue skilled ST services.   #2  Skilled ST services focused on cognitive skills. Pt required min A verbal cues to recall earlier ST events, pt supports baseline memory deficits from previous CVA. SLP facilitated basic problem solving and error awareness in  familiar PEG design task, pt required supervision A verbal cues. Pt demonstrated sustained attention to task in 20 minute intervals. Pt was left in room with call bell within reach and chair alarm set. ST recommends to continue skilled ST services.      Pain Pain Assessment Pain Scale: 0-10 Pain Score: 6  Pain Type: Acute pain Pain Location: Head  Therapy/Group: Individual Therapy  Kona Lover  [facility] 01/04/2019, 7:41 AM

## 2019-01-04 NOTE — Progress Notes (Signed)
Social Work Patient ID: Carolynn Comment, male   DOB: [birth year], 53 y.o.   MRN: [id number]  Have reviewed team conference with pt and father (via phone).  Both aware and agreeable with targeted d/c date of 9/24 and supervision/ set up w/c level goals.  Have scheduled for father to be here on Friday to go through therapies to start getting a better understanding of what assist will look like at home and begin addressing any concerns.  I will follow up with pt and father on Friday as well.  Amilliana Hayworth, LCSW

## 2019-01-04 NOTE — Patient Care Conference (Cosign Needed)
Inpatient RehabilitationTeam Conference and Plan of Care Update Date: 01/04/2019   Time: 9:35 AM    Patient Name: Carlos BickersGregory Ferguson      Medical Record Number: [id number]  Date of Birth: 1965-11-28 Sex: Male         Room/Bed: 4M10C/4M10C-01 Payor Info: Payor: MEDICARE / Plan: MEDICARE PART A AND B / Product Type: *No Product type* /    Admitting Diagnosis: 3. SCI Team  CVA no paresis, ICH in cerebellum; 22-24days  Admit Date/Time:  12/28/2018  1:27 PM Admission Comments: No comment available   Primary Diagnosis:  Intracranial hemorrhage, cerebellar (HCC) Principal Problem: Intracranial hemorrhage, cerebellar [facility](HCC)  Patient Active Problem List   Diagnosis Date Noted  . Diarrhea   . Labile blood pressure   . Dysphagia, post-stroke   . 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 Neurogenic bowel   . Neurogenic bladder, flaccid   . Essential hypertension 12/28/2018  . Obesity 12/28/2018  . Spina bifida (HCC) 12/28/2018  . Urinary retention 12/28/2018  . Seizures (HCC) 12/28/2018  . Hypokalemia 12/28/2018  . Intracranial hemorrhage, cerebellar (HCC) 12/28/2018  . Cerebellar bleed (HCC)   . Leukocytosis   . Cytotoxic brain edema (HCC) 12/27/2018  . ICH (intracerebral hemorrhage) (HCC) L cerebellar, etiology ? HTN 12/25/2018    Expected Discharge Date: Expected Discharge Date: 01/19/19  Team Members Present: Physician leading conference: Dr. Claudette LawsAndrew Kirsteins Social Worker Present: Amada JupiterLucy Dianelly Ferran, LCSW Nurse Present: Jesusita OkaAkivia Warren, LPN PT Present: Peter Congoaylor Turkalo, PT OT Present: Amy Rounds, OT SLP Present: Colin BentonMadison Cratch, SLP PPS Coordinator present : Fae PippinMelissa Bowie, SLP     Current Status/Progress Goal Weekly Team Focus  Medical   Pt still complaining of headaches- is interfering with therapy- wants tylenol prn; bowel incontinence- due to spina bifida- con't bowel care; barrier cream  decrease headaches- asked him to ask for tylenol; skin care  on buttocks  headaches/bowel incontinence   Bowel/Bladder   Pt is incontinent of B/B. Pt self caths at home, reusing the same cath multiple times; requeasting to be cathed here q3-4 hours even with <350 ml. Pt has had incontinent diarrhea (clay/light brown color, sticky) since 9/4, going 3-4x each day. Stool is foul smelling.LBM 9/9- the excessive bowel movements are irritating the numerous hemorrhoids cause them to bleed and be very tender to touch, as well as causing skin breakdown on the bottom around the rectum.  Educate pt on not reusing cath kits to prevent infection, regain continence of bowel  q4h in/out cath, offer q2h toileting   Swallow/Nutrition/ Hydration   Dys 2 and thin via cup, Mod A  Supervision A  swallow strategies and then dys 3 trials   ADL's   Mod A slide board transfers, Supervision UB bathing/dressing, mod-max A LB bathing/dressing from bed level, total A toileting bed level  Min A overall  ADL re-training, functional transfers, neuro re-ed, functional activity tolerance   Mobility   mod A bed mobility, min A SB transfer, min A w/c mobility, max A sit to stand to RW  min A overall, mod I once up in w/c  OOB tolerance, transfers, standing   Communication   Min-Supervision A  Supervision A  carryover of speech intelligibility strategies   Safety/Cognition/ Behavioral Observations  Mod-Min A  Supervision A  attention, problem soving basic/semi-complex, recall   Pain   pt c/o of 6/10 headache, & c/o lower back at times, c/o of recturm/buttocks pain  pain level will be <3  Assess pain Qshift/ PRN   Skin   pt has significant discoloration to the buttocks. the inner part of the buttocks has skin break down, that has minor bleeding when wiping the pt after pt has BM. breakdown is tender to touch  Pt will refrain from further skin breakdown. and free of infection.  Assess skin Qshift/ PRN    Rehab Goals Patient on target to meet rehab goals: Yes *See Care Plan and progress  notes for long and short-term goals.     Barriers to Discharge  Current Status/Progress Possible Resolutions Date Resolved   Physician    Medical stability;Incontinence;Neurogenic Bowel & Bladder;Wound Care;Behavior;Medication compliance  n/a  min to mod assist for ADLs/mobility- with w/c  improved continence?      Nursing                  PT  Decreased caregiver support;Medical stability;Home environment access/layout                 OT                  SLP                SW                Discharge Planning/Teaching Needs:  Plan at this point is for pt to return home with elderly father, however, concerns that his assistance needs may be too much for father to manage.  Will ask father to attend therapy sessions this week to begin observation and discussion with team.  Will plan to start this week.   Team Discussion:  Pt continues to work on self-cath with new deficits;  Loose bowels;  Min/ mod assist with sl. Board tfs.  Team hopeful may be able to reach a supervision w/c level for home but anticipate father will need to provide set up assistance.  SW to follow up with father.  Revisions to Treatment Plan:  NA    Continued Need for Acute Rehabilitation Level of Care: The patient requires daily medical management by a physician with specialized training in physical medicine and rehabilitation for the following conditions: Daily direction of a multidisciplinary physical rehabilitation program to ensure safe treatment while eliciting the highest outcome that is of practical value to the patient.: Yes Daily medical management of patient stability for increased activity during participation in an intensive rehabilitation regime.: Yes Daily analysis of laboratory values and/or radiology reports with any subsequent need for medication adjustment of medical intervention for : Wound care problems;Blood pressure problems;Neurological problems;Other;Urological problems   I attest that I was  present, lead the team conference, and concur with the assessment and plan of the team.   Lennart Pall 01/05/2019, 10:27 AM   Team conference was held via web/ teleconference due to [place] - 19

## 2019-01-04 NOTE — Progress Notes (Signed)
[place] PHYSICAL MEDICINE & REHABILITATION PROGRESS NOTE   Subjective/Complaints: Patient reports Headaches come and go- mainly entire head, however hurt most behind ears B/L- is also sound sensitive, not light sensitive or nauseated. Tylenol helps dramatically, so doesn't want to try any other medicine right now.  ROS: + Diarrhea- has improved.  Denies CP, shortness of breath, nausea, vomiting.  Objective:   No results found. Recent Labs    01/04/19 0521  WBC 7.5  HGB 16.5  HCT 47.4  PLT 288   Recent Labs    01/04/19 0521  NA 140  K 4.4  CL 105  CO2 25  GLUCOSE 94  BUN 18  CREATININE 0.80  CALCIUM 9.3    Intake/Output Summary (Last 24 hours) at 01/04/2019 1537 Last data filed at 01/04/2019 1506 Gross per 24 hour  Intake 240 ml  Output 1450 ml  Net -1210 ml     Physical Exam: Vital Signs Blood pressure 125/73, pulse 65, temperature 98 F (36.7 C), temperature source Oral, resp. rate 18, height 4\' 9"  (1.448 m), weight 63.3 kg, SpO2 100 %.   Physical Exam  Constitutional: No distress . Vital signs reviewed. Awake, alert, appropriate, sitting up in bed, PT in room, slightly dysarthric as normal, c/o HA- not TTP over actual head HENT: Normocephalic.  Atraumatic. Eyes: Abnormal extraocular motions.  No discharge. Cardiovascular: RRR Respiratory: CTA B/L GI: Non-distended. Soft, NT Skin: Warm and dry.  Intact. Psych: Normal mood.  Normal behavior. 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   Skin: Sacral/buttock wound not examined today Psychiatric: Flat.   Assessment/Plan: 1. Functional deficits secondary to cerebellar hematoma/severe hydrocephalus- due to hypertensive crisis - with a hx of Chiari  malformation and VP shunt as well as spina bifida which require 3+ hours per day of interdisciplinary therapy in a comprehensive inpatient rehab setting.  Physiatrist is providing close team supervision and 24 hour management of active medical problems listed below.  Physiatrist and rehab team continue to assess barriers to discharge/monitor patient progress toward functional and medical goals  Care Tool:  Bathing    Body parts bathed by patient: Right arm, Left arm, Chest, Abdomen, Right upper leg, Left upper leg, Right lower leg, Left lower leg, Face, Front perineal area   Body parts bathed by helper: Buttocks     Bathing assist Assist Level: Contact Guard/Touching assist     Upper Body Dressing/Undressing Upper body dressing   What is the patient wearing?: Pull over shirt    Upper body assist Assist Level: Supervision/Verbal cueing    Lower Body Dressing/Undressing Lower body dressing      What is the patient wearing?: Incontinence brief, Pants     Lower body assist Assist for lower body dressing: Moderate Assistance - Patient 50 - 74%     Toileting Toileting    Toileting assist Assist for toileting: Total Assistance - Patient < 25%     Transfers Chair/bed transfer  Transfers assist  Chair/bed transfer activity did not occur: Safety/medical concerns  Chair/bed transfer assist level: Minimal Assistance - Patient > 75%     Locomotion Ambulation   Ambulation assist   Ambulation activity did not occur: Safety/medical concerns          Walk 10 feet activity   Assist  Walk 10 feet activity did  not occur: Safety/medical concerns        Walk 50 feet activity   Assist Walk 50 feet with 2 turns activity did not occur: Safety/medical concerns         Walk 150 feet activity   Assist Walk 150 feet activity did not occur: Safety/medical concerns         Walk 10 feet on uneven surface  activity   Assist Walk 10 feet on uneven surfaces  activity did not occur: Safety/medical concerns         Wheelchair     Assist Will patient use wheelchair at discharge?: Yes Type of Wheelchair: Manual Wheelchair activity did not occur: Safety/medical concerns  Wheelchair assist level: Minimal Assistance - Patient > 75% Max wheelchair distance: 100'    Wheelchair 50 feet with 2 turns activity    Assist    Wheelchair 50 feet with 2 turns activity did not occur: Safety/medical concerns   Assist Level: Minimal Assistance - Patient > 75%   Wheelchair 150 feet activity     Assist  Wheelchair 150 feet activity did not occur: Safety/medical concerns   Assist Level: Minimal Assistance - Patient > 75%   Blood pressure 125/73, pulse 65, temperature 98 F (36.7 C), temperature source Oral, resp. rate 18, height 4\' 9"  (1.448 m), weight 63.3 kg, SpO2 100 %.   Medical Problem List and Plan: 1.  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  Antithrombotics: -DVT/anticoagulation:  Mechanical: Foot pump Bilateral lower extremities             -antiplatelet therapy: N/A due to bleed 3. Headaches/Pain Management: Added Ultram prn.  4. Mood: LCSW to follow for evaluation and support.              -antipsychotic agents: N/A 5. Neuropsych: This patient is fully capable of making decisions on his own behalf. 6. Skin/Wound Care: Routine pressure relief measures.  7. Fluids/Electrolytes/Nutrition: Monitor I/Os. Tolerating current diet.  8. Headaches: Persistent but appears to be improving.  Depakote added.  9/9- tylenol is helpful- doesn't want to try other meds currently.  9. Leucocytosis with neurogenic bladder: Monitor for signs of infection.    WBC 11.2 on 9/4, improving  Continue to monitor 10. Hypokalemia: Continue daily supplement and adjust as needed.  CMP ordered for tomorrow a.m.  Potassium 4.3 on 9/4 11. HTN: Monitor BP tid--continue  atenolol              Monitor with increased mobility  Slightly labile 9/7 12. Seizure disorder: Continue Keppra 1500 mg bid.  Monitor reoccurrence. 15. Neurogenic bowel- pt goes 1-2x/week- requires manual disimpaction- will write for bowel program to be done 2x/week to disimpact bowels.'   KUB ordered 16. Attention/fatigue trial Provigil since doesn't raise BP as much 100 mg daily  17. Vertigo/dizziness- don't want to add Meclizine to an already sedated gentleman- asked PT to give him rest breaks and will monitor for now.  18. R hip dysplasia- was due to go to [facility] for surgery-on hold at present 19.  Post stroke dysphagia: D3 thins, advance diet as tolerated  20. Dispo- team conference today- d/c date 01/19/2019   LOS: 7 days A FACE TO FACE EVALUATION WAS PERFORMED  Carlos Ferguson 01/04/2019, 3:37 PM

## 2019-01-04 NOTE — Progress Notes (Signed)
Physical Therapy Session Note  Patient Details  Name: Carlos Ferguson MRN: [id number] Date of Birth: 29-Apr-1965  Today's Date: 01/04/2019 PT Individual Time: 8638-1771; 1440-1455 PT Individual Time Calculation (min): 30 min and 15 min  Short Term Goals: Week 1:  PT Short Term Goal 1 (Week 1): Pt will maintain sitting balance EOB with mod A PT Short Term Goal 2 (Week 1): Pt will complete least restrictive transfer with assist x 1 consistently PT Short Term Goal 3 (Week 1): Pt will initiate w/c mobility  Skilled Therapeutic Interventions/Progress Updates:   Session 1:  Pt received sidelying in bed asleep, arousable and agreeable to PT session. Pt's lunch sitting next to him, he reports not knowing it had arrived. Pt has also had incontinence of bowel. Rolling L/R with min A and use of bedrails for dependent pericare and brief change. Supine to sit with mod A with HOB elevated. Slide board transfer bed to w/c with 4" step under BLE with min A. Pt insists that LLE brace needs to be locked in extension for transfer. Education that pt would benefit from knee unlocked in order to allow BLE contact on step for transfer. Pt remains insistent on leaving brace locked in extension for transfer. Pt has near fall forwards off of slide board during transfer due to decreased LE support on step. Pt agreeable to have brace unlocked for future transfers. Pt left seated in w/c in room with quick release belt and chair alarm in place setup with his lunch.  Session 2: Pt received seated in w/c in room, requesting to have I/O cath performed. Informed RN who says pt is right at 4 hours and able to be cathed. Slide board transfer w/c to bed with min A. Sit to supine min A. Pt is dependent to doff shoes and LE braces. Pt left supine in bed in care of RN for I/O cath.  Therapy Documentation Precautions:  Precautions Precautions: Fall Restrictions Weight Bearing Restrictions: No    Therapy/Group: Individual  Therapy   Excell Seltzer, PT, DPT  01/04/2019, 2:15 PM

## 2019-01-05 ENCOUNTER — Inpatient Hospital Stay (HOSPITAL_COMMUNITY): Payer: Medicare Other | Admitting: Occupational Therapy

## 2019-01-05 ENCOUNTER — Inpatient Hospital Stay (HOSPITAL_COMMUNITY): Payer: Medicare Other | Admitting: Physical Therapy

## 2019-01-05 ENCOUNTER — Inpatient Hospital Stay (HOSPITAL_COMMUNITY): Payer: Medicare Other | Admitting: Speech Pathology

## 2019-01-05 MED ORDER — HYDROCODONE-ACETAMINOPHEN 5-325 MG PO TABS
1.0000 | ORAL_TABLET | Freq: Four times a day (QID) | ORAL | Status: DC | PRN
  Administered 2019-01-14: 1 via ORAL
  Filled 2019-01-05: qty 1

## 2019-01-05 MED ORDER — DIVALPROEX SODIUM ER 500 MG PO TB24
750.0000 mg | ORAL_TABLET | Freq: Every day | ORAL | Status: DC
  Administered 2019-01-06 – 2019-01-30 (×25): 750 mg via ORAL
  Filled 2019-01-05 (×27): qty 1

## 2019-01-05 NOTE — Progress Notes (Signed)
Physical Therapy Weekly Progress Note  Patient Details  Name: Carlos Ferguson MRN: [id number] Date of Birth: 1965-11-22  Beginning of progress report period: December 29, 2018 End of progress report period: January 05, 2019  Today's Date: 01/05/2019 PT Individual Time: 1000-1100 PT Individual Time Calculation (min): 60 min   Patient has met 3 of 3 short term goals.  Pt has made good progress over the past week. He is at min A level for bed mobility overall, min A for slide board transfers to w/c, min A for w/c mobility, and max A for sit to stand to RW.  Patient continues to demonstrate the following deficits muscle weakness, decreased problem solving, decreased safety awareness, decreased memory and delayed processing and decreased sitting balance, decreased standing balance, decreased postural control and decreased balance strategies and therefore will continue to benefit from skilled PT intervention to increase functional independence with mobility.  Patient progressing toward long term goals..  Plan of care revisions: downgraded sit to stand goal to max A due to slow progress. Upgraded bed to chair and car transfer goal to Supervision due to good progress..  PT Short Term Goals Week 1:  PT Short Term Goal 1 (Week 1): Pt will maintain sitting balance EOB with mod A PT Short Term Goal 1 - Progress (Week 1): Met PT Short Term Goal 2 (Week 1): Pt will complete least restrictive transfer with assist x 1 consistently PT Short Term Goal 2 - Progress (Week 1): Met PT Short Term Goal 3 (Week 1): Pt will initiate w/c mobility PT Short Term Goal 3 - Progress (Week 1): Met Week 2:  PT Short Term Goal 1 (Week 2): Pt will perform least restrictive transfer with CGA consistently PT Short Term Goal 2 (Week 2): Pt will propel w/c x 150 ft with BUE and CGA PT Short Term Goal 3 (Week 2): Pt will tolerate sitting up in w/c x 2 hours  Skilled Therapeutic Interventions/Progress Updates:    Pt received  supine in bed, no complaints of pain. Pt reports not sleeping well last night due to a headache, reports not feeling strong enough for therapy this date. Encouraged pt to participate to continue to increase endurance and OOB tolerance. Pt agreeable to work on sitting balance but declines to perform any OOB transfers. Pt is dependent to don BLE braces while supine in bed. Pt is able to come to long-sitting with use of bedrails and HOB elevated with Supervision. Pt is CGA to maintain sitting balance in long-sitting due to decreased balance laterally. Pt is mod A to don pants while in long-sitting with assist to thread BLE. Pt is min A for rolling with use of bedrails to pull pants up over bottom. Supine to sitting EOB with close Supervision. Seated BP 147/85. Sitting balance EOB while performing ball toss with BUE. Pt fatigues quickly in sitting. Pt declines to perform transfer to w/c at end of session. Sit to supine Supervision. Pt left supine in bed with needs in reach, bed alarm in place at end of session.  Therapy Documentation Precautions:  Precautions Precautions: Fall Restrictions Weight Bearing Restrictions: No   Therapy/Group: Individual Therapy   Excell Seltzer, PT, DPT 01/05/2019, 12:33 PM

## 2019-01-05 NOTE — Progress Notes (Signed)
[facility] PHYSICAL MEDICINE & REHABILITATION PROGRESS NOTE   Subjective/Complaints: Patient reports HA is much worse- had a rough night due to constant HA- received Tramadol this AM ~ 7:15, hasn't had time to work yet- wondering if tylenol might- hasn't received since last night- Pt interested in increasing Depakote dose.   ROS:   Denies CP, shortness of breath, nausea, vomiting.  Objective:   No results found. Recent Labs    01/04/19 0521  WBC 7.5  HGB 16.5  HCT 47.4  PLT 288   Recent Labs    01/04/19 0521  NA 140  K 4.4  CL 105  CO2 25  GLUCOSE 94  BUN 18  CREATININE 0.80  CALCIUM 9.3    Intake/Output Summary (Last 24 hours) at 01/05/2019 0953 Last data filed at 01/05/2019 0731 Gross per 24 hour  Intake 440 ml  Output 1550 ml  Net -1110 ml     Physical Exam: Vital Signs Blood pressure 131/78, pulse 82, temperature 99 F (37.2 C), temperature source Oral, resp. rate 16, height 4\' 9"  (1.448 m), weight 65 kg, SpO2 96 %.   Physical Exam  Constitutional: No distress . Vital signs reviewed. Awake, alert, appropriate, sitting up in bed, SLP in room; c/o HA- NAD HENT: Normocephalic.  Atraumatic. Eyes: Abnormal extraocular motions.  No discharge. Cardiovascular: RRR Respiratory: CTA B/L GI: Non-distended. Soft, NT Skin: Warm and dry.  Intact. Psych: Normal mood.  Normal behavior. Musc: Lower extremities with healed incisions and deformities; no trigger points in scalenes, splenius capitus, upper traps or levators B/L- no TTP over occiput, even though HA is more in occiput area per pt. 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   Skin: Sacral/buttock wound not examined today Psychiatric: Flat.   Assessment/Plan: 1. 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  Physiatrist is providing close team supervision and 24 hour management of active medical problems listed below.  Physiatrist and rehab team continue to assess barriers to discharge/monitor patient progress toward functional and medical goals  Care Tool:  Bathing    Body parts bathed by patient: Right arm, Left arm, Chest, Abdomen, Right upper leg, Left upper leg, Right lower leg, Left lower leg, Face, Front perineal area   Body parts bathed by helper: Buttocks     Bathing assist Assist Level: Contact Guard/Touching assist     Upper Body Dressing/Undressing Upper body dressing   What is the patient wearing?: Pull over shirt    Upper body assist Assist Level: Supervision/Verbal cueing    Lower Body Dressing/Undressing Lower body dressing      What is the patient wearing?: Incontinence brief, Pants     Lower body assist Assist for lower body dressing: Moderate Assistance - Patient 50 - 74%     Toileting Toileting    Toileting assist Assist for toileting: Total Assistance - Patient < 25%     Transfers Chair/bed transfer  Transfers assist  Chair/bed transfer activity did not occur: Safety/medical concerns  Chair/bed transfer assist level: Minimal Assistance - Patient > 75%     Locomotion Ambulation   Ambulation assist   Ambulation activity did not occur: Safety/medical concerns  Walk 10 feet activity   Assist  Walk 10 feet activity did not occur: Safety/medical concerns        Walk 50 feet activity   Assist Walk 50 feet with 2 turns activity did not occur: Safety/medical concerns         Walk 150 feet activity   Assist Walk 150 feet activity did not occur: Safety/medical concerns         Walk 10  feet on uneven surface  activity   Assist Walk 10 feet on uneven surfaces activity did not occur: Safety/medical concerns         Wheelchair     Assist Will patient use wheelchair at discharge?: Yes Type of Wheelchair: Manual Wheelchair activity did not occur: Safety/medical concerns  Wheelchair assist level: Minimal Assistance - Patient > 75% Max wheelchair distance: 100'    Wheelchair 50 feet with 2 turns activity    Assist    Wheelchair 50 feet with 2 turns activity did not occur: Safety/medical concerns   Assist Level: Minimal Assistance - Patient > 75%   Wheelchair 150 feet activity     Assist  Wheelchair 150 feet activity did not occur: Safety/medical concerns   Assist Level: Minimal Assistance - Patient > 75%   Blood pressure 131/78, pulse 82, temperature 99 F (37.2 C), temperature source Oral, resp. rate 16, height 4\' 9"  (1.448 m), weight 65 kg, SpO2 96 %.   Medical Problem List and Plan: 1.  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  Antithrombotics: -DVT/anticoagulation:  Mechanical: Foot pump Bilateral lower extremities             -antiplatelet therapy: N/A due to bleed 3. Headaches/Pain Management: Added Ultram prn.   9/10- will increase Depakote ER to 750 mg daily and add norco for when tylneol and tramadol aren't effective. Will see what works- don't want triptans due to previous recent stroke/hemorrhage. 4. Mood: LCSW to follow for evaluation and support.              -antipsychotic agents: N/A 5. Neuropsych: This patient is fully capable of making decisions on his own behalf. 6. Skin/Wound Care: Routine pressure relief measures.  7. Fluids/Electrolytes/Nutrition: Monitor I/Os. Tolerating current diet.  8. Headaches: Persistent but appears to be improving.  Depakote added.  9/9- tylenol is helpful- doesn't want to try other meds currently.  9/10- HA  worse- increased depakote to 750 mg daily and added Norco prn. 9. Leucocytosis with neurogenic bladder: Monitor for signs of infection.    WBC 11.2 on 9/4, improving  Continue to monitor  9/10- WBC 7.5k- stable 10. Hypokalemia: Continue daily supplement and adjust as needed.  CMP ordered for tomorrow a.m.  Potassium 4.3 on 9/4  9/10- K+ 4.4 and stable 11. HTN: Monitor BP tid--continue atenolol              Monitor with increased mobility  Slightly labile 9/7 12. Seizure disorder: Continue Keppra 1500 mg bid.  Monitor reoccurrence. 15. Neurogenic bowel- pt goes 1-2x/week- requires manual disimpaction- will write for bowel program to be done 2x/week to disimpact bowels.'   KUB ordered  9/10- KUB looked OK; no bowel obstruction; no constipation  16. Attention/fatigue trial Provigil since doesn't raise BP as much 100 mg daily  17. Vertigo/dizziness- don't want to add Meclizine to an already sedated gentleman- asked PT to give him rest breaks and will monitor for now.  18. R hip  dysplasia- was due to go to [facility] for surgery-on hold at present 19.  Post stroke dysphagia: D3 thins, advance diet as tolerated  20. Dispo- team conference today- d/c date 01/19/2019   LOS: 8 days A FACE TO FACE EVALUATION WAS PERFORMED  Carlos Ferguson 01/05/2019, 9:53 AM

## 2019-01-05 NOTE — Plan of Care (Signed)
Downgraded sit to stand goal to max A due to slow progress. Upgraded bed to chair transfer and car transfer goal to Supervision due to progress.

## 2019-01-05 NOTE — Progress Notes (Signed)
Speech Language Pathology Weekly Progress and Session Note  Patient Details  Name: Carlos Ferguson MRN: [id number] Date of Birth: [birth year]  Beginning of progress report period: December 29, 2018 End of progress report period: January 05, 2019  Today's Date: 01/05/2019 SLP Individual Time: 0730-0830 SLP Individual Time Calculation (min): 60 min  Short Term Goals: Week 1: SLP Short Term Goal 1 (Week 1): Patient will consume current diet with minimal overt s/s of aspiration and Mod A verbal cues for use of swallowing compensatory strategies. SLP Short Term Goal 1 - Progress (Week 1): Met SLP Short Term Goal 2 (Week 1): Patient will demonstrate sustained attention to functional tasks for ~15 minutes with Min A verbal cues for redirection. SLP Short Term Goal 2 - Progress (Week 1): Met SLP Short Term Goal 3 (Week 1): Patient will recall new, daily information with Min A verbal and visual cues. SLP Short Term Goal 3 - Progress (Week 1): Not met SLP Short Term Goal 4 (Week 1): Patient will demonstrate functional problem solving for basic and familiar tasks with Min A verbal cues. SLP Short Term Goal 4 - Progress (Week 1): Met SLP Short Term Goal 5 (Week 1): Patient will utilize speech intelligibility strategies at the phrase level with Min A verbal cues to achieve ~90% intelligibility. SLP Short Term Goal 5 - Progress (Week 1): Met    New Short Term Goals: Week 2: SLP Short Term Goal 1 (Week 2): Pt will consume trials of dysphagia 3 with minimal overt s/s of aspiration given supervision cues for use of compensatory swallow strategies over 3 sessions prior to upgrade. SLP Short Term Goal 2 (Week 2): Pt will demonstrate selective attention for 15 minutes with Min A cues. SLP Short Term Goal 3 (Week 2): Pt will complete semi-complex problem solving tasks with Mod A cues. SLP Short Term Goal 4 (Week 2): Pt will utilize speech intelligibility strategies with supervision cues to achieve > 90%  speech intelligibility at the conversation level. SLP Short Term Goal 5 (Week 2): Given Mod A cues, pt will demonstrate use of 1 compensatory memory strategy to recall basic daily information in 5 out of 10 opportunities.  Weekly Progress Updates:  Pt has made good progress in skilled ST sessions and as a result, pt has met 4 of 5 STGs. Pt has made progress in the areas consumption of dysphagia 2 diet, sustained attention to task, functional problem solving for basic tasks and improved speech intelligibility at the sentence level. Pt states that he had baseline deficits in memory and this might explain why pt has met the STG for recall. Skilled ST continues to indicated to target semi-complex problem solving, selective attention to task, speech intelligibility at the conversation level, safely advancing diet and utilization of compensatory memory strategies. Pt continues to demonstrate improved functional independence and anticipate that pt will continue to progress.      Intensity: Minumum of 1-2 x/day, 30 to 90 minutes Frequency: 3 to 5 out of 7 days Duration/Length of Stay: 2.5-3 weeks Treatment/Interventions: Cognitive remediation/compensation;Internal/external aids;Speech/Language facilitation;Cueing hierarchy;Environmental controls;Dysphagia/aspiration precaution training;Patient/family education;Functional tasks;Therapeutic Activities   Daily Session  Skilled Therapeutic Interventions:   Skilled treatment session focused on dysphagia and cognition goals. SLP recieved pt in bed with pt reporting that he had been up most of the night iwth a headache. SLP provided skilled observation of pt consuming dypshagia 2 breakfast with thin liquids via cup. Pt with throat clear x 1 when consuming thin liquids. Pt's NT reports that pt  has been symptom free over last 2 days. Pt appears appropriate for set-up and intermittent supervision during meals. Signage updated. SLP further facilitated session by  attempting to administer Cognistat. However session was interrupted and unable to complete. Pt reports difficulty remembering information prior to this admission. Pt expresses desire to learn compensatory memory strategies to utilize in effort to increase his functional independence. Although pt's speech is largely intelligible, his communicative attempts are vague and lack important information. Additionally, pt makes off-topic perseverative comments. Pt left upright in bed, bed alarm on and all needs within reach. Continue per current plan of care.      General    Pain    Therapy/Group: Individual Therapy  Dannica Bickham 01/05/2019, 10:06 AM

## 2019-01-05 NOTE — Progress Notes (Signed)
Occupational Therapy Weekly Progress Note  Patient Details  Name: Carlos Ferguson MRN: [id number] Date of Birth: 1965/06/17  Beginning of progress report period: December 29, 2018 End of progress report period: January 05, 2019  Today's Date: 01/05/2019 OT Individual Time: 1300-1355 OT Individual Time Calculation (min): 55 min    Patient has met 3 of 3 short term goals.  Pt is making steady progress towards OT goals. He is much more alert with improved endurance since evaluation. He is completing basic sliding board transfers with min A, assist for placing board and set-up of all equipment and VCs for technique.  He is completing bathing/dressing routine from bed level in long sitting with min A overall, assist required to pull up pants via rolling and total A to don B AFOs and footwear.  Pt was self-cathing PTA, however, currently requires max A 2/2 cognitive deficits and pt demonstrating visual deficits, however, difficult to formally assess due to communication impairments. He does demonstrate vertical nystagmus with mobility and head turns.   Patient continues to demonstrate the following deficits: muscle weakness, decreased cardiorespiratoy endurance, central origin and decreased sitting balance, decreased standing balance, decreased postural control and decreased balance strategies.  and therefore will continue to benefit from skilled OT intervention to enhance overall performance with BADL and Reduce care partner burden.  Patient progressing toward long term goals..  Continue plan of care.  OT Short Term Goals Week 1:  OT Short Term Goal 1 (Week 1): Pt will transfer with mod A +1 using LRAD in order to reduce caregiver burden OT Short Term Goal 1 - Progress (Week 1): Met OT Short Term Goal 2 (Week 1): Pt will complete LB dressing from bed level with min A OT Short Term Goal 2 - Progress (Week 1): Met OT Short Term Goal 3 (Week 1): Pt will tolerate sitting EOB/EOM with min A for 10  minutes during functional task with supervision to increase upright tolerance. OT Short Term Goal 3 - Progress (Week 1): Met Week 2:  OT Short Term Goal 1 (Week 2): Pt will direct caregiver in set-up of sliding board transfer with no more than 2 questioning cues OT Short Term Goal 2 (Week 2): Pt will don pants from bed level with set-up/supervision using hospital bed functions. OT Short Term Goal 3 (Week 2): Pt will orient and don shirt with set-up assist  Skilled Therapeutic Interventions/Progress Updates:    Pt seen for OT session focusing on functional mobility, ADL re-training and self-cathing. Pt sitting up in bed upon arrival, denied pain, however, reports fatigue from previous tx sessions but willing to participate as able.  Transfers: Supine>sitting EOB with guarding assist using hospital bed functions. Min A sliding board transfer EOB<> w /c with assist to place and stabilize board and VCs for anterior weightshift and hand placement during transfer. When returning to bed at end of session, had pt direct therapist in set-up of w/c in prep for transfer, max questioning cues overall required for proper set-up.   ADL re-training: Grooming tasks completed mod I from w/c level at sink. Education/discussion regarding self-cathing PTA. Pt reports he would transfer onto toilet to self cath. Won't have access to home bathroom from w/c level at d/c. Discussed emptying into [facility] urinal.  Pt provided with self-cath materials, self cathed from w/c level with set-up to manage all items and min A overall. Pt reports he is unable to see catheter insertion site and does not have sensation in penis to know when he  has correctly inserted cathetr, he reports this was the case PTA.   Pt returned to supine at end of sess,  All needs in reach.   Therapy Documentation Precautions:  Precautions Precautions: Fall Restrictions Weight Bearing Restrictions: No   Therapy/Group: Individual Therapy  Jadea Shiffer  L 01/05/2019, 6:30 AM

## 2019-01-06 ENCOUNTER — Ambulatory Visit (HOSPITAL_COMMUNITY): Payer: Medicare Other | Admitting: Physical Therapy

## 2019-01-06 ENCOUNTER — Encounter (HOSPITAL_COMMUNITY): Payer: Medicare Other | Admitting: Occupational Therapy

## 2019-01-06 ENCOUNTER — Inpatient Hospital Stay (HOSPITAL_COMMUNITY): Payer: Medicare Other | Admitting: Speech Pathology

## 2019-01-06 ENCOUNTER — Inpatient Hospital Stay (HOSPITAL_COMMUNITY): Payer: Medicare Other

## 2019-01-06 MED ORDER — LIDOCAINE 5 % EX PTCH
2.0000 | MEDICATED_PATCH | CUTANEOUS | Status: DC
  Administered 2019-01-06: 2 via TRANSDERMAL
  Filled 2019-01-06 (×3): qty 2

## 2019-01-06 NOTE — Progress Notes (Signed)
Occupational Therapy Session Note  Patient Details  Name: Carlos Ferguson MRN: [id number] Date of Birth: 28-Nov-1965  Today's Date: 01/06/2019 OT Individual Time: 1100-1200 OT Individual Time Calculation (min): 60 min    Short Term Goals: Week 2:  OT Short Term Goal 1 (Week 2): Pt will direct caregiver in set-up of sliding board transfer with no more than 2 questioning cues OT Short Term Goal 2 (Week 2): Pt will don pants from bed level with set-up/supervision using hospital bed functions. OT Short Term Goal 3 (Week 2): Pt will orient and don shirt with set-up assist  Skilled Therapeutic Interventions/Progress Updates:    Pt seen for OT Family education session with pt's father. Pt in supine upon arrival, voicing increased fatigued from previous tx sessions but willing to participate as able. Pt's father observed entire bathing/dressing session, however, with limited participation. Receptive to education throughout session but did not ask any follow up questions or provide hands on care.   ADL re-training: UB/LB bathing/dressing from bed level in long sitting. Pt transitioning from supine> long sitting with supervision using bed rails. Min A overall for LB bathing at bed level, returned to supine and rolled to complete pericare/buttock hygiene with assist for thoroughness. Min A overall for LB dressing including brief and pants with VCs for technique and rolling to pull pants up with min A. He was able to doff and don B prosthetics. Set-up UB bathing and min A for button down shirt.   Pt very fatigued this session requiring increased time and rest breaks throughout bed level ADLs. Pt wondering if medication related, RN made aware.  Pt left in supine at end of session and bed alarm on and all needs in reach, father present. Father will need cont family ed sessions if plan is to d/c home.   Therapy Documentation Precautions:  Precautions Precautions: Fall Restrictions Weight Bearing  Restrictions: No   Therapy/Group: Individual Therapy  Carlos Ferguson 01/06/2019, 6:58 AM

## 2019-01-06 NOTE — Progress Notes (Signed)
[place] PHYSICAL MEDICINE & REHABILITATION PROGRESS NOTE   Subjective/Complaints: Patient reports headache is better, however now feels more like he has a "crick in his neck" and neck muscles are tight.  Thinks he's on a "bland" diet- wants to change- unfortunately on a D2 diet- cannot change- only SLP can.  Bottom is also better- doesn't hurt as much- and nursing says it's healing well.   ROS:   Denies CP, shortness of breath, nausea, vomiting.  Objective:   No results found. Recent Labs    01/04/19 0521  WBC 7.5  HGB 16.5  HCT 47.4  PLT 288   Recent Labs    01/04/19 0521  NA 140  K 4.4  CL 105  CO2 25  GLUCOSE 94  BUN 18  CREATININE 0.80  CALCIUM 9.3    Intake/Output Summary (Last 24 hours) at 01/06/2019 1355 Last data filed at 01/06/2019 1300 Gross per 24 hour  Intake 840 ml  Output 500 ml  Net 340 ml     Physical Exam: Vital Signs Blood pressure 135/82, pulse 87, temperature 98.3 F (36.8 C), temperature source Oral, resp. rate 17, height 4\' 9"  (1.448 m), weight 65 kg, SpO2 98 %.   Physical Exam  Constitutional: No distress . Vital signs reviewed. Awake, alert, appropriate, sitting up in bed, finished 1/2 of breakfast, NAD; slightly dysarthric, but chronic HENT: Normocephalic.  Atraumatic. Eyes: Abnormal extraocular motions.  No discharge. Cardiovascular: RRR Respiratory: CTA B/L GI: Non-distended. Soft, NT Skin: Warm and dry.  Intact. Psych: Normal mood.  Normal behavior. 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   Skin: Sacral/buttock wound not examined today Psychiatric:  Flat.   Assessment/Plan: 1. 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  Physiatrist is providing close team supervision and 24 hour management of active medical problems listed below.  Physiatrist and rehab team continue to assess barriers to discharge/monitor patient progress toward functional and medical goals  Care Tool:  Bathing    Body parts bathed by patient: Right arm, Left arm, Chest, Abdomen, Right upper leg, Left upper leg, Right lower leg, Left lower leg, Face, Front perineal area   Body parts bathed by helper: Buttocks     Bathing assist Assist Level: Contact Guard/Touching assist     Upper Body Dressing/Undressing Upper body dressing   What is the patient wearing?: Button up shirt    Upper body assist Assist Level: Moderate Assistance - Patient 50 - 74%    Lower Body Dressing/Undressing Lower body dressing      What is the patient wearing?: Incontinence brief, Pants     Lower body assist Assist for lower body dressing: Moderate Assistance - Patient 50 - 74%     Toileting Toileting    Toileting assist Assist for toileting: Minimal Assistance - Patient > 75%     Transfers Chair/bed transfer  Transfers assist  Chair/bed transfer activity did not occur: Safety/medical concerns  Chair/bed transfer assist level: Contact Guard/Touching assist     Locomotion Ambulation  Ambulation assist   Ambulation activity did not occur: Safety/medical concerns          Walk 10 feet activity   Assist  Walk 10 feet activity did not occur: Safety/medical concerns        Walk 50 feet activity   Assist Walk 50 feet with 2 turns activity did not occur: Safety/medical concerns         Walk 150 feet  activity   Assist Walk 150 feet activity did not occur: Safety/medical concerns         Walk 10 feet on uneven surface  activity   Assist Walk 10 feet on uneven surfaces activity did not occur: Safety/medical concerns         Wheelchair     Assist Will patient use wheelchair at discharge?: Yes Type of Wheelchair: Manual Wheelchair activity did not occur: Safety/medical concerns  Wheelchair assist level: Supervision/Verbal cueing Max wheelchair distance: 150'    Wheelchair 50 feet with 2 turns activity    Assist    Wheelchair 50 feet with 2 turns activity did not occur: Safety/medical concerns   Assist Level: Supervision/Verbal cueing   Wheelchair 150 feet activity     Assist  Wheelchair 150 feet activity did not occur: Safety/medical concerns   Assist Level: Supervision/Verbal cueing   Blood pressure 135/82, pulse 87, temperature 98.3 F (36.8 C), temperature source Oral, resp. rate 17, height 4\' 9"  (1.448 m), weight 65 kg, SpO2 98 %.   Medical Problem List and Plan: 1.  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  Antithrombotics: -DVT/anticoagulation:  Mechanical: Foot pump Bilateral lower extremities             -antiplatelet therapy: N/A due to bleed 3. Headaches/Pain Management: Added Ultram prn.   9/10- will increase Depakote ER to 750 mg daily and add norco for when tylneol and tramadol aren't effective. Will see what works- don't want triptans due to previous recent stroke/hemorrhage.  9/11- will add 2 lidoderm patches 8pm to 8am daily for neck tightness 4. Mood: LCSW to follow for evaluation and support.              -antipsychotic agents: N/A 5. Neuropsych: This patient is fully capable of making decisions on his own behalf. 6. Skin/Wound Care: Routine pressure relief measures.  7. Fluids/Electrolytes/Nutrition: Monitor I/Os. Tolerating current diet.  8.  Headaches: Persistent but appears to be improving.  Depakote added.  9/9- tylenol is helpful- doesn't want to try other meds currently.  9/10- HA worse- increased depakote to 750 mg daily and added Norco prn.  9/11- better, but added lidoderm for neck tightness 9. Leucocytosis with neurogenic bladder: Monitor for signs of infection.    WBC 11.2 on 9/4, improving  Continue to monitor  9/10- WBC 7.5k- stable 10. Hypokalemia: Continue daily supplement and adjust as needed.  CMP ordered for tomorrow a.m.  Potassium 4.3 on 9/4  9/10- K+ 4.4 and stable 11. HTN: Monitor BP tid--continue atenolol              Monitor with increased mobility  Slightly labile 9/7 12. Seizure disorder: Continue Keppra 1500 mg bid.  Monitor reoccurrence. 15. 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  16. Attention/fatigue trial Provigil  since doesn't raise BP as much 100 mg daily  17. Vertigo/dizziness- don't want to add Meclizine to an already sedated gentleman- asked PT to give him rest breaks and will monitor for now.  18. R hip dysplasia- was due to go to [facility] for surgery-on hold at present 19.  Post stroke dysphagia: D3 thins, advance diet as tolerated  20. Dispo- team conference today- d/c date 01/19/2019  9/11- going to SNF- just determined today   LOS: 9 days A FACE TO FACE EVALUATION WAS PERFORMED  Tika Hannis 01/06/2019, 1:55 PM

## 2019-01-06 NOTE — Progress Notes (Signed)
Social Work Patient ID: Carlos Ferguson Comment, male   DOB: [birth year], 53 y.o.   MRN: [id number]   Met with pt and father following family education sessions (father only observed).  Both expressing concerns about level of assist available from father at home.  Father reports that pt will d/c to his own home and father can "only check in on him... I can't help him at all...barely taking care of myself...".  Discussed need for assistance in the home for pt and, if not available, then other options are private duty (they decline due to cost) or SNF.  BOTH very much in agreement with SNF plan and already have a couple of facilities they would prefer.  Have alerted team to change in d/c plan.  Will begin SNF bed search process.  Theseus Birnie, LCSW

## 2019-01-06 NOTE — Progress Notes (Signed)
Physical Therapy Session Note  Patient Details  Name: Carlos Ferguson MRN: [id number] Date of Birth: 1965/12/03  Today's Date: 01/06/2019 PT Individual Time: 1000-1050 PT Individual Time Calculation (min): 50 min   Short Term Goals: Week 2:  PT Short Term Goal 1 (Week 2): Pt will perform least restrictive transfer with CGA consistently PT Short Term Goal 2 (Week 2): Pt will propel w/c x 150 ft with BUE and CGA PT Short Term Goal 3 (Week 2): Pt will tolerate sitting up in w/c x 2 hours  Skilled Therapeutic Interventions/Progress Updates:    Pt received supine in bed, agreeable to PT session. No complaints of pain, does report ongoing loose stools this AM. Pt's dad present to observe session and pt's current functional level to determine if he is able to provide assist for the pt at home. Supine to sit with HOB elevated and use of bedrails, Supervision. Pt is max A to don LE braces, min A to don pants in long-sitting with return to supine for pulling up over bottom. Pt is Supervision for rolling L/R with use of bedrails and min A to pull up pants. Supine to sit with Supervision from flat bed with use of bedrails. Pt is dependent to don shoes while seated EOB. Slide board transfer bed to w/c with CGA with 4" step under feet for support. Pt is at Supervision for w/c mobility x 150 ft with use of BUE. Slide board transfer w/c to bed with CGA, v/c for anterior weight shift during transfer. Sit to supine Supervision. Pt requires increased time between transfers due to onset of dizziness with position change that resolves with rest breaks. Pt's dad observed therapy session with no questions at end of session. Pt left supine in bed with needs in reach, bed alarm in place.  Therapy Documentation Precautions:  Precautions Precautions: Fall Restrictions Weight Bearing Restrictions: No    Therapy/Group: Individual Therapy   Excell Seltzer, PT, DPT  01/06/2019, 12:47 PM

## 2019-01-06 NOTE — Progress Notes (Signed)
Physical Therapy Session Note  Patient Details  Name: Carlos Ferguson MRN: [id number] Date of Birth: 11/06/1965  Today's Date: 01/06/2019 PT Individual Time: 0830-0900 PT Individual Time Calculation (min): 30 min   Short Term Goals: Week 2:  PT Short Term Goal 1 (Week 2): Pt will perform least restrictive transfer with CGA consistently PT Short Term Goal 2 (Week 2): Pt will propel w/c x 150 ft with BUE and CGA PT Short Term Goal 3 (Week 2): Pt will tolerate sitting up in w/c x 2 hours  Skilled Therapeutic Interventions/Progress Updates:   Pt declined OOB this morning but agreeable to work EOB to address balance retraining. Pt able to perform bed mobility supine <> sit with supervision using bedrails for support. Engaged in trunk control and balance retraining EOB without UE support with supervision to occassional min/CGA to catch balance or pt utilized 1 UE support while doing ball raises and rotations with 1kg medicine ball and lateral reaches to R and L while throwing horsehoes.Rest breaks need with BUE support in propped position between activities due to fatigue.  Therapy Documentation Precautions:  Precautions Precautions: Fall Restrictions Weight Bearing Restrictions: No Pain:   Denies pain.  Therapy/Group: Individual Therapy  Canary Brim Ivory Broad, PT, DPT, CBIS  01/06/2019, 9:31 AM

## 2019-01-06 NOTE — Progress Notes (Signed)
Speech Language Pathology Daily Session Note  Patient Details  Name: Carlos Ferguson MRN: [id number] Date of Birth: 01-30-1966  Today's Date: 01/06/2019 SLP Individual Time: 1304-1400 SLP Individual Time Calculation (min): 56 min  Short Term Goals: Week 2: SLP Short Term Goal 1 (Week 2): Pt will consume trials of dysphagia 3 with minimal overt s/s of aspiration given supervision cues for use of compensatory swallow strategies over 3 sessions prior to upgrade. SLP Short Term Goal 2 (Week 2): Pt will demonstrate selective attention for 15 minutes with Min A cues. SLP Short Term Goal 3 (Week 2): Pt will complete semi-complex problem solving tasks with Mod A cues. SLP Short Term Goal 4 (Week 2): Pt will utilize speech intelligibility strategies with supervision cues to achieve > 90% speech intelligibility at the conversation level. SLP Short Term Goal 5 (Week 2): Given Mod A cues, pt will demonstrate use of 1 compensatory memory strategy to recall basic daily information in 5 out of 10 opportunities.  Skilled Therapeutic Interventions:  Skilled treatment session focused on dysphagia and cognition goals. SLP recieved pt upright in bed with his father present. Pt eating lunch. SLP supplemented with additional items to make it a regular lunch trial (pt requesting regular food). Chart review indicated that pt's diet was downgraded d/t fatigue. However pt's has demonstrated good ability for alertness. Pt consumed without oral phase deficits and didn't present with any overt s/s of aspiration. Recommend upgrade to regular diet. SLP offered to answer any questions that pt's father had but he didn't present with any. Pt able to more effectively utilize speech intelligibility strategies at the conversation level to reach > 90% intelligibility and able to demonstrate selective attention for ~ 30 minutes. Pt left upright in bed, bed alarm on and all needs within reach. Continue per current plan of care.       Pain Pain Assessment Pain Scale: 0-10 Pain Score: 0-No pain  Therapy/Group: Individual Therapy  Mailee Klaas 01/06/2019, 2:08 PM

## 2019-01-07 ENCOUNTER — Inpatient Hospital Stay (HOSPITAL_COMMUNITY): Payer: Medicare Other | Admitting: Occupational Therapy

## 2019-01-07 NOTE — Progress Notes (Signed)
Occupational Therapy Session Note  Patient Details  Name: Carlos Ferguson MRN: [id number] Date of Birth: 21-Nov-1965  Today's Date: 01/07/2019 OT Individual Time: 1430-1440 OT Individual Time Calculation (min): 10 min  and Today's Date: 01/07/2019 OT Missed Time: 50 Minutes Missed Time Reason: Pain   Short Term Goals: Week 1:  OT Short Term Goal 1 (Week 1): Pt will transfer with mod A +1 using LRAD in order to reduce caregiver burden OT Short Term Goal 1 - Progress (Week 1): Met OT Short Term Goal 2 (Week 1): Pt will complete LB dressing from bed level with min A OT Short Term Goal 2 - Progress (Week 1): Met OT Short Term Goal 3 (Week 1): Pt will tolerate sitting EOB/EOM with min A for 10 minutes during functional task with supervision to increase upright tolerance. OT Short Term Goal 3 - Progress (Week 1): Met Week 2:  OT Short Term Goal 1 (Week 2): Pt will direct caregiver in set-up of sliding board transfer with no more than 2 questioning cues OT Short Term Goal 2 (Week 2): Pt will don pants from bed level with set-up/supervision using hospital bed functions. OT Short Term Goal 3 (Week 2): Pt will orient and don shirt with set-up assist  Skilled Therapeutic Interventions/Progress Updates:    Pt received in bed laying in prone.  Pt stated that he has had frequent bowels and the moisture on his skin in the incontinence brief combined with frequent cleansing has made him very sore and now he is in 7/10 pain.  Pt stated he cant tolerate being on his back or sitting up right now.  He requested to skip therapy today.    Reviewed his progress to date and what he wants to work on. Pt stated he is motivated but just is so uncomfortable and does not want to change positions.   Reviewed with pt the creams and salves in his room and pt stated nursing is using them. Also recommended that his dad bring in some gentle wet wipes.   Pt left resting in bed and RN aware of his pain.    Therapy  Documentation Precautions:  Precautions Precautions: Fall Restrictions Weight Bearing Restrictions: No General: General OT Amount of Missed Time: 50 Minutes   Pain: Pain Assessment Pain Score: 7  Pain Type: Acute pain Pain Location: Buttocks Pain Orientation: Mid Pain Descriptors / Indicators: Burning Pain Onset: Gradual Pain Intervention(s): RN made aware   Therapy/Group: Individual Therapy  [place] 01/07/2019, 3:11 PM

## 2019-01-07 NOTE — Progress Notes (Signed)
[place] PHYSICAL MEDICINE & REHABILITATION PROGRESS NOTE   Subjective/Complaints:  No issues overnite   ROS:   Denies CP, shortness of breath, nausea, vomiting.  Objective:   No results found. No results for input(s): WBC, HGB, HCT, PLT in the last 72 hours. No results for input(s): NA, K, CL, CO2, GLUCOSE, BUN, CREATININE, CALCIUM in the last 72 hours.  Intake/Output Summary (Last 24 hours) at 01/07/2019 0908 Last data filed at 01/07/2019 0825 Gross per 24 hour  Intake 750 ml  Output 1000 ml  Net -250 ml     Physical Exam: Vital Signs Blood pressure 130/79, pulse (!) 111, temperature 99.3 F (37.4 C), temperature source Oral, resp. rate 16, height 4\' 9"  (1.448 m), weight 62.1 kg, SpO2 99 %.   Physical Exam  Constitutional: No distress . Vital signs reviewed. Awake, alert, appropriate, sitting up in bed, finished 1/2 of breakfast, NAD; slightly dysarthric, but chronic HENT: Normocephalic.  Atraumatic. Eyes: Abnormal extraocular motions.  No discharge. Cardiovascular: RRR Respiratory: CTA B/L GI: Non-distended. Soft, NT Skin: Warm and dry.  Intact. Psych: Normal mood.  Normal behavior. 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   Skin: Sacral/buttock wound not examined today Psychiatric: Flat.   Assessment/Plan: 1. 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  Physiatrist is  providing close team supervision and 24 hour management of active medical problems listed below.  Physiatrist and rehab team continue to assess barriers to discharge/monitor patient progress toward functional and medical goals  Care Tool:  Bathing    Body parts bathed by patient: Right arm, Left arm, Chest, Abdomen, Right upper leg, Left upper leg, Right lower leg, Left lower leg, Face, Front perineal area   Body parts bathed by helper: Buttocks     Bathing assist Assist Level: Contact Guard/Touching assist     Upper Body Dressing/Undressing Upper body dressing   What is the patient wearing?: Button up shirt    Upper body assist Assist Level: Moderate Assistance - Patient 50 - 74%    Lower Body Dressing/Undressing Lower body dressing      What is the patient wearing?: Incontinence brief, Pants     Lower body assist Assist for lower body dressing: Moderate Assistance - Patient 50 - 74%     Toileting Toileting    Toileting assist Assist for toileting: Minimal Assistance - Patient > 75%     Transfers Chair/bed transfer  Transfers assist  Chair/bed transfer activity did not occur: Safety/medical concerns  Chair/bed transfer assist level: Contact Guard/Touching assist     Locomotion Ambulation   Ambulation assist   Ambulation activity did not occur: Safety/medical concerns          Walk 10 feet activity   Assist  Walk 10 feet activity did not occur: Safety/medical concerns        Walk 50 feet activity   Assist Walk 50 feet with 2 turns activity did not occur: Safety/medical concerns         Walk 150 feet activity  Assist Walk 150 feet activity did not occur: Safety/medical concerns         Walk 10 feet on uneven surface  activity   Assist Walk 10 feet on uneven surfaces activity did not occur: Safety/medical concerns         Wheelchair     Assist Will patient use wheelchair at discharge?: Yes Type of Wheelchair:  Manual Wheelchair activity did not occur: Safety/medical concerns  Wheelchair assist level: Supervision/Verbal cueing Max wheelchair distance: 150'    Wheelchair 50 feet with 2 turns activity    Assist    Wheelchair 50 feet with 2 turns activity did not occur: Safety/medical concerns   Assist Level: Supervision/Verbal cueing   Wheelchair 150 feet activity     Assist  Wheelchair 150 feet activity did not occur: Safety/medical concerns   Assist Level: Supervision/Verbal cueing   Blood pressure 130/79, pulse (!) 111, temperature 99.3 F (37.4 C), temperature source Oral, resp. rate 16, height 4\' 9"  (1.448 m), weight 62.1 kg, SpO2 99 %.   Medical Problem List and Plan: 1.  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  Antithrombotics: -DVT/anticoagulation:  Mechanical: Foot pump Bilateral lower extremities             -antiplatelet therapy: N/A due to bleed 3. Headaches/Pain Management: Added Ultram prn.   9/10- will increase Depakote ER to 750 mg daily and add norco for when tylneol and tramadol aren't effective. Will see what works- don't want triptans due to previous recent stroke/hemorrhage.  9/11- will add 2 lidoderm patches 8pm to 8am daily for neck tightness 4. Mood: LCSW to follow for evaluation and support.              -antipsychotic agents: N/A 5. Neuropsych: This patient is fully capable of making decisions on his own behalf. 6. Skin/Wound Care: Routine pressure relief measures.  7. Fluids/Electrolytes/Nutrition: Monitor I/Os. Tolerating current diet.  8. Headaches: Persistent but appears to be improving.  Depakote added.  9/9- tylenol is helpful- doesn't want to try other meds currently.  9/10- HA worse- increased depakote to 750 mg daily and added Norco prn.  9/11- better, but added lidoderm for neck tightness 9. Leucocytosis with neurogenic bladder: Monitor for signs of  infection.    WBC 11.2 on 9/4, improving  Continue to monitor  9/10- WBC 7.5k- stable 10. Hypokalemia: Continue daily supplement and adjust as needed.  CMP ordered for tomorrow a.m.  Potassium 4.3 on 9/4  9/10- K+ 4.4 and stable 11. HTN: Monitor BP tid--continue atenolol               Vitals:   01/07/19 0403 01/07/19 0858  BP: 139/90 130/79  Pulse: 79 (!) 111  Resp: 16   Temp: 99.3 F (37.4 C)   SpO2: 99%    12. Seizure disorder: Continue Keppra 1500 mg bid.  Monitor reoccurrence. 15. 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  16. Attention/fatigue trial Provigil since doesn't raise BP as much 100 mg daily  17. Vertigo/dizziness- don't want to add Meclizine to an already sedated gentleman- asked PT to give him rest breaks and will monitor for now.  18. R hip dysplasia- was due to go to [facility] for surgery-on hold at  present 19.  Post stroke dysphagia: D3 thins, advance diet as tolerated  20. Dispo- team conference today- d/c date 01/19/2019  9/11- going to SNF- just determined today   LOS: 10 days A FACE TO FACE EVALUATION WAS PERFORMED  Charlett Blake 01/07/2019, 9:08 AM

## 2019-01-08 NOTE — Progress Notes (Signed)
[place] PHYSICAL MEDICINE & REHABILITATION PROGRESS NOTE   Subjective/Complaints:  No new issues overnite   ROS:   Denies CP, shortness of breath, nausea, vomiting.  Objective:   No results found. No results for input(s): WBC, HGB, HCT, PLT in the last 72 hours. No results for input(s): NA, K, CL, CO2, GLUCOSE, BUN, CREATININE, CALCIUM in the last 72 hours.  Intake/Output Summary (Last 24 hours) at 01/08/2019 0838 Last data filed at 01/08/2019 0504 Gross per 24 hour  Intake 572 ml  Output 400 ml  Net 172 ml     Physical Exam: Vital Signs Blood pressure (!) 142/82, pulse 69, temperature 97.9 F (36.6 C), temperature source Oral, resp. rate 16, height 4\' 9"  (1.448 m), weight 62.1 kg, SpO2 99 %.   Physical Exam  Constitutional: No distress . Vital signs reviewed. Awake, alert, appropriate, sitting up in bed, finished 1/2 of breakfast, NAD; slightly dysarthric, but chronic HENT: Normocephalic.  Atraumatic. Eyes: Abnormal extraocular motions.  No discharge. Cardiovascular: RRR Respiratory: CTA B/L GI: Non-distended. Soft, NT Skin: Warm and dry.  Intact. Psych: Normal mood.  Normal behavior. 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   Sensation reduced in BLE  Skin: Sacral/buttock wound not examined today Psychiatric: Flat.   Assessment/Plan: 1. Functional deficits secondary to cerebellar hematoma/severe hydrocephalus- due to hypertensive crisis - with a hx of Chiari malformation and VP shunt as well as spina bifida which require 3+ hours per day of interdisciplinary therapy in a comprehensive inpatient rehab  setting.  Physiatrist is providing close team supervision and 24 hour management of active medical problems listed below.  Physiatrist and rehab team continue to assess barriers to discharge/monitor patient progress toward functional and medical goals  Care Tool:  Bathing    Body parts bathed by patient: Right arm, Left arm, Chest, Abdomen, Right upper leg, Left upper leg, Right lower leg, Left lower leg, Face, Front perineal area   Body parts bathed by helper: Buttocks     Bathing assist Assist Level: Contact Guard/Touching assist     Upper Body Dressing/Undressing Upper body dressing   What is the patient wearing?: Button up shirt    Upper body assist Assist Level: Moderate Assistance - Patient 50 - 74%    Lower Body Dressing/Undressing Lower body dressing      What is the patient wearing?: Incontinence brief, Pants     Lower body assist Assist for lower body dressing: Moderate Assistance - Patient 50 - 74%     Toileting Toileting    Toileting assist Assist for toileting: Minimal Assistance - Patient > 75%     Transfers Chair/bed transfer  Transfers assist  Chair/bed transfer activity did not occur: Safety/medical concerns  Chair/bed transfer assist level: Contact Guard/Touching assist     Locomotion Ambulation   Ambulation assist   Ambulation activity did not occur: Safety/medical concerns          Walk 10 feet activity   Assist  Walk 10 feet activity did not occur: Safety/medical concerns        Walk 50 feet activity   Assist Walk 50 feet with 2 turns activity did not occur: Safety/medical concerns  Walk 150 feet activity   Assist Walk 150 feet activity did not occur: Safety/medical concerns         Walk 10 feet on uneven surface  activity   Assist Walk 10 feet on uneven surfaces activity did not occur: Safety/medical concerns         Wheelchair     Assist Will patient use wheelchair at discharge?:  Yes Type of Wheelchair: Manual Wheelchair activity did not occur: Safety/medical concerns  Wheelchair assist level: Supervision/Verbal cueing Max wheelchair distance: 150'    Wheelchair 50 feet with 2 turns activity    Assist    Wheelchair 50 feet with 2 turns activity did not occur: Safety/medical concerns   Assist Level: Supervision/Verbal cueing   Wheelchair 150 feet activity     Assist  Wheelchair 150 feet activity did not occur: Safety/medical concerns   Assist Level: Supervision/Verbal cueing   Blood pressure (!) 142/82, pulse 69, temperature 97.9 F (36.6 C), temperature source Oral, resp. rate 16, height 4\' 9"  (1.448 m), weight 62.1 kg, SpO2 99 %.   Medical Problem List and Plan: 1.  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  Antithrombotics: -DVT/anticoagulation:  Mechanical: Foot pump Bilateral lower extremities             -antiplatelet therapy: N/A due to bleed 3. Headaches/Pain Management: Added Ultram prn.   9/10- will increase Depakote ER to 750 mg daily and add norco for when tylneol and tramadol aren't effective. Will see what works- don't want triptans due to previous recent stroke/hemorrhage.  9/11- will add 2 lidoderm patches 8pm to 8am daily for neck tightness 4. Mood: LCSW to follow for evaluation and support.              -antipsychotic agents: N/A 5. Neuropsych: This patient is fully capable of making decisions on his own behalf. 6. Skin/Wound Care: Routine pressure relief measures.  7. Fluids/Electrolytes/Nutrition: Monitor I/Os. Tolerating current diet.  8. Headaches: Persistent but appears to be improving.  Depakote added.  9/9- tylenol is helpful- doesn't want to try other meds currently.  9/10- HA worse- increased depakote to 750 mg daily and added Norco prn.  9/11- better, but added lidoderm for neck tightness 9. Leucocytosis with neurogenic  bladder: Monitor for signs of infection.    WBC 11.2 on 9/4, improving  Continue to monitor  9/10- WBC 7.5k- stable 10. Hypokalemia: Continue daily supplement and adjust as needed.  CMP ordered for tomorrow a.m.  Potassium 4.3 on 9/4  9/10- K+ 4.4 and stable 11. HTN: Monitor BP tid--continue atenolol               Vitals:   01/07/19 1958 01/08/19 0402  BP: 138/78 (!) 142/82  Pulse: 70 69  Resp: 16 16  Temp: 98.5 F (36.9 C) 97.9 F (36.6 C)  SpO2: 98% 99%  controlled 9/13 12. Seizure disorder: Continue Keppra 1500 mg bid.  Monitor reoccurrence. 15. 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  16. Attention/fatigue trial Provigil since doesn't raise BP as much 100 mg daily  17. Vertigo/dizziness- don't want to add Meclizine to an already sedated gentleman- asked PT to give him rest breaks and will monitor for now.  18. R hip  dysplasia- was due to go to [facility] for surgery-on hold at present 19.  Post stroke dysphagia: D3 thins, advance diet as tolerated  20. Dispo- team conference today- d/c date 01/19/2019  9/11- going to SNF- just determined today   LOS: 11 days A FACE TO FACE EVALUATION WAS PERFORMED  Charlett Blake 01/08/2019, 8:38 AM

## 2019-01-09 ENCOUNTER — Inpatient Hospital Stay (HOSPITAL_COMMUNITY): Payer: Medicare Other | Admitting: Occupational Therapy

## 2019-01-09 ENCOUNTER — Inpatient Hospital Stay (HOSPITAL_COMMUNITY): Payer: Medicare Other

## 2019-01-09 NOTE — Progress Notes (Addendum)
Physical Therapy Session Note  Patient Details  Name: Carlos Ferguson MRN: [id number] Date of Birth: 03-15-1966  Today's Date: 01/09/2019 PT Individual Time: 1505-1545 13:00 to 13:20 and 13:30 to 14:00 (due to requiring cath) PT Individual Time Calculation (min): 40 min and  69mn, 30 min TOTAL 927m Short Term Goals: Week 1:  PT Short Term Goal 1 (Week 1): Pt will maintain sitting balance EOB with mod A PT Short Term Goal 1 - Progress (Week 1): Met PT Short Term Goal 2 (Week 1): Pt will complete least restrictive transfer with assist x 1 consistently PT Short Term Goal 2 - Progress (Week 1): Met PT Short Term Goal 3 (Week 1): Pt will initiate w/c mobility PT Short Term Goal 3 - Progress (Week 1): Met Week 2:  PT Short Term Goal 1 (Week 2): Pt will perform least restrictive transfer with CGA consistently PT Short Term Goal 2 (Week 2): Pt will propel w/c x 150 ft with BUE and CGA PT Short Term Goal 3 (Week 2): Pt will tolerate sitting up in w/c x 2 hours Week 3:     Skilled Therapeutic Interventions/Progress Updates:  First session Pain:  Pt c/o headache and stated he had just received pain meds from nurse to address.  Therapy to tolerance.       Pt initially supine in bed Therapist raised hob and pt able to donn leg braces w/min assist/set up Dependent for donning pants and shoes in this position.    Sitting w/hob raised pt able to pivot to long sitting w/feet at edge of bed w/cga.  Sitting balance on edge of bed and therex including: Single arm raise progressing to double arm raises w/mild increased sway and cga Scapular retraction x 10 Lateral raises x 12  Pt requested to be catheterized at this point and stated he was fearful he would "leak" on his only pair of pants if this was not performed.  Sit to supine w/supervision.  Nursing called and performed bladder scan and cath.  Session resumed/Session 2.  Pain - no change from above   Pt rolled L/R w/rails mod I while  therapist raised pants.  Pt able to button and zip pants without Assist. Supine to sit on edge of bed w/cga w/hob elevated. Bed to wc via sliding board w/setup assist and cga.   wc propulsion x 15072f/cues due to decreased vision.  Sit to stand performed facing stairs and using stair rails to pull up, mod assist.  Stood 1 min x 2, 30 sec x 2 w/mod assist due to hip/trunk weakness, cues for erect posture, heavy reliance on UE's., L leg brace locked. Pt returned to room, ramained OOB in wc w/needs in reach.    Third Session Pain:  Pt c/o mild LLE pain 3/10, aching in nature.  Treatment to tolerance  Pt OOB in wc initially. Therapist transported pt to gym. wc to /from mat via lateral scoot w/set up/therapist stabilizing wc, cga.  In sitting performed the following UE/core/sitting balance exercises requiring min to cga for balance: Push up blocks x 8 Seated press outs using 3lb bar x 2 Overhead press x 10 3lb bar  wc to bed via sliding board transfer w/cga and set up, 4inch step under feet, but pt primarily transferring using UE's. Sit to supine mod I Pt able to unbutton and unzip pants but max assist to complete removal Braces removed/pt able to unstrap first 3 straps of L brace, therapist completed romoval of both braces.  Reviewed sessions and completed memory notebook. Pt left supine w/rails up x 3, bed alarm set, needs in reach, bed lowered.  Assessment:  Pt making improvements w/transfers, balance, endurance.  Would benefit from cont IPR to continue to address all goals, decrease caregiver burden, increase functional independence.  Therapy Documentation Precautions:  Precautions Precautions: Fall Restrictions Weight Bearing Restrictions: No    Therapy/Group: Individual Therapy  Callie Fielding, [place] 01/09/2019, 4:16 PM

## 2019-01-09 NOTE — Progress Notes (Signed)
Speech Language Pathology Daily Session Note  Patient Details  Name: Carlos Ferguson MRN: [id number] Date of Birth: 1965/07/10  Today's Date: 01/09/2019 SLP Individual Time: 0905-1000 SLP Individual Time Calculation (min): 55 min  Short Term Goals: Week 2: SLP Short Term Goal 1 (Week 2): Pt will consume regular diet with minimal overt s/s of aspiration given Mod I for use of compensatory swallow strategies. SLP Short Term Goal 1 - Progress (Week 2): Updated due to goal met SLP Short Term Goal 2 (Week 2): Pt will demonstrate selective attention for 15 minutes with Min A cues. SLP Short Term Goal 3 (Week 2): Pt will complete semi-complex problem solving tasks with Mod A cues. SLP Short Term Goal 4 (Week 2): Pt will utilize speech intelligibility strategies with supervision cues to achieve > 90% speech intelligibility at the conversation level. SLP Short Term Goal 5 (Week 2): Given Mod A cues, pt will demonstrate use of 1 compensatory memory strategy to recall basic daily information in 5 out of 10 opportunities.  Skilled Therapeutic Interventions: Skilled ST services focused on cognitive skills. Pt expressed improvement in swallow, with upgraded diet and speech intelligibility skills, with continued concern for cognitive skills. SLP introduced memory strategies WRAP and began memory notebook.  Pt utilized associations, repetition, witting and creating pictures to aid in recall of word and symbol x2, pt required max A verbal cues for recall and mod A verbal cues to recall where to locate answers (in notebook.) Pt was unable to write at phrase/sentence level and required mod A verbal cues to copy at sentence level for organization and awareness of errors, which pt expressed as a noted change from acute CVA. SLP recorded today's events in notebook and pt demonstrated ability to read events and agreed to read/repeat listed events for recall in tomorrow's ST session. Pt was left in room with call bell within  reach and bed alarm set. ST recommends to continue skilled ST services.      Pain Pain Assessment Pain Scale: 0-10 Pain Score: 0-No pain Pain Location: Hip Pain Orientation: Right Pain Descriptors / Indicators: Discomfort Pain Intervention(s): Repositioned  Therapy/Group: Individual Therapy  Edwyna Dangerfield  [facility] 01/09/2019, 12:47 PM

## 2019-01-09 NOTE — Progress Notes (Signed)
Occupational Therapy Session Note  Patient Details  Name: Carlos Ferguson MRN: [id number] Date of Birth: [birth year]  Today's Date: 01/09/2019 OT Individual Time: 1130-1200 OT Individual Time Calculation (min): 30 min    Short Term Goals: Week 2:  OT Short Term Goal 1 (Week 2): Pt will direct caregiver in set-up of sliding board transfer with no more than 2 questioning cues OT Short Term Goal 2 (Week 2): Pt will don pants from bed level with set-up/supervision using hospital bed functions. OT Short Term Goal 3 (Week 2): Pt will orient and don shirt with set-up assist  Skilled Therapeutic Interventions/Progress Updates:    Patient prone in bed, cooperative, states that his hip is bothering him - agreeable to therapy session but prefers to complete bed side due to short time for applying braces.  Completed UB exercises in SSP and in supine, UB seated pushups with one bed rail and push up block.  Patient was incontinent of loose BM requiring max A to clean up and change brief.  Patient remained in bed at close of session with bed alarm set and call bell in reach.    Therapy Documentation Precautions:  Precautions Precautions: Fall Restrictions Weight Bearing Restrictions: No General:   Vital Signs:   Pain: Pain Assessment Pain Scale: 0-10 Pain Score: 4  Pain Type: Chronic pain Pain Location: Hip Pain Orientation: Right Pain Descriptors / Indicators: Discomfort Pain Frequency: Constant Pain Onset: Gradual Patients Stated Pain Goal: 0 Pain Intervention(s): Repositioned   Other Treatments:     Therapy/Group: Individual Therapy  Carlos Levering 01/09/2019, 12:24 PM

## 2019-01-09 NOTE — Progress Notes (Signed)
PHYSICAL MEDICINE & REHABILITATION PROGRESS NOTE   Subjective/Complaints:  Is going to SNF when leaves here. Said bottom feels somewhat better lately.  Laying on stomach/L side to avoid being on bottom, per pt- sleeping that way.  ROS:   Denies CP, shortness of breath, nausea, vomiting.  Objective:   No results found. No results for input(s): WBC, HGB, HCT, PLT in the last 72 hours. No results for input(s): NA, K, CL, CO2, GLUCOSE, BUN, CREATININE, CALCIUM in the last 72 hours.  Intake/Output Summary (Last 24 hours) at 01/09/2019 1044 Last data filed at 01/09/2019 0809 Gross per 24 hour  Intake 600 ml  Output 325 ml  Net 275 ml     Physical Exam: Vital Signs Blood pressure 137/87, pulse 77, temperature 98.2 F (36.8 C), temperature source Oral, resp. rate 17, height 4\' 9"  (1.448 m), weight 62.1 kg, SpO2 98 %.   Physical Exam  Constitutional: No distress . Vital signs reviewed. Awake, alert, appropriate, laying on L side/stomach to avoid sleeping on bottom; slightly dysarthric, but chronic HENT: Normocephalic.  Atraumatic. Eyes: Abnormal extraocular motions.  No discharge. Cardiovascular: RRR Respiratory: CTA B/L GI: Non-distended. Soft, NT Skin: Warm and dry.  Intact. Psych: Normal mood.  Normal behavior. 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   Sensation reduced in BLE  Skin: bottom pink, not red/raw anymore- healing well entire middle of buttocks B/L Psychiatric: Flat.   Assessment/Plan: 1. Functional deficits secondary to cerebellar hematoma/severe hydrocephalus- due to hypertensive  crisis - with a hx of Chiari malformation and VP shunt as well as spina bifida which require 3+ hours per day of interdisciplinary therapy in a comprehensive inpatient rehab setting.  Physiatrist is providing close team supervision and 24 hour management of active medical problems listed below.  Physiatrist and rehab team continue to assess barriers to discharge/monitor patient progress toward functional and medical goals  Care Tool:  Bathing    Body parts bathed by patient: Right arm, Left arm, Chest, Abdomen, Right upper leg, Left upper leg, Right lower leg, Left lower leg, Face, Front perineal area   Body parts bathed by helper: Buttocks     Bathing assist Assist Level: Contact Guard/Touching assist     Upper Body Dressing/Undressing Upper body dressing   What is the patient wearing?: Button up shirt    Upper body assist Assist Level: Moderate Assistance - Patient 50 - 74%    Lower Body Dressing/Undressing Lower body dressing      What is the patient wearing?: Incontinence brief, Pants     Lower body assist Assist for lower body dressing: Moderate Assistance - Patient 50 - 74%     Toileting Toileting    Toileting assist Assist for toileting: Minimal Assistance - Patient > 75%     Transfers Chair/bed transfer  Transfers assist  Chair/bed transfer activity did not occur: Safety/medical concerns  Chair/bed transfer assist level: Contact Guard/Touching assist     Locomotion Ambulation   Ambulation assist   Ambulation activity did not occur: Safety/medical concerns          Walk 10 feet activity   Assist  Walk 10 feet activity did not occur: Safety/medical concerns  Walk 50 feet activity   Assist Walk 50 feet with 2 turns activity did not occur: Safety/medical concerns         Walk 150 feet activity   Assist Walk 150 feet activity did not occur: Safety/medical concerns         Walk 10 feet on uneven surface   activity   Assist Walk 10 feet on uneven surfaces activity did not occur: Safety/medical concerns         Wheelchair     Assist Will patient use wheelchair at discharge?: Yes Type of Wheelchair: Manual Wheelchair activity did not occur: Safety/medical concerns  Wheelchair assist level: Supervision/Verbal cueing Max wheelchair distance: 150'    Wheelchair 50 feet with 2 turns activity    Assist    Wheelchair 50 feet with 2 turns activity did not occur: Safety/medical concerns   Assist Level: Supervision/Verbal cueing   Wheelchair 150 feet activity     Assist  Wheelchair 150 feet activity did not occur: Safety/medical concerns   Assist Level: Supervision/Verbal cueing   Blood pressure 137/87, pulse 77, temperature 98.2 F (36.8 C), temperature source Oral, resp. rate 17, height 4\' 9"  (1.448 m), weight 62.1 kg, SpO2 98 %.   Medical Problem List and Plan: 1.  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  Antithrombotics: -DVT/anticoagulation:  Mechanical: Foot pump Bilateral lower extremities             -antiplatelet therapy: N/A due to bleed 3. Headaches/Pain Management: Added Ultram prn.   9/10- will increase Depakote ER to 750 mg daily and add norco for when tylneol and tramadol aren't effective. Will see what works- don't want triptans due to previous recent stroke/hemorrhage.  9/11- will add 2 lidoderm patches 8pm to 8am daily for neck tightness 4. Mood: LCSW to follow for evaluation and support.              -antipsychotic agents: N/A 5. Neuropsych: This patient is fully capable of making decisions on his own behalf. 6. Skin/Wound Care: Routine pressure relief measures.  7. Fluids/Electrolytes/Nutrition: Monitor I/Os. Tolerating current diet.  8. Headaches: Persistent but appears to be improving.  Depakote added.  9/9- tylenol is helpful- doesn't want to try  other meds currently.  9/10- HA worse- increased depakote to 750 mg daily and added Norco prn.  9/11- better, but added lidoderm for neck tightness 9. Leucocytosis with neurogenic bladder: Monitor for signs of infection.    WBC 11.2 on 9/4, improving  Continue to monitor  9/10- WBC 7.5k- stable 10. Hypokalemia: Continue daily supplement and adjust as needed.  CMP ordered for tomorrow a.m.  Potassium 4.3 on 9/4  9/10- K+ 4.4 and stable 11. HTN: Monitor BP tid--continue atenolol               Vitals:   01/08/19 1918 01/09/19 0517  BP: 120/70 137/87  Pulse: 65 77  Resp: 16 17  Temp: 98.4 F (36.9 C) 98.2 F (36.8 C)  SpO2: 98% 98%  controlled 9/13 12. Seizure disorder: Continue Keppra 1500 mg bid.  Monitor reoccurrence. 15. 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  9/14- healing well 16. Attention/fatigue trial Provigil since doesn't raise BP as much 100 mg daily- can  likely send out on Ritalin   17. Vertigo/dizziness- don't want to add Meclizine to an already sedated gentleman- asked PT to give him rest breaks and will monitor for now.  18. R hip dysplasia- was due to go to [facility] for surgery-on hold at present 19.  Post stroke dysphagia: D3 thins, advance diet as tolerated  20. Dispo- team conference today- d/c date 01/19/2019  9/11- going to SNF- just determined today   LOS: 12 days A FACE TO FACE EVALUATION WAS PERFORMED  Calvin Jablonowski 01/09/2019, 10:44 AM

## 2019-01-10 ENCOUNTER — Inpatient Hospital Stay (HOSPITAL_COMMUNITY): Payer: Medicare Other | Admitting: Occupational Therapy

## 2019-01-10 ENCOUNTER — Inpatient Hospital Stay (HOSPITAL_COMMUNITY): Payer: Medicare Other

## 2019-01-10 ENCOUNTER — Inpatient Hospital Stay (HOSPITAL_COMMUNITY): Payer: Medicare Other | Admitting: Physical Therapy

## 2019-01-10 NOTE — Progress Notes (Signed)
[place] PHYSICAL MEDICINE & REHABILITATION PROGRESS NOTE   Subjective/Complaints:  Pt reports having a Headache this AM- it depends on the way he turns his head- hadn't asked for pain meds as of yet- reminded him to, but he said, if turns head the "right" way, it doesn't hurt.  Wants me to stop the lidoderm patches- crick in neck is gone.  Thought didn't help, but then crick in neck has resolved, so not sure.   ROS:   Denies CP, shortness of breath, nausea, vomiting.  Objective:   No results found. No results for input(s): WBC, HGB, HCT, PLT in the last 72 hours. No results for input(s): NA, K, CL, CO2, GLUCOSE, BUN, CREATININE, CALCIUM in the last 72 hours.  Intake/Output Summary (Last 24 hours) at 01/10/2019 1036 Last data filed at 01/10/2019 0725 Gross per 24 hour  Intake 297 ml  Output 620 ml  Net -323 ml     Physical Exam: Vital Signs Blood pressure (!) 143/79, pulse 71, temperature 98.7 F (37.1 C), temperature source Oral, resp. rate 18, height 4\' 9"  (1.448 m), weight 63.9 kg, SpO2 97 %.   Physical Exam  Constitutional: No distress . Vital signs reviewed. Awake, alert, appropriate,lying supine in bed, working with PT, c/o HA, slightly dysarthric, but chronic HENT: Normocephalic.  Atraumatic. Eyes: Abnormal extraocular motions- saw nystagmus this AM.  No discharge. Cardiovascular: RRR Respiratory: CTA B/L GI: Non-distended. Soft, NT Skin: Warm and dry.  Intact. Psych: Normal mood.  Normal behavior. Musc: Lower extremities with healed incisions and deformities; much less tight muscles in L splenius capitus, upper trap and scalenes- improved greatly Neurological: Alert Dysarthria Able to follow commands Motor: Bilateral upper extremities: 4+/5 proximal distal, unchanged No movement noted in bilateral lower extremities, however per report, paraplegia RLE>LLE with bilateral foot drop.   Sensation reduced in BLE  Skin: bottom pink, not red/raw anymore- healing  well entire middle of buttocks B/L Psychiatric: Flat.   Assessment/Plan: 1. 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  Physiatrist is providing close team supervision and 24 hour management of active medical problems listed below.  Physiatrist and rehab team continue to assess barriers to discharge/monitor patient progress toward functional and medical goals  Care Tool:  Bathing    Body parts bathed by patient: Right arm, Left arm, Chest, Abdomen, Right upper leg, Left upper leg, Right lower leg, Left lower leg, Face, Front perineal area   Body parts bathed by helper: Buttocks     Bathing assist Assist Level: Contact Guard/Touching assist     Upper Body Dressing/Undressing Upper body dressing   What is the patient wearing?: Button up shirt    Upper body assist Assist Level: Moderate Assistance - Patient 50 - 74%    Lower Body Dressing/Undressing Lower body dressing      What is the patient wearing?: Incontinence brief, Pants     Lower body assist Assist for lower body dressing: Moderate Assistance - Patient 50 - 74%     Toileting Toileting    Toileting assist Assist for toileting: Dependent - Patient 0%     Transfers Chair/bed transfer  Transfers assist  Chair/bed transfer activity did not occur: Safety/medical concerns  Chair/bed transfer assist level: Contact Guard/Touching assist(sliding board and lateral scoot)     Locomotion Ambulation  Ambulation assist   Ambulation activity did not occur: Safety/medical concerns          Walk 10 feet activity   Assist  Walk 10 feet activity did not occur: Safety/medical concerns        Walk 50 feet activity   Assist Walk 50 feet with 2 turns activity did not occur:  Safety/medical concerns         Walk 150 feet activity   Assist Walk 150 feet activity did not occur: Safety/medical concerns         Walk 10 feet on uneven surface  activity   Assist Walk 10 feet on uneven surfaces activity did not occur: Safety/medical concerns         Wheelchair     Assist Will patient use wheelchair at discharge?: Yes Type of Wheelchair: Manual Wheelchair activity did not occur: Safety/medical concerns  Wheelchair assist level: Supervision/Verbal cueing Max wheelchair distance: 150'    Wheelchair 50 feet with 2 turns activity    Assist    Wheelchair 50 feet with 2 turns activity did not occur: Safety/medical concerns   Assist Level: Supervision/Verbal cueing   Wheelchair 150 feet activity     Assist  Wheelchair 150 feet activity did not occur: Safety/medical concerns   Assist Level: Supervision/Verbal cueing   Blood pressure (!) 143/79, pulse 71, temperature 98.7 F (37.1 C), temperature source Oral, resp. rate 18, height 4\' 9"  (1.448 m), weight 63.9 kg, SpO2 97 %.   Medical Problem List and Plan: 1.  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  Antithrombotics: -DVT/anticoagulation:  Mechanical: Foot pump Bilateral lower extremities             -antiplatelet therapy: N/A due to bleed 3. Headaches/Pain Management: Added Ultram prn.   9/10- will increase Depakote ER to 750 mg daily and add norco for when tylneol and tramadol aren't effective. Will see what works- don't want triptans due to previous recent stroke/hemorrhage.  9/11- will add 2 lidoderm patches 8pm to 8am daily for neck tightness  9/15- pt asked to stop lidoderm- ordered 4. Mood: LCSW to follow for evaluation and support.              -antipsychotic agents: N/A 5. Neuropsych: This patient is fully capable of making decisions on his own behalf. 6. Skin/Wound Care:  Routine pressure relief measures.  7. Fluids/Electrolytes/Nutrition: Monitor I/Os. Tolerating current diet.  8. Headaches: Persistent but appears to be improving.  Depakote added.  9/9- tylenol is helpful- doesn't want to try other meds currently.  9/10- HA worse- increased depakote to 750 mg daily and added Norco prn.  9/11- better, but added lidoderm for neck tightness  9/15- bad day- stopped lidoderm per pt request 9. Leucocytosis with neurogenic bladder: Monitor for signs of infection.    WBC 11.2 on 9/4, improving  Continue to monitor  9/10- WBC 7.5k- stable 10. Hypokalemia: Continue daily supplement and adjust as needed.  CMP ordered for tomorrow a.m.  Potassium 4.3 on 9/4  9/10- K+ 4.4 and stable 11. HTN: Monitor BP tid--continue atenolol               Vitals:   01/09/19 1918 01/10/19 0618  BP: 127/72 (!) 143/79  Pulse: 83 71  Resp: 16 18  Temp: 99.2 F (37.3 C) 98.7 F (37.1 C)  SpO2: 98% 97%  controlled 9/13 12. Seizure disorder: Continue Keppra 1500 mg bid.  Monitor reoccurrence. 15. 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  9/14- healing well 16. Attention/fatigue trial Provigil since doesn't raise BP as much 100 mg daily- can likely send out on Ritalin   17. Vertigo/dizziness- don't want to add Meclizine to an already sedated gentleman- asked PT to give him rest breaks and will monitor for now.  18. R hip dysplasia- was due to go to [facility] for surgery-on hold at present 19.  Post stroke dysphagia: D3 thins, advance diet as tolerated  20. Dispo- team conference today- d/c date 01/19/2019  9/11- going to SNF- just determined today   LOS: 13 days A FACE TO FACE EVALUATION WAS PERFORMED  Kahleb Mcclane 01/10/2019, 10:36 AM

## 2019-01-10 NOTE — Progress Notes (Signed)
Physical Therapy Session Note  Patient Details  Name: Teven Mittman MRN: [id number] Date of Birth: [birth year]  Today's Date: 01/10/2019 PT Individual Time: 0800-0830 PT Individual Time Calculation (min): 30 min  PT Missed Time: 30 min Missed Time Reason: patient ill (headache and dizziness)  Short Term Goals: Week 2:  PT Short Term Goal 1 (Week 2): Pt will perform least restrictive transfer with CGA consistently PT Short Term Goal 2 (Week 2): Pt will propel w/c x 150 ft with BUE and CGA PT Short Term Goal 3 (Week 2): Pt will tolerate sitting up in w/c x 2 hours  Skilled Therapeutic Interventions/Progress Updates:    Pt received seated in bed finishing breakfast, agreeable to PT session. Pt reports an intermittent headache this AM that started early, reports he has already received pain medication and headache not rated. Pt also reports ongoing dizziness, rated 3/10. Pt agreeable to get dressed this AM, min A to don BLE braces with assist to strap most distal strap due to increase in headache pain with anterior leaning. Pt is max A to don pants in long-sitting position, rolling L/R with use of bedrails and Supervision to don pants remainder of the way. Pt requires increased time to complete LB dressing due to headache and need to rest between directional rolling. Pt declines any further therapy this AM after donning pants due to ongoing headache. Pt left sidelying in bed with needs in reach, bed alarm in place at end of session. Pt missed 30 min of therapy due to headache and dizziness.  Therapy Documentation Precautions:  Precautions Precautions: Fall Restrictions Weight Bearing Restrictions: No    Therapy/Group: Individual Therapy   Excell Seltzer, PT, DPT  01/10/2019, 12:35 PM

## 2019-01-10 NOTE — Progress Notes (Signed)
Speech Language Pathology Daily Session Note  Patient Details  Name: Carlos Ferguson MRN: [id number] Date of Birth: 1966/02/10  Today's Date: 01/10/2019 SLP Individual Time: 1330-1413 SLP Individual Time Calculation (min): 43 min  Short Term Goals: Week 2: SLP Short Term Goal 1 (Week 2): Pt will consume regular diet with minimal overt s/s of aspiration given Mod I for use of compensatory swallow strategies. SLP Short Term Goal 1 - Progress (Week 2): Updated due to goal met SLP Short Term Goal 2 (Week 2): Pt will demonstrate selective attention for 15 minutes with Min A cues. SLP Short Term Goal 3 (Week 2): Pt will complete semi-complex problem solving tasks with Mod A cues. SLP Short Term Goal 4 (Week 2): Pt will utilize speech intelligibility strategies with supervision cues to achieve > 90% speech intelligibility at the conversation level. SLP Short Term Goal 4 - Progress (Week 2): Met SLP Short Term Goal 5 (Week 2): Given Mod A cues, pt will demonstrate use of 1 compensatory memory strategy to recall basic daily information in 5 out of 10 opportunities.  Skilled Therapeutic Interventions:Skilled ST services focused on cognitive skills. SLP facilitated recall of yesterday and today's events, pt required min A verbal cues to recall/navigate memory notebook. SLP also facilitated semi-complex problem solving skills in calendar task, SLP recorded events and pt required mod A verbal cues to interpret events while reading. SLP educated pt in the use of "text to talk" on phone, after pt expressed fraustrating texting and deficits with visual changes post CVA, pt required max A fade to mod A verbal cues to use feature, suggest majority of errors due to reduce vision. Pt demonstrated mod I use of speech strategies in conversation with 90% intelligibility and supports improved speech ability. Pt was left in room with call bell within reach and chair alarm set. ST recommends to continue skilled ST services.       Pain Pain Assessment Pain Scale: 0-10 Pain Score: 0-No pain  Therapy/Group: Individual Therapy  Marysa Wessner  [facility] 01/10/2019, 3:04 PM

## 2019-01-10 NOTE — Progress Notes (Signed)
Occupational Therapy Session Note  Patient Details  Name: Carlos Ferguson MRN: [id number] Date of Birth: 08-19-1965  Today's Date: 01/10/2019 OT Individual Time: 0945-1100 OT Individual Time Calculation (min): 75 min    Short Term Goals: Week 2:  OT Short Term Goal 1 (Week 2): Pt will direct caregiver in set-up of sliding board transfer with no more than 2 questioning cues OT Short Term Goal 2 (Week 2): Pt will don pants from bed level with set-up/supervision using hospital bed functions. OT Short Term Goal 3 (Week 2): Pt will orient and don shirt with set-up assist  Skilled Therapeutic Interventions/Progress Updates:    Pt seen for OT session focusing on ADL re-training and UE strengthening. Pt in supine upon arrival, voiced feeling "druged", and fatigued. Willing to participate as able and denied need to alert RN as already aware.   ADL re-training: Shoes donned total A bed level. UB bathing/dressing from w/c level at sink with set-up/supervision. Pt able to correctly orient shirt today independently. Grooming tasks mod I from w/c level  UE strengthening: Completed UE strengthening HEP as noted below using level I (orange) theraband. Pt required max multi-modal cuing including demonstration, mirrored, tactile cues, written directions and pictured directions. He was unable to recall exercises btwn sets. Rest breaks required btwn each exercise.   x2 sets of 10  Shoulder ABduction  Diagonal up  Diagonal Down  Bicep curl  Pt left sitting up in w/c at end of session, chair belt alarm on and all needs in reach.   Therapy Documentation Precautions:  Precautions Precautions: Fall Restrictions Weight Bearing Restrictions: No   Therapy/Group: Individual Therapy  Elby Blackwelder L 01/10/2019, 6:55 AM

## 2019-01-11 ENCOUNTER — Inpatient Hospital Stay (HOSPITAL_COMMUNITY): Payer: Medicare Other | Admitting: Physical Therapy

## 2019-01-11 ENCOUNTER — Inpatient Hospital Stay (HOSPITAL_COMMUNITY): Payer: Medicare Other | Admitting: Speech Pathology

## 2019-01-11 ENCOUNTER — Inpatient Hospital Stay (HOSPITAL_COMMUNITY): Payer: Medicare Other

## 2019-01-11 ENCOUNTER — Inpatient Hospital Stay (HOSPITAL_COMMUNITY): Payer: Medicare Other | Admitting: Occupational Therapy

## 2019-01-11 DIAGNOSIS — R519 Headache, unspecified: Secondary | ICD-10-CM

## 2019-01-11 LAB — BASIC METABOLIC PANEL
Anion gap: 8 (ref 5–15)
BUN: 10 mg/dL (ref 6–20)
CO2: 27 mmol/L (ref 22–32)
Calcium: 8.8 mg/dL — ABNORMAL LOW (ref 8.9–10.3)
Chloride: 107 mmol/L (ref 98–111)
Creatinine, Ser: 0.77 mg/dL (ref 0.61–1.24)
GFR calc Af Amer: >60 mL/min (ref >60)
GFR calc non Af Amer: >60 mL/min (ref >60)
Glucose, Bld: 102 mg/dL — ABNORMAL HIGH (ref 70–99)
Potassium: 5 mmol/L (ref 3.5–5.1)
Sodium: 142 mmol/L (ref 135–145)

## 2019-01-11 LAB — CBC
HCT: 44.9 % (ref 39.0–52.0)
Hemoglobin: 14.9 g/dL (ref 13.0–17.0)
MCH: 31.1 pg (ref 26.0–34.0)
MCHC: 33.2 g/dL (ref 30.0–36.0)
MCV: 93.7 fL (ref 80.0–100.0)
Platelets: 245 10*3/uL (ref 150–400)
RBC: 4.79 MIL/uL (ref 4.22–5.81)
RDW: 13.6 % (ref 11.5–15.5)
WBC: 6.6 10*3/uL (ref 4.0–10.5)
nRBC: 0 % (ref 0.0–0.2)

## 2019-01-11 MED ORDER — POTASSIUM CHLORIDE CRYS ER 10 MEQ PO TBCR
10.0000 meq | EXTENDED_RELEASE_TABLET | Freq: Every day | ORAL | Status: DC
  Administered 2019-01-12 – 2019-01-16 (×5): 10 meq via ORAL
  Filled 2019-01-11 (×5): qty 1

## 2019-01-11 NOTE — Progress Notes (Signed)
[place] PHYSICAL MEDICINE & REHABILITATION PROGRESS NOTE   Subjective/Complaints:  Pt reports hon regular diet now- likes it better. Still having HA, comes and goes was on R then on L- but feels more like it's a sinus HA now than CVA HA.    ROS:   Denies CP, shortness of breath, nausea, vomiting.  Objective:   No results found. Recent Labs    01/11/19 0548  WBC 6.6  HGB 14.9  HCT 44.9  PLT 245   Recent Labs    01/11/19 0548  NA 142  K 5.0  CL 107  CO2 27  GLUCOSE 102*  BUN 10  CREATININE 0.77  CALCIUM 8.8*    Intake/Output Summary (Last 24 hours) at 01/11/2019 1136 Last data filed at 01/11/2019 1102 Gross per 24 hour  Intake 535 ml  Output 520 ml  Net 15 ml     Physical Exam: Vital Signs Blood pressure 136/80, pulse 74, temperature 98.1 F (36.7 C), temperature source Oral, resp. rate 18, height 4\' 9"  (1.448 m), weight 63.9 kg, SpO2 99 %.   Physical Exam  Constitutional: No distress . Vital signs reviewed. Awake, alert, appropriate,sitting up in bed; PT and PTA in room working with pt, c/o HA, slightly dysarthric, but chronic HENT: Normocephalic.  Atraumatic. Eyes: Abnormal extraocular motions- saw nystagmus this AM.  No discharge. Cardiovascular: RRR Respiratory: CTA B/L GI: Non-distended. Soft, NT Skin: Warm and dry.  Intact. Psych: Normal mood.  Normal behavior. Musc: Lower extremities with healed incisions and deformities; much less tight muscles in L splenius capitus, upper trap and scalenes- improved greatly Neurological: Alert Dysarthria Able to follow commands Motor: Bilateral upper extremities: 4+/5 proximal distal, unchanged No movement noted in bilateral lower extremities, however per report, paraplegia RLE>LLE with bilateral foot drop.   Sensation reduced in BLE  Skin: bottom pink, not red/raw anymore- healing well entire middle of buttocks B/L Psychiatric: Flat.   Assessment/Plan: 1. 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  Physiatrist is providing close team supervision and 24 hour management of active medical problems listed below.  Physiatrist and rehab team continue to assess barriers to discharge/monitor patient progress toward functional and medical goals  Care Tool:  Bathing    Body parts bathed by patient: Right arm, Left arm, Chest, Abdomen, Right upper leg, Left upper leg, Right lower leg, Left lower leg, Face, Front perineal area   Body parts bathed by helper: Buttocks     Bathing assist Assist Level: Contact Guard/Touching assist     Upper Body Dressing/Undressing Upper body dressing   What is the patient wearing?: Pull over shirt    Upper body assist Assist Level: Supervision/Verbal cueing    Lower Body Dressing/Undressing Lower body dressing      What is the patient wearing?: Incontinence brief, Pants     Lower body assist Assist for lower body dressing: Moderate Assistance - Patient 50 - 74%     Toileting Toileting    Toileting assist Assist for toileting: Dependent - Patient 0%     Transfers Chair/bed transfer  Transfers assist  Chair/bed transfer activity did not occur: Safety/medical concerns  Chair/bed transfer assist level: Contact Guard/Touching assist(sliding board and lateral scoot)     Locomotion Ambulation   Ambulation assist   Ambulation activity did not occur:  Safety/medical concerns          Walk 10 feet activity   Assist  Walk 10 feet activity did not occur: Safety/medical concerns        Walk 50 feet activity   Assist Walk 50 feet with 2 turns activity did not occur: Safety/medical concerns         Walk 150 feet activity   Assist Walk 150 feet activity did not occur: Safety/medical concerns          Walk 10 feet on uneven surface  activity   Assist Walk 10 feet on uneven surfaces activity did not occur: Safety/medical concerns         Wheelchair     Assist Will patient use wheelchair at discharge?: Yes Type of Wheelchair: Manual Wheelchair activity did not occur: Safety/medical concerns  Wheelchair assist level: Supervision/Verbal cueing Max wheelchair distance: 150'    Wheelchair 50 feet with 2 turns activity    Assist    Wheelchair 50 feet with 2 turns activity did not occur: Safety/medical concerns   Assist Level: Supervision/Verbal cueing   Wheelchair 150 feet activity     Assist  Wheelchair 150 feet activity did not occur: Safety/medical concerns   Assist Level: Supervision/Verbal cueing   Blood pressure 136/80, pulse 74, temperature 98.1 F (36.7 C), temperature source Oral, resp. rate 18, height 4\' 9"  (1.448 m), weight 63.9 kg, SpO2 99 %.   Medical Problem List and Plan: 1.  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  Antithrombotics: -DVT/anticoagulation:  Mechanical: Foot pump Bilateral lower extremities             -antiplatelet therapy: N/A due to bleed 3. Headaches/Pain Management: Added Ultram prn.   9/10- will increase Depakote ER to 750 mg daily and add norco for when tylneol and tramadol aren't effective. Will see what works- don't want triptans due to previous recent stroke/hemorrhage.  9/11- will add 2 lidoderm patches 8pm to 8am daily for neck tightness  9/15- pt asked to stop lidoderm- ordered 4. Mood: LCSW to follow for evaluation and support.              -antipsychotic agents: N/A 5. Neuropsych: This patient is fully capable of making decisions on his own behalf. 6. Skin/Wound Care: Routine pressure relief measures.  7. Fluids/Electrolytes/Nutrition: Monitor I/Os. Tolerating current diet.  8. Headaches: Persistent but appears  to be improving.  Depakote added.  9/9- tylenol is helpful- doesn't want to try other meds currently.  9/10- HA worse- increased depakote to 750 mg daily and added Norco prn.  9/11- better, but added lidoderm for neck tightness  9/15- bad day- stopped lidoderm per pt request 9. Leucocytosis with neurogenic bladder: Monitor for signs of infection.    WBC 11.2 on 9/4, improving  Continue to monitor  9/10- WBC 7.5k- stable 10. Hypokalemia: Continue daily supplement and adjust as needed.  CMP ordered for tomorrow a.m.  Potassium 4.3 on 9/4  9/10- K+ 4.4 and stable  9/16- K+ up to 5.0- will decrease K+ to 10 mEq daily and monitor 11. HTN: Monitor BP tid--continue atenolol               Vitals:   01/10/19 1932 01/11/19 0450  BP: 134/86 136/80  Pulse: 79 74  Resp: 18 18  Temp: 98.6 F (37 C) 98.1 F (36.7 C)  SpO2: 98% 99%  controlled 9/13 12. Seizure disorder: Continue  Keppra 1500 mg bid.  Monitor reoccurrence. 15. 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  9/14- healing well 16. Attention/fatigue trial Provigil since doesn't raise BP as much 100 mg daily- can likely send out on Ritalin   17. Vertigo/dizziness- don't want to add Meclizine to an already sedated gentleman- asked PT to give him rest breaks and will monitor for now.  18. R hip dysplasia- was due to go to [facility] for surgery-on hold at present 19.  Post stroke dysphagia: D3 thins, advance diet as tolerated  9/16- advanced to regular/thin- doing well 20. Dispo- team conference today- d/c date 01/19/2019  9/11- going to SNF- just determined today  9/16- looking for bed   LOS: 14 days A FACE TO FACE EVALUATION WAS PERFORMED  Carlos Ferguson 01/11/2019, 11:36 AM

## 2019-01-11 NOTE — [place] FL2 (Signed)
[place] MEDICAID FL2 LEVEL OF CARE SCREENING TOOL     IDENTIFICATION  Patient Name: Carlos Ferguson Birthdate: 1965/11/09 Sex: male Admission Date (Current Location): 12/28/2018  [facility] [place]  Koleen Nimrod([place]   Facility and Address:  [facility], [address], [place], [place] [address]      Provider Number: [id number]  Attending Physician Name and Address:  Genice RougeLovorn, Megan, MD  Relative Name and Phone Number:       Current Level of Care: Other (Comment)(Acute Inpatient Rehab) Recommended Level of Care: Skilled Nursing Facility Prior Approval Number:    Date Approved/Denied:   PASRR Number:    Discharge Plan: SNF    Current Diagnoses: Patient Active Problem List   Diagnosis Date Noted  . Diarrhea   . Labile blood pressure   . Dysphagia, post-stroke   . 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 Neurogenic bowel   . Neurogenic bladder, flaccid   . Essential hypertension 12/28/2018  . Obesity 12/28/2018  . Spina bifida (HCC) 12/28/2018  . Urinary retention 12/28/2018  . Seizures (HCC) 12/28/2018  . Hypokalemia 12/28/2018  . Intracranial hemorrhage, cerebellar (HCC) 12/28/2018  . Cerebellar bleed (HCC)   . Leukocytosis   . Cytotoxic brain edema (HCC) 12/27/2018  . ICH (intracerebral hemorrhage) (HCC) L cerebellar, etiology ? HTN 12/25/2018    Orientation RESPIRATION BLADDER Height & Weight     Self, Time, Situation, Place  Normal Incontinent(I/O caths needed) Weight: 140 lb 14 oz (63.9 kg) Height:  4\' 9"  (144.8 cm)  BEHAVIORAL SYMPTOMS/MOOD NEUROLOGICAL BOWEL NUTRITION STATUS      Continent Diet(regular)  AMBULATORY STATUS COMMUNICATION OF NEEDS Skin   Extensive Assist(wheelchair level mobility only) Verbally Other (Comment)(MASD to bottom/groin; barrier cream and nystatin as treatment)                       Personal Care Assistance Level of Assistance  Bathing, Dressing  Bathing Assistance: Limited assistance   Dressing Assistance: Limited assistance     Functional Limitations Info             SPECIAL CARE FACTORS FREQUENCY  PT (By licensed PT), OT (By licensed OT)     PT Frequency: 5x/wk OT Frequency: 5x/wk            Contractures Contractures Info: Not present    Additional Factors Info  Code Status, Allergies Code Status Info: Full Allergies Info: see MAR           Current Medications (01/11/2019):  This is the current hospital active medication list Current Facility-Administered Medications  Medication Dose Route Frequency Provider Last Rate Last Dose  . acetaminophen (TYLENOL) tablet 325-650 mg  325-650 mg Oral Q4H PRN Jacquelynn CreeLove, Pamela S, PA-C   650 mg at 01/09/19 0848  . alum & mag hydroxide-simeth (MAALOX/MYLANTA) 200-200-20 MG/5ML suspension 30 mL  30 mL Oral Q4H PRN Jacquelynn CreeLove, Pamela S, PA-C   30 mL at 01/09/19 1656  . atenolol (TENORMIN) tablet 75 mg  75 mg Oral Daily Jacquelynn CreeLove, Pamela S, PA-C   75 mg at 01/11/19 1057  . bisacodyl (DULCOLAX) suppository 10 mg  10 mg Rectal Daily PRN Jacquelynn CreeLove, Pamela S, PA-C   10 mg at 12/28/18 2057  . bisacodyl (DULCOLAX) suppository 10 mg  10 mg Rectal PRN Lovorn, Megan, MD      . diphenhydrAMINE (BENADRYL) 12.5 MG/5ML elixir 12.5-25 mg  12.5-25 mg Oral Q6H PRN Love, Evlyn KannerPamela S,  PA-C      . divalproex (DEPAKOTE ER) 24 hr tablet 750 mg  750 mg Oral Daily Lovorn, Megan, MD   750 mg at 01/11/19 1056  . enoxaparin (LOVENOX) injection 40 mg  40 mg Subcutaneous Q24H Love, Pamela S, PA-C   40 mg at 01/11/19 1415  . guaiFENesin-dextromethorphan (ROBITUSSIN DM) 100-10 MG/5ML syrup 5-10 mL  5-10 mL Oral Q6H PRN Love, Ivan Anchors, PA-C      . HYDROcodone-acetaminophen (NORCO/VICODIN) 5-325 MG per tablet 1 tablet  1 tablet Oral Q6H PRN Lovorn, Megan, MD      . levETIRAcetam (KEPPRA) tablet 1,500 mg  1,500 mg Oral BID Love, Pamela S, PA-C   1,500 mg at 01/11/19 1056  . liver oil-zinc oxide (DESITIN) 40 % ointment   Topical TID  Lovorn, Megan, MD      . modafinil (PROVIGIL) tablet 100 mg  100 mg Oral Daily Lovorn, Megan, MD   100 mg at 01/11/19 1056  . nystatin (MYCOSTATIN/NYSTOP) topical powder   Topical BID Love, Pamela S, PA-C      . polyethylene glycol (MIRALAX / GLYCOLAX) packet 17 g  17 g Oral Daily PRN Love, Pamela S, PA-C      . [START ON 01/12/2019] potassium chloride (K-DUR) CR tablet 10 mEq  10 mEq Oral Daily Lovorn, Megan, MD      . prochlorperazine (COMPAZINE) tablet 5-10 mg  5-10 mg Oral Q6H PRN Bary Leriche, PA-C   10 mg at 12/29/18 1451   Or  . prochlorperazine (COMPAZINE) injection 5-10 mg  5-10 mg Intramuscular Q6H PRN Bary Leriche, PA-C   10 mg at 12/29/18 6546   Or  . prochlorperazine (COMPAZINE) suppository 12.5 mg  12.5 mg Rectal Q6H PRN Love, Pamela S, PA-C      . senna (SENOKOT) tablet 8.6 mg  1 tablet Oral Daily Lovorn, Megan, MD   8.6 mg at 01/11/19 1057  . sodium phosphate (FLEET) 7-19 GM/118ML enema 1 enema  1 enema Rectal Once PRN Love, Ivan Anchors, PA-C      . traMADol Veatrice Bourbon) tablet 50 mg  50 mg Oral Q6H PRN Bary Leriche, PA-C   50 mg at 01/05/19 5035  . traZODone (DESYREL) tablet 25-50 mg  25-50 mg Oral QHS PRN Bary Leriche, PA-C   50 mg at 01/09/19 2120     Discharge Medications: Please see discharge summary for a list of discharge medications.  Relevant Imaging Results:  Relevant Lab Results:   Additional Information SS# [id number]  [address], LCSW

## 2019-01-11 NOTE — Patient Care Conference (Signed)
Inpatient RehabilitationTeam Conference and Plan of Care Update Date: 01/11/2019   Time: 9:40 AM    Patient Name: Carlos Ferguson      Medical Record Number: [id number]  Date of Birth: 1966/02/25 Sex: Male         Room/Bed: 4M10C/4M10C-01 Payor Info: Payor: MEDICARE / Plan: MEDICARE PART A AND B / Product Type: *No Product type* /    Admit Date/Time:  12/28/2018  1:27 PM  Primary Diagnosis:  Intracranial hemorrhage, cerebellar [facility])  Patient Active Problem List   Diagnosis Date Noted  . Diarrhea   . Labile blood pressure   . Dysphagia, post-stroke   . 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 Neurogenic bowel   . Neurogenic bladder, flaccid   . Essential hypertension 12/28/2018  . Obesity 12/28/2018  . Spina bifida ([place]) 12/28/2018  . Urinary retention 12/28/2018  . Seizures ([place]) 12/28/2018  . Hypokalemia 12/28/2018  . Intracranial hemorrhage, cerebellar ([place]) 12/28/2018  . Cerebellar bleed ([place])   . Leukocytosis   . Cytotoxic brain edema ([place]) 12/27/2018  . ICH (intracerebral hemorrhage) (HCC) L cerebellar, etiology ? HTN 12/25/2018    Expected Discharge Date: Expected Discharge Date: (SNF)  Team Members Present: Physician leading conference: Dr. Courtney Heys Social Worker Present: Lennart Pall, LCSW Nurse Present: Other (comment)(Michelle Orhun, LPN) PT Present: Excell Seltzer, PT OT Present: Amy Rounds, OT SLP Present: Stormy Fabian, SLP PPS Coordinator present : Gunnar Fusi, SLP     Current Status/Progress Goal Weekly Team Focus  Bowel/Bladder   Incontinent b/b; LBM: 09/15 bowel program 2x/week; self-cath Q4-6hr  educate pt on self-cathing  assist with tolieting needs prn   Swallow/Nutrition/ Hydration   regular with thin liquids  intermittent supervision (upgraded to progress) - goal met  Goal met   ADL's   Min A slide board transfers, Supervision UB bathing/dressing, mod LB bathing and dressing, total A toileting  following BM, able to self-cath with min A  Downgraded LB dressing to min A, Bathing with supervision bed level, Set-up UB bathing/dressing  ADL re-training, functional transfers, UE strengthening, dynamic sitting balance/endurance   Mobility   S bed mobility with use of hospital bed features, CGA slide board transfer, S w/c mobility, mod A to stand  Supervision overall  transfers, OOB tolerance   Communication   Supervision  Supervision A - goal met  carryover of speech intelligibility strategies   Safety/Cognition/ Behavioral Observations  Min A to supervision  Supervision A  attention, utilization of memory strategies.   Pain   c/o pain to hips and headache; prn tylenol and tramadol  pain level <3/10  asses pain QS and prn   Skin   MASD to bottom/groin; barrier cream and nystatin as treatment  Remain free of new skin breakdown/infection  assess skin QS and prn    Rehab Goals Patient on target to meet rehab goals: Yes *See Care Plan and progress notes for long and short-term goals.     Barriers to Discharge  Current Status/Progress Possible Resolutions Date Resolved   Nursing                  PT  Decreased caregiver support;Medical stability;Home environment access/layout                 OT                  SLP  SW                Discharge Planning/Teaching Needs:  Plan has changed to SNF as father cannot provide needed level of care.  NA   Team Discussion:  Skin on bottom much improved;  HA better?  I/O caths are premorbid and will need to be continued at SNF.  Min A to CGA with sliding board tfs;  CGA with mobility and min to stand.  ADLs at bed level.  Nystagmus is significant and causing dizziness.  On reg diet.  SW reports plan now for SNF.  Revisions to Treatment Plan:  SNF     Medical Summary Current Status: pt has spina bifida- neurognenic bowe and bladder- working on buttocks healing/skin Weekly Focus/Goal: buttocks healing  Barriers to  Discharge: Behavior;Insurance for SNF coverage;Decreased family/caregiver support;Home enviroment Child psychotherapist;Neurogenic Bowel & Bladder;Incontinence;Weight bearing restrictions;Wound care  Barriers to Discharge Comments: n/a Possible Resolutions to Barriers: buttock healing   Continued Need for Acute Rehabilitation Level of Care: The patient requires daily medical management by a physician with specialized training in physical medicine and rehabilitation for the following reasons: Direction of a multidisciplinary physical rehabilitation program to maximize functional independence : Yes Medical management of patient stability for increased activity during participation in an intensive rehabilitation regime.: Yes Analysis of laboratory values and/or radiology reports with any subsequent need for medication adjustment and/or medical intervention. : Yes   I attest that I was present, lead the team conference, and concur with the assessment and plan of the team.   Lennart Pall 01/11/2019, 3:21 PM    Team conference was held via web/ teleconference due to [place] - 19

## 2019-01-11 NOTE — Progress Notes (Signed)
Occupational Therapy Session Note  Patient Details  Name: Carlos Ferguson MRN: [id number] Date of Birth: [birth year]  Today's Date: 01/11/2019 OT Individual Time: 1000-1055 OT Individual Time Calculation (min): 55 min    Short Term Goals: Week 2:  OT Short Term Goal 1 (Week 2): Pt will direct caregiver in set-up of sliding board transfer with no more than 2 questioning cues OT Short Term Goal 2 (Week 2): Pt will don pants from bed level with set-up/supervision using hospital bed functions. OT Short Term Goal 3 (Week 2): Pt will orient and don shirt with set-up assist  Skilled Therapeutic Interventions/Progress Updates:    Pt seen for OT session focusing on ADL re-training and functional transfers. Pt sitting upright in w/c upon arrival, requesting return to bed 2/2 fatigue. Pt willing to participate in therapy if he can return to bed following session.   ADL re-training: Grooming tasks with set-up from w/c level at sink.  Self-cathed while sitting upright in bed with min A and VCs for technique and awareness, RN also present providing education throughout cathing.   Functional Transfers: He self propelled w/c throughout unit with supervision.  In ADL apartment, completed w/c<> low soft surface couch. Min A overall for transfer with assist to sset-up and stabilze equipment. He required max questioning cues for proper set-up of w/c in prep for transfer. Unable to recall when transfer back to bed completed at end of session.  W/C> EOB via sliding board with 4" step underneath feet, CGA overall.   Pt left in supine at end of session, all needs in reach with bed alarm on and RN present.   Therapy Documentation Precautions:  Precautions Precautions: Fall Restrictions Weight Bearing Restrictions: No   Therapy/Group: Individual Therapy  Carlos Ferguson L 01/11/2019, 7:03 AM

## 2019-01-11 NOTE — Progress Notes (Addendum)
Pt performed self catheterization with min assist via clean technique. Only minimal cueing needed.  Bladder scanned pt for 138ml, intermittant cath urine amount 229ml. Larina Earthly, LPN

## 2019-01-11 NOTE — Progress Notes (Signed)
Speech Language Pathology Daily Session Note  Patient Details  Name: Drewey Begue MRN: [id number] Date of Birth: 1965/11/29  Today's Date: 01/11/2019 SLP Individual Time: 7199-4129 SLP Individual Time Calculation (min): 57 min  Short Term Goals: Week 2: SLP Short Term Goal 1 (Week 2): Pt will consume regular diet with minimal overt s/s of aspiration given Mod I for use of compensatory swallow strategies. SLP Short Term Goal 1 - Progress (Week 2): Updated due to goal met SLP Short Term Goal 2 (Week 2): Pt will demonstrate selective attention for 15 minutes with Min A cues. SLP Short Term Goal 3 (Week 2): Pt will complete semi-complex problem solving tasks with Mod A cues. SLP Short Term Goal 4 (Week 2): Pt will utilize speech intelligibility strategies with supervision cues to achieve > 90% speech intelligibility at the conversation level. SLP Short Term Goal 4 - Progress (Week 2): Met SLP Short Term Goal 5 (Week 2): Given Mod A cues, pt will demonstrate use of 1 compensatory memory strategy to recall basic daily information in 5 out of 10 opportunities.  Skilled Therapeutic Interventions:  Skilled treatment session focused on cognition goals. SLP facilitiated session by providing supervision cues to recall list of medications that pt took at baseline (current medications are currently being changed etc to target varying medical issues). Pt also able to recall information regarding POC to discharge to SNF. Pt able to recall general compensatory memory strategy but not able to read memory notebook d/t vision deficits. Pt with improved selective attention for ~ 45 minutes. Pt left upright in bed, all needs within reach. Continue per current plan of care.      Pain Pain Assessment Pain Scale: 0-10 Pain Score: 0-No pain  Therapy/Group: Individual Therapy  Nishawn Rotan 01/11/2019, 2:40 PM

## 2019-01-11 NOTE — Progress Notes (Signed)
Occupational Therapy Note  Patient Details  Name: Carlos Ferguson MRN: [id number] Date of Birth: 1966/03/17  Today's Date: 01/11/2019 OT Missed Time: 83 Minutes Missed Time Reason: Patient fatigue  Pt fast asleep upon arrival. RN reporting pt was exhausted and had just had several staff members in the room. Pt left supine, all needs met. 30 min missed.    Curtis Sites 01/11/2019, 3:55 PM

## 2019-01-11 NOTE — Progress Notes (Signed)
Social Work Patient ID: Carlos Ferguson, male   DOB: 1966/03/14, 53 y.o.   MRN: [id number]   Have spoken with pt and his father about change in plans for SNF and bed search that will take place in [place]/ [place] area closer to pt's home.  Both are pleased with progress and hope SNF will be short term but agreed they still wish to pursue.  Trinady Milewski, LCSW

## 2019-01-11 NOTE — Progress Notes (Signed)
Physical Therapy Session Note  Patient Details  Name: Carlos Ferguson MRN: [id number] Date of Birth: Mar 14, 1966  Today's Date: 01/11/2019 PT Individual Time: 0800-0900 PT Individual Time Calculation (min): 60 min   Short Term Goals: Week 2:  PT Short Term Goal 1 (Week 2): Pt will perform least restrictive transfer with CGA consistently PT Short Term Goal 2 (Week 2): Pt will propel w/c x 150 ft with BUE and CGA PT Short Term Goal 3 (Week 2): Pt will tolerate sitting up in w/c x 2 hours  Skilled Therapeutic Interventions/Progress Updates:    Pt received seated in bed, agreeable to PT session. Pt reports ongoing headache and dizziness this AM, 2/10. Pt declines any intervention for headache as pain medication makes him "groggy". Pt is min A to don BLE braces while long-sitting in bed. Pt is max A to don pants in long-sitting then min A for rolling L/R with bedrails to pull pants up over bottom. Supine to sit with close SBA with HOB elevated. Slide board transfer bed to w/c with CGA. Manual w/c propulsion x 150 ft with use of BUE and Supervision. Sit to stand x 3 reps to stairs with BUE support on handrails and min A to stand. Pt is able to tolerate standing ~30 sec for first rep then ~15 sec for 2nd two standing trials. Pt reports ongoing dizziness throughout session but does not increase from 2/10. Pt also initially states he has blurry vision which is new this date but then later says he has had blurry vision since his CVA. Pt has exhibited nystagmus with position changes since onset of CVA. Pt is setup A to brush his teeth while seated in w/c at sink. Pt left seated in w/c in room with needs in reach, quick release belt and chair alarm in place at end of session.  Therapy Documentation Precautions:  Precautions Precautions: Fall Restrictions Weight Bearing Restrictions: No     Therapy/Group: Individual Therapy   Excell Seltzer, PT, DPT  01/11/2019, 12:38 PM

## 2019-01-11 NOTE — Discharge Summary (Signed)
Physician Discharge Summary  Patient ID: Carlos Ferguson MRN: [id number] DOB/AGE: [birth year] 53 y.o.  Admit date: 12/28/2018 Discharge date: 02/02/2019  Discharge Diagnoses:  Principal Problem:   Intracranial hemorrhage, cerebellar (HCC) Active Problems:   Spina bifida (HCC)   Hypokalemia   Vertigo due to and not concurrent with hemorrhagic cerebrovascular accident (CVA)   Neurogenic bowel   Neurogenic bladder, flaccid   Dysphagia, post-stroke   Frequent headaches   Vertigo   Frequent UTI   Discharged Condition: stable   Significant Diagnostic Studies: No results found.  Labs:  Basic Metabolic Panel: BMP Latest Ref Rng & Units 01/30/2019 01/26/2019 01/19/2019  Glucose 70 - 99 mg/dL 409(W102(H) 98 91  BUN 6 - 20 mg/dL 9 15 12   Creatinine 0.61 - 1.24 mg/dL 1.190.62 1.470.95 8.290.72  Sodium 135 - 145 mmol/L 141 139 139  Potassium 3.5 - 5.1 mmol/L 3.6 5.0 4.2  Chloride 98 - 111 mmol/L 103 103 106  CO2 22 - 32 mmol/L 30 28 25   Calcium 8.9 - 10.3 mg/dL 9.0 9.3 5.6(O8.7(L)    CBC: CBC Latest Ref Rng & Units 01/30/2019 01/26/2019 01/19/2019  WBC 4.0 - 10.5 K/uL 4.6 5.3 4.6  Hemoglobin 13.0 - 17.0 g/dL 13.014.0 86.514.6 78.414.1  Hematocrit 39.0 - 52.0 % 41.1 42.2 39.6  Platelets 150 - 400 K/uL 190 185 160     CBG: No results for input(s): GLUCAP in the last 168 hours.  Brief HPI:   Carlos BickersGregory Ferguson is a [age] y.o. male with history of spina bifida, Chiari malformation s/p shunt, multiple BLE surgeries with right hip degeneration who was admitted on 12/25/18 with 4 day history of N/V and headaches. CT head done revealing 3 cm left cerebellar hematoma with severe hydrocephalus slighlty increased from 2010 an fracture of shunt catheter noted. MRI brain showed left cerebellar hemorrhage without underlying amyloid angiopathy or hypertensive disease. He was started on IV cleveprex as bleed felt to be hypertensive in nature. Dr. Danielle DessElsner felt that hydrocephalus as not of concern and no surgery indicated. Hospital course  significant for hypokalemia, leucocytosis ongoing issue with HA and episode of N/V on 12/27/18. Repeat CT head showed no significant changes.  Patient was independent and living alone PTA.  Therapy ongoing and patient was noted to have deficits in mobility as well as ability to perform ADLs.    Hospital Course: Carlos Ferguson was admitted to rehab 12/28/2018 for inpatient therapies to consist of PT, ST and OT at least three hours five days a week. Past admission physiatrist, therapy team and rehab RN have worked together to provide customized collaborative inpatient rehab. KUB was negative for obstipation or obstruction and bowel program has been adjusted to help manage neurogenic bowel. He is on Senna daily with suppository on Mon/Thur to help with bowel function. Neurogenic bladder is being managed with I/O caths with patient being cued to performing catherizations. He has been treated for enterococcus UTI and most recent (culture of 9/21) for Klebsiella pneumonia UTI with septra DS X 7 days. In the past few days, he has developed incontinence despite frequent I/O caths and was started on Oxyntin tid per input from GU. UA/UCS also ordered 10/7 to rule out recurrent UTI as cause of incontinence. UA positive for UTI and septra DS started empirically pending culture results. We will contact SNF with results once finalized. MASD on sacral area is being treated with zinc oxide tid and fungal rash peri area is managed with nystatin bid.    Serial CBC showed that leucocytosis  has resolved and H/H is relatively stable. Hypokalemia has resolved with supplementation and K dur was decreased to 10 meq daily on 9/17.  He continued to report headaches and was started on Depakote which was titrated upwards for symptom management. He has been seizure free on home dose Keppra. Back incision is C/D/I and is healing well. He was maintained on modified diet at admission and as dysphagia resolved, he was advanced to regular  textures, thin liquids. He continued to have problems with dizziness and therapy sessions were broken up with rest breaks in between to help manage symptoms. Scopolamine patch was added with some improvement in symptoms. Blood pressures were monitored on TID basis and is controlled on atenolol daily.  He has made gains during rehab stay but continues to be limited by vestibular deficits, weakness and STM deficits. He continues to requires min assist and family is unable to provide care needed. SNF was elected for progressive therapy and he was discharged to    Rehab course: During patient's stay in rehab weekly team conferences were held to monitor patient's progress, set goals and discuss barriers to discharge. At admission, patient required max assist with basic self care tasks and max to total assist with mobility.  He was lethargic at admission and required max to total cues for sequencing and max assist for arousal. He demonstrated moderate dysarthria and moderate dysphagia requiring increased time for mastication and to swallow therefore diet was downgraded to dysphagia 2/thins liquids via cup.  He  has had improvement in activity tolerance, balance, postural control as well as ability to compensate for deficits. He is able to complete bathing and dressing in long sitting with set up assist and supervision. He requires min assist for LB dressing and for toileting. He requires supervision with bed mobility, supervision to CGA for SB transfers, min assist with sit to stand transfers and is able to ambulate 7' with RW and +2 assist. He is able to propel his wheelchair 150' with supervision. He requires supervision to min assist with cognitive tasks. He is tolerating regular diet, thins without s/s of aspiration and is able to utilize safe swallow strategies independently. He requires supervision to min assist for basic cognitive tasks but continues to have severe short term memory deficits and impairments with  semi-complex tasks.   Disposition: [facility].   Diet:  Regular  Special Instructions: 1. Continue I/O cath every 4-6 hours to keep bladder volumes < 200 cc.  2. Due to neurogenic bowel needs to continue bowel regimen of  Senna one tab daily followed by dulcolax suppository every Monday and Friday.    Discharge Instructions    Ambulatory referral to Physical Medicine Rehab   Complete by: As directed    4 week follow up post stroke/hx of spina bifida.     Allergies as of 02/02/2019      Reactions   Latex Anaphylaxis, Hives   Penicillins Hives, Itching      Medication List    STOP taking these medications   Chlorhexidine Gluconate Cloth 2 % Pads   heparin 5000 UNIT/ML injection   labetalol 5 MG/ML injection Commonly known as: NORMODYNE   potassium chloride 20 MEQ packet Commonly known as: KLOR-CON   senna-docusate 8.6-50 MG tablet Commonly known as: Senokot-S     TAKE these medications   acetaminophen 500 MG tablet Commonly known as: TYLENOL Take 1,000 mg by mouth every 6 (six) hours as needed for mild pain.   atenolol 25 MG  tablet Commonly known as: TENORMIN Take 3 tablets (75 mg total) by mouth daily. What changed: Another medication with the same name was removed. Continue taking this medication, and follow the directions you see here.   divalproex 500 MG 24 hr tablet Commonly known as: DEPAKOTE ER Take 1 tablet (500 mg total) by mouth daily.   flavoxATE 100 MG tablet Commonly known as: URISPAS Take 1 tablet (100 mg total) by mouth 3 (three) times daily as needed for bladder spasms.   levETIRAcetam 500 MG tablet Commonly known as: KEPPRA Take 1,500 mg by mouth 2 (two) times daily.   liver oil-zinc oxide 40 % ointment Commonly known as: DESITIN Apply topically 3 (three) times daily.   modafinil 100 MG tablet Commonly known as: PROVIGIL Take 1 tablet (100 mg total) by mouth daily.   nystatin powder Commonly known as:  MYCOSTATIN/NYSTOP Apply topically 2 (two) times daily.   oxybutynin 5 MG tablet Commonly known as: DITROPAN Take 1 tablet (5 mg total) by mouth 3 (three) times daily.   pseudoephedrine 30 MG tablet Commonly known as: SUDAFED Take 1 tablet (30 mg total) by mouth every 6 (six) hours as needed for congestion.   scopolamine 1 MG/3DAYS Commonly known as: TRANSDERM-SCOP Place 1 patch (1.5 mg total) onto the skin every 3 (three) days.   senna 8.6 MG Tabs tablet Commonly known as: SENOKOT Take 1 tablet (8.6 mg total) by mouth daily.   sulfamethoxazole-trimethoprim 800-160 MG tablet Commonly known as: BACTRIM DS Take 1 tablet by mouth every 12 (twelve) hours.        Signed: Jacquelynn Cree 02/02/2019, 9:53 AM

## 2019-01-12 ENCOUNTER — Inpatient Hospital Stay (HOSPITAL_COMMUNITY): Payer: Medicare Other

## 2019-01-12 ENCOUNTER — Inpatient Hospital Stay (HOSPITAL_COMMUNITY): Payer: Medicare Other | Admitting: Speech Pathology

## 2019-01-12 ENCOUNTER — Inpatient Hospital Stay (HOSPITAL_COMMUNITY): Payer: Medicare Other | Admitting: Occupational Therapy

## 2019-01-12 DIAGNOSIS — R51 Headache: Secondary | ICD-10-CM

## 2019-01-12 NOTE — Progress Notes (Signed)
[facility] PHYSICAL MEDICINE & REHABILITATION PROGRESS NOTE   Subjective/Complaints: Patient seen laying prone in bed this morning.  He states he did not sleep well overnight due to right eye pain, which resolved with medications, but now he feels lethargic as result of taking medications..   ROS: Denies CP, shortness of breath, nausea, vomiting.  Objective:   No results found. Recent Labs    01/11/19 0548  WBC 6.6  HGB 14.9  HCT 44.9  PLT 245   Recent Labs    01/11/19 0548  NA 142  K 5.0  CL 107  CO2 27  GLUCOSE 102*  BUN 10  CREATININE 0.77  CALCIUM 8.8*    Intake/Output Summary (Last 24 hours) at 01/12/2019 1144 Last data filed at 01/12/2019 0807 Gross per 24 hour  Intake 600 ml  Output 665 ml  Net -65 ml     Physical Exam: Vital Signs Blood pressure (!) 142/80, pulse 65, temperature 98.2 F (36.8 C), temperature source Oral, resp. rate 17, height 4\' 9"  (1.448 m), weight 65.6 kg, SpO2 99 %. Constitutional: No distress . Vital signs reviewed. HENT: Normocephalic.  Atraumatic. Eyes: EOMI. No discharge. Cardiovascular: No JVD. Respiratory: Normal effort.  No stridor. GI: Non-distended. Skin: Healed incisions Psych: Normal mood.  Normal behavior. Musc: Lower extremity contractures  Neurological: Alert Dysarthria Able to follow commands Motor: Bilateral upper extremities: 4+/5 proximal distal, stable No movement noted in bilateral lower extremities Sensation reduced in BLE   Assessment/Plan: 1. 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  Physiatrist is providing close team supervision and 24 hour management of active medical problems listed below.  Physiatrist and rehab team continue to assess barriers to discharge/monitor  patient progress toward functional and medical goals  Care Tool:  Bathing    Body parts bathed by patient: Right arm, Left arm, Chest, Abdomen, Right upper leg, Left upper leg, Right lower leg, Left lower leg, Face, Front perineal area   Body parts bathed by helper: Buttocks     Bathing assist Assist Level: Contact Guard/Touching assist     Upper Body Dressing/Undressing Upper body dressing   What is the patient wearing?: Pull over shirt    Upper body assist Assist Level: Supervision/Verbal cueing    Lower Body Dressing/Undressing Lower body dressing      What is the patient wearing?: Incontinence brief, Pants     Lower body assist Assist for lower body dressing: Moderate Assistance - Patient 50 - 74%     Toileting Toileting    Toileting assist Assist for toileting: Dependent - Patient 0%     Transfers Chair/bed transfer  Transfers assist  Chair/bed transfer activity did not occur: Safety/medical concerns  Chair/bed transfer assist level: Contact Guard/Touching assist     Locomotion Ambulation   Ambulation assist   Ambulation activity did not occur: Safety/medical concerns          Walk 10 feet activity   Assist  Walk 10 feet activity did not occur: Safety/medical concerns        Walk 50 feet activity   Assist Walk 50 feet with 2 turns activity did not occur: Safety/medical concerns         Walk 150 feet activity   Assist Walk 150 feet activity did not occur: Safety/medical concerns  Walk 10 feet on uneven surface  activity   Assist Walk 10 feet on uneven surfaces activity did not occur: Safety/medical concerns         Wheelchair     Assist Will patient use wheelchair at discharge?: Yes Type of Wheelchair: Manual Wheelchair activity did not occur: Safety/medical concerns  Wheelchair assist level: Supervision/Verbal cueing Max wheelchair distance: 150'    Wheelchair 50 feet with 2 turns  activity    Assist    Wheelchair 50 feet with 2 turns activity did not occur: Safety/medical concerns   Assist Level: Supervision/Verbal cueing   Wheelchair 150 feet activity     Assist  Wheelchair 150 feet activity did not occur: Safety/medical concerns   Assist Level: Supervision/Verbal cueing   Blood pressure (!) 142/80, pulse 65, temperature 98.2 F (36.8 C), temperature source Oral, resp. rate 17, height 4\' 9"  (1.448 m), weight 65.6 kg, SpO2 99 %.   Medical Problem List and Plan: 1.  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  Antithrombotics: -DVT/anticoagulation:  Mechanical: Foot pump Bilateral lower extremities             -antiplatelet therapy: N/A due to bleed 3. Headaches/Pain Management: Added Ultram prn.   Increased Depakote ER to 750 mg daily and added norco for when tylneol and tramadol aren't effective.   Added 2 lidoderm patches 8pm to 8am daily for neck tightness, later received per patient preference 4. Mood: LCSW to follow for evaluation and support.              -antipsychotic agents: N/A 5. Neuropsych: This patient is fully capable of making decisions on his own behalf. 6. Skin/Wound Care: Routine pressure relief measures.  7. Fluids/Electrolytes/Nutrition: Monitor I/Os. Tolerating current diet.  8. Headaches: Persistent but appears to be improving.  Depakote added.  See #3 9. Leucocytosis: Resolved.    WBC 6.6 on 9/16  Continue to monitor 10. Hypokalemia: Continue daily supplement and adjust as needed.    Potassium 5.0 on 9/16  Potassium supplement decreased   11. HTN: Monitor BP tid--continue atenolol               Vitals:   01/11/19 1916 01/12/19 0543  BP: 115/80 (!) 142/80  Pulse: 88 65  Resp: 17 17  Temp: 98.9 F (37.2 C) 98.2 F (36.8 C)  SpO2: 98% 99%   Slightly labile, but overall controlled on 9/17 12. Seizure disorder: Continue Keppra 1500 mg  bid.  Monitor reoccurrence. 15. 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 16. Attention/fatigue trial Provigil since doesn't raise BP as much 100 mg daily- can likely send out on Ritalin 17. Vertigo/dizziness-continue to follow 18. R hip dysplasia- was due to go to [facility] for surgery-on hold at present 19.  Post stroke dysphagia:   Advance to regular texture, thins on 9/11  LOS: 15 days A FACE TO FACE EVALUATION WAS PERFORMED  Carlos Ferguson [name] 01/12/2019, 11:44 AM

## 2019-01-12 NOTE — Plan of Care (Signed)
  Problem: RH BOWEL ELIMINATION Goal: RH STG MANAGE BOWEL WITH ASSISTANCE Description: STG Manage Bowel with max Assistance. Outcome: Not Progressing; bowel program   Problem: RH BLADDER ELIMINATION Goal: RH STG MANAGE BLADDER WITH ASSISTANCE Description: STG Manage Bladder With mod Assistance Outcome: Not Progressing; I and o cath

## 2019-01-12 NOTE — Progress Notes (Signed)
Physical Therapy Session Note  Patient Details  Name: Carlos Ferguson MRN: [id number] Date of Birth: 09-Feb-1966  Today's Date: 01/12/2019 PT Individual Time: 1300-1350 PT Individual Time Calculation (min): 50 min   Short Term Goals: Week 1:  PT Short Term Goal 1 (Week 1): Pt will maintain sitting balance EOB with mod A PT Short Term Goal 1 - Progress (Week 1): Met PT Short Term Goal 2 (Week 1): Pt will complete least restrictive transfer with assist x 1 consistently PT Short Term Goal 2 - Progress (Week 1): Met PT Short Term Goal 3 (Week 1): Pt will initiate w/c mobility PT Short Term Goal 3 - Progress (Week 1): Met Week 2:  PT Short Term Goal 1 (Week 2): Pt will perform least restrictive transfer with CGA consistently PT Short Term Goal 2 (Week 2): Pt will propel w/c x 150 ft with BUE and CGA PT Short Term Goal 3 (Week 2): Pt will tolerate sitting up in w/c x 2 hours Week 3:     Skilled Therapeutic Interventions/Progress Updates:     Pain: Reports hips 3/10 and at baseline for him.  Pt initially supine in bed w/leg braces on.  States he does not feel well today but unable to specify.  Appears "washed out".   Denies dizzyness but states he feels "off".  Vitals assessed and all WNL's. Pt rolled L/R 4x using rails for therapist to assist w/donning jeans/max assist.  Supine to sitting on edge of bed w/hob elevated w/supervision.  Static sit on edge of bed propping w/UE's x 2-3 min w/cga.  Bed to wc via sliding board w/max assist for placing board, total assist for set up of wc/stool.  Verbal instruction required prior to transfer, pt unable to recall process for SB transfer today.   Sliding board transfer w/min assist to prevent sliding anteriorly on board and verbal cues for sequencing.  wc propulsion 150f mod I w/UE's.  UBE x 5 min for cardiovascular conditioning and UE strengthening.  Pt unable to continue and stated he needed to go back to bed.    wc to bed via sliding board  transfer w/min assist, again requiring verbal instruction and max assist  for set up/board placement, sequencing.   Sit to supine w/mod assist for LE management.  Nursing in and reassessed vitals which remain WNL's  Spoke w/nurse regarding decreased overall appearance and performance.  Pt explained not feeling well.   Jeans and braces removed by PT max assist, pt partially rolling 1x each direction using rails.  Pt left in bed w/alarm set, rails up x 3, nurse in room w/pt.  Assessment:  Pt w/significantly decreased activity tolerance, seems less mentally clear, requiring increased instruction and assist w/transfers today.     Therapy Documentation Precautions:  Precautions Precautions: Fall Restrictions Weight Bearing Restrictions: No    Therapy/Group: Individual Therapy  BCallie Fielding PT  01/12/2019, 4:05 PM

## 2019-01-12 NOTE — Progress Notes (Signed)
Physical Therapy Weekly Progress Note  Patient Details  Name: Carlos Ferguson MRN: [id number] Date of Birth: 04/13/1966  Beginning of progress report period: January 05, 2019 End of progress report period: January 12, 2019  Today's Date: 01/12/2019  Patient has met 2 of 3 short term goals.  Pt continues to make slow but steady progress towards rehab goals. Pt is overall Supervision to min A for bed mobility, CGA for slide board transfers, and Supervision for w/c mobility. Pt's progress is limited at times by ongoing headaches and dizziness/blurry vision from CVA. Pt exhibits good motivation and participation in therapy sessions. Patient's dad completed family training and was able to observe therapy sessions to determine if he could provide level of care pt will require upon d/c. Unfortunately he will not be able to provide the level of assist pt will require so it is recommended pt d/c to SNF to continue to work towards becoming more independent and reaching a level at which his family can help him at home.  Patient continues to demonstrate the following deficits muscle weakness, decreased visual perceptual skills, decreased initiation, decreased attention, decreased awareness, decreased problem solving, decreased safety awareness, decreased memory and delayed processing and decreased sitting balance, decreased standing balance, decreased postural control and decreased balance strategies and therefore will continue to benefit from skilled PT intervention to increase functional independence with mobility.  Patient progressing toward long term goals..  Continue plan of care.  PT Short Term Goals Week 2:  PT Short Term Goal 1 (Week 2): Pt will perform least restrictive transfer with CGA consistently PT Short Term Goal 1 - Progress (Week 2): Met PT Short Term Goal 2 (Week 2): Pt will propel w/c x 150 ft with BUE and CGA PT Short Term Goal 2 - Progress (Week 2): Met PT Short Term Goal 3 (Week 2):  Pt will tolerate sitting up in w/c x 2 hours PT Short Term Goal 3 - Progress (Week 2): Progressing toward goal Week 3:  PT Short Term Goal 1 (Week 3): =LTG due to ELOS   Therapy Documentation Precautions:  Precautions Precautions: Fall Restrictions Weight Bearing Restrictions: No   Therapy/Group: Individual Therapy   Excell Seltzer, PT, DPT 01/12/2019, 4:12 PM

## 2019-01-12 NOTE — Progress Notes (Signed)
Speech Language Pathology Daily Session Note  Patient Details  Name: Cordarious Zeek MRN: [id number] Date of Birth: May 12, 1965  Today's Date: 01/12/2019 SLP Missed Time: 53 Minutes Missed Time Reason: Patient fatigue, pt sleeping, he requested to remain asleep   Pain Pain Assessment Pain Score: 0-No pain  Therapy/Group: Individual Therapy  Krystle Polcyn 01/12/2019, 11:43 AM

## 2019-01-12 NOTE — Progress Notes (Signed)
Occupational Therapy Weekly Progress Note  Patient Details  Name: Carlos Ferguson MRN: [id number] Date of Birth: Jun 05, 1965  Beginning of progress report period: January 05, 2019 End of progress report period: January 12, 2019  Today's Date: 01/12/2019 OT Individual Time: 3762-8315 OT Individual Time Calculation (min): 48 min  and Today's Date: 01/12/2019 OT Missed Time: 12 Minutes Missed Time Reason: Patient fatigue   Patient has met 2 of 3 short term goals.  Pt cont to make slow but steady progress towaeds OT goals. He completes ADLs from long sitting position with set-up/supervision overall, assist to wash buttock and fully advance pants over hips. He cont to be most limited by decreased functional activity tolerance and headaches/ dizziness with all mobility, MD aware.  Pt's father attended family ed session, he will be unable to provide the physical and cognitive assist at d/c and therefore d/c disposition changed to SNF in order to cont to decrease caregiver burden and increase independence with ADLs.   Patient continues to demonstrate the following deficits: muscle weakness,decreased cardiorespiratoy endurance,central originand decreased sitting balance, decreased standing balance, decreased postural control and decreased balance strategies  and therefore will continue to benefit from skilled OT intervention to enhance overall performance with BADL and Reduce care partner burden.  Patient progressing toward long term goals..  Continue plan of care.  OT Short Term Goals Week 2:  OT Short Term Goal 1 (Week 2): Pt will direct caregiver in set-up of sliding board transfer with no more than 2 questioning cues OT Short Term Goal 1 - Progress (Week 2): Not met OT Short Term Goal 2 (Week 2): Pt will don pants from bed level with set-up/supervision using hospital bed functions. OT Short Term Goal 2 - Progress (Week 2): Met OT Short Term Goal 3 (Week 2): Pt will orient and don shirt with  set-up assist OT Short Term Goal 3 - Progress (Week 2): Met Week 3:  OT Short Term Goal 1 (Week 3): STG=LTG awaiting SNF placement  Skilled Therapeutic Interventions/Progress Updates:    Pt seen for OT ADL bathing/dressing session. Pt in prone upon arrival, complaints rough night of sleep 2/2 headache which is now resolved. Pt willing to participate as able though required significantly increased time and rest breaks throughout. Completed bathing/dressing routine from long sitting position with set-up/supervision. Used bed rails to assist with supine>sitting upright with supervision and increased time. Assist to wash buttocks, don new brief and assist in advancing pants over hips while rolling from side to side to pull up. Pt requested total A to don socks, braces and shoes 2/2 fatigue. He declined any further mobility/treatment once ADLs were complete. Desired to stay bed level to cont to rest.  Pt left in supine with all needs in reach and bed alarm on.   Therapy Documentation Precautions:  Precautions Precautions: Fall Restrictions Weight Bearing Restrictions: No   Therapy/Group: Individual Therapy  Xiara Knisley L 01/12/2019, 6:57 AM

## 2019-01-13 ENCOUNTER — Inpatient Hospital Stay (HOSPITAL_COMMUNITY): Payer: Medicare Other

## 2019-01-13 ENCOUNTER — Inpatient Hospital Stay (HOSPITAL_COMMUNITY): Payer: Medicare Other | Admitting: Occupational Therapy

## 2019-01-13 ENCOUNTER — Inpatient Hospital Stay (HOSPITAL_COMMUNITY): Payer: Medicare Other | Admitting: Speech Pathology

## 2019-01-13 MED ORDER — SCOPOLAMINE 1 MG/3DAYS TD PT72
1.0000 | MEDICATED_PATCH | TRANSDERMAL | Status: DC
  Administered 2019-01-13 – 2019-01-28 (×7): 1.5 mg via TRANSDERMAL
  Filled 2019-01-13 (×7): qty 1

## 2019-01-13 NOTE — Progress Notes (Signed)
Physical Therapy Session Note  Patient Details  Name: Carlos Ferguson MRN: [id number] Date of Birth: 05/28/1965  Today's Date: 01/13/2019 PT Individual Time: 0921-1014 PT Individual Time Calculation (min): 53 min   Short Term Goals: Week 1:  PT Short Term Goal 1 (Week 1): Pt will maintain sitting balance EOB with mod A PT Short Term Goal 1 - Progress (Week 1): Met PT Short Term Goal 2 (Week 1): Pt will complete least restrictive transfer with assist x 1 consistently PT Short Term Goal 2 - Progress (Week 1): Met PT Short Term Goal 3 (Week 1): Pt will initiate w/c mobility PT Short Term Goal 3 - Progress (Week 1): Met Week 2:  PT Short Term Goal 1 (Week 2): Pt will perform least restrictive transfer with CGA consistently PT Short Term Goal 1 - Progress (Week 2): Met PT Short Term Goal 2 (Week 2): Pt will propel w/c x 150 ft with BUE and CGA PT Short Term Goal 2 - Progress (Week 2): Met PT Short Term Goal 3 (Week 2): Pt will tolerate sitting up in w/c x 2 hours PT Short Term Goal 3 - Progress (Week 2): Progressing toward goal Week 3:  PT Short Term Goal 1 (Week 3): =LTG due to ELOS  Skilled Therapeutic Interventions/Progress Updates:   Pain:  Pt denies pain this am     Pt initially supine in bed. Therapist assisted pt w/donning leg braces w/max assist required, jeans dependent but pt able to roll L and R w/rail w/mod assist to complete lower body transition. Long Sit w/hob elevated to sitting on edge of bed w/supervision, pt props w/UE's for balance. Sitting balance on edge of bed - without UE support pt requires min assist, props w/supervision.  Pt able to remove unclean shirt and donn clean shirt w/set up and min assist for dressing, min assist for balance in sitting.  Sliding board transfer bed to wc w/set up, therapist placing board, use of 4in step under feet, cga and verbal cues for transfer.  wc pushups x 6 for triceps strengthening.   wc propulsion 165f w/bilat UE/s and  supervision.  Sit to stand using stair rails for height/stability and mod assist to achieve standing, mod to max to maintain, max assist for stand to sit.  Able to scoot in chair/boost mod I.  Pt stands w/heavy reliance on forearms and flexed at hips which he states is his normal posture w/crutches.  Poor stability at hips.  Stood 45 sec, 30sec x 3 w/mod assist.  Pt stated he thought he had diarreah and requested return to bed to check.  wc to bed via sliding board as above. Sit to supine using rails and hob elevated w/supervision.  Dependent for removal of pants and R brace, max assist w/L brace.  Step by step verbal instruction for scooting up in bed, pt unable to plan without direct cues.  Pt rolled semi prone w/mod assist for lower body.  Pt brief not soiled.  Pt left in bed w/rails up x 3, needs in reach, alarm set.  Improved ability to tolerate activity today vs yesterday.  Limited progress w/standing tolerance/balance.  Depends entirely on UE's w/sliding board transfers.    Therapy Documentation Precautions:  Precautions Precautions: Fall Restrictions Weight Bearing Restrictions: No PAIN:   Therapy/Group: Individual Therapy  BJerrilyn Cairo9/18/2020, 10:46 AM

## 2019-01-13 NOTE — Progress Notes (Signed)
Speech Language Pathology Weekly Progress and Session Note  Patient Details  Name: Carlos Ferguson MRN: [id number] Date of Birth: [birth year]  Beginning of progress report period: January 06, 2019 End of progress report period: January 13, 2019  Today's Date: 01/13/2019 SLP Individual Time: 1101-1200 SLP Individual Time Calculation (min): 59 min  Short Term Goals: Week 2: SLP Short Term Goal 1 (Week 2): Pt will consume regular diet with minimal overt s/s of aspiration given Mod I for use of compensatory swallow strategies. SLP Short Term Goal 1 - Progress (Week 2): Met SLP Short Term Goal 2 (Week 2): Pt will demonstrate selective attention for 15 minutes with Min A cues. SLP Short Term Goal 2 - Progress (Week 2): Met SLP Short Term Goal 3 (Week 2): Pt will complete semi-complex problem solving tasks with Mod A cues. SLP Short Term Goal 3 - Progress (Week 2): Met SLP Short Term Goal 4 (Week 2): Pt will utilize speech intelligibility strategies with supervision cues to achieve > 90% speech intelligibility at the conversation level. SLP Short Term Goal 4 - Progress (Week 2): Met SLP Short Term Goal 5 (Week 2): Given Mod A cues, pt will demonstrate use of 1 compensatory memory strategy to recall basic daily information in 5 out of 10 opportunities. SLP Short Term Goal 5 - Progress (Week 2): Progressing toward goal    New Short Term Goals: Week 3: SLP Short Term Goal 1 (Week 3): Pt will complete semi-complex problem solving tasks with Min A cues. SLP Short Term Goal 2 (Week 3): Given Mod A cues, pt will demonstrate use of 1 compensatory memory strategy to recall basic daily information in 5 out of 10 opportunities. SLP Short Term Goal 3 (Week 3): Pt will utilize memory notebook to answer basic [place] questions regarding information in 5 out of 10 opportunities with Min A cues. SLP Short Term Goal 4 (Week 3): Pt will demonstrate selective attention to task for ~ 30 minutes with min A cues for  redirection.  Weekly Progress Updates: Overall pt has made progress throughout this reporting period despite having missed some of therapy d/t lethargy. Pt has made progress in using compensatory swallow strategies when consuming regular diet and using speech intelligibility strategies to achieve ~ 100% speech intelligibility at the conversation level. Anticipate that pt is likely to continue to have deficits in memory and is able to utilize some memory strategies to gain improved independence with his care. Pt would benefit from further interdisciplinary therapies at SNF.      Intensity: Minumum of 1-2 x/day, 30 to 90 minutes Frequency: 3 to 5 out of 7 days Duration/Length of Stay: pending SNF placement Treatment/Interventions: Cognitive remediation/compensation;Internal/external aids;Cueing hierarchy;Environmental controls;Patient/family education;Functional tasks;Therapeutic Activities   Daily Session  Skilled Therapeutic Interventions: Skilled treatment session focused on cognition goals. SLP received pt supine in bed with overall fatigue and lethargy. SLP facilitated by providing cues for sustained attention to tasks in 15 minutes with rest breaks allowed. SLP also utilized Social worker for recall of sessions in day. Pt and SLP engaged in review of current ST POC and STG/LTGs. Pt able to contribute to POC and is in agreement with current goals. Pt left in bed, bed alarm on and pt asleep.      General    Pain Pain Assessment Pain Scale: 0-10 Pain Score: 0-No pain  Therapy/Group: Individual Therapy  Donnovan Stamour 01/13/2019, 11:25 AM

## 2019-01-13 NOTE — Progress Notes (Signed)
Occupational Therapy Session Note  Patient Details  Name: Carlos Ferguson MRN: [id number] Date of Birth: Aug 25, 1965  Today's Date: 01/13/2019 OT Individual Time: 1305-1420 OT Individual Time Calculation (min): 75 min    Short Term Goals: Week 3:  OT Short Term Goal 1 (Week 3): STG=LTG awaiting SNF placement  Skilled Therapeutic Interventions/Progress Updates:    Pt seen for OT session focusing on ADL re-training and functional mobility. Pt awake setting up in bed eating lunch, agreeable to tx session and denying pain. He donned pants from long sitting position, supervision with increased time to thread LEs into pants. Required combination of rolling and lateral leans in order to have therapist assist in pulling pants up. Pt did not recall getting dressed in this manner before, despite dressing like this each day he has been on rehab. Pt only able to problem solve pulling pants up via standing as he did PTA. He was able to don shoes with min A.  Completed supervision level sliding board transfer to w/c with assist to place board.  Grooming tasks completed from w/c level at sink mod I.      Worked with pt in picking out meal items from [employer] that he would like. Pt placing menu R of midline when reading, reports L eye blurry. Addressed STM having pt remember the 3 food items selected in order to place order with cafeteria staff. Pt initially requiring max cuing for immediate recall, however, following cues to assist in recall, he was able to independently convey order to cafeteria staff. In ADL apartment, pt completed simulated tub/shower transfer utilizing tub bench. Pt cont with poor insight into deficits/ functional problem solving. Unable to understand why using tub transfer bench and why he wouldn't be able to just get in/out of standard bathtub.  Max A for sliding board transfer to tub bench, steep transfer up onto bench with max multi-modal cuing for technique. Min A to return back to  w/c. Unsure if this transfer would be safe/feasible when completed with pt unclothed on wet/slick surfaces. Cont to problem solve safest method for shower level bathing. Pt returned to room at end of session, left seated in w/c with all needs in reach and safety belt donned.   Therapy Documentation Precautions:  Precautions Precautions: Fall Restrictions Weight Bearing Restrictions: No     Therapy/Group: Individual Therapy  Lileigh Fahringer L 01/13/2019, 6:55 AM

## 2019-01-13 NOTE — Progress Notes (Signed)
[facility] PHYSICAL MEDICINE & REHABILITATION PROGRESS NOTE   Subjective/Complaints: Patient seen sitting up in bed this morning.  He states he slept well overnight.  He states he had an enema with results.  Does complain of persistent vertigo.   ROS: + Dizziness.  Denies CP, shortness of breath, nausea, vomiting.  Objective:   No results found. Recent Labs    01/11/19 0548  WBC 6.6  HGB 14.9  HCT 44.9  PLT 245   Recent Labs    01/11/19 0548  NA 142  K 5.0  CL 107  CO2 27  GLUCOSE 102*  BUN 10  CREATININE 0.77  CALCIUM 8.8*    Intake/Output Summary (Last 24 hours) at 01/13/2019 1015 Last data filed at 01/13/2019 0557 Gross per 24 hour  Intake 160 ml  Output 1075 ml  Net -915 ml     Physical Exam: Vital Signs Blood pressure 138/77, pulse 76, temperature 98.9 F (37.2 C), temperature source Oral, resp. rate 16, height 4\' 9"  (1.448 m), weight 65.6 kg, SpO2 97 %. Constitutional: No distress . Vital signs reviewed. HENT: Normocephalic.  Atraumatic. Eyes: EOMI. No discharge. Cardiovascular: No JVD. Respiratory: Normal effort.  No stridor. GI: Non-distended. Skin: Healed incisions. Psych: Normal mood.  Normal behavior. Musc: Lower extremity contractures. Neurological: Alert Dysarthria Able to follow commands Motor: Bilateral upper extremities: 4+/5 proximal distal, unchanged No movement noted in bilateral lower extremities, unchanged  Assessment/Plan: 1. 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  Physiatrist is providing close team supervision and 24 hour management of active medical problems listed below.  Physiatrist and rehab team continue to assess barriers to discharge/monitor patient progress toward functional and medical  goals  Care Tool:  Bathing    Body parts bathed by patient: Right arm, Left arm, Chest, Abdomen, Right upper leg, Left upper leg, Right lower leg, Left lower leg, Face, Front perineal area   Body parts bathed by helper: Buttocks     Bathing assist Assist Level: Contact Guard/Touching assist     Upper Body Dressing/Undressing Upper body dressing   What is the patient wearing?: Pull over shirt    Upper body assist Assist Level: Supervision/Verbal cueing    Lower Body Dressing/Undressing Lower body dressing      What is the patient wearing?: Incontinence brief, Pants     Lower body assist Assist for lower body dressing: Moderate Assistance - Patient 50 - 74%     Toileting Toileting    Toileting assist Assist for toileting: Dependent - Patient 0%     Transfers Chair/bed transfer  Transfers assist  Chair/bed transfer activity did not occur: Safety/medical concerns  Chair/bed transfer assist level: Minimal Assistance - Patient > 75%     Locomotion Ambulation   Ambulation assist   Ambulation activity did not occur: Safety/medical concerns          Walk 10 feet activity   Assist  Walk 10 feet activity did not occur: Safety/medical concerns        Walk 50 feet activity   Assist Walk 50 feet with 2 turns activity did not occur: Safety/medical concerns         Walk 150 feet activity   Assist Walk 150 feet activity did not occur: Safety/medical concerns         Walk  10 feet on uneven surface  activity   Assist Walk 10 feet on uneven surfaces activity did not occur: Safety/medical concerns         Wheelchair     Assist Will patient use wheelchair at discharge?: Yes Type of Wheelchair: Manual Wheelchair activity did not occur: Safety/medical concerns  Wheelchair assist level: Supervision/Verbal cueing Max wheelchair distance: 150    Wheelchair 50 feet with 2 turns activity    Assist    Wheelchair 50 feet with 2 turns  activity did not occur: Safety/medical concerns   Assist Level: Supervision/Verbal cueing   Wheelchair 150 feet activity     Assist  Wheelchair 150 feet activity did not occur: Safety/medical concerns   Assist Level: Supervision/Verbal cueing   Blood pressure 138/77, pulse 76, temperature 98.9 F (37.2 C), temperature source Oral, resp. rate 16, height 4\' 9"  (1.448 m), weight 65.6 kg, SpO2 97 %.   Medical Problem List and Plan: 1.  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  Antithrombotics: -DVT/anticoagulation:  Mechanical: Foot pump Bilateral lower extremities             -antiplatelet therapy: N/A due to bleed 3. Headaches/Pain Management: Added Ultram prn.   Increased Depakote ER to 750 mg daily and added norco for when tylneol and tramadol aren't effective.   Added 2 lidoderm patches 8pm to 8am daily for neck tightness, later received per patient preference 4. Mood: LCSW to follow for evaluation and support.              -antipsychotic agents: N/A 5. Neuropsych: This patient is fully capable of making decisions on his own behalf. 6. Skin/Wound Care: Routine pressure relief measures.  7. Fluids/Electrolytes/Nutrition: Monitor I/Os. Tolerating current diet.  8. Headaches: Persistent but appears to be improving.  Depakote added.  See #3 9. Leucocytosis: Resolved.    WBC 6.6 on 9/16  Continue to monitor 10. Hypokalemia: Continue daily supplement and adjust as needed.    Potassium 5.0 on 9/16, labs ordered for Monday  Potassium supplement decreased   11. HTN: Monitor BP tid--continue atenolol               Vitals:   01/12/19 1927 01/13/19 0534  BP: 120/74 138/77  Pulse: 73 76  Resp: 18 16  Temp: 98.9 F (37.2 C) 98.9 F (37.2 C)  SpO2: 98% 97%   Slightly labile 9/18 12. Seizure disorder: Continue Keppra 1500 mg bid.  Monitor reoccurrence.  No recent seizures 15. Neurogenic bowel-  pt goes 1-2x/week- requires manual disimpaction  Bowel program to be done 2x/week to disimpact bowels.  Stable with program 16. Attention/fatigue trial Provigil since doesn't raise BP as much 100 mg daily- can likely send out on Ritalin 17. Vertigo/dizziness-continue to follow   Patient sensitive to medications and will try to avoid lethargy  Will trial essential oils 18. R hip dysplasia- was due to go to [facility] for surgery-on hold at present 19.  Post stroke dysphagia:   Advanced to regular texture, thins on 9/11  LOS: 16 days A FACE TO FACE EVALUATION WAS PERFORMED  Ankit Karis JubaAnil Patel 01/13/2019, 10:15 AM

## 2019-01-14 ENCOUNTER — Inpatient Hospital Stay (HOSPITAL_COMMUNITY): Payer: Medicare Other

## 2019-01-14 ENCOUNTER — Inpatient Hospital Stay (HOSPITAL_COMMUNITY): Payer: Medicare Other | Admitting: Physical Therapy

## 2019-01-14 DIAGNOSIS — I10 Essential (primary) hypertension: Secondary | ICD-10-CM

## 2019-01-14 DIAGNOSIS — R42 Dizziness and giddiness: Secondary | ICD-10-CM

## 2019-01-14 NOTE — Progress Notes (Signed)
Speech Language Pathology Daily Session Note  Patient Details  Name: Carlos Ferguson MRN: [id number] Date of Birth: 22-Mar-1966  Today's Date: 01/14/2019 SLP Individual Time: 0801-0829 SLP Individual Time Calculation (min): 28 min  Short Term Goals: Week 3: SLP Short Term Goal 1 (Week 3): Pt will complete semi-complex problem solving tasks with Min A cues. SLP Short Term Goal 2 (Week 3): Given Mod A cues, pt will demonstrate use of 1 compensatory memory strategy to recall basic daily information in 5 out of 10 opportunities. SLP Short Term Goal 3 (Week 3): Pt will utilize memory notebook to answer basic [place] questions regarding information in 5 out of 10 opportunities with Min A cues. SLP Short Term Goal 4 (Week 3): Pt will demonstrate selective attention to task for ~ 30 minutes with min A cues for redirection.  Skilled Therapeutic Interventions: Skilled ST services focused on cognitive skills. Pt demonstrated recall of 2 out 3 rules for previously learned card task played at simplest level over two weeks ago. SLP facilitated semi-complex problem solving and recall (with aid) skills in familiar card task played at more complex level, pt required mod A verbal cues fading to min A verbal cues. Pt demonstrated ability to identify multiple solutions, however difficulty planning ahead with multiple solutions, suggest further impacted by working memory deficits. SLP recorded events in memory notebook. Pt demonstrated selective attention in 25 minute intervals with supervision A verbal cues. Pt was left in room with call bell within reach and chair alarm set. ST recommends to continue skilled ST services.      Pain Pain Assessment Pain Score: 0-No pain  Therapy/Group: Individual Therapy  Nial Hawe  [facility] 01/14/2019, 8:54 AM

## 2019-01-14 NOTE — Progress Notes (Signed)
Physical Therapy Session Note  Patient Details  Name: Carlos Ferguson MRN: [id number] Date of Birth: 06-17-1965  Today's Date: 01/14/2019 PT Individual Time: 1440-1505 PT Individual Time Calculation (min): 25 min   Short Term Goals: Week 3:  PT Short Term Goal 1 (Week 3): =LTG due to ELOS  Skilled Therapeutic Interventions/Progress Updates:    Pt received supine in bed, agreeable to PT session. Pt reports his headache is well-controlled, ongoing dizziness persists. Dependent to don BLE braces and pants for time conservation. Rolling L/R with min A and bedrails. Supine to sit with Supervision. Slide board transfer bed to w/c with min A. Manual w/c propulsion x 150 ft with use of BUE and Supervision. Pt left seated in w/c in room with needs in reach, quick release belt and chair alarm in place, dad present at end of session.  Therapy Documentation Precautions:  Precautions Precautions: Fall Restrictions Weight Bearing Restrictions: No    Therapy/Group: Individual Therapy   Excell Seltzer, PT, DPT  01/14/2019, 3:30 PM

## 2019-01-14 NOTE — Progress Notes (Signed)
[facility] PHYSICAL MEDICINE & REHABILITATION PROGRESS NOTE   Subjective/Complaints: Patient seen sitting up in bed this morning.  States he slept well overnight.  He notes nausea this morning.  He notes that his nausea and dizziness improved with essential oils yesterday.  Discussed with nursing to try again.  Discussed decreasing frequency of I/O cath with nursing as well.   ROS: + Nausea.  Denies CP, shortness of breath.  Objective:   No results found. No results for input(s): WBC, HGB, HCT, PLT in the last 72 hours. No results for input(s): NA, K, CL, CO2, GLUCOSE, BUN, CREATININE, CALCIUM in the last 72 hours.  Intake/Output Summary (Last 24 hours) at 01/14/2019 1525 Last data filed at 01/14/2019 1300 Gross per 24 hour  Intake 600 ml  Output 250 ml  Net 350 ml     Physical Exam: Vital Signs Blood pressure 112/76, pulse 67, temperature 99.4 F (37.4 C), temperature source Oral, resp. rate 17, height 4\' 9"  (1.448 m), weight 65.6 kg, SpO2 99 %. Constitutional: No distress.  Vital signs reviewed. HENT: Normocephalic.  Atraumatic. Eyes: EOMI.  No discharge. Cardiovascular: No JVD. Respiratory: Normal effort.  No stridor. GI: Non-distended. Skin: Healed incisions. Psych: Normal mood.  Normal behavior. Musc: Lower extremity contractures. Neurological: Alert Dysarthria Able to follow commands Motor: Bilateral upper extremities: 4+/5 proximal distal, unchanged No movement noted in bilateral lower extremities, unchanged  Assessment/Plan: 1. 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  Physiatrist is providing close team supervision and 24 hour management of active medical problems listed below.  Physiatrist and rehab team continue to assess  barriers to discharge/monitor patient progress toward functional and medical goals  Care Tool:  Bathing    Body parts bathed by patient: Right arm, Left arm, Chest, Abdomen, Right upper leg, Left upper leg, Right lower leg, Left lower leg, Face, Front perineal area   Body parts bathed by helper: Buttocks     Bathing assist Assist Level: Contact Guard/Touching assist     Upper Body Dressing/Undressing Upper body dressing   What is the patient wearing?: Pull over shirt    Upper body assist Assist Level: Supervision/Verbal cueing    Lower Body Dressing/Undressing Lower body dressing      What is the patient wearing?: Incontinence brief, Pants     Lower body assist Assist for lower body dressing: Moderate Assistance - Patient 50 - 74%     Toileting Toileting    Toileting assist Assist for toileting: Dependent - Patient 0%     Transfers Chair/bed transfer  Transfers assist  Chair/bed transfer activity did not occur: Safety/medical concerns  Chair/bed transfer assist level: Contact Guard/Touching assist     Locomotion Ambulation   Ambulation assist   Ambulation activity did not occur: Safety/medical concerns          Walk 10 feet activity   Assist  Walk 10 feet activity did not occur: Safety/medical concerns        Walk 50 feet activity   Assist Walk 50 feet with 2 turns activity did not occur: Safety/medical concerns         Walk 150 feet activity   Assist Walk 150 feet activity did not occur: Safety/medical concerns         Walk 10 feet on uneven surface  activity  Assist Walk 10 feet on uneven surfaces activity did not occur: Safety/medical concerns         Wheelchair     Assist Will patient use wheelchair at discharge?: Yes Type of Wheelchair: Manual Wheelchair activity did not occur: Safety/medical concerns  Wheelchair assist level: Supervision/Verbal cueing Max wheelchair distance: 150    Wheelchair 50 feet  with 2 turns activity    Assist    Wheelchair 50 feet with 2 turns activity did not occur: Safety/medical concerns   Assist Level: Supervision/Verbal cueing   Wheelchair 150 feet activity     Assist  Wheelchair 150 feet activity did not occur: Safety/medical concerns   Assist Level: Supervision/Verbal cueing   Blood pressure 112/76, pulse 67, temperature 99.4 F (37.4 C), temperature source Oral, resp. rate 17, height 4\' 9"  (1.448 m), weight 65.6 kg, SpO2 99 %.   Medical Problem List and Plan: 1.  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  Antithrombotics: -DVT/anticoagulation:  Mechanical: Foot pump Bilateral lower extremities             -antiplatelet therapy: N/A due to bleed 3. Headaches/Pain Management: Added Ultram prn.   Increased Depakote ER to 750 mg daily and added norco for when tylneol and tramadol aren't effective.   Added 2 lidoderm patches 8pm to 8am daily for neck tightness, later received per patient preference 4. Mood: LCSW to follow for evaluation and support.              -antipsychotic agents: N/A 5. Neuropsych: This patient is fully capable of making decisions on his own behalf. 6. Skin/Wound Care: Routine pressure relief measures.  7. Fluids/Electrolytes/Nutrition: Monitor I/Os. Tolerating current diet.  8. Headaches: Persistent but appears to be improving.  Depakote added.  See #3 9. Leucocytosis: Resolved.    WBC 6.6 on 9/16  Continue to monitor 10. Hypokalemia: Continue daily supplement and adjust as needed.    Potassium 5.0 on 9/16, labs ordered for Monday  Potassium supplement decreased   11. HTN: Monitor BP tid--continue atenolol               Vitals:   01/14/19 0510 01/14/19 1317  BP: 128/67 112/76  Pulse: 74 67  Resp: 18 17  Temp: 98.5 F (36.9 C) 99.4 F (37.4 C)  SpO2: 98% 99%   Controlled on 9/19 12. Seizure disorder: Continue Keppra 1500 mg  bid.  Monitor reoccurrence.  No recent seizures, unchanged 15. 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  Overall improved with program 16. Attention/fatigue trial Provigil since doesn't raise BP as much 100 mg daily- can likely send out on Ritalin 17. Vertigo/dizziness/nausea-continue to follow   Patient sensitive to medications and will try to avoid lethargy  Benefit with essential oils, continue 18. R hip dysplasia- was due to go to [facility] for surgery-on hold at present 19.  Post stroke dysphagia:   Advanced to regular texture, thins on 9/11  LOS: 17 days A FACE TO FACE EVALUATION WAS PERFORMED  Ankit Karis Jubanil Patel 01/14/2019, 3:25 PM

## 2019-01-15 ENCOUNTER — Inpatient Hospital Stay (HOSPITAL_COMMUNITY): Payer: Medicare Other

## 2019-01-15 NOTE — Progress Notes (Signed)
[facility] PHYSICAL MEDICINE & REHABILITATION PROGRESS NOTE   Subjective/Complaints: Patient seen sitting up in bed this morning.  He states he slept fairly overnight.  He states that his hip pain becomes worse as his bladder fails.  He thinks that his pain will improve with more frequent I/O caths. Discussed with nursing freq of I/O caths.  ROS: Denies CP, shortness of breath, nausea, vomiting..  Objective:   No results found. No results for input(s): WBC, HGB, HCT, PLT in the last 72 hours. No results for input(s): NA, K, CL, CO2, GLUCOSE, BUN, CREATININE, CALCIUM in the last 72 hours.  Intake/Output Summary (Last 24 hours) at 01/15/2019 1346 Last data filed at 01/15/2019 1206 Gross per 24 hour  Intake 240 ml  Output 1700 ml  Net -1460 ml     Physical Exam: Vital Signs Blood pressure 139/64, pulse 70, temperature 98.1 F (36.7 C), temperature source Oral, resp. rate 17, height 4\' 9"  (1.448 m), weight 65.6 kg, SpO2 99 %. Constitutional: No distress . Vital signs reviewed. HENT: Normocephalic.  Atraumatic. Eyes: EOMI. No discharge. Cardiovascular: No JVD. Respiratory: Normal effort.  No stridor. GI: Non-distended. Skin: Healed incisions. Psych: Normal mood.  Normal behavior. Musc: LE contractures Neurological: Alert Dysarthria Able to follow commands Motor: Bilateral upper extremities: 4+/5 proximal distal, stable No movement noted in bilateral lower extremities, stable  Assessment/Plan: 1. 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  Physiatrist is providing close team supervision and 24 hour management of active medical problems listed below.  Physiatrist and rehab team continue to assess barriers to discharge/monitor patient progress  toward functional and medical goals  Care Tool:  Bathing    Body parts bathed by patient: Right arm, Left arm, Chest, Abdomen, Right upper leg, Left upper leg, Right lower leg, Left lower leg, Face, Front perineal area   Body parts bathed by helper: Buttocks     Bathing assist Assist Level: Contact Guard/Touching assist     Upper Body Dressing/Undressing Upper body dressing   What is the patient wearing?: Pull over shirt    Upper body assist Assist Level: Supervision/Verbal cueing    Lower Body Dressing/Undressing Lower body dressing      What is the patient wearing?: Incontinence brief, Pants     Lower body assist Assist for lower body dressing: Moderate Assistance - Patient 50 - 74%     Toileting Toileting    Toileting assist Assist for toileting: Dependent - Patient 0%     Transfers Chair/bed transfer  Transfers assist  Chair/bed transfer activity did not occur: Safety/medical concerns  Chair/bed transfer assist level: Contact Guard/Touching assist(and set up assist)     Locomotion Ambulation   Ambulation assist   Ambulation activity did not occur: Safety/medical concerns          Walk 10 feet activity   Assist  Walk 10 feet activity did not occur: Safety/medical concerns        Walk 50 feet activity   Assist Walk 50 feet with 2 turns activity did not occur: Safety/medical concerns         Walk 150 feet activity   Assist Walk 150 feet activity did not occur: Safety/medical concerns         Walk 10 feet on uneven surface  activity   Assist Walk  10 feet on uneven surfaces activity did not occur: Safety/medical concerns         Wheelchair     Assist Will patient use wheelchair at discharge?: Yes Type of Wheelchair: Manual Wheelchair activity did not occur: Safety/medical concerns  Wheelchair assist level: Supervision/Verbal cueing Max wheelchair distance: 150    Wheelchair 50 feet with 2 turns  activity    Assist    Wheelchair 50 feet with 2 turns activity did not occur: Safety/medical concerns   Assist Level: Supervision/Verbal cueing   Wheelchair 150 feet activity     Assist  Wheelchair 150 feet activity did not occur: Safety/medical concerns   Assist Level: Supervision/Verbal cueing   Blood pressure 139/64, pulse 70, temperature 98.1 F (36.7 C), temperature source Oral, resp. rate 17, height 4\' 9"  (1.448 m), weight 65.6 kg, SpO2 99 %.   Medical Problem List and Plan: 1.  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  Antithrombotics: -DVT/anticoagulation:  Mechanical: Foot pump Bilateral lower extremities             -antiplatelet therapy: N/A due to bleed 3. Headaches/Pain Management: Added Ultram prn.   Increased Depakote ER to 750 mg daily and added norco for when tylneol and tramadol aren't effective.   Added 2 lidoderm patches 8pm to 8am daily for neck tightness, later received per patient preference  Monitor with change in I/O cath schedule 4. Mood: LCSW to follow for evaluation and support.              -antipsychotic agents: N/A 5. Neuropsych: This patient is fully capable of making decisions on his own behalf. 6. Skin/Wound Care: Routine pressure relief measures.  7. Fluids/Electrolytes/Nutrition: Monitor I/Os. Tolerating current diet.  8. Headaches: Persistent but appears to be improving.  Depakote added.  See #3 9. Leucocytosis: Resolved.    WBC 6.6 on 9/16  Continue to monitor 10. Hypokalemia: Continue daily supplement and adjust as needed.    Potassium 5.0 on 9/16, labs ordered for tomorrow  Potassium supplement decreased   11. HTN: Monitor BP tid--continue atenolol               Vitals:   01/14/19 2010 01/15/19 0501  BP: (!) 141/83 139/64  Pulse: 62 70  Resp: 18 17  Temp: 97.6 F (36.4 C) 98.1 F (36.7 C)  SpO2: 98% 99%   Labile on 9/20 12. Seizure  disorder: Continue Keppra 1500 mg bid.  Monitor reoccurrence.  No recent seizures, unchanged 15. 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  Overall improved with program 16. Attention/fatigue trial Provigil since doesn't raise BP as much 100 mg daily- can likely send out on Ritalin 17. Vertigo/dizziness/nausea-continue to follow   Patient sensitive to medications and will try to avoid lethargy  Benefit with essential oils, continue 18. R hip dysplasia- was due to go to [facility] for surgery-on hold at present 19.  Post stroke dysphagia:   Advanced to regular texture, thins on 9/11 20.  Neurogenic bladder  Discussed with nursing, change bladder scans to every 4  LOS: 18 days A FACE TO FACE EVALUATION WAS PERFORMED  Ankit Karis JubaAnil Patel 01/15/2019, 1:46 PM

## 2019-01-15 NOTE — Progress Notes (Signed)
Physical Therapy Session Note  Patient Details  Name: Carlos Ferguson MRN: [id number] Date of Birth: 04-Sep-1965  Today's Date: 01/15/2019 PT Individual Time: 0905-0947 PT Individual Time Calculation (min): 42 min   Short Term Goals: Week 1:  PT Short Term Goal 1 (Week 1): Pt will maintain sitting balance EOB with mod A PT Short Term Goal 1 - Progress (Week 1): Met PT Short Term Goal 2 (Week 1): Pt will complete least restrictive transfer with assist x 1 consistently PT Short Term Goal 2 - Progress (Week 1): Met PT Short Term Goal 3 (Week 1): Pt will initiate w/c mobility PT Short Term Goal 3 - Progress (Week 1): Met Week 2:  PT Short Term Goal 1 (Week 2): Pt will perform least restrictive transfer with CGA consistently PT Short Term Goal 1 - Progress (Week 2): Met PT Short Term Goal 2 (Week 2): Pt will propel w/c x 150 ft with BUE and CGA PT Short Term Goal 2 - Progress (Week 2): Met PT Short Term Goal 3 (Week 2): Pt will tolerate sitting up in w/c x 2 hours PT Short Term Goal 3 - Progress (Week 2): Progressing toward goal Week 3:  PT Short Term Goal 1 (Week 3): =LTG due to ELOS  Skilled Therapeutic Interventions/Progress Updates:  Pain - pt denies pain today    supine TKE's RLE, quad sets LLE x 15 each w/2 sec hold Supine to sit on edge of bed w/supervision Pt able to maintain static sit in long sitting while therapist donned L long leg brace, pt able to strap upper straps. Bed to wc w/sliding board w/set up and cga. wc propulsion 262f w/cues only due to visual deficits.  Ball toss x 3 min for cardiovascular challenge using yellow beach ball UE strengthening including overhead press and forward rows w/3lb bar 2x20  wc propulsion 206fas above. wc to bed via sliding board w/set up and cga. Sit to long sit in bed w/wupervision L long leg brace removed w/min assist from therapist.  Pt left supine w/needs in reach, bed alarm set, rails up x 3.    More alert today w/improved  activity tolerance.  Pt declined remaining OOB due to awaiting nursing for cath per pt.   Therapy Documentation Precautions:  Precautions Precautions: Fall Restrictions Weight Bearing Restrictions: No    Therapy/Group: Individual Therapy  BaCallie FieldingPT  01/15/2019, 12:42 PM

## 2019-01-16 ENCOUNTER — Inpatient Hospital Stay (HOSPITAL_COMMUNITY): Payer: Medicare Other | Admitting: Physical Therapy

## 2019-01-16 ENCOUNTER — Inpatient Hospital Stay (HOSPITAL_COMMUNITY): Payer: Medicare Other

## 2019-01-16 ENCOUNTER — Inpatient Hospital Stay (HOSPITAL_COMMUNITY): Payer: Medicare Other | Admitting: Occupational Therapy

## 2019-01-16 LAB — CBC
HCT: 43.4 % (ref 39.0–52.0)
Hemoglobin: 15.5 g/dL (ref 13.0–17.0)
MCH: 32.8 pg (ref 26.0–34.0)
MCHC: 35.7 g/dL (ref 30.0–36.0)
MCV: 91.8 fL (ref 80.0–100.0)
Platelets: 200 10*3/uL (ref 150–400)
RBC: 4.73 MIL/uL (ref 4.22–5.81)
RDW: 13.7 % (ref 11.5–15.5)
WBC: 5.2 10*3/uL (ref 4.0–10.5)
nRBC: 0 % (ref 0.0–0.2)

## 2019-01-16 LAB — BASIC METABOLIC PANEL
Anion gap: 8 (ref 5–15)
BUN: 13 mg/dL (ref 6–20)
CO2: 28 mmol/L (ref 22–32)
Calcium: 9 mg/dL (ref 8.9–10.3)
Chloride: 105 mmol/L (ref 98–111)
Creatinine, Ser: 0.66 mg/dL (ref 0.61–1.24)
GFR calc Af Amer: >60 mL/min (ref >60)
GFR calc non Af Amer: >60 mL/min (ref >60)
Glucose, Bld: 96 mg/dL (ref 70–99)
Potassium: 3.7 mmol/L (ref 3.5–5.1)
Sodium: 141 mmol/L (ref 135–145)

## 2019-01-16 LAB — URINALYSIS, ROUTINE W REFLEX MICROSCOPIC
Bilirubin Urine: NEGATIVE
Glucose, UA: NEGATIVE mg/dL
Ketones, ur: 5 mg/dL — AB
Nitrite: POSITIVE — AB
Protein, ur: NEGATIVE mg/dL
Specific Gravity, Urine: 1.019 (ref 1.005–1.030)
pH: 5 (ref 5.0–8.0)

## 2019-01-16 MED ORDER — FLAVOXATE HCL 100 MG PO TABS
100.0000 mg | ORAL_TABLET | Freq: Three times a day (TID) | ORAL | Status: DC | PRN
  Administered 2019-01-16 – 2019-01-30 (×6): 100 mg via ORAL
  Filled 2019-01-16 (×8): qty 1

## 2019-01-16 NOTE — Progress Notes (Signed)
Physical Therapy Session Note  Patient Details  Name: Carlos Ferguson MRN: [id number] Date of Birth: 1965-08-19  Today's Date: 01/16/2019 PT Individual Time: 1015-1100 PT Individual Time Calculation (min): 45 min   Short Term Goals: Week 3:  PT Short Term Goal 1 (Week 3): =LTG due to ELOS  Skilled Therapeutic Interventions/Progress Updates:    Pt received seated in w/c in room, agreeable to PT session. No complaints of pain but pt does report ongoing dizziness. Manual w/c propulsion 2 x 150 ft with use of BUE and Supervision. Sit to stand x 3 reps to stair rails with min to mod A with LLE brace locked in extension. Pt tolerates standing x 45 sec, x 30 sec, x 15 sec. Pt fatigues quickly with standing activity and relies heavily on BUE in standing. Slide board transfer w/c to/from mat table with focus on anterior weight shift during transfer. Pt tends to lean posteriorly during transfer and requires min verbal and tactile cueing for leaning anteriorly. Pt requests to return to bed at end of session for I/O cath. Slide board transfer back to bed with CGA. Sit to long-sit on bed with Supervision. Pt left seated in bed with needs in reach, bed alarm in place. NT notified and able to come perform bladder scan on patient.  Therapy Documentation Precautions:  Precautions Precautions: Fall Restrictions Weight Bearing Restrictions: No    Therapy/Group: Individual Therapy   Excell Seltzer, PT, DPT  01/16/2019, 12:48 PM

## 2019-01-16 NOTE — Progress Notes (Signed)
Occupational Therapy Session Note  Patient Details  Name: Carlos Ferguson MRN: [id number] Date of Birth: 08-10-1965  Today's Date: 01/16/2019 OT Individual Time: 0800-0900 OT Individual Time Calculation (min): 60 min    Short Term Goals: Week 3:  OT Short Term Goal 1 (Week 3): STG=LTG awaiting SNF placement  Skilled Therapeutic Interventions/Progress Updates:    Pt eating breakfast upon arrival for scheduled 7:30 OT session, pt requesting therapist to return following breakfast.  Therapist returned at 8:00 and pt agreeable to tx session, denying pain.  He completed bathing/dressing routine from unsupported long sitting position. Supervision/VCs for UB bathing/dressing. LB bathing completed with supervision (declined buttock/pericare as completed just prior to session by nursing). Min A to don braces. Pt threaded pants from long sitting position. Returned to supine in order to roll to pull pants up, however, severe dizziness with bed mobility and needing increased time and rest breaks to stabilze gaze. Severe nystagmus noted with bed mobility. Therapist assisted with pulling pants up in order to just have to roll to each side x1. Returned to long sitting to don shoes with mod A. Completed supervision slide board transfer with assist for set-up and to stabilize equipment. Grooming tasks completed mod I from w/c level at sink.  Addressed problem solving of shower transfer using STEDY. Pt to practice during PT session, pt left seated in w/c with hand off to PT.   Therapy Documentation Precautions:  Precautions Precautions: Fall Restrictions Weight Bearing Restrictions: No   Therapy/Group: Individual Therapy  Kaelan Emami L 01/16/2019, 6:59 AM

## 2019-01-16 NOTE — Progress Notes (Signed)
Speech Language Pathology Daily Session Note  Patient Details  Name: Carlos Ferguson MRN: [id number] Date of Birth: 02-12-1966  Today's Date: 01/16/2019 SLP Individual Time: 5784-6962 SLP Individual Time Calculation (min): 57 min  Short Term Goals: Week 3: SLP Short Term Goal 1 (Week 3): Pt will complete semi-complex problem solving tasks with Min A cues. SLP Short Term Goal 2 (Week 3): Given Mod A cues, pt will demonstrate use of 1 compensatory memory strategy to recall basic daily information in 5 out of 10 opportunities. SLP Short Term Goal 3 (Week 3): Pt will utilize memory notebook to answer basic [place] questions regarding information in 5 out of 10 opportunities with Min A cues. SLP Short Term Goal 4 (Week 3): Pt will demonstrate selective attention to task for ~ 30 minutes with min A cues for redirection.  Skilled Therapeutic Interventions: Skilled ST services focused on cognitive skills. Pt demonstrated recall of three therapies/events with min A verbal cues. SLP facilitated semi-complex problem solving skills in novel account balancing task, pt required min A verbal cues for problem solving with calculator, however required max A due to visual changes due to acute CVA. SLP assisted pt in reading mail, pt required mod A verbal cues to decode at sentence level. Pt expressed bladder pain, " I think I have another bladder infection" and SLP notified nurse. Nurse communicated with pt and began bladder scan. Pt was left in room with nurse, call bell within reach and bed alarm set. ST recommends to continue skilled ST services.      Pain Pain Assessment Faces Pain Scale: Hurts little more Pain Type: Acute pain Pain Location: Bladder Pain Descriptors / Indicators: Discomfort Pain Onset: On-going Pain Intervention(s): RN made aware  Therapy/Group: Individual Therapy  Lonetta Blassingame  [facility] 01/16/2019, 3:15 PM

## 2019-01-16 NOTE — Progress Notes (Signed)
Physical Therapy Session Note  Patient Details  Name: Carlos Ferguson MRN: [id number] Date of Birth: 03-22-1966  Today's Date: 01/16/2019 PT Individual Time: 0900-0935 PT Individual Time Calculation (min): 35 min   Short Term Goals: Week 3:  PT Short Term Goal 1 (Week 3): =LTG due to ELOS  Skilled Therapeutic Interventions/Progress Updates:     Patient in w/c handed off from [facility], OT upon PT arrival. Patient alert and agreeable to PT session. Session focused on strategies to transfer from the w/c to the shower chair. Patient denied pain throughout session and had L KAFO and R AFO donned with B tennis shoes prior to session. Patient expressed his desire to stand and walk prior to d/c from CIR. PT discussed patient's strategies/AD for ambulating PTA and potential options to progress during therapies. Also discussed deconditioning from hospital stay and the need to build up strength/endurance with lower level activities prior to ambulating.   Therapeutic Activity: Transfers: Patient performed sit to/from stand x2 using the bariatric Stedy with his L KAFO locked out from his w/c with mod-min A, assisting under his arms to avoid low back sensitivity to touch. He then performed sit to/form stand from the seat on the [place] x3 with min A and was transported to the shower chair in the bathroom using the [place]. Patient sits very low on the seat of the Stedy, but able to maintain balance using UEs to on bar. The shower chair was set too high for the patient to reach the Stedy to stand and patient performed a slide board transfer from the shower chair back to the w/c with total A for board placement and CGA for the transfer for safety/balance. Provided verbal cues for pushing throughout B LEs for support in standing and board and hand placement for slide board transfers.  Patient in w/c at end of session with breaks locked, seat belt alarm set, and all needs within reach.    Therapy  Documentation Precautions:  Precautions Precautions: Fall Restrictions Weight Bearing Restrictions: No   Therapy/Group: Individual Therapy  [name] L [name] PT, DPT  01/16/2019, 12:13 PM

## 2019-01-16 NOTE — Progress Notes (Signed)
[facility] PHYSICAL MEDICINE & REHABILITATION PROGRESS NOTE   Subjective/Complaints:   Pt reports is super dizzy this AM- initially said no difference with scopolamine patch but then thought and said it might help, as day goes on, things get better, but beginning of day is awful. HA was BAD last night, however gone when woke up this AM.   ROS: Denies CP, shortness of breath, nausea, vomiting..  Objective:   No results found. No results for input(s): WBC, HGB, HCT, PLT in the last 72 hours. No results for input(s): NA, K, CL, CO2, GLUCOSE, BUN, CREATININE, CALCIUM in the last 72 hours.  Intake/Output Summary (Last 24 hours) at 01/16/2019 1027 Last data filed at 01/16/2019 0837 Gross per 24 hour  Intake 840 ml  Output 1650 ml  Net -810 ml     Physical Exam: Vital Signs Blood pressure 131/74, pulse 70, temperature 99.5 F (37.5 C), temperature source Oral, resp. rate 18, height 4\' 9"  (1.448 m), weight 65.6 kg, SpO2 98 %. Constitutional: No distress . Vital signs reviewed. Sitting up in bed, OT at bedside, hair disheveled, NAD HENT: Normocephalic.  Atraumatic. Head tender on occiput Eyes: EOMI. However has a lot of nystagmus in every direction this AM B/L Cardiovascular: No JVD. Respiratory: Normal effort.  No stridor. GI: Non-distended. Skin: Healed incisions. Psych: Normal mood.  Normal behavior. Musc: LE contractures Neurological: Alert Dysarthria Able to follow commands Motor: Bilateral upper extremities: 4+/5 proximal distal, stable No movement noted in bilateral lower extremities, stable  Assessment/Plan: 1. 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  Physiatrist is providing close team supervision and 24 hour management of active  medical problems listed below.  Physiatrist and rehab team continue to assess barriers to discharge/monitor patient progress toward functional and medical goals  Care Tool:  Bathing    Body parts bathed by patient: Right arm, Left arm, Chest, Abdomen, Right upper leg, Left upper leg, Right lower leg, Left lower leg, Face   Body parts bathed by helper: Buttocks Body parts n/a: Front perineal area, Buttocks(Pt declined this session)   Bathing assist Assist Level: Supervision/Verbal cueing     Upper Body Dressing/Undressing Upper body dressing   What is the patient wearing?: Pull over shirt    Upper body assist Assist Level: Supervision/Verbal cueing    Lower Body Dressing/Undressing Lower body dressing      What is the patient wearing?: Incontinence brief, Pants     Lower body assist Assist for lower body dressing: Minimal Assistance - Patient > 75%     Toileting Toileting    Toileting assist Assist for toileting: Dependent - Patient 0%     Transfers Chair/bed transfer  Transfers assist  Chair/bed transfer activity did not occur: Safety/medical concerns  Chair/bed transfer assist level: Contact Guard/Touching assist(sliding board)     Locomotion Ambulation   Ambulation assist   Ambulation activity did not occur: Safety/medical concerns          Walk 10 feet activity   Assist  Walk 10 feet activity did not occur: Safety/medical concerns        Walk 50 feet activity   Assist Walk 50 feet with 2 turns activity did not occur: Safety/medical concerns         Walk 150 feet activity   Assist Walk 150  feet activity did not occur: Safety/medical concerns         Walk 10 feet on uneven surface  activity   Assist Walk 10 feet on uneven surfaces activity did not occur: Safety/medical concerns         Wheelchair     Assist Will patient use wheelchair at discharge?: Yes Type of Wheelchair: Manual Wheelchair activity did not occur:  Safety/medical concerns  Wheelchair assist level: Supervision/Verbal cueing Max wheelchair distance: 150    Wheelchair 50 feet with 2 turns activity    Assist    Wheelchair 50 feet with 2 turns activity did not occur: Safety/medical concerns   Assist Level: Supervision/Verbal cueing   Wheelchair 150 feet activity     Assist  Wheelchair 150 feet activity did not occur: Safety/medical concerns   Assist Level: Supervision/Verbal cueing   Blood pressure 131/74, pulse 70, temperature 99.5 F (37.5 C), temperature source Oral, resp. rate 18, height 4\' 9"  (1.448 m), weight 65.6 kg, SpO2 98 %.   Medical Problem List and Plan: 1.  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  Antithrombotics: -DVT/anticoagulation:  Mechanical: Foot pump Bilateral lower extremities             -antiplatelet therapy: N/A due to bleed 3. Headaches/Pain Management: Added Ultram prn.   Increased Depakote ER to 750 mg daily and added norco for when tylneol and tramadol aren't effective.   Added 2 lidoderm patches 8pm to 8am daily for neck tightness, later received per patient preference  Monitor with change in I/O cath schedule 4. Mood: LCSW to follow for evaluation and support.              -antipsychotic agents: N/A 5. Neuropsych: This patient is fully capable of making decisions on his own behalf. 6. Skin/Wound Care: Routine pressure relief measures.  7. Fluids/Electrolytes/Nutrition: Monitor I/Os. Tolerating current diet.  8. Headaches: Persistent but appears to be improving.  Depakote added.  See #3 9. Leucocytosis: Resolved.    WBC 6.6 on 9/16  Continue to monitor 10. Hypokalemia: Continue daily supplement and adjust as needed.    Potassium 5.0 on 9/16, labs ordered for tomorrow  Potassium supplement decreased   11. HTN: Monitor BP tid--continue atenolol               Vitals:   01/15/19 1931 01/16/19 0307   BP: 124/74 131/74  Pulse: 79 70  Resp: 16 18  Temp: 98.7 F (37.1 C) 99.5 F (37.5 C)  SpO2: 99% 98%   Labile on 9/20 12. Seizure disorder: Continue Keppra 1500 mg bid.  Monitor reoccurrence.  No recent seizures, unchanged 15. 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  Overall improved with program 16. Attention/fatigue trial Provigil since doesn't raise BP as much 100 mg daily- can likely send out on Ritalin 17. Vertigo/dizziness/nausea-continue to follow   Patient sensitive to medications and will try to avoid lethargy  Benefit with essential oils, continue  9/21- on scopolamine patch- pt questionable if helping, but told PA it was.  18. R hip dysplasia- was due to go to [facility] for surgery-on hold at present 19.  Post stroke dysphagia:   Advanced to regular texture, thins on 9/11 20.  Neurogenic bladder  Discussed with nursing, change bladder scans to every 4 hours  LOS: 19 days A FACE TO FACE EVALUATION WAS PERFORMED  Pamelyn Bancroft  01/16/2019, 10:27 AM

## 2019-01-16 NOTE — Progress Notes (Signed)
Occupational Therapy Session Note  Patient Details  Name: Advik Weatherspoon MRN: [id number] Date of Birth: 05/31/1965  Today's Date: 01/16/2019 OT Individual Time: 1325-1403 OT Individual Time Calculation (min): 38 min    Short Term Goals: Week 3:  OT Short Term Goal 1 (Week 3): STG=LTG awaiting SNF placement  Skilled Therapeutic Interventions/Progress Updates:    Upon entering the room, pt supine in bed with HOB elevated and finishing lunch. Pt reports extreme fatigue and states, " I am just so tired today. I can't right now." OT providing education on energy conservation for self care tasks and for discharge. Pt engaged actively in conversation with appropriate questions being asked. Pt remained in bed with RN present giving medications. Bed alarm activated and call bell within reach.   Therapy Documentation Precautions:  Precautions Precautions: Fall Restrictions Weight Bearing Restrictions: No General:   Vital Signs: Therapy Vitals Temp: 98.2 F (36.8 C) Temp Source: Oral Pulse Rate: 75 Resp: 18 BP: 139/84 Patient Position (if appropriate): Lying Oxygen Therapy SpO2: 100 %  Therapy/Group: Individual Therapy  Gypsy Decant 01/16/2019, 2:38 PM

## 2019-01-17 ENCOUNTER — Inpatient Hospital Stay (HOSPITAL_COMMUNITY): Payer: Medicare Other | Admitting: Rehabilitation

## 2019-01-17 ENCOUNTER — Inpatient Hospital Stay (HOSPITAL_COMMUNITY): Payer: Medicare Other | Admitting: Physical Therapy

## 2019-01-17 ENCOUNTER — Inpatient Hospital Stay (HOSPITAL_COMMUNITY): Payer: Medicare Other | Admitting: Occupational Therapy

## 2019-01-17 ENCOUNTER — Inpatient Hospital Stay (HOSPITAL_COMMUNITY): Payer: Medicare Other

## 2019-01-17 MED ORDER — CALCIUM CARBONATE ANTACID 500 MG PO CHEW
1.0000 | CHEWABLE_TABLET | Freq: Three times a day (TID) | ORAL | Status: DC | PRN
  Administered 2019-01-17 – 2019-01-28 (×8): 200 mg via ORAL
  Filled 2019-01-17 (×9): qty 1

## 2019-01-17 MED ORDER — MECLIZINE HCL 25 MG PO TABS
12.5000 mg | ORAL_TABLET | Freq: Three times a day (TID) | ORAL | Status: DC | PRN
  Administered 2019-01-17 – 2019-01-19 (×3): 12.5 mg via ORAL
  Filled 2019-01-17 (×4): qty 1

## 2019-01-17 MED ORDER — NITROFURANTOIN MONOHYD MACRO 100 MG PO CAPS
100.0000 mg | ORAL_CAPSULE | Freq: Two times a day (BID) | ORAL | Status: DC
  Administered 2019-01-17 – 2019-01-18 (×3): 100 mg via ORAL
  Filled 2019-01-17 (×3): qty 1

## 2019-01-17 NOTE — Progress Notes (Signed)
Physical Therapy Session Note  Patient Details  Name: Carlos Ferguson MRN: [id number] Date of Birth: 11-05-1965  Today's Date: 01/17/2019 PT Individual Time: 1415-1458 PT Individual Time Calculation (min): 43 min   Short Term Goals: Week 1:  PT Short Term Goal 1 (Week 1): Pt will maintain sitting balance EOB with mod A PT Short Term Goal 1 - Progress (Week 1): Met PT Short Term Goal 2 (Week 1): Pt will complete least restrictive transfer with assist x 1 consistently PT Short Term Goal 2 - Progress (Week 1): Met PT Short Term Goal 3 (Week 1): Pt will initiate w/c mobility PT Short Term Goal 3 - Progress (Week 1): Met Week 2:  PT Short Term Goal 1 (Week 2): Pt will perform least restrictive transfer with CGA consistently PT Short Term Goal 1 - Progress (Week 2): Met PT Short Term Goal 2 (Week 2): Pt will propel w/c x 150 ft with BUE and CGA PT Short Term Goal 2 - Progress (Week 2): Met PT Short Term Goal 3 (Week 2): Pt will tolerate sitting up in w/c x 2 hours PT Short Term Goal 3 - Progress (Week 2): Progressing toward goal Week 3:  PT Short Term Goal 1 (Week 3): =LTG due to ELOS  Skilled Therapeutic Interventions/Progress Updates:   Pt received lying in bed, reporting "not having a good day today."  When questioned why, he was not able to be specific other than to say, "I'm not feeling well."  Pt was agreeable to therapy session. Performed supine>sit with bed functions at S level.  Pt had AFO/KAFO donned, however PT assisted with donning shoes.  Pt performed slideboard transfer without use of 4" step today still at [facility] level with cues to lean forward during transfer.  Assisted to therapy gym for time management.  Session focused on sit<>stand transfer and standing tolerance/endurance.  Began in therapy gym at stairs for use of B rails to stand x 3 reps with mod A.  Pt would have to stand then PT would have to assist in placing LLE under him for improved balance.  Performed 30 secs, 40 secs,  and 35 secs.   Then transitioned to low RW, however this was too high, so retrieved youth walker and performed 2 more stands with mod/max A again with assist to get LLE into correct position.  Pt still unable to straighten arms out and needed to support using forearms, but was able to achieve more upright posture.  While standing with RW, performed lateral weight shifts with cues for improved muscle activation at hips/quads.  Pt asking if he can use crutches to stand.  PT discussed that he would need to have more strength and balance to be able to stand with crutches and pt then agreed.  Assisted back to room to perform one more rep of standing at sink.  Pt did very well pulling to stand at min/mod A with PT assisting LLE.  Had him attempt to keep forearms on sink and grab toothbrush.  PT discussed that she would like to have him be able to do something functional in standing.  Pt verbalized understanding.  Pt left in w/c at end of session with seat belt alarm and all needs in reach.   Therapy Documentation Precautions:  Precautions Precautions: Fall Restrictions Weight Bearing Restrictions: No Pain: Pain Assessment Pain Score: 0-No pain    Therapy/Group: Individual Therapy  Denice Bors 01/17/2019, 3:33 PM

## 2019-01-17 NOTE — Progress Notes (Signed)
Speech Language Pathology Daily Session Note  Patient Details  Name: Carlos Ferguson MRN: [id number] Date of Birth: 1965/07/05  Today's Date: 01/17/2019 SLP Individual Time: 1101-1158 SLP Individual Time Calculation (min): 57 min  Short Term Goals: Week 3: SLP Short Term Goal 1 (Week 3): Pt will complete semi-complex problem solving tasks with Min A cues. SLP Short Term Goal 2 (Week 3): Given Mod A cues, pt will demonstrate use of 1 compensatory memory strategy to recall basic daily information in 5 out of 10 opportunities. SLP Short Term Goal 3 (Week 3): Pt will utilize memory notebook to answer basic Dutton questions regarding information in 5 out of 10 opportunities with Min A cues. SLP Short Term Goal 4 (Week 3): Pt will demonstrate selective attention to task for ~ 30 minutes with min A cues for redirection.  Skilled Therapeutic Interventions: Skilled ST services focused on cognitive skills. Pt demonstrated recall of today's events with min A verbal cues. SLP facilitated semi-complex problem solving skills in medication management and verbal problem solving task from [place], pt required min A verbal cues for verbal problem solving of medication management, but max A verbal cues to demonstrate on pill chart and given verbal problem solving questions pt required total A in 4/4 questions with SLP recorded written information dicated by pt. SLP also facilitated basic problem solving skills in money management task (counting, displaying and making change) with SLP recording amounts on paper to aid in recall, pt demonstrated ability to dictate calcuations with mod A verbal cues, however required max A verbal cues to functional demonstrate with money. Pt demonstrated decline in cognitive function compared to previous session, pt expressed dizziness and awaiting UTI results as possible factors impacting performance. Pt was left in room with call bell within reach and bed alarm set. ST recommends to continue  skilled ST services.      Pain Pain Assessment Pain Score: 0-No pain  Therapy/Group: Individual Therapy  Carlos Ferguson  [facility] 01/17/2019, 2:34 PM

## 2019-01-17 NOTE — Plan of Care (Signed)
  Problem: Consults Goal: RH STROKE PATIENT EDUCATION Description: See Patient Education module for education specifics  Outcome: Progressing Goal: Nutrition Consult-if indicated Outcome: Progressing   Problem: RH BOWEL ELIMINATION Goal: RH STG MANAGE BOWEL WITH ASSISTANCE Description: STG Manage Bowel with max Assistance. Outcome: Progressing Goal: RH STG MANAGE BOWEL W/MEDICATION W/ASSISTANCE Description: STG Manage Bowel with Medication with max Assistance. Outcome: Progressing   Problem: RH BLADDER ELIMINATION Goal: RH STG MANAGE BLADDER WITH ASSISTANCE Description: STG Manage Bladder With mod Assistance Outcome: Progressing Goal: RH STG MANAGE BLADDER WITH MEDICATION WITH ASSISTANCE Description: STG Manage Bladder With Medication With mod Assistance. Outcome: Progressing Goal: RH STG MANAGE BLADDER WITH EQUIPMENT WITH ASSISTANCE Description: STG Manage Bladder With Equipment With mod Assistance Outcome: Progressing   Problem: RH SKIN INTEGRITY Goal: RH STG SKIN FREE OF INFECTION/BREAKDOWN Description: Max assist Outcome: Progressing Goal: RH STG MAINTAIN SKIN INTEGRITY WITH ASSISTANCE Description: STG Maintain Skin Integrity With mas Assistance. Outcome: Progressing Goal: RH STG ABLE TO PERFORM INCISION/WOUND CARE W/ASSISTANCE Description: STG Able To Perform Incision/Wound Care With max Assistance. Outcome: Progressing   Problem: RH SAFETY Goal: RH STG ADHERE TO SAFETY PRECAUTIONS W/ASSISTANCE/DEVICE Description: STG Adhere to Safety Precautions With min Assistance/Device. Outcome: Progressing Goal: RH STG DECREASED RISK OF FALL WITH ASSISTANCE Description: STG Decreased Risk of Fall With min Assistance. Outcome: Progressing   Problem: RH COGNITION-NURSING Goal: RH STG USES MEMORY AIDS/STRATEGIES W/ASSIST TO PROBLEM SOLVE Description: STG Uses Memory Aids/Strategies With min Assistance to Problem Solve. Outcome: Progressing Goal: RH STG ANTICIPATES NEEDS/CALLS  FOR ASSIST W/ASSIST/CUES Description: STG Anticipates Needs/Calls for Assist With min Assistance/Cues. Outcome: Progressing   Problem: RH PAIN MANAGEMENT Goal: RH STG PAIN MANAGED AT OR BELOW PT'S PAIN GOAL Description: Less than 3 Outcome: Progressing   Problem: RH KNOWLEDGE DEFICIT Goal: RH STG INCREASE KNOWLEDGE OF HYPERTENSION Description: Patient able to describe management of hypertension with cues/handouts Outcome: Progressing Goal: RH STG INCREASE KNOWLEDGE OF STROKE PROPHYLAXIS Description: Patient able to describe prophylaxis to avoid stroke with cues/handout Outcome: Progressing   

## 2019-01-17 NOTE — Progress Notes (Signed)
Physical Therapy Session Note  Patient Details  Name: Carlos Ferguson MRN: [id number] Date of Birth: 1965-06-18  Today's Date: 01/17/2019 PT Individual Time: 0900-1000 PT Individual Time Calculation (min): 60 min   Short Term Goals: Week 3:  PT Short Term Goal 1 (Week 3): =LTG due to ELOS  Skilled Therapeutic Interventions/Progress Updates:    Pt received seated in bed, agreeable to PT session. No complaints of pain. Pt is dependent to don shoes and max A to don pants most of the way. Sitting in bed to sitting EOB with Supervision. Sit to stand to stedy to pull up pants dependently. Stedy transfer to w/c. Manual w/c propulsion 2 x 150 ft with use of BUE and Supervision. As therapist is setting up slide board transfer from w/c to mat table pt performs squat pivot transfer with CGA with 4" step under BLE. Seated core strengthening therex while seated on EOM with BLE support on 4" step: unweighted ball punch-outs 2 x 10 reps, 1 kg weighted ball diagonals 2 x 10 reps L and R. Squat pivot transfer back to w/c with min A. Slide board transfer w/c to bed with CGA. Sit to supine Supervision. Pt left seated in bed with needs in reach at end of session.  Therapy Documentation Precautions:  Precautions Precautions: Fall Restrictions Weight Bearing Restrictions: No    Therapy/Group: Individual Therapy   Excell Seltzer, PT, DPT  01/17/2019, 12:55 PM

## 2019-01-17 NOTE — Progress Notes (Signed)
0Occupational Therapy Session Note  Patient Details  Name: Carlos Ferguson MRN: [id number] Date of Birth: 1965/05/11  Today's Date: 01/17/2019 OT Individual Time: 1300-1340 OT Individual Time Calculation (min): 40 min    Short Term Goals: Week 3:  OT Short Term Goal 1 (Week 3): STG=LTG awaiting SNF placement  Skilled Therapeutic Interventions/Progress Updates:    Pt seen for OT session focusing on functional mobility and acitivity tolerance. Pt awake sitting up in bed upon arrival, voiced increased fatigue from previou sessions, no other complaints of pain. Shoes donned total A bed level. He transferred to EOB with supervision using hospital bed functions. Sliding board transfer to w/c with guarding assist and VCs for technique and assist to place board.  Grooming tasks completed from w/c level at sink. Attempted sit>stand in STEDY to cont to work towards safe method for shower transfer. He required max A to stand. Limited by L LE required to have AFO in locked position, therefore only WBing through L heel and unable to assist in powering up. Pt able to safely sit on paddles of STEDY and max A to stand from perched position to return to w/c. Will need to practice again tomorrow when pt does not have on AFOs as he will not when completing shower transfer.  Pt self-propelled w/c back to room for UE strengthening/endurance. Completed sliding board transfer back to bed in same manner as described above.  Pt left in supine at end of session, all needs in reach and bed alarm on.   Therapy Documentation Precautions:  Precautions Precautions: Fall Restrictions Weight Bearing Restrictions: No   Therapy/Group: Individual Therapy  Gerik Coberly L 01/17/2019, 6:57 AM

## 2019-01-17 NOTE — Progress Notes (Signed)
[place] PHYSICAL MEDICINE & REHABILITATION PROGRESS NOTE   Subjective/Complaints:   Pt reports is super dizzy again this AM- also notes has had problems since admission with his sense of taste- some things just don't taste right- for ex- Coke tastes bitter, which is his favorite drink. It's unpredictable.  HA better this AM- had an unexpected large BM this AM as well.  ROS: Denies CP, shortness of breath, nausea, vomiting..  Objective:   No results found. Recent Labs    01/16/19 0931  WBC 5.2  HGB 15.5  HCT 43.4  PLT 200   Recent Labs    01/16/19 0931  NA 141  K 3.7  CL 105  CO2 28  GLUCOSE 96  BUN 13  CREATININE 0.66  CALCIUM 9.0    Intake/Output Summary (Last 24 hours) at 01/17/2019 1321 Last data filed at 01/17/2019 1048 Gross per 24 hour  Intake 940 ml  Output 715 ml  Net 225 ml     Physical Exam: Vital Signs Blood pressure 130/68, pulse 72, temperature 98 F (36.7 C), resp. rate 16, height 4\' 9"  (1.448 m), weight 65 kg, SpO2 97 %. Constitutional: No distress . Vital signs reviewed. Lying on side in bed, trying to stop dizziness, PT at side, NAD HENT: Normocephalic.  Atraumatic. Head tender on occiput still, but no HA Eyes: EOMI. However has a lot of nystagmus horizontally only B/L Cardiovascular: No JVD. Respiratory: Normal effort.  No stridor. GI: Non-distended. Skin: Healed incisions. Psych: Normal mood.  Normal behavior. Musc: LE contractures Neurological: Alert Dysarthria Able to follow commands Motor: Bilateral upper extremities: 4+/5 proximal distal, stable No movement noted in bilateral lower extremities, stable  Assessment/Plan: 1. 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  Physiatrist is providing close  team supervision and 24 hour management of active medical problems listed below.  Physiatrist and rehab team continue to assess barriers to discharge/monitor patient progress toward functional and medical goals  Care Tool:  Bathing    Body parts bathed by patient: Right arm, Left arm, Chest, Abdomen, Right upper leg, Left upper leg, Right lower leg, Left lower leg, Face   Body parts bathed by helper: Buttocks Body parts n/a: Front perineal area, Buttocks(Pt declined this session)   Bathing assist Assist Level: Supervision/Verbal cueing     Upper Body Dressing/Undressing Upper body dressing   What is the patient wearing?: Pull over shirt    Upper body assist Assist Level: Supervision/Verbal cueing    Lower Body Dressing/Undressing Lower body dressing      What is the patient wearing?: Incontinence brief, Pants     Lower body assist Assist for lower body dressing: Minimal Assistance - Patient > 75%     Toileting Toileting    Toileting assist Assist for toileting: Dependent - Patient 0%     Transfers Chair/bed transfer  Transfers assist  Chair/bed transfer activity did not occur: Safety/medical concerns  Chair/bed transfer assist level: Contact Guard/Touching assist     Locomotion Ambulation   Ambulation assist   Ambulation activity did not occur: Safety/medical concerns          Walk 10 feet activity   Assist  Walk 10 feet activity did not occur: Safety/medical concerns        Walk 50 feet activity   Assist Walk  50 feet with 2 turns activity did not occur: Safety/medical concerns         Walk 150 feet activity   Assist Walk 150 feet activity did not occur: Safety/medical concerns         Walk 10 feet on uneven surface  activity   Assist Walk 10 feet on uneven surfaces activity did not occur: Safety/medical concerns         Wheelchair     Assist Will patient use wheelchair at discharge?: Yes Type of Wheelchair:  Manual Wheelchair activity did not occur: Safety/medical concerns  Wheelchair assist level: Supervision/Verbal cueing Max wheelchair distance: 150    Wheelchair 50 feet with 2 turns activity    Assist    Wheelchair 50 feet with 2 turns activity did not occur: Safety/medical concerns   Assist Level: Supervision/Verbal cueing   Wheelchair 150 feet activity     Assist  Wheelchair 150 feet activity did not occur: Safety/medical concerns   Assist Level: Supervision/Verbal cueing   Blood pressure 130/68, pulse 72, temperature 98 F (36.7 C), resp. rate 16, height 4\' 9"  (1.448 m), weight 65 kg, SpO2 97 %.   Medical Problem List and Plan: 1.  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  Antithrombotics: -DVT/anticoagulation:  Mechanical: Foot pump Bilateral lower extremities             -antiplatelet therapy: N/A due to bleed 3. Headaches/Pain Management: Added Ultram prn.   Increased Depakote ER to 750 mg daily and added norco for when tylneol and tramadol aren't effective.   Added 2 lidoderm patches 8pm to 8am daily for neck tightness, later received per patient preference  Monitor with change in I/O cath schedule 4. Mood: LCSW to follow for evaluation and support.              -antipsychotic agents: N/A 5. Neuropsych: This patient is fully capable of making decisions on his own behalf. 6. Skin/Wound Care: Routine pressure relief measures.  7. Fluids/Electrolytes/Nutrition: Monitor I/Os. Tolerating current diet.  8. Headaches: Persistent but appears to be improving.  Depakote added.  See #3 9. Leucocytosis: Resolved.    WBC 6.6 on 9/16  Continue to monitor 10. Hypokalemia: Continue daily supplement and adjust as needed.    Potassium 5.0 on 9/16, labs ordered for tomorrow  Potassium supplement decreased   11. HTN: Monitor BP tid--continue atenolol               Vitals:   01/16/19 1937  01/17/19 0413  BP: 117/72 130/68  Pulse: 72 72  Resp: 18 16  Temp: 97.8 F (36.6 C) 98 F (36.7 C)  SpO2: 97%    Better control 9/22 12. Seizure disorder: Continue Keppra 1500 mg bid.  Monitor reoccurrence.  No recent seizures, unchanged 15. 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  Overall improved with program  9/22- BM this AM outside bowel program. 16. Attention/fatigue trial Provigil since doesn't raise BP as much 100 mg daily- can likely send out on Ritalin 17. Vertigo/dizziness/nausea-continue to follow   Patient sensitive to medications and will try to avoid lethargy  Benefit with essential oils, continue  9/21- on scopolamine patch- pt questionable if helping, but told PA it was.  9/22- will add meclizine prn for dizziness- let pt know could make sleepy. 18. R hip dysplasia- was due to go to [facility] for surgery-on  hold at present 19.  Post stroke dysphagia:   Advanced to regular texture, thins on 9/11 20.  Neurogenic bladder  Discussed with nursing, change bladder scans to every 4 hours  LOS: 20 days A FACE TO FACE EVALUATION WAS PERFORMED  Eliott Amparan 01/17/2019, 1:21 PM

## 2019-01-18 ENCOUNTER — Inpatient Hospital Stay (HOSPITAL_COMMUNITY): Payer: Medicare Other

## 2019-01-18 ENCOUNTER — Inpatient Hospital Stay (HOSPITAL_COMMUNITY): Payer: Medicare Other | Admitting: Occupational Therapy

## 2019-01-18 ENCOUNTER — Inpatient Hospital Stay (HOSPITAL_COMMUNITY): Payer: Medicare Other | Admitting: Physical Therapy

## 2019-01-18 LAB — URINE CULTURE: Culture: >=100000 — AB

## 2019-01-18 MED ORDER — PSEUDOEPHEDRINE HCL 30 MG PO TABS
30.0000 mg | ORAL_TABLET | Freq: Four times a day (QID) | ORAL | Status: DC | PRN
  Administered 2019-01-18: 30 mg via ORAL
  Filled 2019-01-18 (×3): qty 1

## 2019-01-18 MED ORDER — SULFAMETHOXAZOLE-TRIMETHOPRIM 800-160 MG PO TABS
1.0000 | ORAL_TABLET | Freq: Two times a day (BID) | ORAL | Status: AC
  Administered 2019-01-18 – 2019-01-24 (×14): 1 via ORAL
  Filled 2019-01-18 (×14): qty 1

## 2019-01-18 NOTE — Progress Notes (Signed)
Speech Language Pathology Daily Session Note  Patient Details  Name: Carlos Ferguson MRN: [id number] Date of Birth: 1965-09-02  Today's Date: 01/18/2019 SLP Individual Time: 0901-0930 SLP Individual Time Calculation (min): 29 min  Short Term Goals: Week 3: SLP Short Term Goal 1 (Week 3): Pt will complete semi-complex problem solving tasks with Min A cues. SLP Short Term Goal 2 (Week 3): Given Mod A cues, pt will demonstrate use of 1 compensatory memory strategy to recall basic daily information in 5 out of 10 opportunities. SLP Short Term Goal 3 (Week 3): Pt will utilize memory notebook to answer basic [place] questions regarding information in 5 out of 10 opportunities with Min A cues. SLP Short Term Goal 4 (Week 3): Pt will demonstrate selective attention to task for ~ 30 minutes with min A cues for redirection.  Skilled Therapeutic Interventions:Skilled ST services focused on cognitive skills. Pt demonstrated overall improvement in cognitive skills compared to yesterdays session, chart review confirmed UTI. SLP facilitated problem solving and recall with aid in familiar basic money management task ( counting, displaying and making change) pt required supervision A verbal cues. Pt demonstrated selective attention in 28 min intervals with supervision A verbal cues. Pt was left in room with call bell within reach and bed alarm set. ST recommends to continue skilled ST services.      Pain Pain Assessment Pain Scale: 0-10 Pain Score: 0-No pain  Therapy/Group: Individual Therapy  Zoella Roberti  [facility] 01/18/2019, 9:49 AM

## 2019-01-18 NOTE — Progress Notes (Signed)
Physical Therapy Weekly Progress Note  Patient Details  Name: Carlos Ferguson MRN: [id number] Date of Birth: 06-26-1965  Beginning of progress report period: January 12, 2019 End of progress report period: January 18, 2019  Today's Date: 01/18/2019   Patient has met 0 of 1 short term goals.  STG were set at LTG due to ELOS. Pt is continuing to make progress towards LTG. The plan is for the patient to d/c to SNF as his 52 year old father is not able to provide assist for him upon d/c home. Pt is at Supervision level for bed mobility with hospital bed features, CGA for slide board transfers, Supervision for w/c mobility, and min A to stand to RW. Pt is continuing to work on sitting and standing balance as well as LE strength in order to improve ability to initiate gait training.  Patient continues to demonstrate the following deficits muscle weakness, decreased safety awareness, decreased memory and delayed processing and decreased sitting balance, decreased standing balance, decreased postural control and decreased balance strategies and therefore will continue to benefit from skilled PT intervention to increase functional independence with mobility.  Patient progressing toward long term goals..  Continue plan of care.  PT Short Term Goals Week 3:  PT Short Term Goal 1 (Week 3): =LTG due to ELOS PT Short Term Goal 1 - Progress (Week 3): Progressing toward goal Week 4:  PT Short Term Goal 1 (Week 4): =LTG due to ELOS  Skilled Therapeutic Interventions/Progress Updates:      Therapy Documentation Precautions:  Precautions Precautions: Fall Restrictions Weight Bearing Restrictions: No   Therapy/Group: Individual Therapy   Excell Seltzer, PT, DPT 01/18/2019, 12:29 PM

## 2019-01-18 NOTE — Progress Notes (Signed)
[place] PHYSICAL MEDICINE & REHABILITATION PROGRESS NOTE   Subjective/Complaints:   Pt reports still dizzy this AM, but also c/o congestion in sinuses that's new- HA is better today- dizziness "about the same". Says butt "doing OK"- less painful/uncomfortable.  Also, pharmacy said to change UTI coverage to DS Bactrim- was done.  ROS: Denies CP, shortness of breath, nausea, vomiting..  Objective:   No results found. Recent Labs    01/16/19 0931  WBC 5.2  HGB 15.5  HCT 43.4  PLT 200   Recent Labs    01/16/19 0931  NA 141  K 3.7  CL 105  CO2 28  GLUCOSE 96  BUN 13  CREATININE 0.66  CALCIUM 9.0    Intake/Output Summary (Last 24 hours) at 01/18/2019 1038 Last data filed at 01/18/2019 0808 Gross per 24 hour  Intake 622 ml  Output 1290 ml  Net -668 ml     Physical Exam: Vital Signs Blood pressure 135/77, pulse 75, temperature 98.7 F (37.1 C), resp. rate 18, height 4\' 9"  (1.448 m), weight 65 kg, SpO2 98 %. Constitutional: No distress . Vital signs reviewed. Lying supine in bed-denies HA; nasal voice; therapy in room, NAD HENT: Normocephalic.  Atraumatic.  Eyes: EOMI. Less nystagmus notedL Cardiovascular: RRR Respiratory: CTA B/L- some upper airway sounds, however GI: Non-distended. Skin: Healed incisions. Psych: Normal mood.  Normal behavior. Musc: LE contractures Neurological: Alert Dysarthria Able to follow commands Motor: Bilateral upper extremities: 4+/5 proximal distal, stable No movement noted in bilateral lower extremities, stable  Assessment/Plan: 1. 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  Physiatrist is providing close team supervision and 24 hour management of active medical problems listed  below.  Physiatrist and rehab team continue to assess barriers to discharge/monitor patient progress toward functional and medical goals  Care Tool:  Bathing    Body parts bathed by patient: Right arm, Left arm, Chest, Abdomen, Right upper leg, Left upper leg, Right lower leg, Left lower leg, Face   Body parts bathed by helper: Buttocks Body parts n/a: Front perineal area, Buttocks(Pt declined this session)   Bathing assist Assist Level: Supervision/Verbal cueing     Upper Body Dressing/Undressing Upper body dressing   What is the patient wearing?: Pull over shirt    Upper body assist Assist Level: Supervision/Verbal cueing    Lower Body Dressing/Undressing Lower body dressing      What is the patient wearing?: Incontinence brief, Pants     Lower body assist Assist for lower body dressing: Minimal Assistance - Patient > 75%     Toileting Toileting    Toileting assist Assist for toileting: Dependent - Patient 0%     Transfers Chair/bed transfer  Transfers assist  Chair/bed transfer activity did not occur: Safety/medical concerns  Chair/bed transfer assist level: Contact Guard/Touching assist     Locomotion Ambulation   Ambulation assist   Ambulation activity did not occur: Safety/medical concerns          Walk 10 feet activity   Assist  Walk 10 feet activity did not occur: Safety/medical concerns        Walk 50 feet activity   Assist Walk 50 feet with 2 turns activity did not occur: Safety/medical concerns         Walk 150 feet activity  Assist Walk 150 feet activity did not occur: Safety/medical concerns         Walk 10 feet on uneven surface  activity   Assist Walk 10 feet on uneven surfaces activity did not occur: Safety/medical concerns         Wheelchair     Assist Will patient use wheelchair at discharge?: Yes Type of Wheelchair: Manual Wheelchair activity did not occur: Safety/medical concerns  Wheelchair  assist level: Supervision/Verbal cueing Max wheelchair distance: 150    Wheelchair 50 feet with 2 turns activity    Assist    Wheelchair 50 feet with 2 turns activity did not occur: Safety/medical concerns   Assist Level: Supervision/Verbal cueing   Wheelchair 150 feet activity     Assist  Wheelchair 150 feet activity did not occur: Safety/medical concerns   Assist Level: Supervision/Verbal cueing   Blood pressure 135/77, pulse 75, temperature 98.7 F (37.1 C), resp. rate 18, height 4\' 9"  (1.448 m), weight 65 kg, SpO2 98 %.   Medical Problem List and Plan: 1.  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  Antithrombotics: -DVT/anticoagulation:  Mechanical: Foot pump Bilateral lower extremities             -antiplatelet therapy: N/A due to bleed 3. Headaches/Pain Management: Added Ultram prn.   Increased Depakote ER to 750 mg daily and added norco for when tylneol and tramadol aren't effective.   Added 2 lidoderm patches 8pm to 8am daily for neck tightness, later received per patient preference  Monitor with change in I/O cath schedule 4. Mood: LCSW to follow for evaluation and support.              -antipsychotic agents: N/A 5. Neuropsych: This patient is fully capable of making decisions on his own behalf. 6. Skin/Wound Care: Routine pressure relief measures.  7. Fluids/Electrolytes/Nutrition: Monitor I/Os. Tolerating current diet.  8. Headaches: Persistent but appears to be improving.  Depakote added.  See #3 9. Leucocytosis: Resolved.    WBC 6.6 on 9/16  Continue to monitor 10. Hypokalemia: Continue daily supplement and adjust as needed.    Potassium 5.0 on 9/16, labs ordered for tomorrow  Potassium supplement decreased   11. HTN: Monitor BP tid--continue atenolol               Vitals:   01/17/19 1945 01/18/19 0305  BP: 138/76 135/77  Pulse: 73 75  Resp:  18  Temp: 98.8 F (37.1  C) 98.7 F (37.1 C)  SpO2:  98%   Better control 9/22 12. Seizure disorder: Continue Keppra 1500 mg bid.  Monitor reoccurrence.  No recent seizures, unchanged 15. 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  Overall improved with program  9/22- BM this AM outside bowel program. 16. Attention/fatigue trial Provigil since doesn't raise BP as much 100 mg daily- can likely send out on Ritalin 17. Vertigo/dizziness/nausea-continue to follow   Patient sensitive to medications and will try to avoid lethargy  Benefit with essential oils, continue  9/21- on scopolamine patch- pt questionable if helping, but told PA it was.  9/22- will add meclizine prn for dizziness- let pt know could make sleepy.  9/23- hadn't taken as of yet- reminded him to ask for it. 18. R hip dysplasia- was due to go to [facility] for surgery-on hold at present 19.  Post stroke dysphagia:   Advanced to  regular texture, thins on 9/11 20.  Neurogenic bladder  Discussed with nursing, change bladder scans to every 4 hours  9/23- bladder urgency- asking for caths very frequently-encourage q4 hours  21. Cold/congestion  9/23- added Sudafed 3 mg q6 hours prn.  LOS: 21 days A FACE TO FACE EVALUATION WAS PERFORMED  Markeita Alicia 01/18/2019, 10:38 AM

## 2019-01-18 NOTE — Progress Notes (Signed)
Physical Therapy Session Note  Patient Details  Name: Carlos Ferguson MRN: [id number] Date of Birth: 16-Jul-1965  Today's Date: 01/18/2019 PT Individual Time: 3220-2542; 1100-1155 PT Individual Time Calculation (min): 45 min and 55 min  Short Term Goals: Week 3:  PT Short Term Goal 1 (Week 3): =LTG due to ELOS  Skilled Therapeutic Interventions/Progress Updates:    Session 1: Pt received seated in bed having just finished breakfast, agreeable to PT session. No complaints of pain. Pt is min A to don BLE braces, dependent to don shoes. Pt is min A to don pants with increased time and cues needed. Supine to sitting EOB with Supervision with use of hospital bed features. Sit to stand to RW with min A, pt unable to standing fully upright and per pt he stood in flexed posture prior to admission. Sit to stand x 2 reps to chair in front of patient holding onto armrests. Pt tolerates standing 3 x 45 sec with assist for LLE placement under him in standing. Pt fatigues quickly with standing. Seated balance EOB with BLE support, focus on decreased UE support in sitting reaching across midline and outside BOS with alt UE, close SBA to CGA for sitting balance. Sit to long-sitting in bed with Supervision. Pt left seated in bed with needs in reach, bed alarm in place at end of session.  Session 2: Pt received seated in bed, agreeable to PT session. No complaints of pain. Pt does perseverate on being cathed during session, NT performed bladder scan prior to session and pt was not yet ready for I/O cath. Bed mobility Supervision. Slide board transfer bed to w/c with CGA. Manual w/c propulsion x 150 ft with use of BUE and Supervision. Reviewed management of w/c parts and how to safely setup w/c for slide board transfer to mat table, min to mod cueing required. Slide board transfer w/c to mat table with CGA. Sit to stand x 3 reps to youth RW with min A, mod A for lateral weight shift to get LE fully under him. Pt  tolerates standing about 45 sec max. Pt has urinary incontinence while standing. Slide board transfer back to w/c then back to bed CGA. Sit to supine Supervision. Pt is dependent for pericare and brief change following urinary incontinence. NT notified of accident. Pt left seated in bed with needs in reach, bed alarm in place, dad present at end of session.   Therapy Documentation Precautions:  Precautions Precautions: Fall Restrictions Weight Bearing Restrictions: No    Therapy/Group: Individual Therapy   Excell Seltzer, PT, DPT  01/18/2019, 9:42 AM

## 2019-01-18 NOTE — Progress Notes (Signed)
Occupational Therapy Session Note  Patient Details  Name: Carlos Ferguson MRN: [id number] Date of Birth: 1965-07-04  Today's Date: 01/18/2019 OT Individual Time: 1300-1400 OT Individual Time Calculation (min): 60 min    Short Term Goals: Week 3:  OT Short Term Goal 1 (Week 3): STG=LTG awaiting SNF placement  Skilled Therapeutic Interventions/Progress Updates:    Pt seen for OT ADL bathing/dressing session. Pt in supine upon arrival with father present, agreeable to tx session and denying pain. Pt agreeable with plan to shower today. He completed supervision level sliding board transfer to w/c with assist for set-up and VCs for technique. Stand/squat pivot transfer from w/c to TTB in shower with use of grab bars, TTB set to lowest setting due to pt's height.  He bathed seated on TTB, assist to wash B LEs. He complete pericare/buttock hygiene via lateral leans. Pt reports being unable to see the amount of soap being poured on the washcloth, therefore requiring assist for set-up.  He returned to w/c via sliding board with min A and VCs for technique.  HE completed grooming tasks from w/c level mod I. He donned shirt with set-up. Required +2 assist for sit>Stand in STEDY to complete LB clothing management. Max A to maintain upright standing posture and positioning of L LE in lift while +2 assisted with clothing management.  Pt left seated in w/c at end of session, all needs in reach and father present.    Therapy Documentation Precautions:  Precautions Precautions: Fall Restrictions Weight Bearing Restrictions: No   Therapy/Group: Individual Therapy  [name] L 01/18/2019, 7:19 AM

## 2019-01-19 ENCOUNTER — Inpatient Hospital Stay (HOSPITAL_COMMUNITY): Payer: Medicare Other | Admitting: Physical Therapy

## 2019-01-19 ENCOUNTER — Inpatient Hospital Stay (HOSPITAL_COMMUNITY): Payer: Medicare Other | Admitting: Occupational Therapy

## 2019-01-19 ENCOUNTER — Inpatient Hospital Stay (HOSPITAL_COMMUNITY): Payer: Medicare Other | Admitting: Speech Pathology

## 2019-01-19 LAB — COMPREHENSIVE METABOLIC PANEL
ALT: 32 U/L (ref 0–44)
AST: 15 U/L (ref 15–41)
Albumin: 3.1 g/dL — ABNORMAL LOW (ref 3.5–5.0)
Alkaline Phosphatase: 58 U/L (ref 38–126)
Anion gap: 8 (ref 5–15)
BUN: 12 mg/dL (ref 6–20)
CO2: 25 mmol/L (ref 22–32)
Calcium: 8.7 mg/dL — ABNORMAL LOW (ref 8.9–10.3)
Chloride: 106 mmol/L (ref 98–111)
Creatinine, Ser: 0.72 mg/dL (ref 0.61–1.24)
GFR calc Af Amer: >60 mL/min (ref >60)
GFR calc non Af Amer: >60 mL/min (ref >60)
Glucose, Bld: 91 mg/dL (ref 70–99)
Potassium: 4.2 mmol/L (ref 3.5–5.1)
Sodium: 139 mmol/L (ref 135–145)
Total Bilirubin: 0.3 mg/dL (ref 0.3–1.2)
Total Protein: 6 g/dL — ABNORMAL LOW (ref 6.5–8.1)

## 2019-01-19 LAB — CBC WITH DIFFERENTIAL/PLATELET
Abs Immature Granulocytes: 0.06 10*3/uL (ref 0.00–0.07)
Basophils Absolute: 0 10*3/uL (ref 0.0–0.1)
Basophils Relative: 0 %
Eosinophils Absolute: 0.1 10*3/uL (ref 0.0–0.5)
Eosinophils Relative: 2 %
HCT: 39.6 % (ref 39.0–52.0)
Hemoglobin: 14.1 g/dL (ref 13.0–17.0)
Immature Granulocytes: 1 %
Lymphocytes Relative: 23 %
Lymphs Abs: 1.1 10*3/uL (ref 0.7–4.0)
MCH: 32.9 pg (ref 26.0–34.0)
MCHC: 35.6 g/dL (ref 30.0–36.0)
MCV: 92.5 fL (ref 80.0–100.0)
Monocytes Absolute: 0.5 10*3/uL (ref 0.1–1.0)
Monocytes Relative: 10 %
Neutro Abs: 2.9 10*3/uL (ref 1.7–7.7)
Neutrophils Relative %: 64 %
Platelets: 160 10*3/uL (ref 150–400)
RBC: 4.28 MIL/uL (ref 4.22–5.81)
RDW: 14.3 % (ref 11.5–15.5)
WBC: 4.6 10*3/uL (ref 4.0–10.5)
nRBC: 0 % (ref 0.0–0.2)

## 2019-01-19 NOTE — Progress Notes (Signed)
Occupational Therapy Session Note  Patient Details  Name: Carlos Ferguson MRN: [id number] Date of Birth: 1966-01-16  Today's Date: 01/19/2019 OT Individual Time: 1045-1200 OT Individual Time Calculation (min): 75 min    Short Term Goals: Week 3:  OT Short Term Goal 1 (Week 3): STG=LTG awaiting SNF placement  Skilled Therapeutic Interventions/Progress Updates:    Pt seen for OT session focusing on functional mobility, core and UE strengthening. Pt sitting upright in bed upon arrival, denied pain at rest and agreeable to tx session. He did have complaints of dizziness with mobility, RN made aware and administered medication during session.  He donned shoes in long sitting position with min A, declined any other ADLs this AM.  He completed sliding board transfers throughout session with assist for set-up and guarding assist.  He self propelled w/c throughout unit with supervision for UE strengthening. Seated on EOM, completed core strengthening task, reclining back on wedged pillows and bringing self back into upright position without UE support and then completing ball toss. Completed with supervision, however, required max cuing to keep activity pattern going. UE strengthening seated EOM. Completed x2 sets of 10 of the following exercises using #2 dowel rod.  Chest press, overhead press, bicep curls, rows. Multimodal cuing for proper form and technique.  X1sit>stand from EOM with standard arm chair placed in front of him. Mod A overall to power into standing, unable to achieve upright posture and requiring seated rest break following ~10 seconds.  Pt then noted to have had bladder incontinence despite having been cathed just prior to tx session. Pt returned to room and transitioned back to bed. Brief changed total A. RN and MT made aware of situation and pt left in supine with NT present completing bladder scan.   Therapy Documentation Precautions:  Precautions Precautions:  Fall Restrictions Weight Bearing Restrictions: No   Therapy/Group: Individual Therapy  Brittan Butterbaugh L 01/19/2019, 6:58 AM

## 2019-01-19 NOTE — Progress Notes (Signed)
Speech Language Pathology Daily Session Note  Patient Details  Name: Carlos Ferguson MRN: [id number] Date of Birth: 12/17/1965  Today's Date: 01/19/2019 SLP Individual Time: 1255-1355 SLP Individual Time Calculation (min): 60 min  Short Term Goals: Week 3: SLP Short Term Goal 1 (Week 3): Pt will complete semi-complex problem solving tasks with Min A cues. SLP Short Term Goal 2 (Week 3): Given Mod A cues, pt will demonstrate use of 1 compensatory memory strategy to recall basic daily information in 5 out of 10 opportunities. SLP Short Term Goal 3 (Week 3): Pt will utilize memory notebook to answer basic [place] questions regarding information in 5 out of 10 opportunities with Min A cues. SLP Short Term Goal 4 (Week 3): Pt will demonstrate selective attention to task for ~ 30 minutes with min A cues for redirection.  Skilled Therapeutic Interventions:  Skilled treatment session focused on cognition goals. SLP facilitated session by providing use of memory notebook to answer wh questions. Pt requires Min A cues to answer wh question regarding previous therapy tasks as well as POC regarding medical treatments (such a I/O caths). Pt provides varying responses throughout session. Pt able to demonstrate selective attention for ~ 30 minutes and then became fixated on need to have bladder scanned. About to redirect pt when dietary staff came into room for remainder of session.      Pain    Therapy/Group: Individual Therapy  Quaneisha Hanisch 01/19/2019, 2:46 PM

## 2019-01-19 NOTE — Plan of Care (Signed)
  Problem: Consults Goal: RH STROKE PATIENT EDUCATION Description: See Patient Education module for education specifics  Outcome: Progressing Goal: Nutrition Consult-if indicated Outcome: Progressing   Problem: RH BOWEL ELIMINATION Goal: RH STG MANAGE BOWEL WITH ASSISTANCE Description: STG Manage Bowel with max Assistance. Outcome: Progressing Flowsheets (Taken 01/19/2019 1342) STG: Pt will manage bowels with assistance: 4-Minimum assistance Goal: RH STG MANAGE BOWEL W/MEDICATION W/ASSISTANCE Description: STG Manage Bowel with Medication with max Assistance. Outcome: Progressing Flowsheets (Taken 01/19/2019 1342) STG: Pt will manage bowels with medication with assistance: 4-Minimal assistance   Problem: RH BLADDER ELIMINATION Goal: RH STG MANAGE BLADDER WITH ASSISTANCE Description: STG Manage Bladder With mod Assistance Outcome: Progressing Flowsheets (Taken 01/19/2019 1342) STG: Pt will manage bladder with assistance: 1-Total assistance Goal: RH STG MANAGE BLADDER WITH MEDICATION WITH ASSISTANCE Description: STG Manage Bladder With Medication With mod Assistance. Outcome: Progressing Flowsheets (Taken 01/19/2019 1342) STG: Pt will manage bladder with medication with assistance: 1-Total assistance Goal: RH STG MANAGE BLADDER WITH EQUIPMENT WITH ASSISTANCE Description: STG Manage Bladder With Equipment With mod Assistance Outcome: Progressing   Problem: RH SKIN INTEGRITY Goal: RH STG MAINTAIN SKIN INTEGRITY WITH ASSISTANCE Description: STG Maintain Skin Integrity With mas Assistance. Outcome: Progressing Flowsheets (Taken 01/19/2019 1342) STG: Maintain skin integrity with assistance: 4-Minimal assistance Goal: RH STG ABLE TO PERFORM INCISION/WOUND CARE W/ASSISTANCE Description: STG Able To Perform Incision/Wound Care With max Assistance. Outcome: Progressing Flowsheets (Taken 01/19/2019 1342) STG: Pt will be able to perform incision/wound care with assistance: 4-Minimal  assistance   Problem: RH SAFETY Goal: RH STG ADHERE TO SAFETY PRECAUTIONS W/ASSISTANCE/DEVICE Description: STG Adhere to Safety Precautions With min Assistance/Device. Outcome: Progressing Flowsheets (Taken 01/19/2019 1342) STG:Pt will adhere to safety precautions with assistance/device: 3-Moderate assistance Goal: RH STG DECREASED RISK OF FALL WITH ASSISTANCE Description: STG Decreased Risk of Fall With min Assistance. Outcome: Progressing Flowsheets (Taken 01/19/2019 1342) [id number] risk of fall  with assistance/device: 3-Moderate assistance   Problem: RH COGNITION-NURSING Goal: RH STG USES MEMORY AIDS/STRATEGIES W/ASSIST TO PROBLEM SOLVE Description: STG Uses Memory Aids/Strategies With min Assistance to Problem Solve. Outcome: Progressing Goal: RH STG ANTICIPATES NEEDS/CALLS FOR ASSIST W/ASSIST/CUES Description: STG Anticipates Needs/Calls for Assist With min Assistance/Cues. Outcome: Progressing Flowsheets (Taken 01/19/2019 1342) STG: Anticipates needs/calls for assistance with assistance/cues: 4-Minimal assistance   Problem: RH PAIN MANAGEMENT Goal: RH STG PAIN MANAGED AT OR BELOW PT'S PAIN GOAL Description: Less than 3 Outcome: Progressing   Problem: RH KNOWLEDGE DEFICIT Goal: RH STG INCREASE KNOWLEDGE OF HYPERTENSION Description: Patient able to describe management of hypertension with cues/handouts Outcome: Progressing Goal: RH STG INCREASE KNOWLEDGE OF STROKE PROPHYLAXIS Description: Patient able to describe prophylaxis to avoid stroke with cues/handout Outcome: Progressing

## 2019-01-19 NOTE — Progress Notes (Signed)
[place] PHYSICAL MEDICINE & REHABILITATION PROGRESS NOTE   Subjective/Complaints:   Pt reports no change in dizziness/vertigo- even with Meclizine prn. No improvement with scopolamine patch either- HA is better- had bladder accident yesterday due to no cathing right on time- planned within 15 minutes- overall everything tastes bitter this AM. Doesn't know why.   ROS: Denies CP, shortness of breath, nausea, vomiting..  Objective:   No results found. Recent Labs    01/16/19 0931 01/19/19 0601  WBC 5.2 4.6  HGB 15.5 14.1  HCT 43.4 39.6  PLT 200 160   Recent Labs    01/16/19 0931 01/19/19 0601  NA 141 139  K 3.7 4.2  CL 105 106  CO2 28 25  GLUCOSE 96 91  BUN 13 12  CREATININE 0.66 0.72  CALCIUM 9.0 8.7*    Intake/Output Summary (Last 24 hours) at 01/19/2019 0905 Last data filed at 01/19/2019 0700 Gross per 24 hour  Intake 670 ml  Output 1275 ml  Net -605 ml     Physical Exam: Vital Signs Blood pressure 122/80, pulse 75, temperature 98 F (36.7 C), temperature source Oral, resp. rate 18, height 4\' 9"  (1.448 m), weight 65 kg, SpO2 97 %. Constitutional: No distress . Vital signs reviewed. Lying supine in bed-denies HA; afebrile; bright affect;  NAD HENT: Normocephalic.  Atraumatic.  Eyes: EOMI. Less nystagmus noted on exam Cardiovascular: RRR Respiratory: CTA B/L- GI: Non-distended. Skin: Healed incisions. Psych:bright affect; says food tastes bitter. Musc: LE contractures Neurological: Alert Dysarthria Able to follow commands Motor: Bilateral upper extremities: 4+/5 proximal distal, stable No movement noted in bilateral lower extremities, stable  Assessment/Plan: 1. 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  Physiatrist  is providing close team supervision and 24 hour management of active medical problems listed below.  Physiatrist and rehab team continue to assess barriers to discharge/monitor patient progress toward functional and medical goals  Care Tool:  Bathing    Body parts bathed by patient: Right arm, Left arm, Chest, Abdomen, Right upper leg, Left upper leg, Face, Front perineal area, Buttocks   Body parts bathed by helper: Right lower leg, Left lower leg Body parts n/a: Front perineal area, Buttocks(Pt declined this session)   Bathing assist Assist Level: Minimal Assistance - Patient > 75%     Upper Body Dressing/Undressing Upper body dressing   What is the patient wearing?: Pull over shirt    Upper body assist Assist Level: Set up assist    Lower Body Dressing/Undressing Lower body dressing      What is the patient wearing?: Incontinence brief, Pants     Lower body assist Assist for lower body dressing: 2 Helpers     Toileting Toileting    Toileting assist Assist for toileting: Dependent - Patient 0%     Transfers Chair/bed transfer  Transfers assist  Chair/bed transfer activity did not occur: Safety/medical concerns  Chair/bed transfer assist level: Contact Guard/Touching assist     Locomotion Ambulation   Ambulation assist   Ambulation activity did not occur: Safety/medical concerns          Walk 10 feet activity   Assist  Walk 10 feet activity did not occur: Safety/medical concerns        Walk 50 feet activity   Assist Walk 50 feet with  2 turns activity did not occur: Safety/medical concerns         Walk 150 feet activity   Assist Walk 150 feet activity did not occur: Safety/medical concerns         Walk 10 feet on uneven surface  activity   Assist Walk 10 feet on uneven surfaces activity did not occur: Safety/medical concerns         Wheelchair     Assist Will patient use wheelchair at discharge?: Yes Type of  Wheelchair: Manual Wheelchair activity did not occur: Safety/medical concerns  Wheelchair assist level: Supervision/Verbal cueing Max wheelchair distance: 150    Wheelchair 50 feet with 2 turns activity    Assist    Wheelchair 50 feet with 2 turns activity did not occur: Safety/medical concerns   Assist Level: Supervision/Verbal cueing   Wheelchair 150 feet activity     Assist  Wheelchair 150 feet activity did not occur: Safety/medical concerns   Assist Level: Supervision/Verbal cueing   Blood pressure 122/80, pulse 75, temperature 98 F (36.7 C), temperature source Oral, resp. rate 18, height 4\' 9"  (1.448 m), weight 65 kg, SpO2 97 %.   Medical Problem List and Plan: 1.  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  Antithrombotics: -DVT/anticoagulation:  Mechanical: Foot pump Bilateral lower extremities             -antiplatelet therapy: N/A due to bleed 3. Headaches/Pain Management: Added Ultram prn.   Increased Depakote ER to 750 mg daily and added norco for when tylneol and tramadol aren't effective.   Added 2 lidoderm patches 8pm to 8am daily for neck tightness, later received per patient preference  Monitor with change in I/O cath schedule 4. Mood: LCSW to follow for evaluation and support.              -antipsychotic agents: N/A 5. Neuropsych: This patient is fully capable of making decisions on his own behalf. 6. Skin/Wound Care: Routine pressure relief measures.  7. Fluids/Electrolytes/Nutrition: Monitor I/Os. Tolerating current diet.  8. Headaches: Persistent but appears to be improving.  Depakote added.  See #3 9. Leucocytosis: Resolved.    WBC 6.6 on 9/16  Continue to monitor 10. Hypokalemia: Continue daily supplement and adjust as needed.    Potassium 5.0 on 9/16, labs ordered for tomorrow  Potassium supplement decreased   11. HTN: Monitor BP tid--continue atenolol                Vitals:   01/18/19 1941 01/19/19 0346  BP: 138/85 122/80  Pulse: 93 75  Resp: 17 18  Temp: 98.6 F (37 C) 98 F (36.7 C)  SpO2: 96% 97%   Better control 9/22 12. Seizure disorder: Continue Keppra 1500 mg bid.  Monitor reoccurrence.  No recent seizures, unchanged 15. 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  Overall improved with program  9/22- BM this AM outside bowel program. 16. Attention/fatigue trial Provigil since doesn't raise BP as much 100 mg daily- can likely send out on Ritalin 17. Vertigo/dizziness/nausea-continue to follow   Patient sensitive to medications and will try to avoid lethargy  Benefit with essential oils, continue  9/21- on scopolamine patch- pt questionable if helping, but told PA it was.  9/22- will add meclizine prn for dizziness- let pt know could make sleepy.  9/23- hadn't taken as of yet- reminded him to ask for  it.  9/24- no improvement with Meclizine- not sure we have any other choices- will assess 18. R hip dysplasia- was due to go to [facility] for surgery-on hold at present 19.  Post stroke dysphagia:   Advanced to regular texture, thins on 9/11 20.  Neurogenic bladder  Discussed with nursing, change bladder scans to every 4 hours  9/23- bladder urgency- asking for caths very frequently-encourage q4 hours   9/24- bladder accident yesterday- will make sure cathed q4 21. Cold/congestion  9/23- added Sudafed 3 mg q6 hours prn.  9/24- improved Sx's  LOS: 22 days A FACE TO FACE EVALUATION WAS PERFORMED  Byanka Landrus 01/19/2019, 9:05 AM

## 2019-01-19 NOTE — Progress Notes (Signed)
Physical Therapy Session Note  Patient Details  Name: Carlos Ferguson MRN: [id number] Date of Birth: 1966-02-24  Today's Date: 01/19/2019 PT Individual Time: 0800-0900 PT Individual Time Calculation (min): 60 min   Short Term Goals: Week 4:  PT Short Term Goal 1 (Week 4): =LTG due to ELOS  Skilled Therapeutic Interventions/Progress Updates:    Pt received seated in bed, agreeable to PT session. No complaints of pain. Pt still complains of ongoing dizziness, per MD pt has tried all available medications to treat symptoms with no relief. Pt is min A to don BLE braces while long-sitting in bed. Pt is max A to don pants. Long-sitting to sitting EOB with Supervision. Slide board transfer bed to w/c with CGA. Pt is setup A for oral care at sink. Manual w/c propulsion x 150 ft, x 200 ft with use of BUE at Supervision level. Pt is able to verbalize and demonstrate steps to perform slide board transfer w/c to/from mat table with min cueing. Pt is CGA for slide board transfer w/c to/from mat table. Seated balance EOM with no LE support and no UE support performing volleyball and then seated ball toss. Pt left seated in w/c in room with needs in reach, quick release belt in place at end of session.  Therapy Documentation Precautions:  Precautions Precautions: Fall Restrictions Weight Bearing Restrictions: No    Therapy/Group: Individual Therapy   Excell Seltzer, PT, DPT  01/19/2019, 12:36 PM

## 2019-01-20 ENCOUNTER — Inpatient Hospital Stay (HOSPITAL_COMMUNITY): Payer: Medicare Other

## 2019-01-20 ENCOUNTER — Inpatient Hospital Stay (HOSPITAL_COMMUNITY): Payer: Medicare Other | Admitting: Speech Pathology

## 2019-01-20 ENCOUNTER — Inpatient Hospital Stay (HOSPITAL_COMMUNITY): Payer: Medicare Other | Admitting: Occupational Therapy

## 2019-01-20 MED ORDER — FLEET ENEMA 7-19 GM/118ML RE ENEM: 1.0000 | ENEMA | Freq: Every day | RECTAL | Status: DC | PRN

## 2019-01-20 MED ORDER — MAGNESIUM CITRATE PO SOLN
1.0000 | Freq: Once | ORAL | Status: AC
  Administered 2019-01-20: 1 via ORAL
  Filled 2019-01-20: qty 296

## 2019-01-20 NOTE — Progress Notes (Signed)
[place] PHYSICAL MEDICINE & REHABILITATION PROGRESS NOTE   Subjective/Complaints:   Pt reports dizziness about the same; HA is much better/gone. Got suppository this AM- hasn't worked so far- used Radiation protection practitionerMagic Bullet at home- LBM 2+ days ago.  Willing to drink Mg citrate and see if works- might require enema.  ROS: Denies CP, shortness of breath, nausea, vomiting..  Objective:   No results found. Recent Labs    01/19/19 0601  WBC 4.6  HGB 14.1  HCT 39.6  PLT 160   Recent Labs    01/19/19 0601  NA 139  K 4.2  CL 106  CO2 25  GLUCOSE 91  BUN 12  CREATININE 0.72  CALCIUM 8.7*    Intake/Output Summary (Last 24 hours) at 01/20/2019 1009 Last data filed at 01/20/2019 0543 Gross per 24 hour  Intake 480 ml  Output 1803 ml  Net -1323 ml     Physical Exam: Vital Signs Blood pressure 124/80, pulse 69, temperature 98.8 F (37.1 C), temperature source Oral, resp. rate 16, height 4\' 9"  (1.448 m), weight 65 kg, SpO2 96 %. Constitutional: No distress . Vital signs reviewed. Sitting up in bed; appears more comfortable; bright affect; no signs of BM after suppository NAD HENT: Normocephalic.  Atraumatic.  Eyes: EOMI. Less nystagmus noted on exam Cardiovascular: RRR Respiratory: CTA B/L- GI: Non-distended. Skin: Healed incisions. Psych:bright affect; says food tastes bitter still. Musc: LE contractures Neurological: Alert Dysarthria Able to follow commands Motor: Bilateral upper extremities: 4+/5 proximal distal, stable No movement noted in bilateral lower extremities, stable  Assessment/Plan: 1. 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  Physiatrist is providing close team supervision and 24 hour management of active medical problems listed  below.  Physiatrist and rehab team continue to assess barriers to discharge/monitor patient progress toward functional and medical goals  Care Tool:  Bathing    Body parts bathed by patient: Right arm, Left arm, Chest, Abdomen, Right upper leg, Left upper leg, Face, Front perineal area, Buttocks   Body parts bathed by helper: Right lower leg, Left lower leg Body parts n/a: Front perineal area, Buttocks(Pt declined this session)   Bathing assist Assist Level: Minimal Assistance - Patient > 75%     Upper Body Dressing/Undressing Upper body dressing   What is the patient wearing?: Pull over shirt    Upper body assist Assist Level: Set up assist    Lower Body Dressing/Undressing Lower body dressing      What is the patient wearing?: Incontinence brief, Pants     Lower body assist Assist for lower body dressing: 2 Helpers     Toileting Toileting    Toileting assist Assist for toileting: Dependent - Patient 0%     Transfers Chair/bed transfer  Transfers assist  Chair/bed transfer activity did not occur: Safety/medical concerns  Chair/bed transfer assist level: Contact Guard/Touching assist     Locomotion Ambulation   Ambulation assist   Ambulation activity did not occur: Safety/medical concerns          Walk 10 feet activity   Assist  Walk 10 feet activity did not occur: Safety/medical concerns        Walk 50 feet activity   Assist Walk 50 feet with 2 turns activity did not occur: Safety/medical concerns  Walk 150 feet activity   Assist Walk 150 feet activity did not occur: Safety/medical concerns         Walk 10 feet on uneven surface  activity   Assist Walk 10 feet on uneven surfaces activity did not occur: Safety/medical concerns         Wheelchair     Assist Will patient use wheelchair at discharge?: Yes Type of Wheelchair: Manual Wheelchair activity did not occur: Safety/medical concerns  Wheelchair assist  level: Supervision/Verbal cueing Max wheelchair distance: 150    Wheelchair 50 feet with 2 turns activity    Assist    Wheelchair 50 feet with 2 turns activity did not occur: Safety/medical concerns   Assist Level: Supervision/Verbal cueing   Wheelchair 150 feet activity     Assist  Wheelchair 150 feet activity did not occur: Safety/medical concerns   Assist Level: Supervision/Verbal cueing   Blood pressure 124/80, pulse 69, temperature 98.8 F (37.1 C), temperature source Oral, resp. rate 16, height 4\' 9"  (1.448 m), weight 65 kg, SpO2 96 %.   Medical Problem List and Plan: 1.  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  Antithrombotics: -DVT/anticoagulation:  Mechanical: Foot pump Bilateral lower extremities             -antiplatelet therapy: N/A due to bleed 3. Headaches/Pain Management: Added Ultram prn.   Increased Depakote ER to 750 mg daily and added norco for when tylneol and tramadol aren't effective.   Added 2 lidoderm patches 8pm to 8am daily for neck tightness, later received per patient preference  Monitor with change in I/O cath schedule 4. Mood: LCSW to follow for evaluation and support.              -antipsychotic agents: N/A 5. Neuropsych: This patient is fully capable of making decisions on his own behalf. 6. Skin/Wound Care: Routine pressure relief measures.  7. Fluids/Electrolytes/Nutrition: Monitor I/Os. Tolerating current diet.  8. Headaches: Persistent but appears to be improving.  Depakote added.  See #3 9. Leucocytosis: Resolved.    WBC 6.6 on 9/16  Continue to monitor 10. Hypokalemia: Continue daily supplement and adjust as needed.    Potassium 5.0 on 9/16, labs ordered for tomorrow  Potassium supplement decreased   11. HTN: Monitor BP tid--continue atenolol               Vitals:   01/19/19 2006 01/20/19 0536  BP: 126/69 124/80  Pulse: 81 69  Resp: 16 16   Temp: 99.5 F (37.5 C) 98.8 F (37.1 C)  SpO2: 97% 96%   Better control 9/22 12. Seizure disorder: Continue Keppra 1500 mg bid.  Monitor reoccurrence.  No recent seizures, unchanged 15. 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  Overall improved with program  9/22- BM this AM outside bowel program  9/25- Suppository this AM- will order Mg citrate and enema. 16. Attention/fatigue trial Provigil since doesn't raise BP as much 100 mg daily- can likely send out on Ritalin 17. Vertigo/dizziness/nausea-continue to follow   Patient sensitive to medications and will try to avoid lethargy  Benefit with essential oils, continue  9/21- on scopolamine patch- pt questionable if helping, but told PA it was.  9/22- will add meclizine prn for dizziness- let pt know could make sleepy.  9/23- hadn't taken as of yet- reminded him to ask for it.  9/24- no improvement  with Meclizine- not sure we have any other choices- will assess 18. R hip dysplasia- was due to go to [facility] for surgery-on hold at present 19.  Post stroke dysphagia:   Advanced to regular texture, thins on 9/11 20.  Neurogenic bladder  Discussed with nursing, change bladder scans to every 4 hours  9/23- bladder urgency- asking for caths very frequently-encourage q4 hours   9/24- bladder accident yesterday- will make sure cathed q4 21. Cold/congestion  9/23- added Sudafed 3 mg q6 hours prn.  9/24- improved Sx's  LOS: 23 days A FACE TO FACE EVALUATION WAS PERFORMED  Kamilo Och 01/20/2019, 10:09 AM

## 2019-01-20 NOTE — Progress Notes (Signed)
Occupational Therapy Session Note  Patient Details  Name: Carlos Ferguson MRN: [id number] Date of Birth: [birth year]  Today's Date: 01/20/2019 OT Individual Time: 0900-1015 OT Individual Time Calculation (min): 75 min    Short Term Goals: Week 3:  OT Short Term Goal 1 (Week 3): STG=LTG awaiting SNF placement  Skilled Therapeutic Interventions/Progress Updates:    Patient in bed, ready for therapy session.  States that he is feeling better today but with less energy than usual.  He was incontinent of bowel during LB bathing in bed requiring max A to complete hygiene and change linens.  LB bathing in long sit position with mod a.  LB dressing in LSP max A.  SB transfer bed to w/c with CG/min A.  UB bathing and dressing at w/c level with set up assist, set up for grooming tasks.  Patient able to propel w/c to and from therapy gym, completed UB and core conditioning activities with fair tolerance.  He remained seated in w/c at close of session with seat belt alarm set, call bell and tray table in reach.    Therapy Documentation Precautions:  Precautions Precautions: Fall Restrictions Weight Bearing Restrictions: No General:   Vital Signs:   Pain: Pain Assessment Pain Scale: 0-10 Pain Score: 0-No pain   Therapy/Group: Individual Therapy  Carlos Levering 01/20/2019, 12:17 PM

## 2019-01-20 NOTE — Progress Notes (Signed)
Social Work Patient ID: Carlos Ferguson, male   DOB: [birth year], 53 y.o.   MRN: [id number]   CSW met with pt and his father to update them on team conference discussion and SNF search.  No bed offers yet in [place] and [place] made follow up phone calls with no success yet.  CSW will continue to search for SNF bed and keep pt/father updated.

## 2019-01-20 NOTE — Progress Notes (Signed)
Speech Language Pathology Weekly Progress and Session Note  Patient Details  Name: Carlos Ferguson MRN: [id number] Date of Birth: 16-Aug-1965  Beginning of progress report period: January 13, 2019 End of progress report period: January 20, 2019  Today's Date: 01/20/2019 SLP Individual Time: 0730-0830 SLP Individual Time Calculation (min): 60 min  Short Term Goals: Week 3: SLP Short Term Goal 1 (Week 3): Pt will complete semi-complex problem solving tasks with Min A cues. SLP Short Term Goal 1 - Progress (Week 3): Progressing toward goal SLP Short Term Goal 2 (Week 3): Given Mod A cues, pt will demonstrate use of 1 compensatory memory strategy to recall basic daily information in 5 out of 10 opportunities. SLP Short Term Goal 2 - Progress (Week 3): Met SLP Short Term Goal 3 (Week 3): Pt will utilize memory notebook to answer basic [place] questions regarding information in 5 out of 10 opportunities with Min A cues. SLP Short Term Goal 3 - Progress (Week 3): Met SLP Short Term Goal 4 (Week 3): Pt will demonstrate selective attention to task for ~ 30 minutes with min A cues for redirection. SLP Short Term Goal 4 - Progress (Week 3): Met    New Short Term Goals: Week 4: SLP Short Term Goal 1 (Week 4): Pt will complete semi-complex problem solving tasks with Min A cues. SLP Short Term Goal 2 (Week 4): Given Min A cues, pt will demonstrate use of 1 compensatory memory strategy to recall basic daily information in 5 out of 10 opportunities. SLP Short Term Goal 3 (Week 4): Pt will utilize memory notebook to answer basic [place] questions regarding information in 5 out of 10 opportunities with Supervision A cues. SLP Short Term Goal 4 (Week 4): Pt will demonstrate selective attention to task for ~ 30 minutes with Supervision A cues for redirection.  Weekly Progress Updates: Pt has made functional gains and met 3 out of 4 short term goals. Pt is currently Supervision-Min assist for basic cognition, Mod-Max  for semi-complex cognition due to impairments with problem solving and short term memory. Pt is consuming regular diet with thin liquids and has met all dysphagia goals - no longer being targeted. Pt has demonstrated improved selective attention, basic and semi-complex problem solving, and use of compensatory memory strategies. Pt education is ongoing. Pt would continue to benefit from skilled ST while inpatient in order to maximize functional independence and reduce burden of care prior to discharge. Anticipate that pt will need 24/7 supervision at discharge in addition to [place] follow up at next level of care - pt is currently pending SNF placement.       Intensity: Minumum of 1-2 x/day, 30 to 90 minutes Frequency: 3 to 5 out of 7 days Duration/Length of Stay: pending SNF placement Treatment/Interventions: Cognitive remediation/compensation;Internal/external aids;Cueing hierarchy;Environmental controls;Patient/family education;Functional tasks;Therapeutic Activities   Daily Session  Skilled Therapeutic Interventions: Pt was seen for skilled ST targeting cognition. Pt was awake and sitting upright, ready to eat breakfast. SLP provided set up assist with breakfast tray, which pt consumed without further assistance for any basic problem solving. In functional conversation, pt recalled overarching goals (cognition) and activities from previous ST sessions with overall Min A question cues and use of memory notebook. SLP also facilitated session with semi-complex daily problem solving scenarios/math problems from the ALFA, during which pt required mostly Mod A verbal and visual cues for problem solving and accuracy in use of written aid and/or calculator (visual impairments noted to impact some of his performance  with these tasks), however Max A required for ~2/10 semi-complex problems. Pt was left in bed with alarm set and all needs within reach, MD present to conduct daily round/assessment with pt. Continue  per current plan of care.      Pain Pain Assessment Pain Score: 0-No pain  Therapy/Group: Individual Therapy  Arbutus Leas 01/20/2019, 8:52 AM

## 2019-01-20 NOTE — Progress Notes (Addendum)
Physical Therapy Session Note  Patient Details  Name: Carlos Ferguson MRN: [id number] Date of Birth: 30-May-1965  Today's Date: 01/20/2019 PT Individual Time: 8295-6213 and 1131-1202 PT Individual Time Calculation (min): 30 min and 31 min   Short Term Goals: Week 1:  PT Short Term Goal 1 (Week 1): Pt will maintain sitting balance EOB with mod A PT Short Term Goal 1 - Progress (Week 1): Met PT Short Term Goal 2 (Week 1): Pt will complete least restrictive transfer with assist x 1 consistently PT Short Term Goal 2 - Progress (Week 1): Met PT Short Term Goal 3 (Week 1): Pt will initiate w/c mobility PT Short Term Goal 3 - Progress (Week 1): Met Week 2:  PT Short Term Goal 1 (Week 2): Pt will perform least restrictive transfer with CGA consistently PT Short Term Goal 1 - Progress (Week 2): Met PT Short Term Goal 2 (Week 2): Pt will propel w/c x 150 ft with BUE and CGA PT Short Term Goal 2 - Progress (Week 2): Met PT Short Term Goal 3 (Week 2): Pt will tolerate sitting up in w/c x 2 hours PT Short Term Goal 3 - Progress (Week 2): Progressing toward goal Week 3:  PT Short Term Goal 1 (Week 3): =LTG due to ELOS PT Short Term Goal 1 - Progress (Week 3): Progressing toward goal Week 4:  PT Short Term Goal 1 (Week 4): =LTG due to ELOS  Skilled Therapeutic Interventions/Progress Updates:  AM SESSION:  PAIN R hip 3/10 which he states is baseline, therapy to tolerance.    Pt initially supine, declined OOB stating that he had just transferred back to bed after long period OOB in wc.   Supine to long sit on edge of bed w/supervision Sitting balance/strengtheing activities including: Seated overhead press w/3lb bar 2x15 Seated rows 3lb bars 3x10 Pt required cga to min assist for balance w/unsupported sitting activities due to mild post/L tendency.  Pt scooted to edge of bed w/supervision and performed ball kick w/RLE w/red theraball and manual resistance applied - 20reps x 1, 15 reps x 1 using  UEs to prop for balance.  Also on edge of bed ball toss /hand back using red theraball (not catch due to balance) 20 reps x 2   Sit to long sit to supine w/supervision.  Pt left supine w/needs in reach, bed alarm set  PM Session PAIN:  3/10 R hip, therapy to tolerance Pt initially seated in bed w/lunch. Bed placed flat and pt rolls L and R w/mod assist for lower body and therapist donning pants. Supine to sit w/hob flat w/supervision.  Patient able to verbally instruct therapist on set up and assist for bed to wc and wc to bed transfer using sliding board.  Performed transfer w/cga.  wc propulsion 155f x 2 using bilat UE's  Room to/from gym.  UBE for cardiovascular endurance 450m x 2 (total 8 min) w/30sec rest break at 4 min.  RPE 7/10  wc pushups x 2, but unable to continue due to c/o fatigue.  Pt returned to room and remained OOB in wc w/call bell/needs in reach.  Agreed to call for assist when ready to return to bed.   Overall progress has been slow and pt sometimes demonstrates self limiting behavior vs. Fluctuating activity tolerance.  Sitting balance has improved and pt able to instruct therapist w/steps for set up and assist w/sliding board transfer today.  Declined standing activity.      Therapy Documentation Precautions:  Precautions Precautions: Fall Restrictions Weight Bearing Restrictions: No   Therapy/Group: Individual Therapy Callie Fielding, PT

## 2019-01-20 NOTE — Patient Care Conference (Signed)
Inpatient RehabilitationTeam Conference and Plan of Care Update Date: 01/18/2019   Time:  9:35 AM   Patient Name: Carlos Ferguson      Medical Record Number: [id number]  Date of Birth: 02-11-1966 Sex: Male         Room/Bed: 4M10C/4M10C-01 Payor Info: Payor: MEDICARE / Plan: MEDICARE PART A AND B / Product Type: *No Product type* /    Admit Date/Time:  12/28/2018  1:27 PM  Primary Diagnosis:  Intracranial hemorrhage, cerebellar [facility])  Patient Active Problem List   Diagnosis Date Noted  . Vertigo   . Frequent headaches 01/11/2019  . Dysphagia, post-stroke   . 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 Neurogenic bowel   . Neurogenic bladder, flaccid   . Essential hypertension 12/28/2018  . Obesity 12/28/2018  . Spina bifida ([place]) 12/28/2018  . Urinary retention 12/28/2018  . Seizures ([place]) 12/28/2018  . Hypokalemia 12/28/2018  . Intracranial hemorrhage, cerebellar ([place]) 12/28/2018  . Cerebellar bleed ([place])   . Leukocytosis   . Cytotoxic brain edema ([place]) 12/27/2018  . ICH (intracerebral hemorrhage) (HCC) L cerebellar, etiology ? HTN 12/25/2018    Expected Discharge Date: Expected Discharge Date: (SNF)  Team Members Present: Physician leading conference: Dr. Courtney Heys Social Worker Present: Alfonse Alpers, LCSW Nurse Present: Rayetta Pigg, RN PT Present: Excell Seltzer, PT OT Present: Amy Rounds, OT SLP Present: Charolett Bumpers, SLP PPS Coordinator present : Gunnar Fusi, SLP     Current Status/Progress Goal Weekly Team Focus  Bowel/Bladder   Pt is incontinent of bowel. Pt is I&O self cath Q4. LBM 01/17/2019.  Encourage timed toileting. Maintain regular bowel pattern.  Assist with toileting needs PRN.   Swallow/Nutrition/ Hydration             ADL's   Supervision/set-up sliding board transfers, Set-up UB bathing/dressing; min A LB bathing, mod A LB dressing  Downgraded LB dressing to min A, Bathing with supervision bed  level, Set-up UB bathing/dressing  ADL re-training, functional transfers, Functional strengthening and activity tolerance   Mobility   S bed mobility, CGA slide board transfer, S w/c mobility, min A sit to stand to RW or with BUE support  Supervision overall  standing, balance, transfers   Communication             Safety/Cognition/ Behavioral Observations  Min-Supervision A  Supervision A  semi-complex problem solving, utilization of memory strategies and selective attention   Pain   No complaints of pain.  Reman pain free.  Assess pain Q shift and PRN.   Skin   Pt has MASD to groin and rash to buttocks bilat.  Treat skin issues per orders to promote healing.  Assess skin Q shift and PRN.    Rehab Goals Patient on target to meet rehab goals: Yes Rehab Goals Revised: none *See Care Plan and progress notes for long and short-term goals.     Barriers to Discharge  Current Status/Progress Possible Resolutions Date Resolved   Nursing                  PT  Decreased caregiver support;Home environment access/layout                 OT                  SLP                SW  Discharge Planning/Teaching Needs:  Plan has changed to SNF as father cannot provide needed level of care.  Defer to next venue.   Team Discussion:  Pt still with headache, but MD does not want to change meds yet.  Trying sudafed PRN and treating UTI.  Pt was started on meds for dizziness.  Pt is supervision for slide board transfers and min A for ADLs at bed level.  Pt is S/CGA overall for PT and can stand with RW and min A.  Pt met swallow goals and is min A/S for cognition.  ST working on recall strategies, semi complex problem solving.  CSW is pursuing SNF.  Revisions to Treatment Plan:  none    Medical Summary Current Status: HA issues, dizziness due to stroke- started meclizine; spina bifida still complicating things Weekly Focus/Goal: UTI symptom treatment and treating dizziness   Barriers to Discharge: Decreased family/caregiver support;Home enviroment access/layout;Incontinence;Neurogenic Bowel & Bladder;Medical stability;Wound care  Barriers to Discharge Comments: n/a Possible Resolutions to Barriers: bowel program/treat dizziness?   Continued Need for Acute Rehabilitation Level of Care: The patient requires daily medical management by a physician with specialized training in physical medicine and rehabilitation for the following reasons: Direction of a multidisciplinary physical rehabilitation program to maximize functional independence : Yes Medical management of patient stability for increased activity during participation in an intensive rehabilitation regime.: Yes Analysis of laboratory values and/or radiology reports with any subsequent need for medication adjustment and/or medical intervention. : Yes   I attest that I was present, lead the team conference, and concur with the assessment and plan of the team.Team conference was held via web/ teleconference due to [place] - 19.   Trashaun Streight, Silvestre Mesi 01/20/2019, 10:32 PM

## 2019-01-21 ENCOUNTER — Inpatient Hospital Stay (HOSPITAL_COMMUNITY): Payer: Medicare Other | Admitting: Speech Pathology

## 2019-01-21 ENCOUNTER — Inpatient Hospital Stay (HOSPITAL_COMMUNITY): Payer: Medicare Other | Admitting: Physical Therapy

## 2019-01-21 NOTE — Progress Notes (Signed)
Physical Therapy Session Note  Patient Details  Name: Carlos Ferguson MRN: [id number] Date of Birth: February 19, 1966  Today's Date: 01/21/2019 PT Individual Time: 1405-1505 PT Individual Time Calculation (min): 60 min   Short Term Goals: Week 2:  PT Short Term Goal 1 (Week 2): Pt will perform least restrictive transfer with CGA consistently PT Short Term Goal 1 - Progress (Week 2): Met PT Short Term Goal 2 (Week 2): Pt will propel w/c x 150 ft with BUE and CGA PT Short Term Goal 2 - Progress (Week 2): Met PT Short Term Goal 3 (Week 2): Pt will tolerate sitting up in w/c x 2 hours PT Short Term Goal 3 - Progress (Week 2): Progressing toward goal Week 3:  PT Short Term Goal 1 (Week 3): =LTG due to ELOS PT Short Term Goal 1 - Progress (Week 3): Progressing toward goal Week 4:  PT Short Term Goal 1 (Week 4): =LTG due to ELOS  Skilled Therapeutic Interventions/Progress Updates:  Pt supine in bed upon arrival for therapy. Pt agreeable to treatment session. Pt donned pants (with maxA) and shirt in bed. Pt transferred supine>sit with CGA. Pt vitals assessed d/t pt reporting lightheadedness and feeling like he had a temperature. Vitals: Temp: 98.8, SpO2: 98, BP: 114/101. Pt was gripping onto bed rail as BP was being taken. Pt set up with slide board for transfer from bed>wheelchair. Pt transferred to slide board with CGA. Pt self propelled wheelchair 560fet+ from room>outside of hospital. Pt completed sit>standx3 with L KAFO, R AFO, RW, and modA. Pt demonstrated excessive use of bilateral UEs. Pt took seated rest break in between each sit>stand d/t fatigue. Pt self propelled wheelchair 500+ feet from outside of hospital>room. Pt transferred via slide board from wheelchair>bed with CGA. SPT doffed pants, L KAFO, and R AFO. Pt left with tray table to the side of him and call bell within reach. Pt realized he had soiled himself and NT was notified at the end of the session.      Therapy  Documentation Precautions:  Precautions Precautions: Fall Restrictions Weight Bearing Restrictions: No   Pain: pt reported pain in head (4/10)   Therapy/Group: Individual Therapy  KOlena Leatherwood SPT  01/21/2019, 5:33 PM

## 2019-01-21 NOTE — Progress Notes (Signed)
Speech Language Pathology Daily Session Note  Patient Details  Name: Carlos Ferguson MRN: [id number] Date of Birth: 10-07-1965  Today's Date: 01/21/2019 SLP Individual Time: 4097-3532 SLP Individual Time Calculation (min): 40 min  Short Term Goals: Week 4: SLP Short Term Goal 1 (Week 4): Pt will complete semi-complex problem solving tasks with Min A cues. SLP Short Term Goal 2 (Week 4): Given Min A cues, pt will demonstrate use of 1 compensatory memory strategy to recall basic daily information in 5 out of 10 opportunities. SLP Short Term Goal 3 (Week 4): Pt will utilize memory notebook to answer basic [place] questions regarding information in 5 out of 10 opportunities with Supervision A cues. SLP Short Term Goal 4 (Week 4): Pt will demonstrate selective attention to task for ~ 30 minutes with Supervision A cues for redirection.  Skilled Therapeutic Interventions: Patient received skilled SLP services targeting cognitive goals. Patient utilized memory book to respond to -wh questions regarding tasks he completed this week in 8 out of 10 opportunities following mod verbal cues. Patient required max verbal cues to recall 3 compensatory memory strategies. SLP facilitated use of utilizing a calendar as a compensatory memory strategy. Patient required mod verbal cues to complete a task filling a calendar in with appointments and important dates.  At the end of therapy session patient was left upright in bed, bed alarm activated, and all needs within reach.  Pain Pain Assessment Pain Scale: 0-10 Pain Score: 0-No pain  Therapy/Group: Individual Therapy  Cristy Folks 01/21/2019, 10:42 AM

## 2019-01-21 NOTE — Progress Notes (Signed)
[place] PHYSICAL MEDICINE & REHABILITATION PROGRESS NOTE   Subjective/Complaints:   Had results with suppository, oral. Still feels nauseas today  ROS: Patient denies fever, rash, sore throat, blurred vision,  vomiting, diarrhea, cough, shortness of breath or chest pain, joint or back pain, headache, or mood change.    Objective:   No results found. Recent Labs    01/19/19 0601  WBC 4.6  HGB 14.1  HCT 39.6  PLT 160   Recent Labs    01/19/19 0601  NA 139  K 4.2  CL 106  CO2 25  GLUCOSE 91  BUN 12  CREATININE 0.72  CALCIUM 8.7*    Intake/Output Summary (Last 24 hours) at 01/21/2019 0901 Last data filed at 01/21/2019 0730 Gross per 24 hour  Intake 300 ml  Output 1075 ml  Net -775 ml     Physical Exam: Vital Signs Blood pressure 119/67, pulse 93, temperature 98.3 F (36.8 C), temperature source Oral, resp. rate 18, height 4\' 9"  (1.448 m), weight 65 kg, SpO2 98 %. Constitutional: No distress . Vital signs reviewed. HEENT: EOMI, oral membranes moist Neck: supple Cardiovascular: RRR without murmur. No JVD    Respiratory: CTA Bilaterally without wheezes or rales. Normal effort    GI: BS +, non-tender, non-distended  Skin: Healed incisions. Psych:bright affect; says food tastes bitter still. Musc: LE contractures Neurological: Alert Dysarthria Able to follow commands Motor: Bilateral upper extremities: 4+/5 proximal distal, stable No movement noted in bilateral lower extremities, stable  Assessment/Plan: 1. 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  Physiatrist is providing close team supervision and 24 hour management of active medical problems listed below.  Physiatrist and rehab team continue to assess barriers to  discharge/monitor patient progress toward functional and medical goals  Care Tool:  Bathing    Body parts bathed by patient: Right arm, Left arm, Chest, Abdomen, Right upper leg, Left upper leg, Face, Front perineal area, Buttocks   Body parts bathed by helper: Right lower leg, Left lower leg, Buttocks Body parts n/a: Front perineal area, Buttocks(Pt declined this session)   Bathing assist Assist Level: Minimal Assistance - Patient > 75%     Upper Body Dressing/Undressing Upper body dressing   What is the patient wearing?: Pull over shirt    Upper body assist Assist Level: Set up assist    Lower Body Dressing/Undressing Lower body dressing      What is the patient wearing?: Pants, Incontinence brief     Lower body assist Assist for lower body dressing: Maximal Assistance - Patient 25 - 49%     Toileting Toileting    Toileting assist Assist for toileting: Dependent - Patient 0%     Transfers Chair/bed transfer  Transfers assist  Chair/bed transfer activity did not occur: Safety/medical concerns  Chair/bed transfer assist level: Contact Guard/Touching assist     Locomotion Ambulation   Ambulation assist   Ambulation activity did not occur: Safety/medical concerns          Walk 10 feet activity   Assist  Walk 10 feet activity did not occur: Safety/medical concerns        Walk 50 feet activity   Assist Walk 50 feet with 2 turns activity did not occur: Safety/medical concerns         Walk 150  feet activity   Assist Walk 150 feet activity did not occur: Safety/medical concerns         Walk 10 feet on uneven surface  activity   Assist Walk 10 feet on uneven surfaces activity did not occur: Safety/medical concerns         Wheelchair     Assist Will patient use wheelchair at discharge?: Yes Type of Wheelchair: Manual Wheelchair activity did not occur: Safety/medical concerns  Wheelchair assist level: Supervision/Verbal  cueing Max wheelchair distance: 150    Wheelchair 50 feet with 2 turns activity    Assist    Wheelchair 50 feet with 2 turns activity did not occur: Safety/medical concerns   Assist Level: Supervision/Verbal cueing   Wheelchair 150 feet activity     Assist  Wheelchair 150 feet activity did not occur: Safety/medical concerns   Assist Level: Supervision/Verbal cueing   Blood pressure 119/67, pulse 93, temperature 98.3 F (36.8 C), temperature source Oral, resp. rate 18, height 4\' 9"  (1.448 m), weight 65 kg, SpO2 98 %.   Medical Problem List and Plan: 1.  Deficits with mobility, transfers, self-care secondary to left cerebellar hemorrhage with history of strokes  Cont CIR PT, OT, SLP 2.  Antithrombotics: -DVT/anticoagulation:  Mechanical: Foot pump Bilateral lower extremities             -antiplatelet therapy: N/A due to bleed 3. Headaches/Pain Management: Added Ultram prn.   Increased Depakote ER to 750 mg daily and added norco for when tylneol and tramadol aren't effective.   Added 2 lidoderm patches 8pm to 8am daily for neck tightness, later received per patient preference  Monitor with change in I/O cath schedule 4. Mood: LCSW to follow for evaluation and support.              -antipsychotic agents: N/A 5. Neuropsych: This patient is fully capable of making decisions on his own behalf. 6. Skin/Wound Care: Routine pressure relief measures.  7. Fluids/Electrolytes/Nutrition: Monitor I/Os. Tolerating current diet.  8. Headaches: Persistent but appears to be improving.  Depakote added.  See #3 9. Leucocytosis: Resolved.    WBC 6.6 on 9/16  Continue to monitor 10. Hypokalemia: Continue daily supplement and adjust as needed.    Potassium 5.0 on 9/16, labs ordered for tomorrow  Potassium supplement decreased   11. HTN: Monitor BP tid--continue atenolol               Vitals:   01/21/19 0310 01/21/19 0853  BP: 115/73 119/67  Pulse: 67 93  Resp: 18   Temp: 98.3 F  (36.8 C)   SpO2: 98%    Better control 9/26 12. Seizure disorder: Continue Keppra 1500 mg bid.  Monitor reoccurrence.  No recent seizures, unchanged 15. 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  Overall improved with program  9/22- BM this AM outside bowel program  9/25- Suppository ,Mg citrate and enema--> +results  9/26--continue senokot, reassured pt that nausea should improve today 16. Attention/fatigue trial Provigil since doesn't raise BP as much 100 mg daily- can likely send out on Ritalin 17. Vertigo/dizziness/nausea-continue to follow   Patient sensitive to medications and will try to avoid lethargy  Benefit with essential oils, continue  9/21- on scopolamine patch- pt questionable if helping, but told PA it was.  9/22- will add meclizine prn for dizziness- let pt know could make sleepy.  9/23- hadn't taken as of yet- reminded him to ask for  it.  9/24- no improvement with Meclizine- not sure we have any other choices- will assess 18. R hip dysplasia- was due to go to [facility] for surgery-on hold at present 19.  Post stroke dysphagia:   Advanced to regular texture, thins on 9/11 20.  Neurogenic bladder  Discussed with nursing, change bladder scans to every 4 hours  9/23- bladder urgency- asking for caths very frequently-encourage q4 hours   9/24- bladder accident yesterday-  Cath q4 21. Cold/congestion  9/23- added Sudafed 3 mg q6 hours prn.  9/24- improved Sx's  LOS: 24 days A FACE TO FACE EVALUATION WAS PERFORMED  Ranelle Oyster 01/21/2019, 9:01 AM

## 2019-01-22 ENCOUNTER — Inpatient Hospital Stay (HOSPITAL_COMMUNITY): Payer: Medicare Other | Admitting: Physical Therapy

## 2019-01-22 NOTE — Progress Notes (Signed)
[facility] PHYSICAL MEDICINE & REHABILITATION PROGRESS NOTE   Subjective/Complaints:   Nausea better today. Appetite back, pain controlled  ROS: Patient denies fever, rash, sore throat, blurred vision, nausea, vomiting, diarrhea, cough, shortness of breath or chest pain, joint or back pain, headache, or mood change.    Objective:   No results found. No results for input(s): WBC, HGB, HCT, PLT in the last 72 hours. No results for input(s): NA, K, CL, CO2, GLUCOSE, BUN, CREATININE, CALCIUM in the last 72 hours.  Intake/Output Summary (Last 24 hours) at 01/22/2019 1020 Last data filed at 01/22/2019 0834 Gross per 24 hour  Intake 660 ml  Output 895 ml  Net -235 ml     Physical Exam: Vital Signs Blood pressure 139/70, pulse 83, temperature 98.9 F (37.2 C), temperature source Oral, resp. rate 17, height 4\' 9"  (1.448 m), weight 65 kg, SpO2 98 %. Constitutional: No distress . Vital signs reviewed. HEENT: EOMI, oral membranes moist Neck: supple Cardiovascular: RRR without murmur. No JVD    Respiratory: CTA Bilaterally without wheezes or rales. Normal effort    GI: BS +, non-tender, non-distended  Skin: Healed incisions. Psych:bright affect;  cooperative. Musc: LE contractures Neurological: Alert Dysarthria Able to follow commands Motor: Bilateral upper extremities: 4+/5 proximal distal, without change No movement noted in bilateral lower extremities, without change  Assessment/Plan: 1. 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  Physiatrist is providing close team supervision and 24 hour management of active medical problems listed below.  Physiatrist and rehab team continue to assess barriers to discharge/monitor patient progress toward  functional and medical goals  Care Tool:  Bathing    Body parts bathed by patient: Right arm, Left arm, Chest, Abdomen, Right upper leg, Left upper leg, Face, Front perineal area, Buttocks   Body parts bathed by helper: Right lower leg, Left lower leg, Buttocks Body parts n/a: Front perineal area, Buttocks(Pt declined this session)   Bathing assist Assist Level: Minimal Assistance - Patient > 75%     Upper Body Dressing/Undressing Upper body dressing   What is the patient wearing?: Pull over shirt    Upper body assist Assist Level: Set up assist    Lower Body Dressing/Undressing Lower body dressing      What is the patient wearing?: Pants, Incontinence brief     Lower body assist Assist for lower body dressing: Maximal Assistance - Patient 25 - 49%     Toileting Toileting    Toileting assist Assist for toileting: Dependent - Patient 0%     Transfers Chair/bed transfer  Transfers assist  Chair/bed transfer activity did not occur: Safety/medical concerns  Chair/bed transfer assist level: Contact Guard/Touching assist     Locomotion Ambulation   Ambulation assist   Ambulation activity did not occur: Safety/medical concerns          Walk 10 feet activity   Assist  Walk 10 feet activity did not occur: Safety/medical concerns        Walk 50 feet activity   Assist Walk 50 feet with 2 turns activity did not occur: Safety/medical concerns         Walk 150 feet activity   Assist Walk 150 feet activity did not occur: Safety/medical concerns         Walk 10 feet on uneven  surface  activity   Assist Walk 10 feet on uneven surfaces activity did not occur: Safety/medical concerns         Wheelchair     Assist Will patient use wheelchair at discharge?: Yes Type of Wheelchair: Manual Wheelchair activity did not occur: Safety/medical concerns  Wheelchair assist level: Supervision/Verbal cueing Max wheelchair distance: 150     Wheelchair 50 feet with 2 turns activity    Assist    Wheelchair 50 feet with 2 turns activity did not occur: Safety/medical concerns   Assist Level: Supervision/Verbal cueing   Wheelchair 150 feet activity     Assist  Wheelchair 150 feet activity did not occur: Safety/medical concerns   Assist Level: Supervision/Verbal cueing   Blood pressure 139/70, pulse 83, temperature 98.9 F (37.2 C), temperature source Oral, resp. rate 17, height 4\' 9"  (1.448 m), weight 65 kg, SpO2 98 %.   Medical Problem List and Plan: 1.  Deficits with mobility, transfers, self-care secondary to left cerebellar hemorrhage with history of strokes  Cont CIR PT, OT, SLP 2.  Antithrombotics: -DVT/anticoagulation:  Mechanical: Foot pump Bilateral lower extremities             -antiplatelet therapy: N/A due to bleed 3. Headaches/Pain Management: Added Ultram prn.   Increased Depakote ER to 750 mg daily and added norco for when tylneol and tramadol aren't effective.   Added 2 lidoderm patches 8pm to 8am daily for neck tightness, later received per patient preference  Monitor with change in I/O cath schedule 4. Mood: LCSW to follow for evaluation and support.              -antipsychotic agents: N/A 5. Neuropsych: This patient is fully capable of making decisions on his own behalf. 6. Skin/Wound Care: Routine pressure relief measures.  7. Fluids/Electrolytes/Nutrition: Monitor I/Os. Tolerating current diet.  8. Headaches: Persistent but appears to be improving.  Depakote added.  See #3 9. Leucocytosis: Resolved.    WBC 6.6 on 9/16  Continue to monitor 10. Hypokalemia: Continue daily supplement and adjust as needed.    Potassium 5.0 on 9/16,    Potassium supplement decreased   11. HTN: Monitor BP tid--continue atenolol               Vitals:   01/22/19 0417 01/22/19 0824  BP: 119/74 139/70  Pulse: 79 83  Resp: 17   Temp: 98.9 F (37.2 C)   SpO2: 98%    Better control 9/27 12. Seizure  disorder: Continue Keppra 1500 mg bid.  Monitor reoccurrence.  No recent seizures, unchanged 15. 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  Overall improved with program  9/22- BM this AM outside bowel program  9/25- Suppository ,Mg citrate and enema--> +results  9/26--continue senokot, reassured pt that nausea should improve today 16. Attention/fatigue trial Provigil since doesn't raise BP as much 100 mg daily- can likely send out on Ritalin 17. Vertigo/dizziness/nausea-continue to follow   Patient sensitive to medications and will try to avoid lethargy  Benefit with essential oils, continue  9/21- on scopolamine patch- pt questionable if helping, but told PA it was.  9/22- will add meclizine prn for dizziness- let pt know could make sleepy.  9/23- hadn't taken as of yet- reminded him to ask for it.  9/24- no improvement with Meclizine-  18. R hip dysplasia- was due to go to [facility] for surgery-on hold at present 19.  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  Neurogenic bladder  Discussed with nursing, change bladder scans to every 4 hours  9/23- bladder urgency- asking for caths very frequently-encourage q4 hours   9/24- bladder accident yesterday-  Cath q4 21. Cold/congestion  9/23- added Sudafed 3 mg q6 hours prn.  9/24- improved Sx's  LOS: 25 days A FACE TO FACE EVALUATION WAS PERFORMED  Ranelle OysterZachary T Kadence Mikkelson 01/22/2019, 10:20 AM

## 2019-01-22 NOTE — Significant Event (Signed)
Rapid Response Event Note  Overview: Neurologic - Worsening Blurry Vision, Word Finding Difficulty.  Initial Focused Assessment: Called by nurse with concerns of patient having worsening blurry vision coupled with having trouble with getting his words out. Per nurse, this is a change for the patient. I came to Mr. Crusoe on 48M. Patient was sleeping on stomach when I arrived, quickly woke to my voice and was interactive. I asked him to turn over on his back which he was able with ease and we talked. Patient was alert and oriented, able to follow commands and answer my questions, denied headache, weakness, and had no complaints. No new motor and/or sensory deficits, he did however say that his vision is more blurry today than before. Per nurse, VS and BS were normal.   Interventions: -- NO RRT Interventions   Plan of Care: -- RN to notify provider on call. CT is not unreasonable, given that the patient has had a LT cerebellar IPH.  -- Monitor neuro exam and VS as ordered  -- Notify primary service and/or RRT if assistance is needed.    Event Summary:  Call Time 1412 Arrival Time 1415 End Time 1435  Nahjae Hoeg R

## 2019-01-22 NOTE — Progress Notes (Signed)
Physical Therapy Session Note  Patient Details  Name: Carlos Ferguson MRN: [id number] Date of Birth: Aug 05, 1965  Today's Date: 01/22/2019 PT Individual Time: 1400-1400 PT Individual Time Calculation (min): 0 min   Short Term Goals: Week 1:  PT Short Term Goal 1 (Week 1): Pt will maintain sitting balance EOB with mod A PT Short Term Goal 1 - Progress (Week 1): Met PT Short Term Goal 2 (Week 1): Pt will complete least restrictive transfer with assist x 1 consistently PT Short Term Goal 2 - Progress (Week 1): Met PT Short Term Goal 3 (Week 1): Pt will initiate w/c mobility PT Short Term Goal 3 - Progress (Week 1): Met  Skilled Therapeutic Interventions/Progress Updates:  Attempted to see in the pm. Upon entering room, pt's nurse at bedside. Pt's c/o visual disturbances and just not feeling well at all. Nurse to call MD. Discussed with pt's nurse, PT held at this time.   Therapy Documentation Precautions:  Precautions Precautions: Fall Restrictions Weight Bearing Restrictions: No General: PT Amount of Missed Time (min): 30 Minutes PT Missed Treatment Reason: Other (Comment)(pt c/o visual disturbance and not feeling well at all, pt's nurse at bedside)   Therapy/Group: Individual Therapy  Dub Amis 01/22/2019, 2:11 PM

## 2019-01-23 ENCOUNTER — Inpatient Hospital Stay (HOSPITAL_COMMUNITY): Payer: Medicare Other | Admitting: Physical Therapy

## 2019-01-23 ENCOUNTER — Inpatient Hospital Stay (HOSPITAL_COMMUNITY): Payer: Medicare Other | Admitting: Speech Pathology

## 2019-01-23 ENCOUNTER — Inpatient Hospital Stay (HOSPITAL_COMMUNITY): Payer: Medicare Other | Admitting: Occupational Therapy

## 2019-01-23 NOTE — Progress Notes (Signed)
Occupational Therapy Session Note  Patient Details  Name: Carlos Ferguson MRN: [id number] Date of Birth: [birth year]  Today's Date: 01/23/2019 OT Individual Time: 7741-2878 OT Individual Time Calculation (min): 60 min    Short Term Goals: Week 3:  OT Short Term Goal 1 (Week 3): STG=LTG awaiting SNF placement  Skilled Therapeutic Interventions/Progress Updates:    Pt seen for OT ADL bathing/dressing session. Pt sitting upright in bed upon arrival, finishing breakfast and agreeable to tx session. Denied pain, however, voiced need to be cathed. With assist for set-up, pt able to self-cath from bed level. Completed UB/LB bathing routine from long sitting position. Increased time required for positioning and gaze stabilization when pt transitioning from long sitting back to supine in order to complete LB clothing management/change brief. He reports diminished symptoms when using hospital bed functions to assist to coming to sit upright. Assist to wash buttock and don brief, UB and remainder of LB completed with set-up/supervision. UB dressing completed with set-up. Min A for LB dressing to assist with pulling pants up as pt rolled to R/L with use of bed rails. From long sitting position, he donned socks, AFOs and shoes with min A.  Transferred to sitting EOB with supervision. Completed sliding board transfer to w/c with min A for dynamic balance as feet unsuported and steep downhill transfer into chair.  Grooming tasks completed from w/c level mod I at sink. Pt left seated in w/c at end of session, all needs in reach.   Therapy Documentation Precautions:  Precautions Precautions: Fall Restrictions Weight Bearing Restrictions: No   Therapy/Group: Individual Therapy  Burnice Oestreicher L 01/23/2019, 7:01 AM

## 2019-01-23 NOTE — Progress Notes (Signed)
Physical Therapy Session Note  Patient Details  Name: Carlos Ferguson MRN: [id number] Date of Birth: Jul 27, 1965  Today's Date: 01/23/2019 PT Individual Time: 1300-1415 PT Individual Time Calculation (min): 75 min   Short Term Goals: Week 4:  PT Short Term Goal 1 (Week 4): =LTG due to ELOS  Skilled Therapeutic Interventions/Progress Updates:    Pt received seated in bed finishing lunch, agreeable to PT session. No complaints of pain. Bed mobility Supervision. Slide board transfer bed to w/c with close SBA to CGA. Manual w/c propulsion 2 x 150 ft with use of BUE and Supervision. Slide board transfer w/c to mat table with CGA. Sit to supine Supervision. Attempt to have pt perform BLE strengthening therex in supine position. Pt is not able to perform hip abduction due to hip ROM limitations, unable to perform single leg SLR without lifting BLE, and only able to perform heel slides on LLE with AAROM. Pt is able to perform heel slides on LLE x 10 reps with AAROM, SAQ x 10 reps with RLE. Supine LTR x 10 reps with BLE on therapy ball for improved comfort. Supine to long-sit with Supervision. Long-sitting balance ball toss reaching outside BOS with use of BUE, somewhat limited by decreased B shoulder abduction and flexion ROM. Seated balance EOM with BLE supported on step, focus on reaching outside BOS and across midline with alt UE for horseshoes then tossing horseshoes to target, CGA due to pt fear of falling when leaning outside BOS. Pt declines any standing this session due to waiting for I/O cath and not wanting to have a bladder accident. Slide board transfer back to bed with CGA. Sit to longsit in bed with Supervision. Pt left seated in bed with needs in reach at end of session.  Therapy Documentation Precautions:  Precautions Precautions: Fall Restrictions Weight Bearing Restrictions: No    Therapy/Group: Individual Therapy   Excell Seltzer, PT, DPT  01/23/2019, 3:47 PM

## 2019-01-23 NOTE — Plan of Care (Signed)
  Problem: Consults Goal: RH STROKE PATIENT EDUCATION Description: See Patient Education module for education specifics  Outcome: Progressing Goal: Nutrition Consult-if indicated Outcome: Progressing   Problem: RH BOWEL ELIMINATION Goal: RH STG MANAGE BOWEL WITH ASSISTANCE Description: STG Manage Bowel with max Assistance. Outcome: Progressing Goal: RH STG MANAGE BOWEL W/MEDICATION W/ASSISTANCE Description: STG Manage Bowel with Medication with max Assistance. Outcome: Progressing   Problem: RH BLADDER ELIMINATION Goal: RH STG MANAGE BLADDER WITH ASSISTANCE Description: STG Manage Bladder With mod Assistance Outcome: Progressing Goal: RH STG MANAGE BLADDER WITH MEDICATION WITH ASSISTANCE Description: STG Manage Bladder With Medication With mod Assistance. Outcome: Progressing Goal: RH STG MANAGE BLADDER WITH EQUIPMENT WITH ASSISTANCE Description: STG Manage Bladder With Equipment With mod Assistance Outcome: Progressing   Problem: RH SKIN INTEGRITY Goal: RH STG SKIN FREE OF INFECTION/BREAKDOWN Description: Max assist Outcome: Progressing Goal: RH STG MAINTAIN SKIN INTEGRITY WITH ASSISTANCE Description: STG Maintain Skin Integrity With mas Assistance. Outcome: Progressing Goal: RH STG ABLE TO PERFORM INCISION/WOUND CARE W/ASSISTANCE Description: STG Able To Perform Incision/Wound Care With max Assistance. Outcome: Progressing   Problem: RH SAFETY Goal: RH STG ADHERE TO SAFETY PRECAUTIONS W/ASSISTANCE/DEVICE Description: STG Adhere to Safety Precautions With min Assistance/Device. Outcome: Progressing Goal: RH STG DECREASED RISK OF FALL WITH ASSISTANCE Description: STG Decreased Risk of Fall With min Assistance. Outcome: Progressing   Problem: RH COGNITION-NURSING Goal: RH STG USES MEMORY AIDS/STRATEGIES W/ASSIST TO PROBLEM SOLVE Description: STG Uses Memory Aids/Strategies With min Assistance to Problem Solve. Outcome: Progressing Goal: RH STG ANTICIPATES NEEDS/CALLS  FOR ASSIST W/ASSIST/CUES Description: STG Anticipates Needs/Calls for Assist With min Assistance/Cues. Outcome: Progressing   Problem: RH PAIN MANAGEMENT Goal: RH STG PAIN MANAGED AT OR BELOW PT'S PAIN GOAL Description: Less than 3 Outcome: Progressing   Problem: RH KNOWLEDGE DEFICIT Goal: RH STG INCREASE KNOWLEDGE OF HYPERTENSION Description: Patient able to describe management of hypertension with cues/handouts Outcome: Progressing Goal: RH STG INCREASE KNOWLEDGE OF STROKE PROPHYLAXIS Description: Patient able to describe prophylaxis to avoid stroke with cues/handout Outcome: Progressing   

## 2019-01-23 NOTE — Progress Notes (Signed)
Speech Language Pathology Daily Session Note  Patient Details  Name: Carlos Ferguson MRN: [id number] Date of Birth: [birth year]  Today's Date: 01/23/2019 SLP Individual Time: 1001-1100 SLP Individual Time Calculation (min): 59 min  Short Term Goals: Week 4: SLP Short Term Goal 1 (Week 4): Pt will complete semi-complex problem solving tasks with Min A cues. SLP Short Term Goal 2 (Week 4): Given Min A cues, pt will demonstrate use of 1 compensatory memory strategy to recall basic daily information in 5 out of 10 opportunities. SLP Short Term Goal 3 (Week 4): Pt will utilize memory notebook to answer basic [place] questions regarding information in 5 out of 10 opportunities with Supervision A cues. SLP Short Term Goal 4 (Week 4): Pt will demonstrate selective attention to task for ~ 30 minutes with Supervision A cues for redirection.  Skilled Therapeutic Interventions: Pt was seen for skilled ST targeting cognition. Pt was pleasant and engaging upon therapist's arrival; he reported recognizing this SLP's face from previous session last week. Pt independently recalled events of therapy sessions today as well as issues over the weekend with only Supervision A question cues. When SLP introduced complex medication management task, pt also independently recalled functions and dosges of 2 medications he routinely took prior to hospitalization. SLP provided list and review of all current medications and a verbal explanation of how to organize a BID pill box for 1X and 2X daily medications. Pt then organized a BID pill box according to his personal medication list with Min A for error awareness, Mod A for organization and working memory throughout task. During a novel basic card game (11 up) pt identified 2 cards that add up to 11 with initially Mod (~50% of task) faded to Supervision A verbal cues. Pt was left in wheelchair with seatbelt alarm engaged and all needs within reach. Continue per current plan of care.       Pain Pain Assessment Pain Scale: 0-10 Pain Score: 0-No pain  Therapy/Group: Individual Therapy  Arbutus Leas 01/23/2019, 12:31 PM

## 2019-01-23 NOTE — Progress Notes (Signed)
Carlos Ferguson PHYSICAL MEDICINE & REHABILITATION PROGRESS NOTE   Subjective/Complaints:   Had nausea overnight- slightly better this AM, but still wants meds- also still has no appetite- only ate 50% of breakfast- usually cleans plate. Also thinks his cath volumes are down overnight.-  Drinking as much as he can".    ROS: Patient denies fever, rash, sore throat, blurred vision, vomiting, diarrhea, cough, shortness of breath or chest pain, joint or back pain, headache, or mood change.    Objective:   No results found. No results for input(s): WBC, HGB, HCT, PLT in the last 72 hours. No results for input(s): NA, K, CL, CO2, GLUCOSE, BUN, CREATININE, CALCIUM in the last 72 hours.  Intake/Output Summary (Last 24 hours) at 01/23/2019 1307 Last data filed at 01/23/2019 0900 Gross per 24 hour  Intake 640 ml  Output 425 ml  Net 215 ml     Physical Exam: Vital Signs Blood pressure 134/79, pulse 69, temperature 98.6 F (37 C), temperature source Oral, resp. rate 17, height 4\' 9"  (1.448 m), weight 65 kg, SpO2 99 %. Constitutional: No distress . Vital signs reviewed. Sitting up in bed; appears more disheveled than usual, NAD HEENT: EOMI but has intermittent nystagmus, oral membranes moist Neck: supple Cardiovascular: RRR without murmur. No JVD    Respiratory: CTA Bilaterally without wheezes or rales. Normal effort    GI: BS +, non-tender, non-distended  Skin: Healed incisions. Psych:bright affect;  cooperative. Musc: LE contractures Neurological: Alert Dysarthria Able to follow commands Motor: Bilateral upper extremities: 4+/5 proximal distal, without change No movement noted in bilateral lower extremities, without change  Assessment/Plan: 1. Functional deficits secondary to cerebellar hematoma/severe hydrocephalus- due to hypertensive crisis - with a hx of Chiari malformation and VP shunt as well as spina bifida which require 3+ hours per day of interdisciplinary therapy in a  comprehensive inpatient rehab setting.  Physiatrist is providing close team supervision and 24 hour management of active medical problems listed below.  Physiatrist and rehab team continue to assess barriers to discharge/monitor patient progress toward functional and medical goals  Care Tool:  Bathing    Body parts bathed by patient: Right arm, Left arm, Chest, Abdomen, Right upper leg, Left upper leg, Face, Front perineal area, Right lower leg, Left lower leg   Body parts bathed by helper: Buttocks Body parts n/a: Front perineal area, Buttocks(Pt declined this session)   Bathing assist Assist Level: Minimal Assistance - Patient > 75%     Upper Body Dressing/Undressing Upper body dressing   What is the patient wearing?: Pull over shirt    Upper body assist Assist Level: Set up assist    Lower Body Dressing/Undressing Lower body dressing      What is the patient wearing?: Pants, Incontinence brief     Lower body assist Assist for lower body dressing: Moderate Assistance - Patient 50 - 74%     Toileting Toileting    Toileting assist Assist for toileting: Supervision/Verbal cueing(Self-cathing)     Transfers Chair/bed transfer  Transfers assist  Chair/bed transfer activity did not occur: Safety/medical concerns  Chair/bed transfer assist level: Contact Guard/Touching assist     Locomotion Ambulation   Ambulation assist   Ambulation activity did not occur: Safety/medical concerns          Walk 10 feet activity   Assist  Walk 10 feet activity did not occur: Safety/medical concerns        Walk 50 feet activity   Assist Walk 50 feet with 2  turns activity did not occur: Safety/medical concerns         Walk 150 feet activity   Assist Walk 150 feet activity did not occur: Safety/medical concerns         Walk 10 feet on uneven surface  activity   Assist Walk 10 feet on uneven surfaces activity did not occur: Safety/medical  concerns         Wheelchair     Assist Will patient use wheelchair at discharge?: Yes Type of Wheelchair: Manual Wheelchair activity did not occur: Safety/medical concerns  Wheelchair assist level: Supervision/Verbal cueing Max wheelchair distance: 150    Wheelchair 50 feet with 2 turns activity    Assist    Wheelchair 50 feet with 2 turns activity did not occur: Safety/medical concerns   Assist Level: Supervision/Verbal cueing   Wheelchair 150 feet activity     Assist  Wheelchair 150 feet activity did not occur: Safety/medical concerns   Assist Level: Supervision/Verbal cueing   Blood pressure 134/79, pulse 69, temperature 98.6 F (37 C), temperature source Oral, resp. rate 17, height 4\' 9"  (1.448 m), weight 65 kg, SpO2 99 %.   Medical Problem List and Plan: 1.  Deficits with mobility, transfers, self-care secondary to left cerebellar hemorrhage with history of strokes  Cont CIR PT, OT, SLP 2.  Antithrombotics: -DVT/anticoagulation:  Mechanical: Foot pump Bilateral lower extremities             -antiplatelet therapy: N/A due to bleed 3. Headaches/Pain Management: Added Ultram prn.   Increased Depakote ER to 750 mg daily and added norco for when tylneol and tramadol aren't effective.   Added 2 lidoderm patches 8pm to 8am daily for neck tightness, later received per patient preference 4. Mood: LCSW to follow for evaluation and support.              -antipsychotic agents: N/A 5. Neuropsych: This patient is fully capable of making decisions on his own behalf. 6. Skin/Wound Care: Routine pressure relief measures.  7. Fluids/Electrolytes/Nutrition: Monitor I/Os. Tolerating current diet.  8. Headaches: Persistent but appears to be improving.  Depakote added.  See #3 9. Leucocytosis: Resolved.    WBC 6.6 on 9/16  Continue to monitor 10. Hypokalemia: Continue daily supplement and adjust as needed.    Potassium 5.0 on 9/16,    Potassium supplement decreased    11. HTN: Monitor BP tid--continue atenolol               Vitals:   01/22/19 1926 01/23/19 0426  BP: 123/77 134/79  Pulse: 68 69  Resp: 17 17  Temp: 98.6 F (37 C) 98.6 F (37 C)  SpO2: 98% 99%   Better control 9/27 12. Seizure disorder: Continue Keppra 1500 mg bid.  Monitor reoccurrence.  No recent seizures, unchanged 15. 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  Overall improved with program  9/22- BM this AM outside bowel program  9/25- Suppository ,Mg citrate and enema--> +results  9/26--continue senokot, reassured pt that nausea should improve today 16. Attention/fatigue trial Provigil since doesn't raise BP as much 100 mg daily- can likely send out on Ritalin 17. Vertigo/dizziness/nausea-continue to follow   Patient sensitive to medications and will try to avoid lethargy  Benefit with essential oils, continue  9/21- on scopolamine patch- pt questionable if helping, but told PA it was.  9/22- will add meclizine prn for dizziness- let pt know could make sleepy.  9/23-  hadn't taken as of yet- reminded him to ask for it.  9/24- no improvement with Meclizine-   9/28- didn't take over weekend- since not helpful- now having associated nausea- will give Compazine prn. 18. R hip dysplasia- was due to go to [facility] for surgery-on hold at present 19.  Post stroke dysphagia:   Advanced to regular texture, thins on 9/11 20.  Neurogenic bladder  Discussed with nursing, change bladder scans to every 4 hours  9/23- bladder urgency- asking for caths very frequently-encourage q4 hours   9/24- bladder accident yesterday-  Cath q4 21. Cold/congestion  9/23- added Sudafed 3 mg q6 hours prn.  9/24- improved Sx's  LOS: 26 days A FACE TO FACE EVALUATION WAS PERFORMED  Carlos Ferguson 01/23/2019, 1:07 PM

## 2019-01-23 NOTE — Progress Notes (Signed)
Occupational Therapy Weekly Progress Note  Patient Details  Name: Carlos Ferguson MRN: [id number] Date of Birth: August 09, 1965  Beginning of progress report period: January 12, 2019 End of progress report period: January 23, 2019   STG not set as awaiting SNF placement. Cont to address LTG of supervision-min A overall.  Pt cont to make slow but steady progress towards OT goals. He cont to be most limited by severe nystagmus, impacting his ability to complete functional transfers and positioning changes 2/2 dizziness and requiring increased time and rest breaks to acelomate.  Completing bathing/dressing routine from bed level at supervision-min A. During reporting period, attempted to complete LB dressing sit>stand, however, required total A +2 to safely advance pants over hips where as pt is min A at bed level.  Pt remains very motivated and hard working to return to [employer] [facility]. Pt's elderly father is unable to provide the physical assist pt will require at d/c and therefore plan to d/c to SNF in order to cont to increase independence with ADLs and reduce caregiver burden.   Patient continues to demonstrate the following deficits: muscle weakness, decreased cardiorespiratoy endurance, central origin and decreased sitting balance, decreased standing balance, decreased postural control and decreased balance strategies and therefore will continue to benefit from skilled OT intervention to enhance overall performance with BADL and Reduce care partner burden.  Patient progressing toward long term goals..  Continue plan of care. 2OT Short Term Goals Week 3:  OT Short Term Goal 1 (Week 3): STG=LTG awaiting SNF placement OT Short Term Goal 1 - Progress (Week 3): Progressing toward goal Week 4:  OT Short Term Goal 1 (Week 4): Cont to address LTG awaiting SNF placement    Chanin Frumkin L 01/23/2019, 12:56 PM

## 2019-01-24 ENCOUNTER — Inpatient Hospital Stay (HOSPITAL_COMMUNITY): Payer: Medicare Other | Admitting: Occupational Therapy

## 2019-01-24 ENCOUNTER — Inpatient Hospital Stay (HOSPITAL_COMMUNITY): Payer: Medicare Other | Admitting: Physical Therapy

## 2019-01-24 ENCOUNTER — Inpatient Hospital Stay (HOSPITAL_COMMUNITY): Payer: Medicare Other | Admitting: Speech Pathology

## 2019-01-24 NOTE — Progress Notes (Signed)
Speech Language Pathology Daily Session Note  Patient Details  Name: Carlos Ferguson MRN: [id number] Date of Birth: [birth year]  Today's Date: 01/24/2019 SLP Individual Time: 1002-1058 SLP Individual Time Calculation (min): 56 min  Short Term Goals: Week 4: SLP Short Term Goal 1 (Week 4): Pt will complete semi-complex problem solving tasks with Min A cues. SLP Short Term Goal 2 (Week 4): Given Min A cues, pt will demonstrate use of 1 compensatory memory strategy to recall basic daily information in 5 out of 10 opportunities. SLP Short Term Goal 3 (Week 4): Pt will utilize memory notebook to answer basic Burt questions regarding information in 5 out of 10 opportunities with Supervision A cues. SLP Short Term Goal 4 (Week 4): Pt will demonstrate selective attention to task for ~ 30 minutes with Supervision A cues for redirection.  Skilled Therapeutic Interventions: Pt was seen for skilled ST targeting cognition. SLP facilitated session with repetition of complex medication management task to assess carryover since yesterday's targeted session (very little carryover evidenced). Pt used list of medications to identify new vs old medications (since hospitalization) with Supervision A question cues. Pt organized a BID pill box with fluctuating level of cueing required - he could administer 1X daily pills with only Min A verbal cues, however required 2 models and Max A verbal cues to correctly organize 2X daily pill. Specifically, cues were required to recognize and correct errors, and mental flexibility. Pt displayed decreased mental flexibility throughout medication management activity, frequently stating "well that's not how I do it at home" when prompted to practice organizing new dosages of current medications. SLP also facilitated session with familiar semi-complex card game (Blink), which pt independently recalled 2/3 rules for. Mod A verbal cues for use of written aid to recall directions and Min A  question cues for problem solving provided throughout card task. Pt was left sitting in wheelchair with nursing present preparing to cath. Continue per current plan of care.      Pain Pain Assessment Pain Scale: 0-10 Pain Score: 0-No pain  Therapy/Group: Individual Therapy  Arbutus Leas 01/24/2019, 10:58 AM

## 2019-01-24 NOTE — Progress Notes (Signed)
[facility] PHYSICAL MEDICINE & REHABILITATION PROGRESS NOTE   Subjective/Complaints:   Pt reports ate most of breakfast- forced himself to for energy- Has a mild HA, but no improvement in dizziness- doesn't want to try Baclofen for dizziness- doesn't want risk of those side effects.  Says will try to do bowel program again- is scared it will cause diarrhea. Explained that was likely diet/meds, not actual bowel program.   ROS: Patient denies fever, rash, sore throat, blurred vision, vomiting, diarrhea, cough, shortness of breath or chest pain, joint or back pain, headache, or mood change.    Objective:   No results found. No results for input(s): WBC, HGB, HCT, PLT in the last 72 hours. No results for input(s): NA, K, CL, CO2, GLUCOSE, BUN, CREATININE, CALCIUM in the last 72 hours.  Intake/Output Summary (Last 24 hours) at 01/24/2019 1001 Last data filed at 01/24/2019 0858 Gross per 24 hour  Intake 640 ml  Output 750 ml  Net -110 ml     Physical Exam: Vital Signs Blood pressure 127/76, pulse 70, temperature 98 F (36.7 C), resp. rate 19, height 4\' 9"  (1.448 m), weight 65 kg, SpO2 98 %. Constitutional: No distress . Vital signs reviewed. Sitting up in bed; appears more disheveled than usual, NAD HEENT: EOMI but has intermittent nystagmus, oral membranes moist Neck: supple; no trigger points palpated Cardiovascular: RRR without murmur.   Respiratory: CTA Bilaterally without wheezes or rales. Normal effort    GI: BS +, non-tender, non-distended  Skin: Healed incisions. Psych:bright affect;  cooperative. Musc: LE contractures Neurological: Alert Dysarthria Able to follow commands Motor: Bilateral upper extremities: 4+/5 proximal distal, without change No movement noted in bilateral lower extremities, without change  Assessment/Plan: 1. Functional deficits secondary to cerebellar hematoma/severe hydrocephalus- due to hypertensive crisis - with a hx of Chiari malformation and  VP shunt as well as spina bifida which require 3+ hours per day of interdisciplinary therapy in a comprehensive inpatient rehab setting.  Physiatrist is providing close team supervision and 24 hour management of active medical problems listed below.  Physiatrist and rehab team continue to assess barriers to discharge/monitor patient progress toward functional and medical goals  Care Tool:  Bathing    Body parts bathed by patient: Right arm, Left arm, Chest, Abdomen, Right upper leg, Left upper leg, Face, Front perineal area, Right lower leg, Left lower leg   Body parts bathed by helper: Buttocks Body parts n/a: Front perineal area, Buttocks(Pt declined this session)   Bathing assist Assist Level: Minimal Assistance - Patient > 75%     Upper Body Dressing/Undressing Upper body dressing   What is the patient wearing?: Pull over shirt    Upper body assist Assist Level: Set up assist    Lower Body Dressing/Undressing Lower body dressing      What is the patient wearing?: Pants, Incontinence brief     Lower body assist Assist for lower body dressing: Moderate Assistance - Patient 50 - 74%     Toileting Toileting    Toileting assist Assist for toileting: Supervision/Verbal cueing(Self-cathing)     Transfers Chair/bed transfer  Transfers assist  Chair/bed transfer activity did not occur: Safety/medical concerns  Chair/bed transfer assist level: Contact Guard/Touching assist     Locomotion Ambulation   Ambulation assist   Ambulation activity did not occur: Safety/medical concerns          Walk 10 feet activity   Assist  Walk 10 feet activity did not occur: Safety/medical concerns  Walk 50 feet activity   Assist Walk 50 feet with 2 turns activity did not occur: Safety/medical concerns         Walk 150 feet activity   Assist Walk 150 feet activity did not occur: Safety/medical concerns         Walk 10 feet on uneven surface   activity   Assist Walk 10 feet on uneven surfaces activity did not occur: Safety/medical concerns         Wheelchair     Assist Will patient use wheelchair at discharge?: Yes Type of Wheelchair: Manual Wheelchair activity did not occur: Safety/medical concerns  Wheelchair assist level: Supervision/Verbal cueing Max wheelchair distance: 150    Wheelchair 50 feet with 2 turns activity    Assist    Wheelchair 50 feet with 2 turns activity did not occur: Safety/medical concerns   Assist Level: Supervision/Verbal cueing   Wheelchair 150 feet activity     Assist  Wheelchair 150 feet activity did not occur: Safety/medical concerns   Assist Level: Supervision/Verbal cueing   Blood pressure 127/76, pulse 70, temperature 98 F (36.7 C), resp. rate 19, height 4\' 9"  (1.448 m), weight 65 kg, SpO2 98 %.   Medical Problem List and Plan: 1.  Deficits with mobility, transfers, self-care secondary to left cerebellar hemorrhage with history of strokes  Cont CIR PT, OT, SLP 2.  Antithrombotics: -DVT/anticoagulation:  Mechanical: Foot pump Bilateral lower extremities             -antiplatelet therapy: N/A due to bleed 3. Headaches/Pain Management: Added Ultram prn.   Increased Depakote ER to 750 mg daily and added norco for when tylneol and tramadol aren't effective.   Added 2 lidoderm patches 8pm to 8am daily for neck tightness, later received per patient preference 4. Mood: LCSW to follow for evaluation and support.              -antipsychotic agents: N/A 5. Neuropsych: This patient is fully capable of making decisions on his own behalf. 6. Skin/Wound Care: Routine pressure relief measures.  7. Fluids/Electrolytes/Nutrition: Monitor I/Os. Tolerating current diet.  8. Headaches: Persistent but appears to be improving.  Depakote added.  See #3 9. Leucocytosis: Resolved.    WBC 6.6 on 9/16  Continue to monitor 10. Hypokalemia: Continue daily supplement and adjust as  needed.    Potassium 5.0 on 9/16,    Potassium supplement decreased   11. HTN: Monitor BP tid--continue atenolol               Vitals:   01/23/19 1938 01/24/19 0529  BP: 126/88 127/76  Pulse: 80 70  Resp: 16 19  Temp: 98.5 F (36.9 C) 98 F (36.7 C)  SpO2: 95% 98%   Better control 9/27 12. Seizure disorder: Continue Keppra 1500 mg bid.  Monitor reoccurrence.  No recent seizures, unchanged 15. 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  Overall improved with program  9/22- BM this AM outside bowel program  9/25- Suppository ,Mg citrate and enema--> +results  9/26--continue senokot, reassured pt that nausea should improve today  9/29- pt willing to try bowel program again. 16. Attention/fatigue trial Provigil since doesn't raise BP as much 100 mg daily- can likely send out on Ritalin 17. Vertigo/dizziness/nausea-continue to follow   Patient sensitive to medications and will try to avoid lethargy  Benefit with essential oils, continue  9/21- on scopolamine patch- pt questionable if helping, but told PA  it was.  9/22- will add meclizine prn for dizziness- let pt know could make sleepy.  9/23- hadn't taken as of yet- reminded him to ask for it.  9/24- no improvement with Meclizine-   9/28- didn't take over weekend- since not helpful- now having associated nausea- will give Compazine prn.  9/29- doesn't want to try Baclofen- will con't to offer 18. R hip dysplasia- was due to go to [facility] for surgery-on hold at present 19.  Post stroke dysphagia:   Advanced to regular texture, thins on 9/11 20.  Neurogenic bladder  Discussed with nursing, change bladder scans to every 4 hours  9/23- bladder urgency- asking for caths very frequently-encourage q4 hours   9/24- bladder accident yesterday-  Cath q4 21. Cold/congestion  9/23- added Sudafed 3 mg q6 hours prn.  9/24- improved Sx's  LOS: 27 days A FACE TO FACE  EVALUATION WAS PERFORMED  Matison Nuccio 01/24/2019, 10:01 AM

## 2019-01-24 NOTE — Progress Notes (Signed)
Occupational Therapy Session Note  Patient Details  Name: Carlos Ferguson MRN: [id number] Date of Birth: 21-Aug-1965  Today's Date: 01/24/2019 OT Individual Time: 3474-2595 OT Individual Time Calculation (min): 60 min    Short Term Goals: Week 4:  OT Short Term Goal 1 (Week 4): Cont to address LTG awaiting SNF placement  Skilled Therapeutic Interventions/Progress Updates:    Pt seen for OT ADL bathing/dressing session. PT sitting uprgiht in bed upon arrival with RN present adminsitering AM meds. HE denyied pain and agreeable to tx session and plan for bathing at shower level.  Transfers: Completed sliding board transfers throughout session EOB<> w/c and w/c<> tub bench with CGA and VCs for technique.  ADL re-training: Bathed seated on tub transfer bench with min A for washing buttock, supervision for remainder of task.  Returned to bed level to dress. Set-up assist UB dress. Assist to don B socks 2/2 tight fitting socks. Increased time and rest breaks required throughout in attempts to thread on pants. Rolled with supervision and use of hospital bed functions in order for therapist to pull up pants. Returned to w/c and completed grooming tasks mod I at sink.   Pt returned to w/c at end of session, left seated in w/c with all needs in reach.   Therapy Documentation Precautions:  Precautions Precautions: Fall Restrictions Weight Bearing Restrictions: No   Therapy/Group: Individual Therapy  Yentl Verge L 01/24/2019, 6:57 AM

## 2019-01-24 NOTE — Progress Notes (Signed)
Physical Therapy Session Note  Patient Details  Name: Carlos Ferguson MRN: [id number] Date of Birth: August 02, 1965  Today's Date: 01/24/2019 PT Individual Time: 1415-1525 PT Individual Time Calculation (min): 70 min   Short Term Goals: Week 4:  PT Short Term Goal 1 (Week 4): =LTG due to ELOS  Skilled Therapeutic Interventions/Progress Updates:    Pt received seated in w/c in room, agreeable to PT session. No complaints of pain. Manual w/c propulsion 2 x 300 ft at Supervision level navigating hallways, elevators, uneven ground, and busy environment outdoors. Seated BUE strengthening therex with level orange and green theraband: bicep curls, tricep ext, horiz abd, ER x 10 reps each. Seated RLE LAQ x 10 reps. Squat pivot transfer w/c to mat table with CGA. Sit to stand x 3 reps to RW with min A. Attempt to have pt take step forward back, unable to unweight LE in standing and stands with heavy reliance on BUE on RW. Slide board transfer back to w/c with CGA. Pt left seated in w/c in room with needs in reach at end of session, dad present.  Therapy Documentation Precautions:  Precautions Precautions: Fall Restrictions Weight Bearing Restrictions: No    Therapy/Group: Individual Therapy   Excell Seltzer, PT, DPT  01/24/2019, 3:39 PM

## 2019-01-24 NOTE — Plan of Care (Signed)
  Problem: Consults Goal: RH STROKE PATIENT EDUCATION Description: See Patient Education module for education specifics  Outcome: Progressing Goal: Nutrition Consult-if indicated Outcome: Progressing   Problem: RH BOWEL ELIMINATION Goal: RH STG MANAGE BOWEL WITH ASSISTANCE Description: STG Manage Bowel with max Assistance. Outcome: Progressing Goal: RH STG MANAGE BOWEL W/MEDICATION W/ASSISTANCE Description: STG Manage Bowel with Medication with max Assistance. Outcome: Progressing   Problem: RH BLADDER ELIMINATION Goal: RH STG MANAGE BLADDER WITH ASSISTANCE Description: STG Manage Bladder With mod Assistance Outcome: Progressing Goal: RH STG MANAGE BLADDER WITH MEDICATION WITH ASSISTANCE Description: STG Manage Bladder With Medication With mod Assistance. Outcome: Progressing Goal: RH STG MANAGE BLADDER WITH EQUIPMENT WITH ASSISTANCE Description: STG Manage Bladder With Equipment With mod Assistance Outcome: Progressing   Problem: RH SKIN INTEGRITY Goal: RH STG SKIN FREE OF INFECTION/BREAKDOWN Description: Max assist Outcome: Progressing Goal: RH STG MAINTAIN SKIN INTEGRITY WITH ASSISTANCE Description: STG Maintain Skin Integrity With mas Assistance. Outcome: Progressing Goal: RH STG ABLE TO PERFORM INCISION/WOUND CARE W/ASSISTANCE Description: STG Able To Perform Incision/Wound Care With max Assistance. Outcome: Progressing   Problem: RH SAFETY Goal: RH STG ADHERE TO SAFETY PRECAUTIONS W/ASSISTANCE/DEVICE Description: STG Adhere to Safety Precautions With min Assistance/Device. Outcome: Progressing Goal: RH STG DECREASED RISK OF FALL WITH ASSISTANCE Description: STG Decreased Risk of Fall With min Assistance. Outcome: Progressing   Problem: RH COGNITION-NURSING Goal: RH STG USES MEMORY AIDS/STRATEGIES W/ASSIST TO PROBLEM SOLVE Description: STG Uses Memory Aids/Strategies With min Assistance to Problem Solve. Outcome: Progressing Goal: RH STG ANTICIPATES NEEDS/CALLS  FOR ASSIST W/ASSIST/CUES Description: STG Anticipates Needs/Calls for Assist With min Assistance/Cues. Outcome: Progressing   Problem: RH PAIN MANAGEMENT Goal: RH STG PAIN MANAGED AT OR BELOW PT'S PAIN GOAL Description: Less than 3 Outcome: Progressing   Problem: RH KNOWLEDGE DEFICIT Goal: RH STG INCREASE KNOWLEDGE OF HYPERTENSION Description: Patient able to describe management of hypertension with cues/handouts Outcome: Progressing Goal: RH STG INCREASE KNOWLEDGE OF STROKE PROPHYLAXIS Description: Patient able to describe prophylaxis to avoid stroke with cues/handout Outcome: Progressing

## 2019-01-25 ENCOUNTER — Inpatient Hospital Stay (HOSPITAL_COMMUNITY): Payer: Medicare Other | Admitting: Occupational Therapy

## 2019-01-25 ENCOUNTER — Inpatient Hospital Stay (HOSPITAL_COMMUNITY): Payer: Medicare Other | Admitting: Speech Pathology

## 2019-01-25 ENCOUNTER — Inpatient Hospital Stay (HOSPITAL_COMMUNITY): Payer: Medicare Other | Admitting: Physical Therapy

## 2019-01-25 MED ORDER — SORBITOL 70 % SOLN
60.0000 mL | Freq: Once | Status: AC
  Administered 2019-01-25: 60 mL via ORAL
  Filled 2019-01-25: qty 60

## 2019-01-25 NOTE — Progress Notes (Signed)
[place] PHYSICAL MEDICINE & REHABILITATION PROGRESS NOTE   Subjective/Complaints:   Pt reports no BM in 2-3 days- "no one came by to try bowel program last night- didn't ask for it either".  Never had bowel program since born.  Usually feels bad when constipated- of note, doesn't feel bad. Mg citrate made him very nauseated- of note labs not done.  ROS: Patient denies fever, rash, sore throat, blurred vision, vomiting, diarrhea, cough, shortness of breath or chest pain, joint or back pain, headache, or mood change.    Objective:   No results found. No results for input(s): WBC, HGB, HCT, PLT in the last 72 hours. No results for input(s): NA, K, CL, CO2, GLUCOSE, BUN, CREATININE, CALCIUM in the last 72 hours.  Intake/Output Summary (Last 24 hours) at 01/25/2019 1437 Last data filed at 01/25/2019 1315 Gross per 24 hour  Intake 1060 ml  Output 550 ml  Net 510 ml     Physical Exam: Vital Signs Blood pressure 118/69, pulse 75, temperature 97.6 F (36.4 C), temperature source Oral, resp. rate 16, height 4\' 9"  (1.448 m), weight 65 kg, SpO2 99 %. Constitutional: No distress . Vital signs reviewed. Sitting up in bed; eating breakfast, >1/2 done; NAD HEENT: EOMI but has intermittent nystagmus, oral membranes moist Neck: supple; no trigger points palpated Cardiovascular: RRR without murmur.   Respiratory: CTA Bilaterally without wheezes or rales. Normal effort    GI: BS +, non-tender, non-distended  Skin: Healed incisions. Psych:bright affect;  cooperative. Musc: LE contractures Neurological: Alert Dysarthria Able to follow commands Motor: Bilateral upper extremities: 4+/5 proximal distal, without change No movement noted in bilateral lower extremities, without change  Assessment/Plan: 1. Functional deficits secondary to cerebellar hematoma/severe hydrocephalus- due to hypertensive crisis - with a hx of Chiari malformation and VP shunt as well as spina bifida which require  3+ hours per day of interdisciplinary therapy in a comprehensive inpatient rehab setting.  Physiatrist is providing close team supervision and 24 hour management of active medical problems listed below.  Physiatrist and rehab team continue to assess barriers to discharge/monitor patient progress toward functional and medical goals  Care Tool:  Bathing    Body parts bathed by patient: Right arm, Left arm, Chest, Abdomen, Right upper leg, Left upper leg, Face, Front perineal area, Right lower leg, Left lower leg   Body parts bathed by helper: Buttocks Body parts n/a: Front perineal area, Buttocks(Pt declined this session)   Bathing assist Assist Level: Minimal Assistance - Patient > 75%     Upper Body Dressing/Undressing Upper body dressing   What is the patient wearing?: Pull over shirt    Upper body assist Assist Level: Set up assist    Lower Body Dressing/Undressing Lower body dressing      What is the patient wearing?: Pants, Incontinence brief     Lower body assist Assist for lower body dressing: Moderate Assistance - Patient 50 - 74%     Toileting Toileting    Toileting assist Assist for toileting: Total Assistance - Patient < 25%     Transfers Chair/bed transfer  Transfers assist  Chair/bed transfer activity did not occur: Safety/medical concerns  Chair/bed transfer assist level: Contact Guard/Touching assist     Locomotion Ambulation   Ambulation assist   Ambulation activity did not occur: Safety/medical concerns          Walk 10 feet activity   Assist  Walk 10 feet activity did not occur: Safety/medical concerns  Walk 50 feet activity   Assist Walk 50 feet with 2 turns activity did not occur: Safety/medical concerns         Walk 150 feet activity   Assist Walk 150 feet activity did not occur: Safety/medical concerns         Walk 10 feet on uneven surface  activity   Assist Walk 10 feet on uneven surfaces activity  did not occur: Safety/medical concerns         Wheelchair     Assist Will patient use wheelchair at discharge?: Yes Type of Wheelchair: Manual Wheelchair activity did not occur: Safety/medical concerns  Wheelchair assist level: Supervision/Verbal cueing Max wheelchair distance: 150    Wheelchair 50 feet with 2 turns activity    Assist    Wheelchair 50 feet with 2 turns activity did not occur: Safety/medical concerns   Assist Level: Supervision/Verbal cueing   Wheelchair 150 feet activity     Assist  Wheelchair 150 feet activity did not occur: Safety/medical concerns   Assist Level: Supervision/Verbal cueing   Blood pressure 118/69, pulse 75, temperature 97.6 F (36.4 C), temperature source Oral, resp. rate 16, height 4\' 9"  (1.448 m), weight 65 kg, SpO2 99 %.   Medical Problem List and Plan: 1.  Deficits with mobility, transfers, self-care secondary to left cerebellar hemorrhage with history of strokes  Cont CIR PT, OT, SLP 2.  Antithrombotics: -DVT/anticoagulation:  Mechanical: Foot pump Bilateral lower extremities             -antiplatelet therapy: N/A due to bleed 3. Headaches/Pain Management: Added Ultram prn.   Increased Depakote ER to 750 mg daily and added norco for when tylneol and tramadol aren't effective.   Added 2 lidoderm patches 8pm to 8am daily for neck tightness, later received per patient preference 4. Mood: LCSW to follow for evaluation and support.              -antipsychotic agents: N/A 5. Neuropsych: This patient is fully capable of making decisions on his own behalf. 6. Skin/Wound Care: Routine pressure relief measures.  7. Fluids/Electrolytes/Nutrition: Monitor I/Os. Tolerating current diet.  8. Headaches: Persistent but appears to be improving.  Depakote added.  See #3 9. Leucocytosis: Resolved.    WBC 6.6 on 9/16  Continue to monitor 10. Hypokalemia: Continue daily supplement and adjust as needed.    Potassium 5.0 on 9/16,     Potassium supplement decreased    9/30- rechecking in AM 11. HTN: Monitor BP tid--continue atenolol               Vitals:   01/25/19 0557 01/25/19 1300  BP: 123/65 118/69  Pulse: 74 75  Resp: 17 16  Temp: 98.2 F (36.8 C) 97.6 F (36.4 C)  SpO2: 97% 99%   Better control 9/27 12. Seizure disorder: Continue Keppra 1500 mg bid.  Monitor reoccurrence.  No recent seizures, unchanged 15. 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  Overall improved with program  9/22- BM this AM outside bowel program  9/25- Suppository ,Mg citrate and enema--> +results  9/26--continue senokot, reassured pt that nausea should improve today  9/29- pt willing to try bowel program again.  9/30- didn't ask- will order sorbitol and suppository tonight to clean him out. 16. Attention/fatigue trial Provigil since doesn't raise BP as much 100 mg daily- can likely send out on Ritalin 17. Vertigo/dizziness/nausea-continue to follow   Patient sensitive to medications and will  try to avoid lethargy  Benefit with essential oils, continue  9/21- on scopolamine patch- pt questionable if helping, but told PA it was.  9/22- will add meclizine prn for dizziness- let pt know could make sleepy.  9/23- hadn't taken as of yet- reminded him to ask for it.  9/24- no improvement with Meclizine-   9/28- didn't take over weekend- since not helpful- now having associated nausea- will give Compazine prn.  9/29- doesn't want to try Baclofen- will con't to offer 18. R hip dysplasia- was due to go to [facility] for surgery-on hold at present 19.  Post stroke dysphagia:   Advanced to regular texture, thins on 9/11 20.  Neurogenic bladder  Discussed with nursing, change bladder scans to every 4 hours  9/23- bladder urgency- asking for caths very frequently-encourage q4 hours   9/24- bladder accident yesterday-  Cath q4 21. Cold/congestion  9/23- added Sudafed  3 mg q6 hours prn.  9/24- improved Sx's  LOS: 28 days A FACE TO FACE EVALUATION WAS PERFORMED  Carlos Ferguson 01/25/2019, 2:37 PM

## 2019-01-25 NOTE — Progress Notes (Signed)
Occupational Therapy Session Note  Patient Details  Name: Carlos Ferguson MRN: [id number] Date of Birth: 06-22-1965  Today's Date: 01/25/2019 OT Individual Time: 1100-1200 OT Individual Time Calculation (min): 60 min    Short Term Goals: Week 4:  OT Short Term Goal 1 (Week 4): Cont to address LTG awaiting SNF placement  Skilled Therapeutic Interventions/Progress Updates:    Pt seen for OT session focusing on functional transfers and ADL re-training. Pt prone upon arrival, moaning in discomfort. Reports being constipated and nursing having given him meds to assist in having BM. With encouragement, pt willing to get to [facility] to attempt BM. Education provided regarding benefits of upright toileting.  He donned non-slid socks in long sitting with supervision. Transitioned to sitting EOB with supervision. Sliding board transfer to drop arm BSC with CGA and VCs to maintain anterior weight shift. Pt completed push up using armrests of BSC in order for therapist to manage brief total A. Pt with continent BM. Completed lateral lean onto bed with CGA in order for therapist to complete hygiene total A. Sliding board transfer back to bed in order to complete thorough hygiene and don new brief. Bed mobility mod I. Sliding board transfer back to w/c.  Pt left seated in w/c at end of session, all needs in reach. Pt voiced feeling much better following void.   Therapy Documentation Precautions:  Precautions Precautions: Fall Restrictions Weight Bearing Restrictions: No   Therapy/Group: Individual Therapy  Lashawndra Lampkins L 01/25/2019, 6:57 AM

## 2019-01-25 NOTE — Progress Notes (Signed)
Physical Therapy Session Note  Patient Details  Name: Carlos Ferguson MRN: [id number] Date of Birth: 14-Sep-1965  Today's Date: 01/25/2019 PT Individual Time: 1954-2481 PT Individual Time Calculation (min): 40 min   Short Term Goals: Week 5:  PT Short Term Goal 1 (Week 5): Pt will complete bed mobility at mod I level PT Short Term Goal 2 (Week 5): Pt will complete least restrictive transfers with Supervision PT Short Term Goal 3 (Week 5): Pt will be able to stand x 30 sec while performing functional task  Skilled Therapeutic Interventions/Progress Updates:   Pt received sitting in [facility] and agreeable to PT make up session. Pt performed WC mobility to patio at [facility] entrance x [phone]. Sit<>stand from [employer] min assist x 3 with BUE support on RW. Pt able to perform weight shift R and L to reposition BLE under COM and reduce dependence on UE following 2 out of 3 attempts. Standing tolerance 3 x 45sec with BUE support on RW. Additional WC mobility through gift shop and to return to room 7043f PT filled out memory book for Pt, and pt able to recall all info from treatment session.  [facility] and all needs met.      Therapy Documentation Precautions:  Precautions Precautions: Fall Restrictions Weight Bearing Restrictions: No Vital Signs: Therapy Vitals Temp: 97.6 F (36.4 C) Temp Source: Oral Pulse Rate: 75 Resp: 16 BP: 118/69 Patient Position (if appropriate): Sitting Oxygen Therapy SpO2: 99 % O2 Device: Room Air    Therapy/Group: Individual Therapy  AuLorie Phenix/30/2020, 3:50 PM

## 2019-01-25 NOTE — Progress Notes (Signed)
Speech Language Pathology Daily Session Note  Patient Details  Name: Carlos Ferguson MRN: [id number] Date of Birth: 02/09/1966  Today's Date: 01/25/2019 SLP Individual Time: 0825-0920 SLP Individual Time Calculation (min): 55 min  Short Term Goals: Week 4: SLP Short Term Goal 1 (Week 4): Pt will complete semi-complex problem solving tasks with Min A cues. SLP Short Term Goal 2 (Week 4): Given Min A cues, pt will demonstrate use of 1 compensatory memory strategy to recall basic daily information in 5 out of 10 opportunities. SLP Short Term Goal 3 (Week 4): Pt will utilize memory notebook to answer basic [place] questions regarding information in 5 out of 10 opportunities with Supervision A cues. SLP Short Term Goal 4 (Week 4): Pt will demonstrate selective attention to task for ~ 30 minutes with Supervision A cues for redirection.  Skilled Therapeutic Interventions: Pt was seen for skilled ST targeting cognition. Provided mainly Supervision A and 1 Min A question cue, pt recalled general events of all therapy sessions yesterday without need for use of external aid. SLP facilitated session with Mod A verbal cues for organization and logic during a complex deductive reasoning daily scheduling puzzle (Carlos Ferguson's schedule). Pt completed activity with 100% accuracy at this level of assist. Although pt reported difficulty focusing, he demonstrated selective attention despite multiple environmental distractions and intermittent dizziness (RN made aware of dizziness) for ~30 minutes with Supervision A verbal cues for redirection and encouragement. During a basic familiar card game (11 up) pt completed basic addition problems with 90% accuracy - 1 Supervision verbal cue provided for error awareness but pt corrected the error Mod I. Pt was left in bed with needs within reach and nursing present to assist with bowel movement. Continue per current plan of care.      Pain Pain Assessment Pain Scale: Faces Pain Score:  0-No pain Faces Pain Scale: No hurt(dizziness - RN made aware)  Therapy/Group: Individual Therapy  Carlos Ferguson 01/25/2019, 10:02 AM

## 2019-01-25 NOTE — Plan of Care (Signed)
  Problem: Consults Goal: RH STROKE PATIENT EDUCATION Description: See Patient Education module for education specifics  Outcome: Progressing Goal: Nutrition Consult-if indicated Outcome: Progressing   Problem: RH BOWEL ELIMINATION Goal: RH STG MANAGE BOWEL WITH ASSISTANCE Description: STG Manage Bowel with max Assistance. Outcome: Progressing Goal: RH STG MANAGE BOWEL W/MEDICATION W/ASSISTANCE Description: STG Manage Bowel with Medication with max Assistance. Outcome: Progressing   Problem: RH BLADDER ELIMINATION Goal: RH STG MANAGE BLADDER WITH ASSISTANCE Description: STG Manage Bladder With mod Assistance Outcome: Progressing Goal: RH STG MANAGE BLADDER WITH MEDICATION WITH ASSISTANCE Description: STG Manage Bladder With Medication With mod Assistance. Outcome: Progressing Goal: RH STG MANAGE BLADDER WITH EQUIPMENT WITH ASSISTANCE Description: STG Manage Bladder With Equipment With mod Assistance Outcome: Progressing   Problem: RH SKIN INTEGRITY Goal: RH STG SKIN FREE OF INFECTION/BREAKDOWN Description: Max assist Outcome: Progressing Goal: RH STG MAINTAIN SKIN INTEGRITY WITH ASSISTANCE Description: STG Maintain Skin Integrity With mas Assistance. Outcome: Progressing Goal: RH STG ABLE TO PERFORM INCISION/WOUND CARE W/ASSISTANCE Description: STG Able To Perform Incision/Wound Care With max Assistance. Outcome: Progressing   Problem: RH SAFETY Goal: RH STG ADHERE TO SAFETY PRECAUTIONS W/ASSISTANCE/DEVICE Description: STG Adhere to Safety Precautions With min Assistance/Device. Outcome: Progressing Goal: RH STG DECREASED RISK OF FALL WITH ASSISTANCE Description: STG Decreased Risk of Fall With min Assistance. Outcome: Progressing   Problem: RH COGNITION-NURSING Goal: RH STG USES MEMORY AIDS/STRATEGIES W/ASSIST TO PROBLEM SOLVE Description: STG Uses Memory Aids/Strategies With min Assistance to Problem Solve. Outcome: Progressing Goal: RH STG ANTICIPATES NEEDS/CALLS  FOR ASSIST W/ASSIST/CUES Description: STG Anticipates Needs/Calls for Assist With min Assistance/Cues. Outcome: Progressing   Problem: RH PAIN MANAGEMENT Goal: RH STG PAIN MANAGED AT OR BELOW PT'S PAIN GOAL Description: Less than 3 Outcome: Progressing   Problem: RH KNOWLEDGE DEFICIT Goal: RH STG INCREASE KNOWLEDGE OF HYPERTENSION Description: Patient able to describe management of hypertension with cues/handouts Outcome: Progressing Goal: RH STG INCREASE KNOWLEDGE OF STROKE PROPHYLAXIS Description: Patient able to describe prophylaxis to avoid stroke with cues/handout Outcome: Progressing   

## 2019-01-25 NOTE — Progress Notes (Signed)
Physical Therapy Weekly Progress Note  Patient Details  Name: Carlos Ferguson MRN: [id number] Date of Birth: [birth year]  Beginning of progress report period: January 18, 2019 End of progress report period: January 25, 2019  Today's Date: 01/25/2019 PT Individual Time: 1300-1415 PT Individual Time Calculation (min): 75 min   Patient did not have STG set due to ELOS. Pt is awaiting placement at SNF and therefore STG were set at LTG due to ELOS. New STG have been set for next reporting period. Pt is overall at Supervision level for bed mobility, CGA for slide board or squat pivot transfers to level surfaces, is at Supervision level for w/c mobility, and is min A to stand to RW. Pt is not able to take steps with RW due to significant LE weakness and heavy reliance on UE in standing.  Patient continues to demonstrate the following deficits muscle weakness, abnormal tone, unbalanced muscle activation and decreased coordination, decreased attention, decreased awareness, decreased problem solving, decreased safety awareness, decreased memory and delayed processing and decreased sitting balance, decreased standing balance, decreased postural control and decreased balance strategies and therefore will continue to benefit from skilled PT intervention to increase functional independence with mobility.  Patient progressing toward long term goals..  Continue plan of care.  PT Short Term Goals Week 4:  PT Short Term Goal 1 (Week 4): =LTG due to ELOS PT Short Term Goal 1 - Progress (Week 4): Progressing toward goal Week 5:  PT Short Term Goal 1 (Week 5): Pt will complete bed mobility at mod I level PT Short Term Goal 2 (Week 5): Pt will complete least restrictive transfers with Supervision PT Short Term Goal 3 (Week 5): Pt will be able to stand x 30 sec while performing functional task  Skilled Therapeutic Interventions/Progress Updates:  Pt received seated in w/c in room, agreeable to PT session. No  complaints of pain. Pt is max A to don socks, BLE braces, and shoes while seated in w/c. Pt is dependent to don pants, is able to perform w/c pushup for dependent donning of pants. Manual w/c propulsion x 150 ft with use of BUE at Supervision level. Car transfer with min A and use of slide board. Slide board transfer w/c to/from mat table with CGA. Sit to stand x 3 reps to RW with min A. Pt initially is unable to laterally weight shift to bring LLE under him but during last two bouts of standing is able to stand with BLE even underneath of him. Seated volleyball toss against rebounder, 3 x 15 reps for sitting balance and endurance. Pt left seated in w/c in room with needs in reach at end of session.  Therapy Documentation Precautions:  Precautions Precautions: Fall Restrictions Weight Bearing Restrictions: No   Therapy/Group: Individual Therapy   Excell Seltzer, PT, DPT 01/25/2019, 3:59 PM

## 2019-01-26 ENCOUNTER — Inpatient Hospital Stay (HOSPITAL_COMMUNITY): Payer: Medicare Other | Admitting: Occupational Therapy

## 2019-01-26 ENCOUNTER — Inpatient Hospital Stay (HOSPITAL_COMMUNITY): Payer: Medicare Other | Admitting: Physical Therapy

## 2019-01-26 ENCOUNTER — Inpatient Hospital Stay (HOSPITAL_COMMUNITY): Payer: Medicare Other | Admitting: Speech Pathology

## 2019-01-26 LAB — CBC WITH DIFFERENTIAL/PLATELET
Abs Immature Granulocytes: 0.01 10*3/uL (ref 0.00–0.07)
Basophils Absolute: 0 10*3/uL (ref 0.0–0.1)
Basophils Relative: 0 %
Eosinophils Absolute: 0.1 10*3/uL (ref 0.0–0.5)
Eosinophils Relative: 2 %
HCT: 42.2 % (ref 39.0–52.0)
Hemoglobin: 14.6 g/dL (ref 13.0–17.0)
Immature Granulocytes: 0 %
Lymphocytes Relative: 28 %
Lymphs Abs: 1.5 10*3/uL (ref 0.7–4.0)
MCH: 32.8 pg (ref 26.0–34.0)
MCHC: 34.6 g/dL (ref 30.0–36.0)
MCV: 94.8 fL (ref 80.0–100.0)
Monocytes Absolute: 0.6 10*3/uL (ref 0.1–1.0)
Monocytes Relative: 11 %
Neutro Abs: 3.1 10*3/uL (ref 1.7–7.7)
Neutrophils Relative %: 59 %
Platelets: 185 10*3/uL (ref 150–400)
RBC: 4.45 MIL/uL (ref 4.22–5.81)
RDW: 14.5 % (ref 11.5–15.5)
WBC: 5.3 10*3/uL (ref 4.0–10.5)
nRBC: 0 % (ref 0.0–0.2)

## 2019-01-26 LAB — COMPREHENSIVE METABOLIC PANEL
ALT: 35 U/L (ref 0–44)
AST: 16 U/L (ref 15–41)
Albumin: 3.5 g/dL (ref 3.5–5.0)
Alkaline Phosphatase: 61 U/L (ref 38–126)
Anion gap: 8 (ref 5–15)
BUN: 15 mg/dL (ref 6–20)
CO2: 28 mmol/L (ref 22–32)
Calcium: 9.3 mg/dL (ref 8.9–10.3)
Chloride: 103 mmol/L (ref 98–111)
Creatinine, Ser: 0.95 mg/dL (ref 0.61–1.24)
GFR calc Af Amer: >60 mL/min (ref >60)
GFR calc non Af Amer: >60 mL/min (ref >60)
Glucose, Bld: 98 mg/dL (ref 70–99)
Potassium: 5 mmol/L (ref 3.5–5.1)
Sodium: 139 mmol/L (ref 135–145)
Total Bilirubin: 0.4 mg/dL (ref 0.3–1.2)
Total Protein: 6.7 g/dL (ref 6.5–8.1)

## 2019-01-26 NOTE — Progress Notes (Signed)
Physical Therapy Session Note  Patient Details  Name: Carlos Ferguson MRN: [id number] Date of Birth: 11-18-1965  Today's Date: 01/26/2019 PT Individual Time: 0945-1100 PT Individual Time Calculation (min): 75 min   Short Term Goals: Week 5:  PT Short Term Goal 1 (Week 5): Pt will complete bed mobility at mod I level PT Short Term Goal 2 (Week 5): Pt will complete least restrictive transfers with Supervision PT Short Term Goal 3 (Week 5): Pt will be able to stand x 30 sec while performing functional task  Skilled Therapeutic Interventions/Progress Updates:    Pt received seated in bed, agreeable to PT session. No complaints of pain. Pt requesting to have I/O cath performed prior to start of therapy session. Per chair pt was cathed at 4:30 this AM and was bladder scanned just prior to start of session. Per RN this information is correct and pt will not require an I/O cath until after therapy session, pt agreeable. Pt is min A to don socks and BLE braces while long-sitting in bed. Pt is mod A to don pants via rolling L/R with use of bedrails. Pt requires max cueing for technique for donning pants. Long-sitting to sitting EOB Supervision. Slide board transfer bed to w/c with CGA. Manual w/c propulsion 2 x 150 ft with use of BUE and Supervision. Slide board transfer w/c to/from therapy mat with CGA. Pt has near fall due to LOB anteriorly when transferring back to w/c, requires max A to recover and maintain sitting balance and safely complete transfer. Sit to stand x 3 reps to RW with min A. Pt is able to take one step forward/back while standing with RW with mod A. Ambulation x 5 ft with RW with mod to max A for balance and close w/c follow for safety. Pt demos flexed trunk posture with gait holding onto front of RW (reports he ambulated with flexed trunk at baseline) and demos fair ability to advance RLE during gait 2/2 hip dysplasia as well as RLE weakness. UBE x 4 min forward/backward for global endurance  training. Pt left seated in w/c in room with needs in reach at end of session.  Therapy Documentation Precautions:  Precautions Precautions: Fall Restrictions Weight Bearing Restrictions: No    Therapy/Group: Individual Therapy   Excell Seltzer, PT, DPT  01/26/2019, 3:31 PM

## 2019-01-26 NOTE — Progress Notes (Signed)
PHYSICAL MEDICINE & REHABILITATION PROGRESS NOTE   Subjective/Complaints:   Pt reports did have many BMs yesterday- filled up bedpan per pt. Admits his short term memory is poor- doesn't remember SLP or Doctor's name- went through strategies to try and remember.  Was told by a nurse shouldn't drink yoohoo and gave him ICS for breathing- pt wasn't sure why.  ROS: Patient denies fever, rash, sore throat, blurred vision, vomiting, diarrhea, cough, shortness of breath or chest pain, joint or back pain, headache, or mood change.    Objective:   No results found. Recent Labs    01/26/19 0501  WBC 5.3  HGB 14.6  HCT 42.2  PLT 185   Recent Labs    01/26/19 0501  NA 139  K 5.0  CL 103  CO2 28  GLUCOSE 98  BUN 15  CREATININE 0.95  CALCIUM 9.3    Intake/Output Summary (Last 24 hours) at 01/26/2019 0936 Last data filed at 01/26/2019 0909 Gross per 24 hour  Intake 438 ml  Output 350 ml  Net 88 ml     Physical Exam: Vital Signs Blood pressure 121/75, pulse 65, temperature 98.2 F (36.8 C), resp. rate 18, height 4\' 9"  (1.448 m), weight 65 kg, SpO2 97 %. Constitutional: No distress . Vital signs and labs reviewed. Sitting up in bed; SLP working on [employer] strategies with pt; couldn't remember our names, NAD HEENT: EOMI but has intermittent nystagmus, oral membranes moist Neck: supple; no trigger points palpated Cardiovascular: RRR without murmur.   Respiratory: CTA Bilaterally without wheezes or rales. Normal effort    GI: BS +, non-tender, non-distended  Skin: Healed incisions. Psych:bright affect;  cooperative. Musc: LE contractures Neurological: Alert Dysarthria Able to follow commands Motor: Bilateral upper extremities: 4+/5 proximal distal, without change No movement noted in bilateral lower extremities, without change  Assessment/Plan: 1. Functional deficits secondary to cerebellar hematoma/severe hydrocephalus- due to hypertensive crisis - with a  hx of Chiari malformation and VP shunt as well as spina bifida which require 3+ hours per day of interdisciplinary therapy in a comprehensive inpatient rehab setting.  Physiatrist is providing close team supervision and 24 hour management of active medical problems listed below.  Physiatrist and rehab team continue to assess barriers to discharge/monitor patient progress toward functional and medical goals  Care Tool:  Bathing    Body parts bathed by patient: Right arm, Left arm, Chest, Abdomen, Right upper leg, Left upper leg, Face, Front perineal area, Right lower leg, Left lower leg   Body parts bathed by helper: Buttocks Body parts n/a: Front perineal area, Buttocks(Pt declined this session)   Bathing assist Assist Level: Minimal Assistance - Patient > 75%     Upper Body Dressing/Undressing Upper body dressing   What is the patient wearing?: Pull over shirt    Upper body assist Assist Level: Set up assist    Lower Body Dressing/Undressing Lower body dressing      What is the patient wearing?: Pants, Incontinence brief     Lower body assist Assist for lower body dressing: Moderate Assistance - Patient 50 - 74%     Toileting Toileting    Toileting assist Assist for toileting: Total Assistance - Patient < 25%     Transfers Chair/bed transfer  Transfers assist  Chair/bed transfer activity did not occur: Safety/medical concerns  Chair/bed transfer assist level: Contact Guard/Touching assist     Locomotion Ambulation   Ambulation assist   Ambulation activity did not occur: Safety/medical concerns  Walk 10 feet activity   Assist  Walk 10 feet activity did not occur: Safety/medical concerns        Walk 50 feet activity   Assist Walk 50 feet with 2 turns activity did not occur: Safety/medical concerns         Walk 150 feet activity   Assist Walk 150 feet activity did not occur: Safety/medical concerns         Walk 10 feet on  uneven surface  activity   Assist Walk 10 feet on uneven surfaces activity did not occur: Safety/medical concerns         Wheelchair     Assist Will patient use wheelchair at discharge?: Yes Type of Wheelchair: Manual Wheelchair activity did not occur: Safety/medical concerns  Wheelchair assist level: Supervision/Verbal cueing Max wheelchair distance: 150    Wheelchair 50 feet with 2 turns activity    Assist    Wheelchair 50 feet with 2 turns activity did not occur: Safety/medical concerns   Assist Level: Supervision/Verbal cueing   Wheelchair 150 feet activity     Assist  Wheelchair 150 feet activity did not occur: Safety/medical concerns   Assist Level: Supervision/Verbal cueing   Blood pressure 121/75, pulse 65, temperature 98.2 F (36.8 C), resp. rate 18, height 4\' 9"  (1.448 m), weight 65 kg, SpO2 97 %.   Medical Problem List and Plan: 1.  Deficits with mobility, transfers, self-care secondary to left cerebellar hemorrhage with history of strokes  Cont CIR PT, OT, SLP 2.  Antithrombotics: -DVT/anticoagulation:  Mechanical: Foot pump Bilateral lower extremities             -antiplatelet therapy: N/A due to bleed 3. Headaches/Pain Management: Added Ultram prn.   Increased Depakote ER to 750 mg daily and added norco for when tylneol and tramadol aren't effective.   Added 2 lidoderm patches 8pm to 8am daily for neck tightness, later received per patient preference 4. Mood: LCSW to follow for evaluation and support.              -antipsychotic agents: N/A 5. Neuropsych: This patient is fully capable of making decisions on his own behalf. 6. Skin/Wound Care: Routine pressure relief measures.  7. Fluids/Electrolytes/Nutrition: Monitor I/Os. Tolerating current diet.  8. Headaches: Persistent but appears to be improving.  Depakote added.  See #3 9. Leucocytosis: Resolved.    WBC 6.6 on 9/16  Continue to monitor 10. Hypokalemia: Continue daily supplement  and adjust as needed.    Potassium 5.0 on 9/16,    Potassium supplement decreased    9/30- rechecking in AM  10/1- K+ 5.0- off Potassium supplement- doesn't appear to be on med that causes high K+ will recheck on Monday and then f/u. 11. HTN: Monitor BP tid--continue atenolol               Vitals:   01/25/19 1931 01/26/19 0433  BP: 131/84 121/75  Pulse: 69 65  Resp: 18 18  Temp: 97.8 F (36.6 C) 98.2 F (36.8 C)  SpO2: 99% 97%   Better control 9/27 12. Seizure disorder: Continue Keppra 1500 mg bid.  Monitor reoccurrence.  No recent seizures, unchanged 15. 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  Overall improved with program  9/22- BM this AM outside bowel program  9/25- Suppository ,Mg citrate and enema--> +results  9/26--continue senokot, reassured pt that nausea should improve today  9/29- pt willing to try bowel program again.  9/30- didn't ask- will order sorbitol and suppository tonight to clean him out.  10/1- cleaned out- "filled bedpan" per pt. 16. Attention/fatigue trial Provigil since doesn't raise BP as much 100 mg daily- can likely send out on Ritalin 17. Vertigo/dizziness/nausea-continue to follow   Patient sensitive to medications and will try to avoid lethargy  Benefit with essential oils, continue  9/21- on scopolamine patch- pt questionable if helping, but told PA it was.  9/22- will add meclizine prn for dizziness- let pt know could make sleepy.  9/23- hadn't taken as of yet- reminded him to ask for it.  9/24- no improvement with Meclizine-   9/28- didn't take over weekend- since not helpful- now having associated nausea- will give Compazine prn.  9/29- doesn't want to try Baclofen- will con't to offer  10/1- will d/c Meclizine since not helpful  18. R hip dysplasia- was due to go to [facility] for surgery-on hold at present 19.  Post stroke dysphagia:   Advanced to regular texture,  thins on 9/11 20.  Neurogenic bladder  Discussed with nursing, change bladder scans to every 4 hours  9/23- bladder urgency- asking for caths very frequently-encourage q4 hours   9/24- bladder accident yesterday-  Cath q4 21. Cold/congestion  9/23- added Sudafed 3 mg q6 hours prn.  9/24- improved Sx's  LOS: 29 days A FACE TO FACE EVALUATION WAS PERFORMED  Eneida Evers 01/26/2019, 9:36 AM

## 2019-01-26 NOTE — Progress Notes (Signed)
Speech Language Pathology Daily Session Note  Patient Details  Name: Carlos Ferguson MRN: [id number] Date of Birth: November 13, 1965  Today's Date: 01/26/2019 SLP Individual Time: 0730-0829 SLP Individual Time Calculation (min): 59 min  Short Term Goals: Week 4: SLP Short Term Goal 1 (Week 4): Pt will complete semi-complex problem solving tasks with Min A cues. SLP Short Term Goal 2 (Week 4): Given Min A cues, pt will demonstrate use of 1 compensatory memory strategy to recall basic daily information in 5 out of 10 opportunities. SLP Short Term Goal 3 (Week 4): Pt will utilize memory notebook to answer basic Ramirez-Perez questions regarding information in 5 out of 10 opportunities with Supervision A cues. SLP Short Term Goal 4 (Week 4): Pt will demonstrate selective attention to task for ~ 30 minutes with Supervision A cues for redirection.  Skilled Therapeutic Interventions: Pt was seen for skilled ST targeting cognition. SLP facilitated session with re-administration of MOCA (version 7.1) to assess pt's progress throughout rehab stay. Pt scored 18/30, which is 6 points higher than his initial evaluation completed 12/30/18. Deficits highlighted during structured assessment are short term memory, semi-complex problem solving/executive function, and selective attention. Storage and working memory impairments noted during tasks today. Pt able to verbally recall 1 compensatory memory strategy with Supervision A question prompts, and required Min A to recall 1 additional strategy. SLP provided review of WRAP compensatory memory strategies (write it, repeat/routine/associate/picture it), which pt expressed being familiar with once written handout provided. Recommend continue skilled ST services while inpatient to address above listed deficits. Pt left in bed with alarm set and all needs within reach. Continue per current plan of care.      Pain Pain Assessment Pain Score: 0-No pain  Therapy/Group: Individual  Therapy  Arbutus Leas 01/26/2019, 9:42 AM

## 2019-01-26 NOTE — Progress Notes (Signed)
Occupational Therapy Session Note  Patient Details  Name: Fields Oros MRN: [id number] Date of Birth: [birth year]  Today's Date: 01/26/2019 OT Individual Time: 1300-1400 OT Individual Time Calculation (min): 60 min    Short Term Goals: Week 4:  OT Short Term Goal 1 (Week 4): Cont to address LTG awaiting SNF placement  Skilled Therapeutic Interventions/Progress Updates:    Pt seen for OT ADL bathing/dressing session. Pt sititng up in w/c upon arrival, denied pain and agreeable to tx session and agreeable to shower. Completed sliding board transfers w/c<> tub transfer bench with CGA and VCs for technique. Bathed seated on tub bench, VCs for use of lateral leans to complete buttock hygiene.  He returned  To w/c to dress. Set-up UB dressing. Total A LB dressing from w/c level. Completed w/c push up in order for therapist to don brief. Stood at sink with mod A in order for pants to be pulled up total A. Total A for donning socks/AFOs/shoes. Pt self propelled w/c throughout unit for UE strengthening and general activity tolerance with supervision. During task, discussed next venue of care, importance of self-advocacy, modified ADLs/IADLs, and d/c planning.   Therapy Documentation Precautions:  Precautions Precautions: Fall Restrictions Weight Bearing Restrictions: No   Therapy/Group: Individual Therapy  Koreen Lizaola L 01/26/2019, 7:00 AM

## 2019-01-26 NOTE — Patient Care Conference (Signed)
Inpatient RehabilitationTeam Conference and Plan of Care Update Date: 01/25/2019   Time: 9:35 AM    Patient Name: Carlos Ferguson      Medical Record Number: [id number]  Date of Birth: May 23, 1965 Sex: Male         Room/Bed: 4M10C/4M10C-01 Payor Info: Payor: MEDICARE / Plan: MEDICARE PART A AND B / Product Type: *No Product type* /    Admit Date/Time:  12/28/2018  1:27 PM  Primary Diagnosis:  Intracranial hemorrhage, cerebellar [facility])  Patient Active Problem List   Diagnosis Date Noted  . Vertigo   . Frequent headaches 01/11/2019  . Dysphagia, post-stroke   . 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 Neurogenic bowel   . Neurogenic bladder, flaccid   . Essential hypertension 12/28/2018  . Obesity 12/28/2018  . Spina bifida ([place]) 12/28/2018  . Urinary retention 12/28/2018  . Seizures ([place]) 12/28/2018  . Hypokalemia 12/28/2018  . Intracranial hemorrhage, cerebellar ([place]) 12/28/2018  . Cerebellar bleed ([place])   . Leukocytosis   . 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 ICH (intracerebral hemorrhage) ([facility]) L cerebellar, etiology ? HTN 12/25/2018    Expected Discharge Date: Expected Discharge Date: (SNF)  Team Members Present: Physician leading conference: Dr. Alysia Penna Social Worker Present: Lennart Pall, LCSW Nurse Present: Rayetta Pigg, RN PT Present: Excell Seltzer, PT OT Present: Amy Rounds, OT SLP Present: Other (comment)(Erin Tamala Julian, CF-SLP) PPS Coordinator present : Gunnar Fusi, SLP     Current Status/Progress Goal Weekly Team Focus  Bowel/Bladder   Pt incontinent of bowel, PT does self cath Q 3-4 H LBM 09/27  timed toileting maintain regular bowel pattern  assist with toileting prn laxatives prn   Swallow/Nutrition/ Hydration             ADL's   Supervision siding board transfers, dressing from bed level min A LB dressing, set-up UB dressing, mod I grooming from w/c level. Self-cathing from bed level with  supervision  Downgraded LB dressing to min A, Bathing with supervision bed level, Set-up UB bathing/dressing  ADL re-training, functional mobility and transfers, activity and mobility tolerance/ positional changes 2/2 vestibular deficits   Mobility   S bed mobility, S to CGA slide board transfer, S w/c mobility, min A to stand to RW  Supervision overall  standing as able, LE strengthening, balance, transfers   Communication             Safety/Cognition/ Behavioral Observations  Min-Supervision A basic, Min-Mod A semi-complex  Supervision A (basic)  semi-complex problem solving, compensatory memory strategies, selective attention   Pain   Denies any c/o pain  Pt remains free of pain  Assess pain qshift and prn   Skin   MASD to groin and buttocks, nyastatin powder and desitin, ecchymosis to lower back incision line  Improvement of MASD  skin remain intact with no s/sx of infection       Rehab Goals Patient on target to meet rehab goals: Yes *See Care Plan and progress notes for long and short-term goals.     Barriers to Discharge  Current Status/Progress Possible Resolutions Date Resolved   Nursing                  PT  Decreased caregiver support;Home environment access/layout                 OT                  SLP  SW                Discharge Planning/Teaching Needs:  Plan has changed to SNF as father cannot provide needed level of care.  NA   Team Discussion:  Pt remains focused on b/b;  Has refused bowel program at times.  MASD on bottom but improved.  Supervision to min assist overall at w/c level.  B/d at bed level.  Standing with rw but no gait.  Plan still for SNF - bed search underway.  Revisions to Treatment Plan:  NA    Medical Summary Current Status: still dizzy- neurogenic bowel and bladder- constipated; cathing Weekly Focus/Goal: consitpation  Barriers to Discharge: Decreased family/caregiver support;Home enviroment  access/layout;Incontinence;Medical stability;Neurogenic Bowel & Bladder;Weight  Barriers to Discharge Comments: n/a Possible Resolutions to Barriers: dizizness- no treatment can find   Continued Need for Acute Rehabilitation Level of Care: The patient requires daily medical management by a physician with specialized training in physical medicine and rehabilitation for the following reasons: Direction of a multidisciplinary physical rehabilitation program to maximize functional independence : Yes Medical management of patient stability for increased activity during participation in an intensive rehabilitation regime.: Yes Analysis of laboratory values and/or radiology reports with any subsequent need for medication adjustment and/or medical intervention. : Yes   I attest that I was present, lead the team conference, and concur with the assessment and plan of the team.   Cyndie Woodbeck 01/26/2019, 9:07 AM   Team conference was held via web/ teleconference due to COVID - 19

## 2019-01-26 NOTE — Plan of Care (Signed)
  Problem: Consults Goal: RH STROKE PATIENT EDUCATION Description: See Patient Education module for education specifics  Outcome: Progressing Goal: Nutrition Consult-if indicated Outcome: Progressing   Problem: RH BOWEL ELIMINATION Goal: RH STG MANAGE BOWEL WITH ASSISTANCE Description: STG Manage Bowel with max Assistance. Outcome: Progressing Flowsheets (Taken 01/26/2019 1342) STG: Pt will manage bowels with assistance: 3-Moderate assistance Goal: RH STG MANAGE BOWEL W/MEDICATION W/ASSISTANCE Description: STG Manage Bowel with Medication with max Assistance. Outcome: Progressing Flowsheets (Taken 01/26/2019 1342) STG: Pt will manage bowels with medication with assistance: 3-Moderate assistance   Problem: RH BLADDER ELIMINATION Goal: RH STG MANAGE BLADDER WITH ASSISTANCE Description: STG Manage Bladder With mod Assistance Outcome: Progressing Flowsheets (Taken 01/26/2019 1342) STG: Pt will manage bladder with assistance: 5-Supervision/cueing Goal: RH STG MANAGE BLADDER WITH MEDICATION WITH ASSISTANCE Description: STG Manage Bladder With Medication With mod Assistance. Outcome: Progressing Flowsheets (Taken 01/26/2019 1342) STG: Pt will manage bladder with medication with assistance: 5-Supervision/cueing Goal: RH STG MANAGE BLADDER WITH EQUIPMENT WITH ASSISTANCE Description: STG Manage Bladder With Equipment With mod Assistance Outcome: Progressing Flowsheets (Taken 01/26/2019 1342) STG: Pt will manage bladder with equipment with assistance: 4-Minimal assistance   Problem: RH SKIN INTEGRITY Goal: RH STG SKIN FREE OF INFECTION/BREAKDOWN Description: Max assist Outcome: Progressing Goal: RH STG MAINTAIN SKIN INTEGRITY WITH ASSISTANCE Description: STG Maintain Skin Integrity With mas Assistance. Outcome: Progressing Flowsheets (Taken 01/26/2019 1342) STG: Maintain skin integrity with assistance: 4-Minimal assistance Goal: RH STG ABLE TO PERFORM INCISION/WOUND CARE  W/ASSISTANCE Description: STG Able To Perform Incision/Wound Care With max Assistance. Outcome: Progressing Flowsheets (Taken 01/26/2019 1342) STG: Pt will be able to perform incision/wound care with assistance: 4-Minimal assistance   Problem: RH SAFETY Goal: RH STG ADHERE TO SAFETY PRECAUTIONS W/ASSISTANCE/DEVICE Description: STG Adhere to Safety Precautions With min Assistance/Device. Outcome: Progressing Flowsheets (Taken 01/26/2019 1342) STG:Pt will adhere to safety precautions with assistance/device: 4-Minimal assistance Goal: RH STG DECREASED RISK OF FALL WITH ASSISTANCE Description: STG Decreased Risk of Fall With min Assistance. Outcome: Progressing Flowsheets (Taken 01/26/2019 1342) CLE:[id number] risk of fall  with assistance/device: 4-Minimal assistance   Problem: RH COGNITION-NURSING Goal: RH STG USES MEMORY AIDS/STRATEGIES W/ASSIST TO PROBLEM SOLVE Description: STG Uses Memory Aids/Strategies With min Assistance to Problem Solve. Outcome: Progressing Flowsheets (Taken 01/26/2019 1342) STG: Uses memory aids/strategies with assistance: 4-Minimal assistance Goal: RH STG ANTICIPATES NEEDS/CALLS FOR ASSIST W/ASSIST/CUES Description: STG Anticipates Needs/Calls for Assist With min Assistance/Cues. Outcome: Progressing   Problem: RH PAIN MANAGEMENT Goal: RH STG PAIN MANAGED AT OR BELOW PT'S PAIN GOAL Description: Less than 3 Outcome: Progressing   Problem: RH KNOWLEDGE DEFICIT Goal: RH STG INCREASE KNOWLEDGE OF HYPERTENSION Description: Patient able to describe management of hypertension with cues/handouts Outcome: Progressing Goal: RH STG INCREASE KNOWLEDGE OF STROKE PROPHYLAXIS Description: Patient able to describe prophylaxis to avoid stroke with cues/handout Outcome: Progressing

## 2019-01-27 ENCOUNTER — Inpatient Hospital Stay (HOSPITAL_COMMUNITY): Payer: Medicare Other | Admitting: Speech Pathology

## 2019-01-27 ENCOUNTER — Inpatient Hospital Stay (HOSPITAL_COMMUNITY): Payer: Medicare Other | Admitting: Physical Therapy

## 2019-01-27 ENCOUNTER — Inpatient Hospital Stay (HOSPITAL_COMMUNITY): Payer: Medicare Other | Admitting: Occupational Therapy

## 2019-01-27 NOTE — Progress Notes (Signed)
Occupational Therapy Session Note  Patient Details  Name: Carlos Ferguson MRN: [id number] Date of Birth: [birth year]  Today's Date: 01/27/2019 OT Individual Time: 1300-1400 OT Individual Time Calculation (min): 60 min    Short Term Goals: Week 4:  OT Short Term Goal 1 (Week 4): Cont to address LTG awaiting SNF placement  Skilled Therapeutic Interventions/Progress Updates:    Pt seen for OT session focusing on sit>stand, standing balance/endurance and ADL re-training.  Pt sitting up in w/c upon arrival finishing lunch and agreeable to tx session, denying pain. He self propelled w/c throughout unit with supervision. Sliding board transfer to therapy mat with close supervision and assist for set-up.  Completed sit>stand from [facility] with therapist seated directly in front of him. He would hold onto armrests of therapist's chair and then transition to holding onto therapist's shoulders to facilitate upright and erect standing posture. Pt min A to stand (essentially sliding off EOM due to pt's height). Pt with heavy reliance on UEs in standing, min-mod A overall for standing balance. Addressed weightshifts to R/L and pt attempting to re-position feet for increased BOS, however, with difficulty 2/2 no proprioceptive abilities in LEs. Mirror placed for visual feedback of standing posture and foot placement. Completed x7 in total, pt tolerating standing ~15-30 seconds each trial before requiring seated rest break. Progressed to standing at [facility] with min a overall! Pt transitioned back to w/c and returned to room. Completed UB bathing/dressing and grooming tasks from w/c level at sink with set-up. Pt left seated in w/c at end of session, all needs in reach.   Therapy Documentation Precautions:  Precautions Precautions: Fall Restrictions Weight Bearing Restrictions: No Pain: Pain Assessment Pain Score: 0-No pain   Therapy/Group: Individual Therapy  Jagger Demonte L 01/27/2019, 1:07 PM

## 2019-01-27 NOTE — Progress Notes (Signed)
Physical Therapy Session Note  Patient Details  Name: Carlos Ferguson MRN: [id number] Date of Birth: 1965/05/13  Today's Date: 01/27/2019 PT Individual Time: 0800-0915 PT Individual Time Calculation (min): 75 min   Short Term Goals: Week 5:  PT Short Term Goal 1 (Week 5): Pt will complete bed mobility at mod I level PT Short Term Goal 2 (Week 5): Pt will complete least restrictive transfers with Supervision PT Short Term Goal 3 (Week 5): Pt will be able to stand x 30 sec while performing functional task  Skilled Therapeutic Interventions/Progress Updates:    Pt received long-sitting in bed, agreeable to PT session. No complaints of pain but does report increase in dizziness this AM. Pt with reports of ongoing dizziness throughout session this AM, seated BP 128/80, HR 81. Pt is max A to don LE braces, shoes, and pants at bed level with min A for rolling L/R to pull pants up over hips. Supine to sit with Supervision. Slide board transfer bed to w/c with CGA. Manual w/c propulsion x 200 ft with use of BUE and Supervision. Provided pt with w/c gloves for improved grip with w/c mobility. Ascend/descend ramp in w/c with min A to ascend, Supervision to descend. Per pt he used to have a ramp at home but no longer does. Education with patient that he will likely go home at a w/c level when he d/c home from SNF and would need to have a ramp in place in that case. Offered to provide handout for ramp building specifications, pt declines and reports his family knows how to build a ramp and can have that in place for him when he leaves SNF. Seated w/c pushups 2 x 5 reps for BUE strengthening and pressure relief. Sit to stand to RW x 4 reps with mod A, pt demos improved ability to standing fully upright with BUE on RW grips vs on front of RW. Pt also exhibits improved ability to bring BLE under his trunk with proper hand placement on RW. Pt is able to take a step forward/back with LLE, unable to unweight RLE to  advance it. Pt continues to fatigue quickly with standing. Pt left seated in w/c in room with needs in reach at end of session.  Therapy Documentation Precautions:  Precautions Precautions: Fall Restrictions Weight Bearing Restrictions: No    Therapy/Group: Individual Therapy   Excell Seltzer, PT, DPT  01/27/2019, 3:21 PM

## 2019-01-27 NOTE — Progress Notes (Signed)
Speech Language Pathology Weekly Progress and Session Note  Patient Details  Name: Carlos Ferguson MRN: [id number] Date of Birth: 05/24/1965  Beginning of progress report period: January 20, 2019 End of progress report period: January 27, 2019  Today's Date: 01/27/2019 SLP Individual Time: 1000-1059 SLP Individual Time Calculation (min): 59 min  Short Term Goals: Week 4: SLP Short Term Goal 1 (Week 4): Pt will complete semi-complex problem solving tasks with Min A cues. SLP Short Term Goal 1 - Progress (Week 4): Progressing toward goal SLP Short Term Goal 2 (Week 4): Given Min A cues, pt will demonstrate use of 1 compensatory memory strategy to recall basic daily information in 5 out of 10 opportunities. SLP Short Term Goal 2 - Progress (Week 4): Met SLP Short Term Goal 3 (Week 4): Pt will utilize memory notebook to answer basic Jette questions regarding information in 5 out of 10 opportunities with Supervision A cues. SLP Short Term Goal 3 - Progress (Week 4): Met SLP Short Term Goal 4 (Week 4): Pt will demonstrate selective attention to task for ~ 30 minutes with Supervision A cues for redirection. SLP Short Term Goal 4 - Progress (Week 4): Met    New Short Term Goals: Week 5: SLP Short Term Goal 1 (Week 5): Pt will complete semi-complex problem solving tasks with Min A cues. SLP Short Term Goal 2 (Week 5): Given Supervision A cues, pt will demonstrate use of 1 compensatory memory strategy to recall basic daily information. SLP Short Term Goal 3 (Week 5): Pt will demonstrate selective attention to task for ~ 40 minutes with Supervision A cues for redirection.  Weekly Progress Updates:  Pt has made functional gains and met 3 out of 4 short term goals. He is currently awaiting SNF placement and has exceeded some of his original long term goal levels - ST now targeting semi-complex cognition as opposed to basic. Pt is currently supervision assist for basic cognition, Mod A for semi-complex  cognition due to cognitive impairments impacting short term memory, problem solving, and selective attention. Goals for swallowing and expression were previously met and are therefore no longer being targeted. Pt has demonstrated improved selective attention and use of compensatory memory strategies. Pt education is ongoing. Pt would continue to benefit from skilled ST while inpatient in order to maximize functional independence and reduce burden of care prior to discharge. Anticipate that pt will need 24/7 supervision at discharge in addition to [place] follow up at next level of care - SNF.     Intensity: Minumum of 1-2 x/day, 30 to 90 minutes Frequency: 3 to 5 out of 7 days Duration/Length of Stay: pending SNF placement Treatment/Interventions: Cognitive remediation/compensation;Internal/external aids;Cueing hierarchy;Environmental controls;Patient/family education;Functional tasks;Therapeutic Activities   Daily Session  Skilled Therapeutic Interventions:  Pt was seen for skilled ST targeting cognition. SLP facilitated session with Min A cues for use of memory notebook to recall compensatory memory strategies reviewed in earlier session (WRAP). Pt also answered [place] questions about his morning with use of memory notebook and Min A to located sign in room to recall therapists' names. SLP further facilitated session with fluctuating Min A for organization and error awareness, Mod A for attention to detail during a complex calendar monthly scheduling task. Pt became somewhat fixated on need to cath, however was redirectable with Min A verbal cues (as it was not time for him to cath yet, according to NT). Pt was left sitting in wheelchair with all needs within reach. Continue per current plan of  care.       Pain Pain Assessment Pain Score: 0-No pain  Therapy/Group: Individual Therapy  Arbutus Leas 01/27/2019, 12:19 PM

## 2019-01-27 NOTE — Progress Notes (Signed)
[place] PHYSICAL MEDICINE & REHABILITATION PROGRESS NOTE   Subjective/Complaints:   Pt reports Scopolamine patch was found this AM in his hair- was put back on neck- however, nursing said would bring another.  Denies headache. Admits hasn't been drinking well- will try to drink more- also, yoohoo was only once.   ROS: Patient denies fever, rash, sore throat, blurred vision, vomiting, diarrhea, cough, shortness of breath or chest pain, joint or back pain, headache, or mood change.    Objective:   No results found. Recent Labs    01/26/19 0501  WBC 5.3  HGB 14.6  HCT 42.2  PLT 185   Recent Labs    01/26/19 0501  NA 139  K 5.0  CL 103  CO2 28  GLUCOSE 98  BUN 15  CREATININE 0.95  CALCIUM 9.3    Intake/Output Summary (Last 24 hours) at 01/27/2019 0920 Last data filed at 01/27/2019 0900 Gross per 24 hour  Intake 722 ml  Output 400 ml  Net 322 ml     Physical Exam: Vital Signs Blood pressure 110/64, pulse 73, temperature 98 F (36.7 C), temperature source Oral, resp. rate 18, height 4\' 9"  (1.448 m), weight 65 kg, SpO2 97 %. Constitutional: No distress . Vital signs and labs reviewed. Sitting up in bed; PT in room; putting on AFOs; still cannot remember examiner's name, NAD HEENT: EOMI but has intermittent nystagmus, oral membranes moist Neck: supple; no trigger points palpated Cardiovascular: RRR without murmur.   Respiratory: CTA Bilaterally without wheezes or rales. Normal effort    GI: BS +, non-tender, non-distended  Skin: Healed incisions. Psych:bright affect;  cooperative. Musc: LE contractures Neurological: Alert Dysarthria Able to follow commands Motor: Bilateral upper extremities: 4+/5 proximal distal, without change No movement noted in bilateral lower extremities, without change  Assessment/Plan: 1. Functional deficits secondary to cerebellar hematoma/severe hydrocephalus- due to hypertensive crisis - with a hx of Chiari malformation and VP  shunt as well as spina bifida which require 3+ hours per day of interdisciplinary therapy in a comprehensive inpatient rehab setting.  Physiatrist is providing close team supervision and 24 hour management of active medical problems listed below.  Physiatrist and rehab team continue to assess barriers to discharge/monitor patient progress toward functional and medical goals  Care Tool:  Bathing    Body parts bathed by patient: Right arm, Left arm, Chest, Abdomen, Right upper leg, Left upper leg, Face, Front perineal area, Right lower leg, Left lower leg, Buttocks   Body parts bathed by helper: Buttocks Body parts n/a: Front perineal area, Buttocks(Pt declined this session)   Bathing assist Assist Level: Supervision/Verbal cueing     Upper Body Dressing/Undressing Upper body dressing   What is the patient wearing?: Pull over shirt    Upper body assist Assist Level: Set up assist    Lower Body Dressing/Undressing Lower body dressing      What is the patient wearing?: Pants, Incontinence brief     Lower body assist Assist for lower body dressing: Maximal Assistance - Patient 25 - 49%     Toileting Toileting    Toileting assist Assist for toileting: Total Assistance - Patient < 25%     Transfers Chair/bed transfer  Transfers assist  Chair/bed transfer activity did not occur: Safety/medical concerns  Chair/bed transfer assist level: Contact Guard/Touching assist     Locomotion Ambulation   Ambulation assist   Ambulation activity did not occur: Safety/medical concerns          Walk 10  feet activity   Assist  Walk 10 feet activity did not occur: Safety/medical concerns        Walk 50 feet activity   Assist Walk 50 feet with 2 turns activity did not occur: Safety/medical concerns         Walk 150 feet activity   Assist Walk 150 feet activity did not occur: Safety/medical concerns         Walk 10 feet on uneven surface   activity   Assist Walk 10 feet on uneven surfaces activity did not occur: Safety/medical concerns         Wheelchair     Assist Will patient use wheelchair at discharge?: Yes Type of Wheelchair: Manual Wheelchair activity did not occur: Safety/medical concerns  Wheelchair assist level: Supervision/Verbal cueing Max wheelchair distance: 150    Wheelchair 50 feet with 2 turns activity    Assist    Wheelchair 50 feet with 2 turns activity did not occur: Safety/medical concerns   Assist Level: Supervision/Verbal cueing   Wheelchair 150 feet activity     Assist  Wheelchair 150 feet activity did not occur: Safety/medical concerns   Assist Level: Supervision/Verbal cueing   Blood pressure 110/64, pulse 73, temperature 98 F (36.7 C), temperature source Oral, resp. rate 18, height 4\' 9"  (1.448 m), weight 65 kg, SpO2 97 %.   Medical Problem List and Plan: 1.  Deficits with mobility, transfers, self-care secondary to left cerebellar hemorrhage with history of strokes  Cont CIR PT, OT, SLP 2.  Antithrombotics: -DVT/anticoagulation:  Mechanical: Foot pump Bilateral lower extremities             -antiplatelet therapy: N/A due to bleed 3. Headaches/Pain Management: Added Ultram prn.   Increased Depakote ER to 750 mg daily and added norco for when tylneol and tramadol aren't effective.   Added 2 lidoderm patches 8pm to 8am daily for neck tightness, later received per patient preference 4. Mood: LCSW to follow for evaluation and support.              -antipsychotic agents: N/A 5. Neuropsych: This patient is fully capable of making decisions on his own behalf. 6. Skin/Wound Care: Routine pressure relief measures.  7. Fluids/Electrolytes/Nutrition: Monitor I/Os. Tolerating current diet.  8. Headaches: Persistent but appears to be improving.  Depakote added.  See #3 9. Leucocytosis: Resolved.    WBC 6.6 on 9/16  Continue to monitor 10. Hypokalemia: Continue daily  supplement and adjust as needed.    Potassium 5.0 on 9/16,    Potassium supplement decreased    9/30- rechecking in AM  10/1- K+ 5.0- off Potassium supplement- doesn't appear to be on med that causes high K+ will recheck on Monday and then f/u. 11. HTN: Monitor BP tid--continue atenolol               Vitals:   01/26/19 1906 01/27/19 0642  BP: 132/72 110/64  Pulse: 73 73  Resp: 18 18  Temp: 98.7 F (37.1 C) 98 F (36.7 C)  SpO2: 98% 97%   Better control 9/27 12. Seizure disorder: Continue Keppra 1500 mg bid.  Monitor reoccurrence.  No recent seizures, unchanged 15. 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  Overall improved with program  9/22- BM this AM outside bowel program  9/25- Suppository ,Mg citrate and enema--> +results  9/26--continue senokot, reassured pt that nausea should improve today  9/29- pt willing to try bowel program  again.  9/30- didn't ask- will order sorbitol and suppository tonight to clean him out.  10/1- cleaned out- "filled bedpan" per pt. 16. Attention/fatigue trial Provigil since doesn't raise BP as much 100 mg daily- can likely send out on Ritalin 17. Vertigo/dizziness/nausea-continue to follow   Patient sensitive to medications and will try to avoid lethargy  Benefit with essential oils, continue  9/21- on scopolamine patch- pt questionable if helping, but told PA it was.  9/22- will add meclizine prn for dizziness- let pt know could make sleepy.  9/23- hadn't taken as of yet- reminded him to ask for it.  9/24- no improvement with Meclizine-   9/28- didn't take over weekend- since not helpful- now having associated nausea- will give Compazine prn.  9/29- doesn't want to try Baclofen- will con't to offer  10/1- will d/c Meclizine since not helpful  10/2- will con't scopolamine  For now- pt doesn't want to d/c.  18. R hip dysplasia- was due to go to [facility] for surgery-on hold  at present 19.  Post stroke dysphagia:   Advanced to regular texture, thins on 9/11 20.  Neurogenic bladder  Discussed with nursing, change bladder scans to every 4 hours  9/23- bladder urgency- asking for caths very frequently-encourage q4 hours   9/24- bladder accident yesterday-  Cath q4  10/2- still cathing q4 hours 21. Cold/congestion  9/23- added Sudafed 3 mg q6 hours prn.  9/24- improved Sx's  LOS: 30 days A FACE TO FACE EVALUATION WAS PERFORMED  Carlos Ferguson 01/27/2019, 9:20 AM

## 2019-01-28 NOTE — Progress Notes (Signed)
[place] PHYSICAL MEDICINE & REHABILITATION PROGRESS NOTE   Subjective/Complaints:   Pt reports wants a bath- also butt not raw, however there is a spot on buttocks that is uncomfortable- cream they put on his bottom helps.  Ate breakfast- pushed himself to eat.    ROS: Patient denies fever, rash, sore throat, blurred vision, vomiting, diarrhea, cough, shortness of breath or chest pain, joint or back pain, headache, or mood change.    Objective:   No results found. Recent Labs    01/26/19 0501  WBC 5.3  HGB 14.6  HCT 42.2  PLT 185   Recent Labs    01/26/19 0501  NA 139  K 5.0  CL 103  CO2 28  GLUCOSE 98  BUN 15  CREATININE 0.95  CALCIUM 9.3    Intake/Output Summary (Last 24 hours) at 01/28/2019 1155 Last data filed at 01/28/2019 1106 Gross per 24 hour  Intake 900 ml  Output 975 ml  Net -75 ml     Physical Exam: Vital Signs Blood pressure 119/72, pulse 75, temperature 98.3 F (36.8 C), temperature source Oral, resp. rate 18, height 4\' 9"  (1.448 m), weight 65 kg, SpO2 97 %. Constitutional: No distress . Vital signsreviewed. Sitting up in bed; just finished breakfast, appropriate, still poor ST memory- is chronic, NAD HEENT: EOMI but has intermittent nystagmus esp if looks up, oral membranes moist Neck: supple; no trigger points palpated Cardiovascular: RRR without murmur.   Respiratory: CTA Bilaterally without wheezes or rales. Normal effort    GI: BS +, non-tender, non-distended  Skin: Healed incisions. Psych:bright affect;  cooperative. Musc: LE contractures Neurological: Alert Dysarthria Able to follow commands Motor: Bilateral upper extremities: 4+/5 proximal distal, without change No movement noted in bilateral lower extremities, without change- chronic due to spina bifida.   Assessment/Plan: 1. Functional deficits secondary to cerebellar hematoma/severe hydrocephalus- due to hypertensive crisis - with a hx of Chiari malformation and VP shunt as  well as spina bifida which require 3+ hours per day of interdisciplinary therapy in a comprehensive inpatient rehab setting.  Physiatrist is providing close team supervision and 24 hour management of active medical problems listed below.  Physiatrist and rehab team continue to assess barriers to discharge/monitor patient progress toward functional and medical goals  Care Tool:  Bathing    Body parts bathed by patient: Right arm, Left arm, Chest, Abdomen, Right upper leg, Left upper leg, Face, Front perineal area, Right lower leg, Left lower leg, Buttocks   Body parts bathed by helper: Buttocks Body parts n/a: Front perineal area, Buttocks(Pt declined this session)   Bathing assist Assist Level: Supervision/Verbal cueing     Upper Body Dressing/Undressing Upper body dressing   What is the patient wearing?: Pull over shirt    Upper body assist Assist Level: Set up assist    Lower Body Dressing/Undressing Lower body dressing      What is the patient wearing?: Pants, Incontinence brief     Lower body assist Assist for lower body dressing: Maximal Assistance - Patient 25 - 49%     Toileting Toileting    Toileting assist Assist for toileting: Total Assistance - Patient < 25%     Transfers Chair/bed transfer  Transfers assist  Chair/bed transfer activity did not occur: Safety/medical concerns  Chair/bed transfer assist level: Contact Guard/Touching assist     Locomotion Ambulation   Ambulation assist   Ambulation activity did not occur: Safety/medical concerns          Walk 10  feet activity   Assist  Walk 10 feet activity did not occur: Safety/medical concerns        Walk 50 feet activity   Assist Walk 50 feet with 2 turns activity did not occur: Safety/medical concerns         Walk 150 feet activity   Assist Walk 150 feet activity did not occur: Safety/medical concerns         Walk 10 feet on uneven surface  activity   Assist Walk  10 feet on uneven surfaces activity did not occur: Safety/medical concerns         Wheelchair     Assist Will patient use wheelchair at discharge?: Yes Type of Wheelchair: Manual Wheelchair activity did not occur: Safety/medical concerns  Wheelchair assist level: Supervision/Verbal cueing Max wheelchair distance: 150    Wheelchair 50 feet with 2 turns activity    Assist    Wheelchair 50 feet with 2 turns activity did not occur: Safety/medical concerns   Assist Level: Supervision/Verbal cueing   Wheelchair 150 feet activity     Assist  Wheelchair 150 feet activity did not occur: Safety/medical concerns   Assist Level: Supervision/Verbal cueing   Blood pressure 119/72, pulse 75, temperature 98.3 F (36.8 C), temperature source Oral, resp. rate 18, height 4\' 9"  (1.448 m), weight 65 kg, SpO2 97 %.   Medical Problem List and Plan: 1.  Deficits with mobility, transfers, self-care secondary to left cerebellar hemorrhage with history of strokes  Cont CIR PT, OT, SLP 2.  Antithrombotics: -DVT/anticoagulation:  Mechanical: Foot pump Bilateral lower extremities             -antiplatelet therapy: N/A due to bleed 3. Headaches/Pain Management: Added Ultram prn.   Increased Depakote ER to 750 mg daily and added norco for when tylneol and tramadol aren't effective.   Added 2 lidoderm patches 8pm to 8am daily for neck tightness, later received per patient preference 4. Mood: LCSW to follow for evaluation and support.              -antipsychotic agents: N/A 5. Neuropsych: This patient is fully capable of making decisions on his own behalf. 6. Skin/Wound Care: Routine pressure relief measures.  7. Fluids/Electrolytes/Nutrition: Monitor I/Os. Tolerating current diet.  8. Headaches: Persistent but appears to be improving.  Depakote added.  See #3 9. Leucocytosis: Resolved.    WBC 6.6 on 9/16  Continue to monitor 10. Hypokalemia: Continue daily supplement and adjust as  needed.    Potassium 5.0 on 9/16,    Potassium supplement decreased    9/30- rechecking in AM  10/1- K+ 5.0- off Potassium supplement- doesn't appear to be on med that causes high K+ will recheck on Monday and then f/u. 11. HTN: Monitor BP tid--continue atenolol               Vitals:   01/27/19 1924 01/28/19 0707  BP: 130/75 119/72  Pulse: 76 75  Resp: 18 18  Temp: 98.4 F (36.9 C) 98.3 F (36.8 C)  SpO2: 98% 97%   Better control 9/27 12. Seizure disorder: Continue Keppra 1500 mg bid.  Monitor reoccurrence.  No recent seizures, unchanged 15. 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  Overall improved with program  9/22- BM this AM outside bowel program  9/25- Suppository ,Mg citrate and enema--> +results  9/26--continue senokot, reassured pt that nausea should improve today  9/29- pt willing to try bowel program  again.  9/30- didn't ask- will order sorbitol and suppository tonight to clean him out.  10/1- cleaned out- "filled bedpan" per pt. 16. Attention/fatigue trial Provigil since doesn't raise BP as much 100 mg daily- can likely send out on Ritalin 17. Vertigo/dizziness/nausea-continue to follow   Patient sensitive to medications and will try to avoid lethargy  Benefit with essential oils, continue  9/21- on scopolamine patch- pt questionable if helping, but told PA it was.  9/22- will add meclizine prn for dizziness- let pt know could make sleepy.  9/23- hadn't taken as of yet- reminded him to ask for it.  9/24- no improvement with Meclizine-   9/28- didn't take over weekend- since not helpful- now having associated nausea- will give Compazine prn.  9/29- doesn't want to try Baclofen- will con't to offer  10/1- will d/c Meclizine since not helpful  10/2- will con't scopolamine  For now- pt doesn't want to d/c.  18. R hip dysplasia- was due to go to [facility] for surgery-on hold at present 19.  Post  stroke dysphagia:   Advanced to regular texture, thins on 9/11 20.  Neurogenic bladder  Discussed with nursing, change bladder scans to every 4 hours  9/23- bladder urgency- asking for caths very frequently-encourage q4 hours   9/24- bladder accident yesterday-  Cath q4  10/2- still cathing q4 hours 21. Cold/congestion  9/23- added Sudafed 3 mg q6 hours prn.  9/24- improved Sx's  LOS: 31 days A FACE TO FACE EVALUATION WAS PERFORMED  Lema Heinkel 01/28/2019, 11:55 AM

## 2019-01-29 ENCOUNTER — Inpatient Hospital Stay (HOSPITAL_COMMUNITY): Payer: Medicare Other | Admitting: Speech Pathology

## 2019-01-29 NOTE — Progress Notes (Signed)
[place] PHYSICAL MEDICINE & REHABILITATION PROGRESS NOTE   Subjective/Complaints:   Pt reports he got cleaned up this AM after BM overnight- doesn't feel good- not nauseated- feels "out of it" and very tired for last 10-15 minutes.  Wants to work through it- doesn't want any intervention.   ROS: Patient denies fever, rash, sore throat, blurred vision, vomiting, diarrhea, cough, shortness of breath or chest pain, joint or back pain, headache, or mood change.    Objective:   No results found. No results for input(s): WBC, HGB, HCT, PLT in the last 72 hours. No results for input(s): NA, K, CL, CO2, GLUCOSE, BUN, CREATININE, CALCIUM in the last 72 hours.  Intake/Output Summary (Last 24 hours) at 01/29/2019 1140 Last data filed at 01/29/2019 0813 Gross per 24 hour  Intake 360 ml  Output 750 ml  Net -390 ml     Physical Exam: Vital Signs Blood pressure 123/71, pulse 73, temperature 98.4 F (36.9 C), resp. rate 17, height 4\' 9"  (1.448 m), weight 65 kg, SpO2 100 %. Constitutional: No distress . Vital signsreviewed. Sitting up in bed; eating breakfast, appears more tired than usual, NAD HEENT: EOMI but has intermittent nystagmus esp if looks up, oral membranes moist Neck: supple; no trigger points palpated Cardiovascular: RRR without murmur.   Respiratory: CTA Bilaterally without wheezes or rales. Normal effort    GI: BS +, non-tender, non-distended  Skin: Healed incisions. Psych:bright affect;  cooperative. Musc: LE contractures Neurological: Alert Dysarthria Able to follow commands Motor: Bilateral upper extremities: 4+/5 proximal distal, without change No movement noted in bilateral lower extremities, without change- chronic due to spina bifida.   Assessment/Plan: 1. Functional deficits secondary to cerebellar hematoma/severe hydrocephalus- due to hypertensive crisis - with a hx of Chiari malformation and VP shunt as well as spina bifida which require 3+ hours per day of  interdisciplinary therapy in a comprehensive inpatient rehab setting.  Physiatrist is providing close team supervision and 24 hour management of active medical problems listed below.  Physiatrist and rehab team continue to assess barriers to discharge/monitor patient progress toward functional and medical goals  Care Tool:  Bathing    Body parts bathed by patient: Right arm, Left arm, Chest, Abdomen, Right upper leg, Left upper leg, Face, Front perineal area, Right lower leg, Left lower leg, Buttocks   Body parts bathed by helper: Buttocks Body parts n/a: Front perineal area, Buttocks(Pt declined this session)   Bathing assist Assist Level: Supervision/Verbal cueing     Upper Body Dressing/Undressing Upper body dressing   What is the patient wearing?: Pull over shirt    Upper body assist Assist Level: Set up assist    Lower Body Dressing/Undressing Lower body dressing      What is the patient wearing?: Pants, Incontinence brief     Lower body assist Assist for lower body dressing: Maximal Assistance - Patient 25 - 49%     Toileting Toileting    Toileting assist Assist for toileting: Total Assistance - Patient < 25%     Transfers Chair/bed transfer  Transfers assist  Chair/bed transfer activity did not occur: Safety/medical concerns  Chair/bed transfer assist level: Contact Guard/Touching assist     Locomotion Ambulation   Ambulation assist   Ambulation activity did not occur: Safety/medical concerns          Walk 10 feet activity   Assist  Walk 10 feet activity did not occur: Safety/medical concerns        Walk 50 feet activity  Assist Walk 50 feet with 2 turns activity did not occur: Safety/medical concerns         Walk 150 feet activity   Assist Walk 150 feet activity did not occur: Safety/medical concerns         Walk 10 feet on uneven surface  activity   Assist Walk 10 feet on uneven surfaces activity did not occur:  Safety/medical concerns         Wheelchair     Assist Will patient use wheelchair at discharge?: Yes Type of Wheelchair: Manual Wheelchair activity did not occur: Safety/medical concerns  Wheelchair assist level: Supervision/Verbal cueing Max wheelchair distance: 150    Wheelchair 50 feet with 2 turns activity    Assist    Wheelchair 50 feet with 2 turns activity did not occur: Safety/medical concerns   Assist Level: Supervision/Verbal cueing   Wheelchair 150 feet activity     Assist  Wheelchair 150 feet activity did not occur: Safety/medical concerns   Assist Level: Supervision/Verbal cueing   Blood pressure 123/71, pulse 73, temperature 98.4 F (36.9 C), resp. rate 17, height 4\' 9"  (1.448 m), weight 65 kg, SpO2 100 %.   Medical Problem List and Plan: 1.  Deficits with mobility, transfers, self-care secondary to left cerebellar hemorrhage with history of strokes  Cont CIR PT, OT, SLP 2.  Antithrombotics: -DVT/anticoagulation:  Mechanical: Foot pump Bilateral lower extremities             -antiplatelet therapy: N/A due to bleed 3. Headaches/Pain Management: Added Ultram prn.   Increased Depakote ER to 750 mg daily and added norco for when tylneol and tramadol aren't effective.   Added 2 lidoderm patches 8pm to 8am daily for neck tightness, later received per patient preference 4. Mood: LCSW to follow for evaluation and support.              -antipsychotic agents: N/A 5. Neuropsych: This patient is fully capable of making decisions on his own behalf. 6. Skin/Wound Care: Routine pressure relief measures.  7. Fluids/Electrolytes/Nutrition: Monitor I/Os. Tolerating current diet.  8. Headaches: Persistent but appears to be improving.  Depakote added.  See #3 9. Leucocytosis: Resolved.    WBC 6.6 on 9/16  Continue to monitor 10. Hypokalemia: Continue daily supplement and adjust as needed.    Potassium 5.0 on 9/16,    Potassium supplement decreased    9/30-  rechecking in AM  10/1- K+ 5.0- off Potassium supplement- doesn't appear to be on med that causes high K+ will recheck on Monday and then f/u. 11. HTN: Monitor BP tid--continue atenolol               Vitals:   01/28/19 1937 01/29/19 0502  BP: 123/75 123/71  Pulse: 63 73  Resp: 18 17  Temp: 98.4 F (36.9 C) 98.4 F (36.9 C)  SpO2: 96% 100%   Better control 10/4 12. Seizure disorder: Continue Keppra 1500 mg bid.  Monitor reoccurrence.  No recent seizures, unchanged 15. 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  Overall improved with program  9/22- BM this AM outside bowel program  9/25- Suppository ,Mg citrate and enema--> +results  9/26--continue senokot, reassured pt that nausea should improve today  9/29- pt willing to try bowel program again.  9/30- didn't ask- will order sorbitol and suppository tonight to clean him out.  10/1- cleaned out- "filled bedpan" per pt.  10/4- BM overnight- keeps mentioning that doesn't  have bowel program, but doesn't want one. 16. Attention/fatigue trial Provigil since doesn't raise BP as much 100 mg daily- can likely send out on Ritalin 17. Vertigo/dizziness/nausea-continue to follow   Patient sensitive to medications and will try to avoid lethargy  Benefit with essential oils, continue  9/21- on scopolamine patch- pt questionable if helping, but told PA it was.  9/22- will add meclizine prn for dizziness- let pt know could make sleepy.  9/23- hadn't taken as of yet- reminded him to ask for it.  9/24- no improvement with Meclizine-   9/28- didn't take over weekend- since not helpful- now having associated nausea- will give Compazine prn.  9/29- doesn't want to try Baclofen- will con't to offer  10/1- will d/c Meclizine since not helpful  10/2- will con't scopolamine  For now- pt doesn't want to d/c.  18. R hip dysplasia- was due to go to [facility] for surgery-on hold at  present 19.  Post stroke dysphagia:   Advanced to regular texture, thins on 9/11 20.  Neurogenic bladder  Discussed with nursing, change bladder scans to every 4 hours  9/23- bladder urgency- asking for caths very frequently-encourage q4 hours   9/24- bladder accident yesterday-  Cath q4  10/2- still cathing q4 hours 21. Cold/congestion  9/23- added Sudafed 3 mg q6 hours prn.  9/24- improved Sx's  LOS: 32 days A FACE TO FACE EVALUATION WAS PERFORMED  Carlos Ferguson 01/29/2019, 11:40 AM

## 2019-01-29 NOTE — Progress Notes (Signed)
Speech Language Pathology Daily Session Note  Patient Details  Name: Carlos Ferguson MRN: [id number] Date of Birth: [birth year]  Today's Date: 01/29/2019 SLP Individual Time: 1638-4665 SLP Individual Time Calculation (min): 34 min  Short Term Goals: Week 5: SLP Short Term Goal 1 (Week 5): Pt will complete semi-complex problem solving tasks with Min A cues. SLP Short Term Goal 2 (Week 5): Given Supervision A cues, pt will demonstrate use of 1 compensatory memory strategy to recall basic daily information. SLP Short Term Goal 3 (Week 5): Pt will demonstrate selective attention to task for ~ 40 minutes with Supervision A cues for redirection.  Skilled Therapeutic Interventions:  Pt was seen for skilled ST targeting problem solving goals.  Pt was sitting up in bed requesting to be cathed; however, when speaking with RN she indicated that pt was not due for cath for another 30 minutes.  Informed pt who was agreeable to working with therapist until it was time to be cathed. Therapist facilitated the session with a previously taught card game to address problem solving and recall goals.  Pt needed min-mod assist question cues to recall general rules of the game and mod faded to min assist verbal cues to plan and execute a problem solving strategy due to decreased working memory of task rules and procedures.  RN arrived towards the end of today's therapy session to cath pt and session was ended early as pt was becoming uncomfortable and reported feeling that his bladder was "full."  Pt was left in bed with nursing at bedside.  Continue per current plan of care.    Pain Pain Assessment Pain Scale: 0-10 Pain Score: 0-No pain  Therapy/Group: Individual Therapy  Cody Oliger, Selinda Orion 01/29/2019, 12:24 PM

## 2019-01-30 ENCOUNTER — Inpatient Hospital Stay (HOSPITAL_COMMUNITY): Payer: Medicare Other | Admitting: Speech Pathology

## 2019-01-30 ENCOUNTER — Inpatient Hospital Stay (HOSPITAL_COMMUNITY): Payer: Medicare Other | Admitting: Occupational Therapy

## 2019-01-30 ENCOUNTER — Inpatient Hospital Stay (HOSPITAL_COMMUNITY): Payer: Medicare Other | Admitting: Physical Therapy

## 2019-01-30 LAB — COMPREHENSIVE METABOLIC PANEL
ALT: 29 U/L (ref 0–44)
AST: 16 U/L (ref 15–41)
Albumin: 3.6 g/dL (ref 3.5–5.0)
Alkaline Phosphatase: 61 U/L (ref 38–126)
Anion gap: 8 (ref 5–15)
BUN: 9 mg/dL (ref 6–20)
CO2: 30 mmol/L (ref 22–32)
Calcium: 9 mg/dL (ref 8.9–10.3)
Chloride: 103 mmol/L (ref 98–111)
Creatinine, Ser: 0.62 mg/dL (ref 0.61–1.24)
GFR calc Af Amer: >60 mL/min (ref >60)
GFR calc non Af Amer: >60 mL/min (ref >60)
Glucose, Bld: 102 mg/dL — ABNORMAL HIGH (ref 70–99)
Potassium: 3.6 mmol/L (ref 3.5–5.1)
Sodium: 141 mmol/L (ref 135–145)
Total Bilirubin: 0.6 mg/dL (ref 0.3–1.2)
Total Protein: 6.6 g/dL (ref 6.5–8.1)

## 2019-01-30 LAB — CBC WITH DIFFERENTIAL/PLATELET
Abs Immature Granulocytes: 0.01 10*3/uL (ref 0.00–0.07)
Basophils Absolute: 0 10*3/uL (ref 0.0–0.1)
Basophils Relative: 0 %
Eosinophils Absolute: 0.1 10*3/uL (ref 0.0–0.5)
Eosinophils Relative: 1 %
HCT: 41.1 % (ref 39.0–52.0)
Hemoglobin: 14 g/dL (ref 13.0–17.0)
Immature Granulocytes: 0 %
Lymphocytes Relative: 18 %
Lymphs Abs: 0.8 10*3/uL (ref 0.7–4.0)
MCH: 32.3 pg (ref 26.0–34.0)
MCHC: 34.1 g/dL (ref 30.0–36.0)
MCV: 94.7 fL (ref 80.0–100.0)
Monocytes Absolute: 0.4 10*3/uL (ref 0.1–1.0)
Monocytes Relative: 9 %
Neutro Abs: 3.3 10*3/uL (ref 1.7–7.7)
Neutrophils Relative %: 72 %
Platelets: 190 10*3/uL (ref 150–400)
RBC: 4.34 MIL/uL (ref 4.22–5.81)
RDW: 14.6 % (ref 11.5–15.5)
WBC: 4.6 10*3/uL (ref 4.0–10.5)
nRBC: 0 % (ref 0.0–0.2)

## 2019-01-30 MED ORDER — SCOPOLAMINE 1 MG/3DAYS TD PT72
1.0000 | MEDICATED_PATCH | TRANSDERMAL | Status: DC
  Administered 2019-01-30 – 2019-02-02 (×2): 1.5 mg via TRANSDERMAL
  Filled 2019-01-30 (×2): qty 1

## 2019-01-30 NOTE — NC FL2 (Signed)
Carlos Ferguson LEVEL OF CARE SCREENING TOOL     IDENTIFICATION  Patient Name: Carlos Ferguson Birthdate: Jul 10, 1965 Sex: male Admission Date (Current Location): 12/28/2018  [facility] and [place] Number:  Carlos Ferguson)   Facility and Address:  [facility] [place], [place] [address] [place], [place] [address]      Provider Number: [id number]  Attending Physician Name and Address:  Courtney Heys, MD  Relative Name and Phone Number:       Current Level of Care: Other (Comment)(Acute Inpatient Rehab) Recommended Level of Care: [place] Prior Approval Number:    Date Approved/Denied:   PASRR Number:    Discharge Plan: SNF    Current Diagnoses: Patient Active Problem List   Diagnosis Date Noted  . Vertigo   . Frequent headaches 01/11/2019  . Dysphagia, post-stroke   . 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 Neurogenic bowel   . Neurogenic bladder, flaccid   . Essential hypertension 12/28/2018  . Obesity 12/28/2018  . Spina bifida ([place]) 12/28/2018  . Urinary retention 12/28/2018  . Seizures ([place]) 12/28/2018  . Hypokalemia 12/28/2018  . Intracranial hemorrhage, cerebellar ([place]) 12/28/2018  . Cerebellar bleed ([place])   . Leukocytosis   . Cytotoxic brain edema ([place]) 12/27/2018  . ICH (intracerebral hemorrhage) ([facility]) L cerebellar, etiology ? HTN 12/25/2018    Orientation RESPIRATION BLADDER Height & Weight     Self, Time, Situation, Place  Normal Incontinent(I/O caths needed) Weight: 143 lb 4.8 oz (65 kg) Height:  4\' 9"  (144.8 cm)  BEHAVIORAL SYMPTOMS/MOOD NEUROLOGICAL BOWEL NUTRITION STATUS      Continent Diet(regular)  AMBULATORY STATUS COMMUNICATION OF NEEDS Skin   Extensive Assist(wheelchair level mobility only) Verbally Other (Comment)(MASD to bottom/groin; barrier cream and nystatin as treatment)                       Personal Care Assistance Level of Assistance  Bathing,  Dressing Bathing Assistance: Limited assistance   Dressing Assistance: Limited assistance     Functional Limitations Info             [name]  PT (By licensed PT), OT (By licensed OT)     PT Frequency: 5x/wk OT Frequency: 5x/wk            Contractures Contractures Info: Not present    Additional Factors Info  Code Status, Allergies Code Status Info: Full Allergies Info: see MAR           Current Medications (01/30/2019):  This is the current hospital active medication list Current Facility-Administered Medications  Medication Dose Route Frequency Provider Last Rate Last Dose  . acetaminophen (TYLENOL) tablet 325-650 mg  325-650 mg Oral Q4H PRN Bary Leriche, PA-C   650 mg at 01/22/19 1533  . alum & mag hydroxide-simeth (MAALOX/MYLANTA) 200-200-20 MG/5ML suspension 30 mL  30 mL Oral Q4H PRN Bary Leriche, PA-C   30 mL at 01/09/19 1656  . atenolol (TENORMIN) tablet 75 mg  75 mg Oral Daily Bary Leriche, PA-C   75 mg at 01/29/19 6160  . bisacodyl (DULCOLAX) suppository 10 mg  10 mg Rectal Daily PRN Bary Leriche, PA-C   10 mg at 12/28/18 2057  . bisacodyl (DULCOLAX) suppository 10 mg  10 mg Rectal PRN Lovorn, Jinny Blossom, MD   10 mg at 01/20/19 0643  . calcium carbonate (TUMS - dosed in mg elemental calcium) chewable tablet 200 mg of  elemental calcium  1 tablet Oral TID PRN Jacquelynn Cree, PA-C   200 mg of elemental calcium at 01/28/19 1952  . diphenhydrAMINE (BENADRYL) 12.5 MG/5ML elixir 12.5-25 mg  12.5-25 mg Oral Q6H PRN Love, Pamela S, PA-C      . divalproex (DEPAKOTE ER) 24 hr tablet 750 mg  750 mg Oral Daily Lovorn, Megan, MD   750 mg at 01/29/19 0802  . enoxaparin (LOVENOX) injection 40 mg  40 mg Subcutaneous Q24H Love, Pamela S, PA-C   40 mg at 01/29/19 1410  . flavoxATE (URISPAS) tablet 100 mg  100 mg Oral TID PRN Jacquelynn Cree, PA-C   100 mg at 01/26/19 9622  . guaiFENesin-dextromethorphan (ROBITUSSIN DM) 100-10 MG/5ML syrup 5-10 mL  5-10 mL  Oral Q6H PRN Love, Pamela S, PA-C      . levETIRAcetam (KEPPRA) tablet 1,500 mg  1,500 mg Oral BID Delle Reining S, PA-C   1,500 mg at 01/29/19 2059  . liver oil-zinc oxide (DESITIN) 40 % ointment   Topical TID Lovorn, Megan, MD      . modafinil (PROVIGIL) tablet 100 mg  100 mg Oral Daily Lovorn, Megan, MD   100 mg at 01/29/19 0802  . nystatin (MYCOSTATIN/NYSTOP) topical powder   Topical BID Love, Pamela S, PA-C      . polyethylene glycol (MIRALAX / GLYCOLAX) packet 17 g  17 g Oral Daily PRN Jacquelynn Cree, PA-C   17 g at 01/20/19 0855  . prochlorperazine (COMPAZINE) tablet 5-10 mg  5-10 mg Oral Q6H PRN Jacquelynn Cree, PA-C   10 mg at 01/23/19 2979   Or  . prochlorperazine (COMPAZINE) injection 5-10 mg  5-10 mg Intramuscular Q6H PRN Jacquelynn Cree, PA-C   10 mg at 12/29/18 8921   Or  . prochlorperazine (COMPAZINE) suppository 12.5 mg  12.5 mg Rectal Q6H PRN Love, Pamela S, PA-C      . pseudoephedrine (SUDAFED) tablet 30 mg  30 mg Oral Q6H PRN Lovorn, Aundra Millet, MD   30 mg at 01/18/19 0947  . scopolamine (TRANSDERM-SCOP) 1 MG/3DAYS 1.5 mg  1 patch Transdermal Q72H Love, Pamela S, PA-C      . senna (SENOKOT) tablet 8.6 mg  1 tablet Oral Daily Lovorn, Megan, MD   8.6 mg at 01/29/19 0802  . sodium phosphate (FLEET) 7-19 GM/118ML enema 1 enema  1 enema Rectal Daily PRN Lovorn, Megan, MD      . traMADol (ULTRAM) tablet 50 mg  50 mg Oral Q6H PRN Jacquelynn Cree, PA-C   50 mg at 01/05/19 1941  . traZODone (DESYREL) tablet 25-50 mg  25-50 mg Oral QHS PRN Jacquelynn Cree, PA-C   50 mg at 01/26/19 1954     Discharge Medications: Please see discharge summary for a list of discharge medications.  Relevant Imaging Results:  Relevant Lab Results:   Additional Information SS# [id number]  Carlos Slane, LCSW

## 2019-01-30 NOTE — Progress Notes (Signed)
Occupational Therapy Session Note  Patient Details  Name: Carlos Ferguson MRN: [id number] Date of Birth: 1965/07/28  Today's Date: 01/30/2019 OT Individual Time: 1115-1200 OT Individual Time Calculation (min): 45 min    Short Term Goals: Week 1:  OT Short Term Goal 1 (Week 1): Pt will transfer with mod A +1 using LRAD in order to reduce caregiver burden OT Short Term Goal 1 - Progress (Week 1): Met OT Short Term Goal 2 (Week 1): Pt will complete LB dressing from bed level with min A OT Short Term Goal 2 - Progress (Week 1): Met OT Short Term Goal 3 (Week 1): Pt will tolerate sitting EOB/EOM with min A for 10 minutes during functional task with supervision to increase upright tolerance. OT Short Term Goal 3 - Progress (Week 1): Met Week 2:  OT Short Term Goal 1 (Week 2): Pt will direct caregiver in set-up of sliding board transfer with no more than 2 questioning cues OT Short Term Goal 1 - Progress (Week 2): Not met OT Short Term Goal 2 (Week 2): Pt will don pants from bed level with set-up/supervision using hospital bed functions. OT Short Term Goal 2 - Progress (Week 2): Met OT Short Term Goal 3 (Week 2): Pt will orient and don shirt with set-up assist OT Short Term Goal 3 - Progress (Week 2): Met Week 3:  OT Short Term Goal 1 (Week 3): STG=LTG awaiting SNF placement OT Short Term Goal 1 - Progress (Week 3): Progressing toward goal Week 4:  OT Short Term Goal 1 (Week 4): Cont to address LTG awaiting SNF placement  Skilled Therapeutic Interventions/Progress Updates:    Pt received in w/c ready for therapy and stated he would really like to focus on walking.  Discussed how we should focus on his sit to stand strength and coordination and standing endurance to prep him to be able to use RW. Pt is able to rise to stand with CGA to min A with RW, but his standing tolerance is only about 45 seconds. He was able to repeat this several times with rests in between. Chair pushups using lower arm  rests.   Pt stated he has used crutches for 50 years and feels this will be more natural for him. Asked him to discuss this with his main PT/OT team, but that crutches involve so much coordination and with his R hip he may need more stability from the RW.    Pt participated well, resting in wc with all needs met.  Therapy Documentation Precautions:  Precautions Precautions: Fall Restrictions Weight Bearing Restrictions: No   Pain: Pain Assessment Pain Score: 0-No pain   Therapy/Group: Individual Therapy  [place] 01/30/2019, 10:10 AM

## 2019-01-30 NOTE — Plan of Care (Signed)
  Problem: RH BOWEL ELIMINATION Goal: RH STG MANAGE BOWEL W/MEDICATION W/ASSISTANCE Description: STG Manage Bowel with Medication with max Assistance. Outcome: Progressing   Problem: RH BLADDER ELIMINATION Goal: RH STG MANAGE BLADDER WITH EQUIPMENT WITH ASSISTANCE Description: STG Manage Bladder With Equipment With supervision Assistance Outcome: Progressing   Problem: RH SKIN INTEGRITY Goal: RH STG MAINTAIN SKIN INTEGRITY WITH ASSISTANCE Description: STG Maintain Skin Integrity With mas Assistance. Outcome: Progressing Goal: RH STG ABLE TO PERFORM INCISION/WOUND CARE W/ASSISTANCE Description: STG Able To Perform Incision/Wound Care With max Assistance. Outcome: Progressing   Problem: RH SAFETY Goal: RH STG ADHERE TO SAFETY PRECAUTIONS W/ASSISTANCE/DEVICE Description: STG Adhere to Safety Precautions With min Assistance/Device. Outcome: Progressing   Problem: RH COGNITION-NURSING Goal: RH STG ANTICIPATES NEEDS/CALLS FOR ASSIST W/ASSIST/CUES Description: STG Anticipates Needs/Calls for Assist With min Assistance/Cues. Outcome: Progressing   Problem: RH PAIN MANAGEMENT Goal: RH STG PAIN MANAGED AT OR BELOW PT'S PAIN GOAL Description: Less than 3 Outcome: Progressing   Problem: RH KNOWLEDGE DEFICIT Goal: RH STG INCREASE KNOWLEDGE OF HYPERTENSION Description: Patient able to describe management of hypertension with cues/handouts Outcome: Progressing Goal: RH STG INCREASE KNOWLEDGE OF STROKE PROPHYLAXIS Description: Patient able to describe prophylaxis to avoid stroke with cues/handout Outcome: Progressing

## 2019-01-30 NOTE — Progress Notes (Signed)
[place] PHYSICAL MEDICINE & REHABILITATION PROGRESS NOTE   Subjective/Complaints:  No issues overnite   ROS: Patient deniesCP, SOB, N/V/D   Objective:   No results found. No results for input(s): WBC, HGB, HCT, PLT in the last 72 hours. No results for input(s): NA, K, CL, CO2, GLUCOSE, BUN, CREATININE, CALCIUM in the last 72 hours.  Intake/Output Summary (Last 24 hours) at 01/30/2019 0934 Last data filed at 01/30/2019 0507 Gross per 24 hour  Intake 600 ml  Output 550 ml  Net 50 ml     Physical Exam: Vital Signs Blood pressure 133/80, pulse 68, temperature 98.3 F (36.8 C), temperature source Oral, resp. rate 18, height 4\' 9"  (1.448 m), weight 65 kg, SpO2 98 %. Constitutional: No distress . Vital signsreviewed. Sitting up in bed; eating breakfast, appears more tired than usual, NAD HEENT: EOMI but has intermittent nystagmus esp if looks up, oral membranes moist Neck: supple; no trigger points palpated Cardiovascular: RRR without murmur.   Respiratory: CTA Bilaterally without wheezes or rales. Normal effort    GI: BS +, non-tender, non-distended  Skin: Healed incisions. Psych:bright affect;  cooperative. Musc: LE contractures Neurological: Alert Dysarthria Able to follow commands Motor: Bilateral upper extremities: 4+/5 proximal distal, without change No movement noted in bilateral lower extremities, without change- chronic due to spina bifida.   Assessment/Plan: 1. 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  Physiatrist is providing close team supervision and 24 hour management of active medical problems listed below.  Physiatrist and rehab team continue to assess barriers to discharge/monitor patient progress toward functional and  medical goals  Care Tool:  Bathing    Body parts bathed by patient: Right arm, Left arm, Chest, Abdomen, Right upper leg, Left upper leg, Face, Front perineal area, Right lower leg, Left lower leg, Buttocks   Body parts bathed by helper: Buttocks Body parts n/a: Front perineal area, Buttocks(Pt declined this session)   Bathing assist Assist Level: Supervision/Verbal cueing     Upper Body Dressing/Undressing Upper body dressing   What is the patient wearing?: Pull over shirt    Upper body assist Assist Level: Set up assist    Lower Body Dressing/Undressing Lower body dressing      What is the patient wearing?: Pants, Incontinence brief     Lower body assist Assist for lower body dressing: Maximal Assistance - Patient 25 - 49%     Toileting Toileting    Toileting assist Assist for toileting: Total Assistance - Patient < 25%     Transfers Chair/bed transfer  Transfers assist  Chair/bed transfer activity did not occur: Safety/medical concerns  Chair/bed transfer assist level: Contact Guard/Touching assist     Locomotion Ambulation   Ambulation assist   Ambulation activity did not occur: Safety/medical concerns          Walk 10 feet activity   Assist  Walk 10 feet activity did not occur: Safety/medical concerns        Walk 50 feet activity   Assist Walk 50 feet with 2 turns activity did not occur: Safety/medical concerns         Walk 150 feet activity   Assist Walk 150 feet activity did not occur: Safety/medical concerns         Walk 10 feet on uneven surface  activity  Assist Walk 10 feet on uneven surfaces activity did not occur: Safety/medical concerns         Wheelchair     Assist Will patient use wheelchair at discharge?: Yes Type of Wheelchair: Manual Wheelchair activity did not occur: Safety/medical concerns  Wheelchair assist level: Supervision/Verbal cueing Max wheelchair distance: 150    Wheelchair 50  feet with 2 turns activity    Assist    Wheelchair 50 feet with 2 turns activity did not occur: Safety/medical concerns   Assist Level: Supervision/Verbal cueing   Wheelchair 150 feet activity     Assist  Wheelchair 150 feet activity did not occur: Safety/medical concerns   Assist Level: Supervision/Verbal cueing   Blood pressure 133/80, pulse 68, temperature 98.3 F (36.8 C), temperature source Oral, resp. rate 18, height 4\' 9"  (1.448 m), weight 65 kg, SpO2 98 %.   Medical Problem List and Plan: 1.  Deficits with mobility, transfers, self-care secondary to left cerebellar hemorrhage with history of strokes  Cont CIR PT, OT, SLP 2.  Antithrombotics: -DVT/anticoagulation:  Mechanical: Foot pump Bilateral lower extremities             -antiplatelet therapy: N/A due to bleed 3. Headaches/Pain Management: Added Ultram prn.   Increased Depakote ER to 750 mg daily and added norco for when tylneol and tramadol aren't effective.   Added 2 lidoderm patches 8pm to 8am daily for neck tightness, later received per patient preference 4. Mood: LCSW to follow for evaluation and support.              -antipsychotic agents: N/A 5. Neuropsych: This patient is fully capable of making decisions on his own behalf. 6. Skin/Wound Care: Routine pressure relief measures.  7. Fluids/Electrolytes/Nutrition: Monitor I/Os. Tolerating current diet.  8. Headaches: Persistent but appears to be improving.  Depakote added.  See #3 9. Leucocytosis: Resolved.    WBC 6.6 on 9/16  Continue to monitor 10. Hypokalemia: Continue daily supplement and adjust as needed.    Potassium 5.0 on 9/16,    Potassium supplement decreased    9/30- rechecking in AM  10/1- K+ 5.0- off Potassium supplement- doesn't appear to be on med that causes high K+ will recheck on Monday and then f/u. 11. HTN: Monitor BP tid--continue atenolol               Vitals:   01/29/19 1917 01/30/19 0511  BP: 131/77 133/80  Pulse: 75 68   Resp: 18 18  Temp: 98.5 F (36.9 C) 98.3 F (36.8 C)  SpO2: 98% 98%   Controlled 10/5 12. Seizure disorder: Continue Keppra 1500 mg bid.  Monitor reoccurrence.  No recent seizures, unchanged 15. 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  Overall improved with program  9/22- BM this AM outside bowel program  9/25- Suppository ,Mg citrate and enema--> +results  9/26--continue senokot, reassured pt that nausea should improve today  9/29- pt willing to try bowel program again.  9/30- didn't ask- will order sorbitol and suppository tonight to clean him out.  10/1- cleaned out- "filled bedpan" per pt.  10/4- BM overnight- keeps mentioning that doesn't have bowel program, but doesn't want one. 16. Attention/fatigue trial Provigil since doesn't raise BP as much 100 mg daily- can likely send out on Ritalin 17. Vertigo/dizziness/nausea-continue to follow   Patient sensitive to medications and will try to avoid lethargy  Benefit with essential oils, continue  9/21- on scopolamine patch-  pt questionable if helping, but told PA it was.  9/22- will add meclizine prn for dizziness- let pt know could make sleepy.  9/23- hadn't taken as of yet- reminded him to ask for it.  9/24- no improvement with Meclizine-   9/28- didn't take over weekend- since not helpful- now having associated nausea- will give Compazine prn.  9/29- doesn't want to try Baclofen- will con't to offer  10/1- will d/c Meclizine since not helpful  10/2- will con't scopolamine  For now- pt doesn't want to d/c.  18. R hip dysplasia- was due to go to [facility] for surgery-on hold at present 19.  Post stroke dysphagia:   Advanced to regular texture, thins on 9/11 20.  Neurogenic bladder  Discussed with nursing, change bladder scans to every 4 hours  9/23- bladder urgency- asking for caths very frequently-encourage q4 hours   9/24- bladder accident yesterday-   Cath q4  10/2- still cathing q4 hours 21. Cold/congestion  9/23- added Sudafed 3 mg q6 hours prn.  9/24- improved Sx's  LOS: 33 days A FACE TO FACE EVALUATION WAS PERFORMED  Erick Colacendrew E Kamy Poinsett 01/30/2019, 9:34 AM

## 2019-01-30 NOTE — Progress Notes (Signed)
Physical Therapy Session Note  Patient Details  Name: Carlos Ferguson MRN: [id number] Date of Birth: Feb 26, 1966  Today's Date: 01/30/2019 PT Individual Time: 0930-1045 PT Individual Time Calculation (min): 75 min   Short Term Goals: Week 5:  PT Short Term Goal 1 (Week 5): Pt will complete bed mobility at mod I level PT Short Term Goal 2 (Week 5): Pt will complete least restrictive transfers with Supervision PT Short Term Goal 3 (Week 5): Pt will be able to stand x 30 sec while performing functional task  Skilled Therapeutic Interventions/Progress Updates:    Pt received seated in bed, agreeable to PT session. No complaints of pain this AM, does report increase in dizziness. Pt rates dizziness at 7/10 this AM, no change during therapy session. Seated BP 142/86. Reviewed functional implications of cerebellar CVA and residual effects such a dizziness and vision changes. Pt is mod A to don BLE braces while long-sitting in bed, min A to don pants with rolling L/R with CGA to pull pants up over hips, setup A to don shoes. Long-sitting to sitting EOB with Supervision. Slide board transfer bed to w/c with CGA. Manual w/c propulsion x 150 ft with use of BUE and Supervision. Slide board transfer w/c to/from mat table with CGA. Sit to stand x 4 reps to RW with min A. Pt demos improved ability to stand upright with BLE under him in stance. Pt is able to take several steps forward/back with RW and min A. Pt continues to fatigue quickly with standing activity. Sitting EOM to long-sitting to supine with Supervision. Rolling L/R on flat mat table with no bedrails with CGA, v/c for LE management. Pt is able to return to sitting with Supervision. Ascend/descend ramp x 4 reps with use of BUE and CGA for initial ascent progressing to Supervision. Pt left seated in w/c in room with needs in reach at end of session.  Therapy Documentation Precautions:  Precautions Precautions: Fall Restrictions Weight Bearing  Restrictions: No    Therapy/Group: Individual Therapy   Excell Seltzer, PT, DPT  01/30/2019, 12:37 PM

## 2019-01-30 NOTE — Progress Notes (Signed)
Occupational Therapy Session Note  Patient Details  Name: Carlos Ferguson MRN: [id number] Date of Birth: March 01, 1966  Today's Date: 01/30/2019 OT Individual Time: 1300-1400 OT Individual Time Calculation (min): 60 min    Short Term Goals: Week 4:  OT Short Term Goal 1 (Week 4): Cont to address LTG awaiting SNF placement  Skilled Therapeutic Interventions/Progress Updates:    Pt seen for OT ADL bathing/dressing session. Pt sitting up in w/c upon arrival finishing lunch, agreeable to tx session and denying pain, requesting to shower.  Completed sliding board transfer to tub transfer bench with guarding assist and VCs for anterior weight shift. He bathed seated on TTB with min A for washing LEs, pt not feeling safe to reach down to wash feet 2/2 dizziness. Upon setting up to exit shower, pt noted to be having incontinent BM. Total A for hygiene while on TTB. Exited shower in same manner as described above.  Grooming tasks completed mod I from w/c level at sink.  He transferred back to bed in order to thorough hygiene to be completed at bed level total A and new brief donned. UB completed with set-up. Pt left in supine at end of session, all needs in reach and bed alarm on.   Therapy Documentation Precautions:  Precautions Precautions: Fall Restrictions Weight Bearing Restrictions: No   Therapy/Group: Individual Therapy  Shineka Auble L 01/30/2019, 7:11 AM

## 2019-01-30 NOTE — Progress Notes (Signed)
Speech Language Pathology Daily Session Note  Patient Details  Name: Carlos Ferguson MRN: [id number] Date of Birth: October 23, 1965  Today's Date: 01/30/2019 SLP Individual Time: 0730-0828 SLP Individual Time Calculation (min): 58 min  Short Term Goals: Week 5: SLP Short Term Goal 1 (Week 5): Pt will complete semi-complex problem solving tasks with Min A cues. SLP Short Term Goal 2 (Week 5): Given Supervision A cues, pt will demonstrate use of 1 compensatory memory strategy to recall basic daily information. SLP Short Term Goal 3 (Week 5): Pt will demonstrate selective attention to task for ~ 40 minutes with Supervision A cues for redirection.  Skilled Therapeutic Interventions: Pt was seen for skilled ST targeting cognitive goals. Pt was sleeping upon entrance to his room, however very easily aroused to soft voice of SLP and RN. Pt independently recalled this SLP's name for the first time today, demonstrating use of a compensatory memory strategy we stablished in session last Friday (association). Pt accurately recalled he only had 1 therapy session over the weekend with Supervision A question cues. Min A verbal cues provided for pt to use memory notebook in order to verbally recall 2 of 4 compensatory memory strategies (other 2 were recalled with Supervision A question cues). Min A verbal cues required for pt to locate external aid posted in room in order to recall his MD's name. SLP further facilitated session with a novel memory game to practice making associations as compensatory memory strategy. Pt made associations throughout game with Supervision A verbal cues, however required Mod-Max A to recall those associations throughout game. Pt was left in bed with alarm set and all needs within reach. Continue per current plan of care.      Pain Pain Assessment Pain Score: 0-No pain  Therapy/Group: Individual Therapy  Arbutus Leas 01/30/2019, 9:52 AM

## 2019-01-31 ENCOUNTER — Inpatient Hospital Stay (HOSPITAL_COMMUNITY): Payer: Medicare Other | Admitting: Occupational Therapy

## 2019-01-31 ENCOUNTER — Inpatient Hospital Stay (HOSPITAL_COMMUNITY): Payer: Medicare Other | Admitting: Speech Pathology

## 2019-01-31 ENCOUNTER — Inpatient Hospital Stay (HOSPITAL_COMMUNITY): Payer: Medicare Other | Admitting: Physical Therapy

## 2019-01-31 MED ORDER — DIVALPROEX SODIUM ER 500 MG PO TB24
500.0000 mg | ORAL_TABLET | Freq: Every day | ORAL | Status: DC
  Administered 2019-02-01 – 2019-02-02 (×2): 500 mg via ORAL
  Filled 2019-01-31 (×2): qty 1

## 2019-01-31 NOTE — Progress Notes (Signed)
Occupational Therapy Session Note  Patient Details  Name: Carlos Ferguson MRN: [id number] Date of Birth: 1965/10/17  Today's Date: 01/31/2019 OT Individual Time: 4854-6270 OT Individual Time Calculation (min): 60 min    Short Term Goals: Week 4:  OT Short Term Goal 1 (Week 4): Cont to address LTG awaiting SNF placement  Skilled Therapeutic Interventions/Progress Updates:    Pt seen for OT session focusing on ADL re-training. Pt sitting upright in bed upon arrival with hand off from SLP. Pt denying pain and requesting to self-cath. Pt able to self-cath with set-up assist. Reports feeling much better following void. He donned B socks, braces,threaded pants and donned shoes from supported long sitting position with set-up. He transitioned to sitting EOB and stood from EOB with therapist sitting in supportive chair in front of him. Pt supporting hands on armrests of chair and stood with min A. PT able to maintain static standing position with min A and legs braced against side of bed while therapist pulled pants up total A. Pt very excited about ADLs returning to being done in similar manner as they were prior to CVA.  He completed CGA sliding board transfer into w/c.  Grooming tasks completed mod I from w/c level at sink.  Completed UE strengthening from w/c level using level I (orange) theraband. Completed x2 sets of 12 of the following exercises with multi-modal cuing for proper form and technique: Shoulder ABduction, Diagonal up, diagonal down, upright row.  Pt left seated in w/c at end of session, all needs in reach. Pt very excited about functional gains being made. He was unable to recall events from SLP session 1hr earlier. Required min questioning cues to recall sequence of OT session at end of session when writing in memory notebook.   Therapy Documentation Precautions:  Precautions Precautions: Fall Restrictions Weight Bearing Restrictions: No   Therapy/Group: Individual  Therapy  Jakyah Bradby L 01/31/2019, 7:02 AM

## 2019-01-31 NOTE — Progress Notes (Signed)
Speech Language Pathology Daily Session Note  Patient Details  Name: Carlos Ferguson MRN: [id number] Date of Birth: November 07, 1965  Today's Date: 01/31/2019 SLP Individual Time: 0730-0830 SLP Individual Time Calculation (min): 60 min  Short Term Goals: Week 5: SLP Short Term Goal 1 (Week 5): Pt will complete semi-complex problem solving tasks with Min A cues. SLP Short Term Goal 2 (Week 5): Given Supervision A cues, pt will demonstrate use of 1 compensatory memory strategy to recall basic daily information. SLP Short Term Goal 3 (Week 5): Pt will demonstrate selective attention to task for ~ 40 minutes with Supervision A cues for redirection.  Skilled Therapeutic Interventions: Pt was seen for skilled ST targeting cognition. In functional conversation, pt used association memory compensatory strategy to recall therapist's name and discipline with Supervision A question cues. Pt also used external aid posted in room with Supervision A to identify Dr and other therapists' names. SLP facilitated session with Supervision-Min A verbal cues for error awareness during complex medication management task (setting up BID pill box from list of current daily medications). Pt demonstrated greatly improved problem solving and organization throughout this task in comparison to previously targeted sessions; this was 3rd ST session targeting this task, and carryover of previously learned skills evidenced in his performance today. SLP also facilitated session with Min A verbal cues for organization and complex problem solving during a deductive reasoning puzzle (furniture delivery). Pt selectively attended to tasks for ~40 minutes prior to requiring Min A cues for redirection. Pt became quite internally distracted following conversation with MD regarding memory and balance deficits.      Pain Pain Assessment Pain Score: 0-No pain  Therapy/Group: Individual Therapy  Arbutus Leas 01/31/2019, 8:38 AM

## 2019-01-31 NOTE — Progress Notes (Signed)
Physical Therapy Session Note  Patient Details  Name: Carlos Ferguson MRN: [id number] Date of Birth: June 17, 1965  Today's Date: 01/31/2019 PT Individual Time: 1300-1415 PT Individual Time Calculation (min): 75 min   Short Term Goals: Week 5:  PT Short Term Goal 1 (Week 5): Pt will complete bed mobility at mod I level PT Short Term Goal 2 (Week 5): Pt will complete least restrictive transfers with Supervision PT Short Term Goal 3 (Week 5): Pt will be able to stand x 30 sec while performing functional task  Skilled Therapeutic Interventions/Progress Updates:    Pt received seated in w/c in room finishing lunch, agreeable to PT session. No complaints of pain at beginning of session. Manual w/c propulsion x 150 ft with use of BUE and Supervision. Sit to stand with min A to RW. Ambulation 3 x 7' with mod A and RW, close w/c follow for safety. Pt continues to demonstrate decreased ability to advance RLE due to hip dysplasia and generalized weakness. Pt does exhibit increased tolerance for gait training this date. Pt encouraged by his ability to ambulate this date. Education with patient that using his crutches at this point is not safe nor functional due to heavy reliance on BUE and decreased overall balance with standing and gait. Pt understanding of need to use RW for gait training at this time. Pt does report RLE soreness following gait training, RN able to provide Tylenol at end of session. Slide board transfer w/c to/from mat table with CGA. Seated balance EOM reaching outside BOS and across midline for horseshoes then performing horseshoe toss, CGA for balance. Seated 1kg weighted ball therex: punch-outs, R/L diagonals 2 x 10 reps each. Long-sitting weighted dowel exercises with 3# dowel: bicep curls, tricep ext, chest press x 10 reps each. Pt left seated in w/c in room with needs in reach in care of RN at end of session.  Therapy Documentation Precautions:  Precautions Precautions:  Fall Restrictions Weight Bearing Restrictions: No    Therapy/Group: Individual Therapy   Excell Seltzer, PT, DPT  01/31/2019, 3:38 PM

## 2019-01-31 NOTE — Progress Notes (Signed)
[place] PHYSICAL MEDICINE & REHABILITATION PROGRESS NOTE   Subjective/Complaints:  Pt reports HA is completely gone- sore neck also resolved.  Is upset- wants to walk with crutches- explained has since 53 yrs old, but explained didn't have CVA until now, which is why likely needs to walk with RW at this point- might be able to do crutches once dizziness/vertigo has improved.     ROS: Patient deniesCP, SOB, N/V/D   Objective:   No results found. Recent Labs    01/30/19 0946  WBC 4.6  HGB 14.0  HCT 41.1  PLT 190   Recent Labs    01/30/19 0946  NA 141  K 3.6  CL 103  CO2 30  GLUCOSE 102*  BUN 9  CREATININE 0.62  CALCIUM 9.0    Intake/Output Summary (Last 24 hours) at 01/31/2019 1346 Last data filed at 01/31/2019 1230 Gross per 24 hour  Intake 578 ml  Output 925 ml  Net -347 ml     Physical Exam: Vital Signs Blood pressure 131/81, pulse 69, temperature 98.1 F (36.7 C), resp. rate 16, height 4\' 9"  (1.448 m), weight 65 kg, SpO2 99 %. Constitutional: No distress . Vital signs reviewed. Sitting up in bed; frustrated that can't walk with crutches, SLP in room, more talkative over this issue; still admits to vertigo- hasn't changed in weeks- meds don't help. HEENT: EOMI but has intermittent nystagmus esp if looks up, oral membranes moist Neck: supple; no trigger points palpated Cardiovascular: RRR without murmur.   Respiratory: CTA Bilaterally without wheezes or rales. Normal effort    GI: BS +, non-tender, non-distended  Skin: Healed incisions. Psych:bright affect;  cooperative. Musc: LE contractures Neurological: Alert Dysarthria Able to follow commands Motor: Bilateral upper extremities: 4+/5 proximal distal, without change No movement noted in bilateral lower extremities, without change- chronic due to spina bifida.   Assessment/Plan: 1. Functional deficits secondary to cerebellar hematoma/severe hydrocephalus- due to hypertensive crisis - with a hx of  Chiari malformation and VP shunt as well as spina bifida which require 3+ hours per day of interdisciplinary therapy in a comprehensive inpatient rehab setting.  Physiatrist is providing close team supervision and 24 hour management of active medical problems listed below.  Physiatrist and rehab team continue to assess barriers to discharge/monitor patient progress toward functional and medical goals  Care Tool:  Bathing    Body parts bathed by patient: Right arm, Left arm, Chest, Abdomen, Right upper leg, Left upper leg, Face, Front perineal area, Buttocks   Body parts bathed by helper: Right lower leg, Left lower leg Body parts n/a: Front perineal area, Buttocks(Pt declined this session)   Bathing assist Assist Level: Minimal Assistance - Patient > 75%     Upper Body Dressing/Undressing Upper body dressing   What is the patient wearing?: Pull over shirt    Upper body assist Assist Level: Set up assist    Lower Body Dressing/Undressing Lower body dressing      What is the patient wearing?: Pants, Incontinence brief     Lower body assist Assist for lower body dressing: Minimal Assistance - Patient > 75%     Toileting Toileting    Toileting assist Assist for toileting: Total Assistance - Patient < 25%     Transfers Chair/bed transfer  Transfers assist  Chair/bed transfer activity did not occur: Safety/medical concerns  Chair/bed transfer assist level: Contact Guard/Touching assist     Locomotion Ambulation   Ambulation assist   Ambulation activity did not occur: Safety/medical  concerns          Walk 10 feet activity   Assist  Walk 10 feet activity did not occur: Safety/medical concerns        Walk 50 feet activity   Assist Walk 50 feet with 2 turns activity did not occur: Safety/medical concerns         Walk 150 feet activity   Assist Walk 150 feet activity did not occur: Safety/medical concerns         Walk 10 feet on uneven  surface  activity   Assist Walk 10 feet on uneven surfaces activity did not occur: Safety/medical concerns         Wheelchair     Assist Will patient use wheelchair at discharge?: Yes Type of Wheelchair: Manual Wheelchair activity did not occur: Safety/medical concerns  Wheelchair assist level: Supervision/Verbal cueing Max wheelchair distance: 150    Wheelchair 50 feet with 2 turns activity    Assist    Wheelchair 50 feet with 2 turns activity did not occur: Safety/medical concerns   Assist Level: Supervision/Verbal cueing   Wheelchair 150 feet activity     Assist  Wheelchair 150 feet activity did not occur: Safety/medical concerns   Assist Level: Supervision/Verbal cueing   Blood pressure 131/81, pulse 69, temperature 98.1 F (36.7 C), resp. rate 16, height 4\' 9"  (1.448 m), weight 65 kg, SpO2 99 %.   Medical Problem List and Plan: 1.  Deficits with mobility, transfers, self-care secondary to left cerebellar hemorrhage with history of strokes  Cont CIR PT, OT, SLP  -can't use crutches right now due to dizziness- pt unhappy about this- [facility] D/w SLP 2.  Antithrombotics: -DVT/anticoagulation:  Mechanical: Foot pump Bilateral lower extremities             -antiplatelet therapy: N/A due to bleed 3. Headaches/Pain Management: Added Ultram prn.   Increased Depakote ER to 750 mg daily and added norco for when tylneol and tramadol aren't effective.   Added 2 lidoderm patches 8pm to 8am daily for neck tightness, later received per patient preference  10/6- will decrease Depakote to 500 mg daily and see if OK- pt wants to reduce meds. 4. Mood: LCSW to follow for evaluation and support.              -antipsychotic agents: N/A 5. Neuropsych: This patient is fully capable of making decisions on his own behalf. 6. Skin/Wound Care: Routine pressure relief measures.  7. Fluids/Electrolytes/Nutrition: Monitor I/Os. Tolerating current diet.  8.  Headaches: Persistent but appears to be improving.  Depakote added.  See #3- reduce Depakote to 500 mg daily.  9. Leucocytosis: Resolved.    WBC 6.6 on 9/16  Continue to monitor 10. Hypokalemia: Continue daily supplement and adjust as needed.    Potassium 5.0 on 9/16,    Potassium supplement decreased    9/30- rechecking in AM  10/1- K+ 5.0- off Potassium supplement- doesn't appear to be on med that causes high K+ will recheck on Monday and then f/u.  10/6- K+ 3.6- will recheck Thursday.  11. HTN: Monitor BP tid--continue atenolol               Vitals:   01/31/19 0442 01/31/19 1036  BP: 132/76 131/81  Pulse: 69   Resp: 16   Temp: 98.1 F (36.7 C)   SpO2: 99%    Controlled 10/5 12. Seizure disorder: Continue Keppra 1500 mg bid.  Monitor reoccurrence.  No recent seizures,  unchanged 15. 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  Overall improved with program  9/22- BM this AM outside bowel program  9/25- Suppository Mg citrate and enema--> +results  9/26--continue senokot, reassured pt that nausea should improve today  9/29- pt willing to try bowel program again.  9/30- didn't ask- will order sorbitol and suppository tonight to clean him out.  10/1- cleaned out- "filled bedpan" per pt.  10/4- BM overnight- keeps mentioning that doesn't have bowel program, but doesn't want one. 16. Attention/fatigue trial Provigil since doesn't raise BP as much 100 mg daily- can likely send out on Ritalin 17. Vertigo/dizziness/nausea-continue to follow   Patient sensitive to medications and will try to avoid lethargy  Benefit with essential oils, continue  9/21- on scopolamine patch- pt questionable if helping, but told PA it was.  9/22- will add meclizine prn for dizziness- let pt know could make sleepy.  9/23- hadn't taken as of yet- reminded him to ask for it.  9/24- no improvement with Meclizine-   9/28- didn't  take over weekend- since not helpful- now having associated nausea- will give Compazine prn.  9/29- doesn't want to try Baclofen- will con't to offer  10/1- will d/c Meclizine since not helpful  10/2- will con't scopolamine  For now- pt doesn't want to d/c.  18. R hip dysplasia- was due to go to [facility] for surgery-on hold at present 19.  Post stroke dysphagia:   Advanced to regular texture, thins on 9/11 20.  Neurogenic bladder  Discussed with nursing, change bladder scans to every 4 hours  9/23- bladder urgency- asking for caths very frequently-encourage q4 hours   9/24- bladder accident yesterday-  Cath q4  10/2- still cathing q4 hours 21. Cold/congestion  9/23- added Sudafed 3 mg q6 hours prn.  9/24- improved Sx's  LOS: 34 days A FACE TO FACE EVALUATION WAS PERFORMED  Jerame Hedding 01/31/2019, 1:46 PM

## 2019-02-01 ENCOUNTER — Inpatient Hospital Stay (HOSPITAL_COMMUNITY): Payer: Medicare Other | Admitting: Physical Therapy

## 2019-02-01 ENCOUNTER — Inpatient Hospital Stay (HOSPITAL_COMMUNITY): Payer: Medicare Other

## 2019-02-01 ENCOUNTER — Inpatient Hospital Stay (HOSPITAL_COMMUNITY): Payer: Medicare Other | Admitting: Speech Pathology

## 2019-02-01 ENCOUNTER — Inpatient Hospital Stay (HOSPITAL_COMMUNITY): Payer: Medicare Other | Admitting: Occupational Therapy

## 2019-02-01 DIAGNOSIS — N390 Urinary tract infection, site not specified: Secondary | ICD-10-CM

## 2019-02-01 LAB — URINALYSIS, ROUTINE W REFLEX MICROSCOPIC
Bilirubin Urine: NEGATIVE
Glucose, UA: NEGATIVE mg/dL
Hgb urine dipstick: NEGATIVE
Ketones, ur: 5 mg/dL — AB
Nitrite: POSITIVE — AB
Protein, ur: NEGATIVE mg/dL
Specific Gravity, Urine: 1.026 (ref 1.005–1.030)
pH: 5 (ref 5.0–8.0)

## 2019-02-01 LAB — SARS CORONAVIRUS 2 BY RT PCR (HOSPITAL ORDER, PERFORMED IN ~~LOC~~ HOSPITAL LAB): SARS Coronavirus 2: NEGATIVE

## 2019-02-01 MED ORDER — ATENOLOL 25 MG PO TABS: 75.0000 mg | ORAL_TABLET | Freq: Every day | ORAL | Status: DC

## 2019-02-01 MED ORDER — SCOPOLAMINE 1 MG/3DAYS TD PT72: 1.0000 | MEDICATED_PATCH | TRANSDERMAL | 12 refills | Status: DC

## 2019-02-01 MED ORDER — PSEUDOEPHEDRINE HCL 30 MG PO TABS: 30.0000 mg | ORAL_TABLET | Freq: Four times a day (QID) | ORAL | 0 refills | Status: DC | PRN

## 2019-02-01 MED ORDER — DIVALPROEX SODIUM ER 500 MG PO TB24: 500.0000 mg | ORAL_TABLET | Freq: Every day | ORAL | Status: DC

## 2019-02-01 MED ORDER — SENNA 8.6 MG PO TABS: 1.0000 | ORAL_TABLET | Freq: Every day | ORAL | 0 refills | Status: DC

## 2019-02-01 MED ORDER — FLAVOXATE HCL 100 MG PO TABS: 100.0000 mg | ORAL_TABLET | Freq: Three times a day (TID) | ORAL | 0 refills | Status: AC | PRN

## 2019-02-01 MED ORDER — OXYBUTYNIN CHLORIDE 5 MG PO TABS
5.0000 mg | ORAL_TABLET | Freq: Three times a day (TID) | ORAL | Status: DC
  Administered 2019-02-01 – 2019-02-02 (×3): 5 mg via ORAL
  Filled 2019-02-01 (×3): qty 1

## 2019-02-01 MED ORDER — ZINC OXIDE 40 % EX OINT: TOPICAL_OINTMENT | Freq: Three times a day (TID) | CUTANEOUS | 0 refills | Status: DC

## 2019-02-01 MED ORDER — MODAFINIL 100 MG PO TABS: 100.0000 mg | ORAL_TABLET | Freq: Every day | ORAL | 0 refills | Status: DC

## 2019-02-01 MED ORDER — OXYBUTYNIN CHLORIDE 5 MG PO TABS: 5.0000 mg | ORAL_TABLET | Freq: Three times a day (TID) | ORAL | Status: AC

## 2019-02-01 NOTE — Progress Notes (Signed)
Occupational Therapy Discharge Summary  Patient Details  Name: Carlos Ferguson MRN: [id number] Date of Birth: 26-Mar-1966  Patient has met 7 of 7 long term goals due to improved activity tolerance, improved balance, postural control, ability to compensate for deficits, improved attention, improved awareness and improved coordination.  Patient to discharge at [facility] Assist level.  Patient's elderly father is unable to provide the needed physical assistance at discharge and therefore to d/c to SNF in order to cont to decrease burden of care and increase independence with ADLs.  He is completing bathing/dressing routine from long sitting position in bed with set-up/supervision, rolling to complete pericare/buttock hygiene and pull pants up. He is able to stand from EOB with min A and UE support while caregiver pulls pants up.  He is completing sliding board transfers with CGA-supervision.  Pt limited by vestibular deficits, poor historian of what is new since CVA vs. PLOF.     Recommendation:  Patient will benefit from ongoing skilled OT services in skilled nursing facility setting to continue to advance functional skills in the area of BADL and Reduce care partner burden.  Equipment: TBD at next venue of care  Reasons for discharge: treatment goals met and discharge from hospital  Patient/family agrees with progress made and goals achieved: Yes  OT Discharge Precautions/Restrictions  Precautions Precautions: Fall Precaution Comments: nystagmus and dizziness Required Braces or Orthoses: (Wears B AFOs) Restrictions Weight Bearing Restrictions: No Vision Baseline Vision/History: Wears glasses Wears Glasses: Reading only Patient Visual Report: Nausea/blurring vision with head movement Vision Assessment?: Yes Alignment/Gaze Preference: Head turned Tracking/Visual Pursuits: Decreased smoothness of horizontal tracking Additional Comments: Nystagmus when tracking R/L Perception   Perception: Within Functional Limits Praxis Praxis: Intact Cognition Overall Cognitive Status: Impaired/Different from baseline Arousal/Alertness: Awake/alert Orientation Level: Oriented X4 Attention: Selective Sustained Attention: Impaired Sustained Attention Impairment: Verbal basic;Functional basic Selective Attention: Impaired Selective Attention Impairment: Verbal complex Memory: Impaired Memory Impairment: Decreased short term memory;Decreased recall of new information Decreased Short Term Memory: Functional basic;Verbal basic Awareness: Impaired Awareness Impairment: Emergent impairment Problem Solving: Impaired Problem Solving Impairment: Verbal complex;Functional complex Executive Function: Writer: Impaired Organizing Impairment: Verbal complex;Functional complex Safety/Judgment: Appears intact Sensation Sensation Light Touch: Impaired Detail Light Touch Impaired Details: Impaired LLE;Impaired RLE Proprioception: Impaired Detail Proprioception Impaired Details: Absent RLE;Absent LLE Coordination Gross Motor Movements are Fluid and Coordinated: No Fine Motor Movements are Fluid and Coordinated: Yes Coordination and Movement Description: Impaired 2/2 spina bifida and generalized weakness/deconditioning Motor  Motor Motor: Other (comment) Motor - Discharge Observations: impaired 2/2 spina bifida Trunk/Postural Assessment  Cervical Assessment Cervical Assessment: Exceptions to WFL(Forward head) Thoracic Assessment Thoracic Assessment: Exceptions to WFL(Kyphotic; Rounded shoulders) Lumbar Assessment Lumbar Assessment: Exceptions to WFL(Posterior pelvic tilt) Postural Control Postural Control: Deficits on evaluation Righting Reactions: Insufficient  Balance Balance Balance Assessed: Yes Static Sitting Balance Static Sitting - Balance Support: Feet supported;No upper extremity supported Static Sitting - Level of Assistance: 6: Modified  independent (Device/Increase time) Static Sitting - Comment/# of Minutes: Sitting EOB Dynamic Sitting Balance Dynamic Sitting - Balance Support: During functional activity;No upper extremity supported;Feet supported Dynamic Sitting - Level of Assistance: 5: Stand by assistance Sitting balance - Comments: Sitting on TTB to bathe Static Standing Balance Static Standing - Balance Support: During functional activity;Bilateral upper extremity supported Static Standing - Level of Assistance: 4: Min assist Extremity/Trunk Assessment RUE Assessment RUE Assessment: Within Functional Limits LUE Assessment LUE Assessment: Within Functional Limits   Navon Kotowski L 02/01/2019, 3:45 PM

## 2019-02-01 NOTE — Progress Notes (Signed)
Physical Therapy Weekly Progress Note  Patient Details  Name: Carlos Ferguson MRN: [id number] Date of Birth: 1966-01-09  Beginning of progress report period: January 25, 2019 End of progress report period: February 01, 2019  Today's Date: 02/01/2019 PT Individual Time: 1000-1100 PT Individual Time Calculation (min): 60 min   Patient has met 1 of 3 short term goals.  Pt continues to make slow but steady progress. Pt is at Supervision level for bed mobility, Supervision to CGA for slide board transfers, Supervision for w/c mobility, min A for sit to stand to RW and has been able to ambulate x 7' with RW and assist x 2 for safety.  Patient continues to demonstrate the following deficits muscle weakness, abnormal tone and unbalanced muscle activation, decreased problem solving, decreased safety awareness, decreased memory and delayed processing and decreased sitting balance, decreased standing balance, decreased postural control and decreased balance strategies and therefore will continue to benefit from skilled PT intervention to increase functional independence with mobility.  Patient progressing toward long term goals..  Continue plan of care.  PT Short Term Goals Week 5:  PT Short Term Goal 1 (Week 5): Pt will complete bed mobility at mod I level PT Short Term Goal 1 - Progress (Week 5): Progressing toward goal PT Short Term Goal 2 (Week 5): Pt will complete least restrictive transfers with Supervision PT Short Term Goal 2 - Progress (Week 5): Progressing toward goal PT Short Term Goal 3 (Week 5): Pt will be able to stand x 30 sec while performing functional task PT Short Term Goal 3 - Progress (Week 5): Met Week 6:  PT Short Term Goal 1 (Week 6): =LTG due to ELOS  Skilled Therapeutic Interventions/Progress Updates:    Pt received seated in bed, reports he is "feeling rough" today due to some bowel incontinence overnight. Pt appears more fatigued this session but agreeable to participate in  therapy. No complaints of pain. Assisted pt with cleaning off shoes and braces due to bowel incontinence previous evening for thoroughness of cleaning. Pt is unable to don shoes or braces during session as they need to dry. Pt is setup A to don pants via rolling L/R with use of bedrails. Pt is setup A to don socks while in long-sitting. Long-sitting to sitting EOB with Supervision with use of bed features. Slide board transfer bed to w/c with CGA. Setup pt with theraband behind BLE to keep LE in place due to LE not reaching leg rests without shoes in place and leg rests already in maximally shortened position. Manual w/c propulsion 2 x 150 ft with use of BUE and Supervision. Slide board transfer w/c to/from mat table with CGA. Pt requires increased time between therapy tasks today due to increase in fatigue this date. Pt left seated in w/c in room with needs in reach at end of session.  Therapy Documentation Precautions:  Precautions Precautions: Fall Restrictions Weight Bearing Restrictions: No   Therapy/Group: Individual Therapy   Excell Seltzer, PT, DPT 02/01/2019, 12:45 PM

## 2019-02-01 NOTE — Progress Notes (Signed)
Occupational Therapy Session Note  Patient Details  Name: Carlos Ferguson MRN: [id number] Date of Birth: 1965/12/23  Today's Date: 02/01/2019 OT Individual Time: 1300-1400 OT Individual Time Calculation (min): 60 min    Short Term Goals: Week 4:  OT Short Term Goal 1 (Week 4): Cont to address LTG awaiting SNF placement  Skilled Therapeutic Interventions/Progress Updates:    Pt seen for OT ADL bathing/dressing session. Pt sitting up in w/c upon arrival, finishing lunch and agreeable to tx session, requesting to shower. Denied pain.  He completed CGA sliding board transfer w/c > TTB with VCs for technique. HE bathed seated on TTB with supervision, lateral leans to complete buttock hygiene and able to lift B LEs in order to reach to wash.  He returned to w/c and then transitioned back to long sitting in bed in order to dress. Set-up assist to don B socks, AFOs, thread pants and don shoes. He transitioned to sitting EOB and stood from EOB with therapist sitting in standard chair in front of him, CGA for standing balance while therapist assisted with pants. He returned to supine and able to roll to fully advance pants up and to fasten.  Completed sliding board transfer back to bed. Pt left seated in w/c at end of session, all needs in reach.   Therapy Documentation Precautions:  Precautions Precautions: Fall Restrictions Weight Bearing Restrictions: No     Therapy/Group: Individual Therapy  [name] L 02/01/2019, 7:05 AM

## 2019-02-01 NOTE — Progress Notes (Signed)
Social Work Patient ID: Carlos Ferguson, male   DOB: [birth year], 53 y.o.   MRN: [id number]   Have received a SNF bed offer from [facility] and [facility] in [place].  Pt and father aware and accepting bed offer with plan to admit tomorrow.  Plan to transport via [place].  Have alerted MD and team.  Lennart Pall, LCSW

## 2019-02-01 NOTE — Progress Notes (Signed)
Physical Therapy Session Note  Patient Details  Name: Mak Bonny MRN: [id number] Date of Birth: [birth year]  Today's Date: 02/01/2019 PT Individual Time: 1445-1515 PT Individual Time Calculation (min): 30 min   Short Term Goals: Week 5:  PT Short Term Goal 1 (Week 5): Pt will complete bed mobility at mod I level PT Short Term Goal 2 (Week 5): Pt will complete least restrictive transfers with Supervision PT Short Term Goal 3 (Week 5): Pt will be able to stand x 30 sec while performing functional task  Skilled Therapeutic Interventions/Progress Updates: Pt presented in w/c agreeable to therapy. Pt denies pain throughout session. In hallway pt participated in gait training x 3 trials with PTA providing modA with w/c follow. Pt ambulated 40f, 628f and [id number] with poor advancement of LLE on third bout. Pt then propelled to rehab gym and participated in seated UE therex with 1kg weighted ball. Pt performed ball toss/chest press, overhead reach, and PNF D1/D2 patterns to fatigue (approx 15-20 reps). Pt transported back to room at end of session and remained in w/c with call bell within reach and needs met.      Therapy Documentation Precautions:  Precautions Precautions: Fall Restrictions Weight Bearing Restrictions: No General:   Vital Signs: Therapy Vitals Temp: 99 F (37.2 C) Temp Source: Oral Pulse Rate: 84 Resp: 18 BP: 138/75 Patient Position (if appropriate): Sitting Oxygen Therapy SpO2: 97 % O2 Device: Room Air   Therapy/Group: Individual Therapy  Daron Stutz  Leomia Blake, PTA  02/01/2019, 3:24 PM

## 2019-02-01 NOTE — Progress Notes (Signed)
Speech Language Pathology Discharge Summary  Patient Details  Name: Carlos Ferguson MRN: [id number] Date of Birth: 09/20/1965  Today's Date: 02/01/2019      Patient has met 7 of 8 long term goals.  Patient to discharge at overall Supervision;Min level.  Reasons goals not met: severe short term memory impairments and inconsistent use of compensatory memory strategies/aids   Clinical Impression/Discharge Summary:   Pt made functional gains and met 7 out of 8 long term goals this admission. Pt currently requires Supervision-Min assist for basic cognitive tasks. Pt is consuming regular diet with thin liquids with independent use of swallow strategies. Pt has demonstrated improved oropharyngeal swallow function, expressive language, basic problem solving, selective attention, and recall of basic daily/safety precautions. However, given severe short term memory deficits and higher level (executive functioning) impairments still present with semi-complex tasks, recommend pt continue to receive skilled ST services upon discharge to SNF. Pt education is complete at this time (no family was present for education with ST).    Care Partner:  Caregiver Able to Provide Assistance: No     Recommendation:  Skilled Nursing facility;24 hour supervision/assistance  Rationale for SLP Follow Up: Maximize cognitive function and independence   Equipment: none   Reasons for discharge: Discharged from hospital   Patient/Family Agrees with Progress Made and Goals Achieved: Yes    Erin E Smith 02/01/2019, 3:15 PM    

## 2019-02-01 NOTE — Progress Notes (Signed)
[place] PHYSICAL MEDICINE & REHABILITATION PROGRESS NOTE   Subjective/Complaints:  Pt reports HA is still not an issue with decrease in Depakote, but just got change this AM.  Had a real blow out last night- might be more interested in bowel program but admits "never had one" is his excuse for being reluctant.  Nurse felt he shouldn't have more yoohoo and told his dad- its' the only thing that tastes normal.  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   ROS: Patient denies CP, SOB, N/V/D   Objective:   No results found. Recent Labs    01/30/19 0946  WBC 4.6  HGB 14.0  HCT 41.1  PLT 190   Recent Labs    01/30/19 0946  NA 141  K 3.6  CL 103  CO2 30  GLUCOSE 102*  BUN 9  CREATININE 0.62  CALCIUM 9.0    Intake/Output Summary (Last 24 hours) at 02/01/2019 1156 Last data filed at 02/01/2019 0838 Gross per 24 hour  Intake 514 ml  Output 1075 ml  Net -561 ml     Physical Exam: Vital Signs Blood pressure 125/67, pulse 68, temperature 98.3 F (36.8 C), resp. rate 15, height 4\' 9"  (1.448 m), weight 65 kg, SpO2 99 %. Constitutional: No distress . Vital signs reviewed. Sitting up in bed;with SLP at bedside; using memory strategies to remember doctor name; NAD HEENT: EOMI but has intermittent nystagmus esp if looks up, oral membranes moist Neck: supple; no trigger points palpated Cardiovascular: RRR without murmur.   Respiratory: CTA Bilaterally without wheezes or rales. Normal effort    GI: BS +, non-tender, non-distended  Skin: Healed incisions. Psych:bright affect;  cooperative. Musc: LE contractures Neurological: Alert Dysarthria Able to follow commands Motor: Bilateral upper extremities: 4+/5 proximal distal, without change No movement noted in bilateral lower extremities, without change- chronic due to spina bifida.   Assessment/Plan: 1.  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  Physiatrist is providing close team supervision and 24 hour management of active medical problems listed below.  Physiatrist and rehab team continue to assess barriers to discharge/monitor patient progress toward functional and medical goals  Care Tool:  Bathing    Body parts bathed by patient: Right arm, Left arm, Chest, Abdomen, Right upper leg, Left upper leg, Face, Front perineal area, Buttocks   Body parts bathed by helper: Right lower leg, Left lower leg Body parts n/a: Front perineal area, Buttocks(Pt declined this session)   Bathing assist Assist Level: Minimal Assistance - Patient > 75%     Upper Body Dressing/Undressing Upper body dressing   What is the patient wearing?: Pull over shirt    Upper body assist Assist Level: Set up assist    Lower Body Dressing/Undressing Lower body dressing      What is the patient wearing?: Pants, Incontinence brief     Lower body assist Assist for lower body dressing: Minimal Assistance - Patient > 75%     Toileting Toileting    Toileting assist Assist for toileting: Total Assistance - Patient < 25%     Transfers Chair/bed transfer  Transfers assist  Chair/bed transfer activity did not occur:  Safety/medical concerns  Chair/bed transfer assist level: Contact Guard/Touching assist     Locomotion Ambulation   Ambulation assist   Ambulation activity did not occur: Safety/medical concerns  Assist level: 2 helpers Assistive device: Walker-rolling Max distance: 7'   Walk 10 feet activity   Assist  Walk 10 feet activity did not occur: Safety/medical concerns        Walk 50 feet activity   Assist Walk 50 feet with 2 turns activity did  not occur: Safety/medical concerns         Walk 150 feet activity   Assist Walk 150 feet activity did not occur: Safety/medical concerns         Walk 10 feet on uneven surface  activity   Assist Walk 10 feet on uneven surfaces activity did not occur: Safety/medical concerns         Wheelchair     Assist Will patient use wheelchair at discharge?: Yes Type of Wheelchair: Manual Wheelchair activity did not occur: Safety/medical concerns  Wheelchair assist level: Supervision/Verbal cueing Max wheelchair distance: 150    Wheelchair 50 feet with 2 turns activity    Assist    Wheelchair 50 feet with 2 turns activity did not occur: Safety/medical concerns   Assist Level: Supervision/Verbal cueing   Wheelchair 150 feet activity     Assist  Wheelchair 150 feet activity did not occur: Safety/medical concerns   Assist Level: Supervision/Verbal cueing   Blood pressure 125/67, pulse 68, temperature 98.3 F (36.8 C), resp. rate 15, height 4\' 9"  (1.448 m), weight 65 kg, SpO2 99 %.   Medical Problem List and Plan: 1.  Deficits with mobility, transfers, self-care secondary to left cerebellar hemorrhage with history of strokes  Cont CIR PT, OT, SLP  -can't use crutches right now due to dizziness- pt unhappy about this- [facility]LP 2.  Antithrombotics: -DVT/anticoagulation:  Mechanical: Foot pump Bilateral lower extremities             -antiplatelet therapy: N/A due to bleed 3. Headaches/Pain Management: Added Ultram prn.   Increased Depakote ER to 750 mg daily and added norco for when tylneol and tramadol aren't effective.   Added 2 lidoderm patches 8pm to 8am daily for neck tightness, later received per patient preference  10/6- will decrease Depakote to 500 mg daily and see if OK- pt wants to reduce meds. 4. Mood: LCSW to follow for evaluation and support.              -antipsychotic agents: N/A 5. Neuropsych: This patient is fully  capable of making decisions on his own behalf. 6. Skin/Wound Care: Routine pressure relief measures.  7. Fluids/Electrolytes/Nutrition: Monitor I/Os. Tolerating current diet.  8. Headaches: Persistent but appears to be improving.  Depakote added.  See #3- reduce Depakote to 500 mg daily.  9. Leucocytosis: Resolved.    WBC 6.6 on 9/16  Continue to monitor 10. Hypokalemia: Continue daily supplement and adjust as needed.    Potassium 5.0 on 9/16,    Potassium supplement decreased    9/30- rechecking in AM  10/1- K+ 5.0- off Potassium supplement- doesn't appear to be on med that causes high K+ will recheck on Monday and then f/u.  10/6- K+ 3.6- will recheck Thursday.  11. HTN: Monitor BP tid--continue atenolol               Vitals:   01/31/19 1930 02/01/19 0430  BP: 132/75 125/67  Pulse: 67 68  Resp:  17 15  Temp: 98.8 F (37.1 C) 98.3 F (36.8 C)  SpO2: 96% 99%   Controlled 10/7 12. Seizure disorder: Continue Keppra 1500 mg bid.  Monitor reoccurrence.  No recent seizures, unchanged 15. 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  Overall improved with program  9/22- BM this AM outside bowel program  9/25- Suppository Mg citrate and enema--> +results  9/26--continue senokot, reassured pt that nausea should improve today  9/29- pt willing to try bowel program again.  9/30- didn't ask- will order sorbitol and suppository tonight to clean him out.  10/1- cleaned out- "filled bedpan" per pt.  10/4- BM overnight- keeps mentioning that doesn't have bowel program, but doesn't want one.  10/7- blwo out last night- will see if nursing home will do bowel program for pt- if will, will have pt do. 16. Attention/fatigue trial Provigil since doesn't raise BP as much 100 mg daily- can likely send out on Ritalin 17. Vertigo/dizziness/nausea-continue to follow   Patient sensitive to medications and will try to avoid  lethargy  Benefit with essential oils, continue  9/21- on scopolamine patch- pt questionable if helping, but told PA it was.  9/22- will add meclizine prn for dizziness- let pt know could make sleepy.  9/23- hadn't taken as of yet- reminded him to ask for it.  9/24- no improvement with Meclizine-   9/28- didn't take over weekend- since not helpful- now having associated nausea- will give Compazine prn.  9/29- doesn't want to try Baclofen- will con't to offer  10/1- will d/c Meclizine since not helpful  10/2- will con't scopolamine  For now- pt doesn't want to d/c.  18. R hip dysplasia- was due to go to [facility] for surgery-on hold at present 19.  Post stroke dysphagia:   Advanced to regular texture, thins on 9/11 20.  Neurogenic bladder  Discussed with nursing, change bladder scans to every 4 hours  9/23- bladder urgency- asking for caths very frequently-encourage q4 hours   9/24- bladder accident yesterday-  Cath q4  10/2- still cathing q4 hours  10/7- leaking in between caths every 2-4 hours- will check with Urology if pt could need oxybytynin- if so, will then start if needed 21. Cold/congestion  9/23- added Sudafed 3 mg q6 hours prn.  9/24- improved Sx's  LOS: 35 days A FACE TO FACE EVALUATION WAS PERFORMED  Keane Martelli 02/01/2019, 11:56 AM

## 2019-02-01 NOTE — Patient Care Conference (Signed)
Inpatient RehabilitationTeam Conference and Plan of Care Update Date: 02/01/2019   Time: 9:45 AM    Patient Name: Carlos Ferguson      Medical Record Number: [id number]  Date of Birth: 30-May-1965 Sex: Male         Room/Bed: 4M10C/4M10C-01 Payor Info: Payor: MEDICARE / Plan: MEDICARE PART A AND B / Product Type: *No Product type* /    Admit Date/Time:  12/28/2018  1:27 PM  Primary Diagnosis:  Intracranial hemorrhage, cerebellar [facility])  Patient Active Problem List   Diagnosis Date Noted  . Vertigo   . Frequent headaches 01/11/2019  . Dysphagia, post-stroke   . 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 Neurogenic bowel   . Neurogenic bladder, flaccid   . Essential hypertension 12/28/2018  . Obesity 12/28/2018  . Spina bifida ([place]) 12/28/2018  . Urinary retention 12/28/2018  . Seizures ([place]) 12/28/2018  . Hypokalemia 12/28/2018  . Intracranial hemorrhage, cerebellar ([place]) 12/28/2018  . Cerebellar bleed ([place])   . Leukocytosis   . Cytotoxic brain edema ([place]) 12/27/2018  . ICH (intracerebral hemorrhage) ([facility]) L cerebellar, etiology ? HTN 12/25/2018    Expected Discharge Date: Expected Discharge Date: (SNF)  Team Members Present:       Current Status/Progress Goal Weekly Team Focus  Bowel/Bladder   incontinent of bowel; self-caths Q4-6hr  timed toileting maintain regular bowel pattern  assist with tolieting needs prn   Swallow/Nutrition/ Hydration   regular/thin, independent  goals met  n/a - goals met   ADL's   Supervision sliding board transfers, LB dressing bed level and sit>stand to pull pants up with min A. Set-up UB dressing, set-up bathing from long sitting position, sit>stand min A  Downgraded LB dressing to min A, Bathing with supervision bed level, Set-up UB bathing/dressing  ADL re-training, functional standing balance/endurance, functional transfers, activity tolerance   Mobility   S bed mobility, CGA SB transfer, SB w/c  mobility, min A to stand to RW and take a few steps  Supervision overall  standing tolerance, gait initiation, transfers   Communication   Supervision  Supervision A - goal met  n/a - goals met   Safety/Cognition/ Behavioral Observations  Supervision A basic, Min A semi-complex  Supervision A (basic)  semi-complex problem solving, compensatory memory strategies, selective attention   Pain   denies c/o pain  remain pain free  assess pain level QS and prn   Skin   MASD to groin and buttocks, nystatin powder and barrier cream  improve current skin issues; remain free of new skin breakdown/infection  assess skin QS and prn    Rehab Goals Patient on target to meet rehab goals: Yes *See Care Plan and progress notes for long and short-term goals.     Barriers to Discharge  Current Status/Progress Possible Resolutions Date Resolved   Nursing                  PT  Decreased caregiver support;Home environment access/layout                 OT                  SLP                SW                Discharge Planning/Teaching Needs:  Plan has changed to SNF as father cannot provide needed level of care.  NA  Team Discussion:  MD adjusting meds;  CG with sl board tfs.  Min to dtand and mod assist amb 7'.  STM issues the focus for ST.  Still working on SNF placement  Revisions to Treatment Plan:  NA    Medical Summary Current Status: fixated on caths q2-4 hours- low volumes- using urospas- brief is wet per OT- oxybutynin? call urology Weekly Focus/Goal: bowel program/bladder spasms???  Barriers to Discharge: Incontinence;Neurogenic Bowel & Bladder;Behavior;Decreased family/caregiver support;Home enviroment access/layout;Other (comments)  Barriers to Discharge Comments: spina bifida Possible Resolutions to Barriers: bladder spasms?- call urology   Continued Need for Acute Rehabilitation Level of Care: The patient requires daily medical management by a physician with specialized  training in physical medicine and rehabilitation for the following reasons: Direction of a multidisciplinary physical rehabilitation program to maximize functional independence : Yes Medical management of patient stability for increased activity during participation in an intensive rehabilitation regime.: Yes Analysis of laboratory values and/or radiology reports with any subsequent need for medication adjustment and/or medical intervention. : Yes   I attest that I was present, lead the team conference, and concur with the assessment and plan of the team.   Carrington Olazabal 02/01/2019, 1:14 PM

## 2019-02-01 NOTE — Progress Notes (Signed)
Speech Language Pathology Daily Session Note  Patient Details  Name: Carlos Ferguson MRN: [id number] Date of Birth: 1965-11-22  Today's Date: 02/01/2019 SLP Individual Time: 0730-0828 SLP Individual Time Calculation (min): 58 min  Short Term Goals: Week 5: SLP Short Term Goal 1 (Week 5): Pt will complete semi-complex problem solving tasks with Min A cues. SLP Short Term Goal 2 (Week 5): Given Supervision A cues, pt will demonstrate use of 1 compensatory memory strategy to recall basic daily information. SLP Short Term Goal 3 (Week 5): Pt will demonstrate selective attention to task for ~ 40 minutes with Supervision A cues for redirection.  Skilled Therapeutic Interventions: Pt was seen for skilled ST targeting cognition. SLP facilitated session with Min-Mod A verbal cues for organization and complex problem solving during a familiar deductive reasoning puzzle. SLP further facilitated session with Min A verbal cues to use memory notebook in order to recall 4 compensatory memory strategies. Pt consisently uses association strategy to recall SLP's name now, with no more than Supervision A. He also recalled functional information such as how to safely complete a slide board transfer from bed to wheelchair with Supervision A verbal question cues from clinician. Provided Min A cues to use external aid (worksheet), pt also verbally described and demonstrated 1 band exercise he is working on in PT. Pt was left in bed with alarm set and call bell within reach. Continue per current plan of care.      Pain Pain Assessment Pain Scale: 0-10 Pain Score: 0-No pain  Therapy/Group: Individual Therapy  Arbutus Leas 02/01/2019, 9:50 AM

## 2019-02-02 MED ORDER — SULFAMETHOXAZOLE-TRIMETHOPRIM 800-160 MG PO TABS
1.0000 | ORAL_TABLET | Freq: Two times a day (BID) | ORAL | Status: DC
  Administered 2019-02-02: 11:00:00 1 via ORAL
  Filled 2019-02-02 (×2): qty 1

## 2019-02-02 MED ORDER — MODAFINIL 100 MG PO TABS: 100.0000 mg | ORAL_TABLET | Freq: Every day | ORAL | 0 refills | Status: DC

## 2019-02-02 MED ORDER — SULFAMETHOXAZOLE-TRIMETHOPRIM 800-160 MG PO TABS: 1.0000 | ORAL_TABLET | Freq: Two times a day (BID) | ORAL | Status: DC

## 2019-02-02 NOTE — Plan of Care (Signed)
  Problem: Consults Goal: RH STROKE PATIENT EDUCATION Description: See Patient Education module for education specifics  Outcome: Completed/Met Goal: Nutrition Consult-if indicated Outcome: Completed/Met   Problem: RH BOWEL ELIMINATION Goal: RH STG MANAGE BOWEL WITH ASSISTANCE Description: STG Manage Bowel with max Assistance. Outcome: Completed/Met Goal: RH STG MANAGE BOWEL W/MEDICATION W/ASSISTANCE Description: STG Manage Bowel with Medication with max Assistance. Outcome: Completed/Met   Problem: RH BLADDER ELIMINATION Goal: RH STG MANAGE BLADDER WITH ASSISTANCE Description: STG Manage Bladder With mod Assistance Outcome: Completed/Met Goal: RH STG MANAGE BLADDER WITH MEDICATION WITH ASSISTANCE Description: STG Manage Bladder With Medication With mod Assistance. Outcome: Completed/Met Goal: RH STG MANAGE BLADDER WITH EQUIPMENT WITH ASSISTANCE Description: STG Manage Bladder With Equipment With supervision Assistance Outcome: Completed/Met   Problem: RH SKIN INTEGRITY Goal: RH STG SKIN FREE OF INFECTION/BREAKDOWN Description: Max assist Outcome: Completed/Met Goal: RH STG MAINTAIN SKIN INTEGRITY WITH ASSISTANCE Description: STG Maintain Skin Integrity With mas Assistance. Outcome: Completed/Met Goal: RH STG ABLE TO PERFORM INCISION/WOUND CARE W/ASSISTANCE Description: STG Able To Perform Incision/Wound Care With max Assistance. Outcome: Completed/Met   Problem: RH SAFETY Goal: RH STG ADHERE TO SAFETY PRECAUTIONS W/ASSISTANCE/DEVICE Description: STG Adhere to Safety Precautions With min Assistance/Device. Outcome: Completed/Met Goal: RH STG DECREASED RISK OF FALL WITH ASSISTANCE Description: STG Decreased Risk of Fall With min Assistance. Outcome: Completed/Met   Problem: RH COGNITION-NURSING Goal: RH STG USES MEMORY AIDS/STRATEGIES W/ASSIST TO PROBLEM SOLVE Description: STG Uses Memory Aids/Strategies With min Assistance to Problem Solve. Outcome:  Completed/Met Goal: RH STG ANTICIPATES NEEDS/CALLS FOR ASSIST W/ASSIST/CUES Description: STG Anticipates Needs/Calls for Assist With min Assistance/Cues. Outcome: Completed/Met   Problem: RH PAIN MANAGEMENT Goal: RH STG PAIN MANAGED AT OR BELOW PT'S PAIN GOAL Description: Less than 3 Outcome: Completed/Met   Problem: RH KNOWLEDGE DEFICIT Goal: RH STG INCREASE KNOWLEDGE OF HYPERTENSION Description: Patient able to describe management of hypertension with cues/handouts Outcome: Completed/Met Goal: RH STG INCREASE KNOWLEDGE OF STROKE PROPHYLAXIS Description: Patient able to describe prophylaxis to avoid stroke with cues/handout Outcome: Completed/Met

## 2019-02-02 NOTE — Progress Notes (Signed)
D/C with ambulance. Father at beside. Belongings are with patient. No signs of distress or pain noted.

## 2019-02-02 NOTE — Progress Notes (Signed)
[facility] PHYSICAL MEDICINE & REHABILITATION PROGRESS NOTE   Subjective/Complaints:  Pt reports oxybutynin was somewhat helpful for bladder spasms- "lasted until 1am" when needed cathing- has been cathed q4 hours since then (but sounds better- not pushing for caths q2)- says still feels full even when they  Cath even though volumes lower.   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     ROS: Patient denies CP, SOB, N/V/D   Objective:   No results found. Recent Labs    01/30/19 0946  WBC 4.6  HGB 14.0  HCT 41.1  PLT 190   Recent Labs    01/30/19 0946  NA 141  K 3.6  CL 103  CO2 30  GLUCOSE 102*  BUN 9  CREATININE 0.62  CALCIUM 9.0    Intake/Output Summary (Last 24 hours) at 02/02/2019 0936 Last data filed at 02/02/2019 0900 Gross per 24 hour  Intake 420 ml  Output 650 ml  Net -230 ml     Physical Exam: Vital Signs Blood pressure 126/64, pulse 77, temperature 98.6 F (37 C), temperature source Oral, resp. rate 18, height 4\' 9"  (1.448 m), weight 65 kg, SpO2 96 %. Constitutional: No distress . Vital signs reviewed. Sitting up in bed; eating breakfast; using memory strategies to remember doctor name again today; NAD HEENT: EOMI but has intermittent nystagmus esp if looks up, oral membranes moist Neck: supple; no trigger points palpated Cardiovascular: RRR without murmur.   Respiratory: CTA Bilaterally without wheezes or rales. Normal effort    GI: BS +, non-tender, non-distended  Skin: Healed incisions. Psych:bright affect;  cooperative. Musc: LE contractures Neurological: Alert Dysarthria Able to follow commands Motor: Bilateral upper extremities: 4+/5 proximal distal, without change No movement noted in bilateral lower extremities, without change- chronic due to spina bifida.   Assessment/Plan: 1. 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  Physiatrist is providing close team supervision and 24 hour management of active medical problems listed below.  Physiatrist and rehab team continue to assess barriers to discharge/monitor patient progress toward functional and medical goals  Care Tool:  Bathing    Body parts bathed by patient: Right arm, Left arm, Chest, Abdomen, Right upper leg, Left upper leg, Face, Front perineal area, Buttocks, Right lower leg, Left lower leg   Body parts bathed by helper: Right lower leg, Left lower leg Body parts n/a: Front perineal area, Buttocks(Pt declined this session)   Bathing assist Assist Level: Supervision/Verbal cueing     Upper Body Dressing/Undressing Upper body dressing   What is the patient wearing?: Pull over shirt    Upper body assist Assist Level: Independent    Lower Body Dressing/Undressing Lower body dressing      What is the patient wearing?: Pants, Incontinence brief     Lower body assist Assist for lower body dressing: Contact Guard/Touching assist     Toileting Toileting    Toileting assist Assist for toileting: Set up assist Assistive Device Comment: Set-up assist to self-cath. Incontinent of BM requiring max-total A   Transfers Chair/bed transfer  Transfers assist  Chair/bed transfer activity did not occur: Safety/medical concerns  Chair/bed transfer assist level: Contact Guard/Touching assist     Locomotion  Ambulation   Ambulation assist   Ambulation activity did not occur: Safety/medical concerns  Assist level: 2 helpers Assistive device: Walker-rolling Max distance: 357ft   Walk 10 feet activity   Assist  Walk 10 feet activity did not occur: Safety/medical concerns        Walk 50 feet  activity   Assist Walk 50 feet with 2 turns activity did not occur: Safety/medical concerns         Walk 150 feet activity   Assist Walk 150 feet activity did not occur: Safety/medical concerns         Walk 10 feet on uneven surface  activity   Assist Walk 10 feet on uneven surfaces activity did not occur: Safety/medical concerns         Wheelchair     Assist Will patient use wheelchair at discharge?: Yes Type of Wheelchair: Manual Wheelchair activity did not occur: Safety/medical concerns  Wheelchair assist level: Independent Max wheelchair distance: 150    Wheelchair 50 feet with 2 turns activity    Assist    Wheelchair 50 feet with 2 turns activity did not occur: Safety/medical concerns   Assist Level: Independent   Wheelchair 150 feet activity     Assist  Wheelchair 150 feet activity did not occur: Safety/medical concerns   Assist Level: Independent   Blood pressure 126/64, pulse 77, temperature 98.6 F (37 C), temperature source Oral, resp. rate 18, height 4\' 9"  (1.448 m), weight 65 kg, SpO2 96 %.   Medical Problem List and Plan: 1.  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 D/w SLP 2.  Antithrombotics: -DVT/anticoagulation:  Mechanical: Foot pump Bilateral lower extremities             -antiplatelet therapy: N/A due to bleed 3. Headaches/Pain Management: Added Ultram prn.   Increased Depakote ER to 750 mg daily and added norco for when tylneol and tramadol aren't effective.   Added 2 lidoderm patches 8pm to 8am daily for neck tightness, later received per patient preference  10/6- will decrease Depakote to 500 mg daily and see if OK- pt wants to reduce meds. 4. Mood: LCSW to follow for evaluation and support.              -antipsychotic agents:  N/A 5. Neuropsych: This patient is fully capable of making decisions on his own behalf. 6. Skin/Wound Care: Routine pressure relief measures.  7. Fluids/Electrolytes/Nutrition: Monitor I/Os. Tolerating current diet.  8. Headaches: Persistent but appears to be improving.  Depakote added.  See #3- reduce Depakote to 500 mg daily.  9. Leucocytosis: Resolved.    WBC 6.6 on 9/16  Continue to monitor 10. Hypokalemia: Continue daily supplement and adjust as needed.    Potassium 5.0 on 9/16,    Potassium supplement decreased    9/30- rechecking in AM  10/1- K+ 5.0- off Potassium supplement- doesn't appear to be on med that causes high K+ will recheck on Monday and then f/u.  10/6- K+ 3.6- will recheck Thursday.  11. HTN: Monitor BP tid--continue atenolol               Vitals:   02/01/19 1926 02/02/19 0329  BP: 116/70 126/64  Pulse: 81 77  Resp: 17 18  Temp: 98.5 F (36.9 C) 98.6 F (37 C)  SpO2: 98%  96%   Controlled 10/7 12. Seizure disorder: Continue Keppra 1500 mg bid.  Monitor reoccurrence.  No recent seizures, unchanged 15. 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  Overall improved with program  9/22- BM this AM outside bowel program  9/25- Suppository Mg citrate and enema--> +results  9/26--continue senokot, reassured pt that nausea should improve today  9/29- pt willing to try bowel program again.  9/30- didn't ask- will order sorbitol and suppository tonight to clean him out.  10/1- cleaned out- "filled bedpan" per pt.  10/4- BM overnight- keeps mentioning that doesn't have bowel program, but doesn't want one.  10/7- blwo out last night- will see if nursing home will do bowel program for pt- if will, will have pt do. 16. Attention/fatigue trial Provigil since doesn't raise BP as much 100 mg daily- can likely send out on Ritalin 17. Vertigo/dizziness/nausea-continue to follow   Patient sensitive  to medications and will try to avoid lethargy  Benefit with essential oils, continue  9/21- on scopolamine patch- pt questionable if helping, but told PA it was.  9/22- will add meclizine prn for dizziness- let pt know could make sleepy.  9/23- hadn't taken as of yet- reminded him to ask for it.  9/24- no improvement with Meclizine-   9/28- didn't take over weekend- since not helpful- now having associated nausea- will give Compazine prn.  9/29- doesn't want to try Baclofen- will con't to offer  10/1- will d/c Meclizine since not helpful  10/2- will con't scopolamine  For now- pt doesn't want to d/c.  18. R hip dysplasia- was due to go to [facility] for surgery-on hold at present 19.  Post stroke dysphagia:   Advanced to regular texture, thins on 9/11 20.  Neurogenic bladder  Discussed with nursing, change bladder scans to every 4 hours  9/23- bladder urgency- asking for caths very frequently-encourage q4 hours   9/24- bladder accident yesterday-  Cath q4  10/2- still cathing q4 hours  10/7- leaking in between caths every 2-4 hours- will check with Urology if pt could need oxybytynin- if so, will then start if needed 21. Cold/congestion  9/23- added Sudafed 3 mg q6 hours prn.  9/24- improved Sx's 22. Dispo- will need f/u with Dr Berline Chough in next 1-2 weeks as well as his Urologist- Dr Gershon Mussel.   LOS: 36 days A FACE TO FACE EVALUATION WAS PERFORMED  Kiyona Mcnall 02/02/2019, 9:36 AM

## 2019-02-02 NOTE — Progress Notes (Signed)
Social Work Discharge Note   The overall goal for the admission was met for:   Discharge location: No - plan changed to SNF  Length of Stay: No - extended LOS due to SNF bed search.  LOS = 36 days  Discharge activity level: Yes - minimal assistance overall  Home/community participation: Yes  Services provided included: MD, RD, PT, OT, SLP, RN, TR, Pharmacy and SW  Financial Services: Medicare  Follow-up services arranged: Other: SNF @ [facility])  Comments (or additional information):   contact info:: father, Bazil Dhanani @ [phone]  Patient/Family verbalized understanding of follow-up arrangements: Yes  Individual responsible for coordination of the follow-up plan: pt  Confirmed correct DME delivered:  NA   Dannis Deroche

## 2019-02-02 NOTE — Progress Notes (Signed)
Physical Therapy Discharge Summary  Patient Details  Name: Carlos Ferguson MRN: [id number] Date of Birth: 07-02-1965  Today's Date: 02/02/2019   Patient has met 6 of 6 long term goals due to improved activity tolerance, improved balance, improved postural control, increased strength and ability to compensate for deficits.  Patient to discharge at a wheelchair level mod I once up in w/c but Supervision to [facility] for transfers.   Patient's care partner unavailable to provide the necessary physical and cognitive assistance at discharge, therefore pt is d/c to a SNF to continue to progress towards a level at which he can return home.  Reasons goals not met: Patient has met all rehab goals.  Recommendation:  Patient will benefit from ongoing skilled PT services in skilled nursing facility setting to continue to advance safe functional mobility, address ongoing impairments in endurance, strength, balance, safety, independence with functional mobility, and minimize fall risk.  Equipment: No equipment provided. Equipment TBD by accepting facility.  Reasons for discharge: treatment goals met and discharge from hospital  Patient/family agrees with progress made and goals achieved: Yes  PT Discharge Precautions/Restrictions Precautions Precautions: Fall Precaution Comments: nystagmus and dizziness Restrictions Weight Bearing Restrictions: No Vision/Perception  Vision - Assessment Alignment/Gaze Preference: Head turned Tracking/Visual Pursuits: Decreased smoothness of horizontal tracking Perception Perception: Within Functional Limits Praxis Praxis: Intact  Cognition Overall Cognitive Status: Impaired/Different from baseline Arousal/Alertness: Awake/alert Orientation Level: Oriented X4 Attention: Selective Sustained Attention: Impaired Selective Attention: Impaired Memory: Impaired Memory Impairment: Decreased short term memory;Decreased recall of new information Awareness:  Impaired Awareness Impairment: Emergent impairment Problem Solving: Impaired Safety/Judgment: Appears intact Sensation Sensation Light Touch: Impaired Detail Light Touch Impaired Details: Impaired LLE;Impaired RLE Proprioception: Impaired Detail Proprioception Impaired Details: Absent RLE;Absent LLE Coordination Gross Motor Movements are Fluid and Coordinated: No Fine Motor Movements are Fluid and Coordinated: Yes Coordination and Movement Description: Impaired 2/2 spina bifida and generalized weakness/deconditioning Motor  Motor Motor: Other (comment) Motor - Skilled Clinical Observations: impaired 2/2 spina bifida Motor - Discharge Observations: impaired 2/2 spina bifida  Mobility Bed Mobility Bed Mobility: Rolling Right;Rolling Left;Supine to Sit;Sit to Supine Rolling Right: Independent with assistive device Rolling Left: Independent with assistive device Supine to Sit: Independent with assistive device Sit to Supine: Independent with assistive device Transfers Transfers: Lateral/Scoot Transfers Lateral/Scoot Transfers: Contact Guard/Touching assist Transfer (Assistive device): Other (Comment)(slide board) Locomotion  Gait Ambulation: Yes Gait Assistance: 2 Helpers Assistive device: Rolling walker Gait Assistance Details: Verbal cues for sequencing;Verbal cues for technique;Verbal cues for gait pattern;Verbal cues for safe use of DME/AE;Manual facilitation for weight shifting;Manual facilitation for placement Gait Gait: Yes Gait Pattern: Impaired Gait Pattern: Step-to pattern;Decreased step length - right;Decreased stride length;Decreased hip/knee flexion - right;Decreased hip/knee flexion - left;Decreased dorsiflexion - right;Decreased dorsiflexion - left;Decreased weight shift to left;Right circumduction;Left circumduction Gait velocity: decreased Stairs / Additional Locomotion Stairs: No Architect: Yes Wheelchair Assistance: Independent  with Camera operator: Both upper extremities Wheelchair Parts Management: Needs assistance  Trunk/Postural Assessment  Cervical Assessment Cervical Assessment: Exceptions to WFL(forward head) Thoracic Assessment Thoracic Assessment: Exceptions to WFL(kyphotic; rounded shoulders) Lumbar Assessment Lumbar Assessment: Exceptions to WFL(posterior pelvic tilt) Postural Control Postural Control: Deficits on evaluation Righting Reactions: Insufficient  Balance Balance Balance Assessed: Yes Static Sitting Balance Static Sitting - Balance Support: No upper extremity supported;Feet supported Static Sitting - Level of Assistance: 6: Modified independent (Device/Increase time) Dynamic Sitting Balance Dynamic Sitting - Balance Support: During functional activity;No upper extremity supported;Feet supported Dynamic Sitting - Level of Assistance:  5: Stand by assistance Static Standing Balance Static Standing - Balance Support: Bilateral upper extremity supported;During functional activity Static Standing - Level of Assistance: 4: Min assist Dynamic Standing Balance Dynamic Standing - Balance Support: Bilateral upper extremity supported;During functional activity Dynamic Standing - Level of Assistance: 3: Mod assist Extremity Assessment   RLE Assessment RLE Assessment: Exceptions to [facility] Passive Range of Motion (PROM) Comments: tight hips, HS, ankle DF Active Range of Motion (AROM) Comments: impaired 2/2 weakness and spina bifida General Strength Comments: impaired, at least 2/5 LLE Assessment LLE Assessment: Exceptions to [name] Passive Range of Motion (PROM) Comments: tight hips, HS, ankle DF General Strength Comments: impaired 2/2 weakness and spina bifida     Excell Seltzer, PT, DPT 02/02/2019, 12:05 PM

## 2019-02-03 ENCOUNTER — Encounter (HOSPITAL_COMMUNITY): Payer: Self-pay | Admitting: Physical Medicine and Rehabilitation

## 2019-02-03 ENCOUNTER — Telehealth (HOSPITAL_COMMUNITY): Payer: Self-pay | Admitting: Physical Medicine and Rehabilitation

## 2019-02-03 LAB — URINE CULTURE: Culture: >=100000 — AB

## 2019-02-03 NOTE — Telephone Encounter (Signed)
Contacted Carmie End at [facility] regarding [employer]. Septra (R) to citrobacter and recommended changing antibiotic to Cipro or Macrobid. I also faxed results to them

## 2019-02-06 NOTE — Telephone Encounter (Signed)
See note re: [facility] already in chart

## 2019-03-06 ENCOUNTER — Encounter
Payer: Medicare Other | Attending: Physical Medicine and Rehabilitation | Admitting: Physical Medicine and Rehabilitation

## 2019-09-28 ENCOUNTER — Emergency Department (HOSPITAL_COMMUNITY)
Admission: EM | Admit: 2019-09-28 | Discharge: 2019-09-28 | Disposition: A | Discharge status: Home / Self Care | Payer: Medicare Other | Attending: Emergency Medicine | Admitting: Emergency Medicine

## 2019-09-28 ENCOUNTER — Encounter (HOSPITAL_COMMUNITY): Payer: Self-pay | Admitting: Emergency Medicine

## 2019-09-28 ENCOUNTER — Emergency Department (HOSPITAL_COMMUNITY): Payer: Medicare Other

## 2019-09-28 ENCOUNTER — Other Ambulatory Visit: Payer: Self-pay

## 2019-09-28 DIAGNOSIS — Z79899 Other long term (current) drug therapy: Secondary | ICD-10-CM | POA: Insufficient documentation

## 2019-09-28 DIAGNOSIS — Z91040 Latex allergy status: Secondary | ICD-10-CM | POA: Insufficient documentation

## 2019-09-28 DIAGNOSIS — R10819 Abdominal tenderness, unspecified site: Secondary | ICD-10-CM | POA: Insufficient documentation

## 2019-09-28 DIAGNOSIS — R197 Diarrhea, unspecified: Secondary | ICD-10-CM | POA: Insufficient documentation

## 2019-09-28 DIAGNOSIS — I10 Essential (primary) hypertension: Secondary | ICD-10-CM | POA: Diagnosis not present

## 2019-09-28 DIAGNOSIS — Z20822 Contact with and (suspected) exposure to covid-19: Secondary | ICD-10-CM | POA: Diagnosis not present

## 2019-09-28 LAB — COMPREHENSIVE METABOLIC PANEL
ALT: 63 U/L — ABNORMAL HIGH (ref 0–44)
AST: 35 U/L (ref 15–41)
Albumin: 3.2 g/dL — ABNORMAL LOW (ref 3.5–5.0)
Alkaline Phosphatase: 104 U/L (ref 38–126)
Anion gap: 13 (ref 5–15)
BUN: 12 mg/dL (ref 6–20)
CO2: 21 mmol/L — ABNORMAL LOW (ref 22–32)
Calcium: 8.6 mg/dL — ABNORMAL LOW (ref 8.9–10.3)
Chloride: 101 mmol/L (ref 98–111)
Creatinine, Ser: 0.95 mg/dL (ref 0.61–1.24)
GFR calc Af Amer: >60 mL/min (ref >60)
GFR calc non Af Amer: >60 mL/min (ref >60)
Glucose, Bld: 139 mg/dL — ABNORMAL HIGH (ref 70–99)
Potassium: 3.8 mmol/L (ref 3.5–5.1)
Sodium: 135 mmol/L (ref 135–145)
Total Bilirubin: 0.6 mg/dL (ref 0.3–1.2)
Total Protein: 7.3 g/dL (ref 6.5–8.1)

## 2019-09-28 LAB — CBC
HCT: 37.1 % — ABNORMAL LOW (ref 39.0–52.0)
Hemoglobin: 12.3 g/dL — ABNORMAL LOW (ref 13.0–17.0)
MCH: 30.8 pg (ref 26.0–34.0)
MCHC: 33.2 g/dL (ref 30.0–36.0)
MCV: 93 fL (ref 80.0–100.0)
Platelets: 311 10*3/uL (ref 150–400)
RBC: 3.99 MIL/uL — ABNORMAL LOW (ref 4.22–5.81)
RDW: 13.9 % (ref 11.5–15.5)
WBC: 10.5 10*3/uL (ref 4.0–10.5)
nRBC: 0 % (ref 0.0–0.2)

## 2019-09-28 LAB — SARS CORONAVIRUS 2 BY RT PCR (HOSPITAL ORDER, PERFORMED IN ~~LOC~~ HOSPITAL LAB): SARS Coronavirus 2: NEGATIVE

## 2019-09-28 LAB — LIPASE, BLOOD: Lipase: 34 U/L (ref 11–51)

## 2019-09-28 MED ORDER — SODIUM CHLORIDE 0.9 % IV BOLUS
1000.0000 mL | Freq: Once | INTRAVENOUS | Status: AC
  Administered 2019-09-28: 1000 mL via INTRAVENOUS

## 2019-09-28 MED ORDER — METRONIDAZOLE 500 MG PO TABS
500.0000 mg | ORAL_TABLET | Freq: Two times a day (BID) | ORAL | 0 refills | Status: DC
Start: 2019-09-28 — End: 2019-11-08

## 2019-09-28 MED ORDER — HYDROCORTISONE 1 % EX CREA
TOPICAL_CREAM | CUTANEOUS | 1 refills | Status: DC
Start: 2019-09-28 — End: 2019-11-08

## 2019-09-28 MED ORDER — IOHEXOL 300 MG/ML  SOLN
100.0000 mL | Freq: Once | INTRAMUSCULAR | Status: AC | PRN
  Administered 2019-09-28: 100 mL via INTRAVENOUS

## 2019-09-28 MED ORDER — SODIUM CHLORIDE 0.9% FLUSH: 3.0000 mL | Freq: Once | INTRAVENOUS | Status: DC

## 2019-09-28 MED ORDER — ACETAMINOPHEN 325 MG PO TABS
650.0000 mg | ORAL_TABLET | Freq: Once | ORAL | Status: AC
  Administered 2019-09-28: 650 mg via ORAL

## 2019-09-28 NOTE — ED Provider Notes (Signed)
[facility] Provider Note   CSN: [id number] Arrival date & time: 09/28/19  1617     History Chief Complaint  Patient presents with  . Diarrhea    Carlos Ferguson is a 54 y.o. male.  Patient states that he has been taking Macrobid and has developed diarrhea.  He took [place] for urinary tract infection.  Patient not having any pain  The history is provided by the patient. No language interpreter was used.  Diarrhea Quality:  Semi-solid Severity:  Moderate Onset quality:  Sudden Timing:  Constant Progression:  Worsening Relieved by:  Nothing Worsened by:  Nothing Ineffective treatments:  None tried Associated symptoms: abdominal pain   Associated symptoms: no headaches   Risk factors: recent antibiotic use        Past Medical History:  Diagnosis Date  . Cellulitis of left knee 2014  . Flaccid neurogenic bladder    self caths every 4 hours  . Frequent UTI   . Hydrocephalus ([place]) 2014  . ICH (intracerebral hemorrhage) ([place]) 2014   left frontal ICH  . Seizures ([place])   . Self-catheterizes urinary bladder   . Sepsis ([place]) 2014   due to knee infection   . Spina bifida ([place])   . 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     Patient Active Problem List   Diagnosis Date Noted  . Frequent UTI 02/01/2019  . Vertigo   . Frequent headaches 01/11/2019  . Dysphagia, post-stroke   . 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 Neurogenic bowel   . Neurogenic bladder, flaccid   . Essential hypertension 12/28/2018  . Obesity 12/28/2018  . Spina bifida ([place]) 12/28/2018  . Urinary retention 12/28/2018  . Seizures ([place]) 12/28/2018  . Hypokalemia 12/28/2018  . Intracranial hemorrhage, cerebellar ([place]) 12/28/2018  . Cerebellar bleed ([place])   . Leukocytosis   . 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 ICH (intracerebral hemorrhage) ([facility]) L cerebellar, etiology ? HTN 12/25/2018    Past Surgical  History:  Procedure Laterality Date  . CSF SHUNT  1969  . LLE surgery     Multiple surgeries for correction.   Marland Kitchen RLE surgery     surgery for correction        Family History  Problem Relation Age of Onset  . Stroke Mother        light  . Stroke Father   . CAD Father   . Heart murmur Sister     Social History   Tobacco Use  . Smoking status: Never Smoker  . Smokeless tobacco: Never Used  Substance Use Topics  . Alcohol use: Not Currently  . Drug use: Never    Home Medications Prior to Admission medications   Medication Sig Start Date End Date Taking? Authorizing Provider  acetaminophen (TYLENOL) 500 MG tablet Take 1,000 mg by mouth every 6 (six) hours as needed for mild pain.    [provider]  atenolol (TENORMIN) 25 MG tablet Take 3 tablets (75 mg total) by mouth daily. 02/02/19   Love, Ivan Anchors, PA-C  divalproex (DEPAKOTE ER) 500 MG 24 hr tablet Take 1 tablet (500 mg total) by mouth daily. 02/02/19   Love, Ivan Anchors, PA-C  flavoxATE (URISPAS) 100 MG tablet Take 1 tablet (100 mg total) by mouth 3 (three) times daily as needed for bladder spasms. 02/01/19   Love, Ivan Anchors, PA-C  hydrocortisone cream 1 % Apply to affected area 2  times daily 09/28/19   Bethann Berkshire, MD  levETIRAcetam (KEPPRA) 500 MG tablet Take 1,500 mg by mouth 2 (two) times daily.    [provider]  liver oil-zinc oxide (DESITIN) 40 % ointment Apply topically 3 (three) times daily. 02/02/19   Love, Evlyn Kanner, PA-C  metroNIDAZOLE (FLAGYL) 500 MG tablet Take 1 tablet (500 mg total) by mouth 2 (two) times daily. One po bid x 7 days 09/28/19   Bethann Berkshire, MD  modafinil (PROVIGIL) 100 MG tablet Take 1 tablet (100 mg total) by mouth daily. 02/02/19   Love, Evlyn Kanner, PA-C  nystatin (MYCOSTATIN/NYSTOP) powder Apply topically 2 (two) times daily. 12/28/18   Layne Benton, NP  oxybutynin (DITROPAN) 5 MG tablet Take 1 tablet (5 mg total) by mouth 3 (three) times daily. 02/02/19   Love, Evlyn Kanner, PA-C    pseudoephedrine (SUDAFED) 30 MG tablet Take 1 tablet (30 mg total) by mouth every 6 (six) hours as needed for congestion. 02/01/19   Love, Evlyn Kanner, PA-C  scopolamine (TRANSDERM-SCOP) 1 MG/3DAYS Place 1 patch (1.5 mg total) onto the skin every 3 (three) days. 02/02/19   Love, Evlyn Kanner, PA-C  Ferguson (SENOKOT) 8.6 MG TABS tablet Take 1 tablet (8.6 mg total) by mouth daily. 02/02/19   Love, Evlyn Kanner, PA-C  sulfamethoxazole-trimethoprim (BACTRIM DS) 800-160 MG tablet Take 1 tablet by mouth every 12 (twelve) hours. 02/02/19   Love, Evlyn Kanner, PA-C    Allergies    Latex and Penicillins  Review of Systems   Review of Systems  Constitutional: Negative for appetite change and fatigue.  HENT: Negative for congestion, ear discharge and sinus pressure.   Eyes: Negative for discharge.  Respiratory: Negative for cough.   Cardiovascular: Negative for chest pain.  Gastrointestinal: Positive for abdominal pain and diarrhea.  Genitourinary: Negative for frequency and hematuria.  Musculoskeletal: Negative for back pain.  Skin: Negative for rash.  Neurological: Negative for seizures and headaches.  Psychiatric/Behavioral: Negative for hallucinations.    Physical Exam Updated Vital Signs BP (!) 145/80 (BP Location: Right Arm)   Pulse 98   Temp 98.4 F (36.9 C) (Oral)   Resp 20   SpO2 100%   Physical Exam Vitals and nursing note reviewed.  Constitutional:      Appearance: He is well-developed.  HENT:     Head: Normocephalic.     Nose: Nose normal.  Eyes:     General: No scleral icterus.    Conjunctiva/sclera: Conjunctivae normal.  Neck:     Thyroid: No thyromegaly.  Cardiovascular:     Rate and Rhythm: Normal rate and regular rhythm.     Heart sounds: No murmur. No friction rub. No gallop.   Pulmonary:     Breath sounds: No stridor. No wheezing or rales.  Chest:     Chest wall: No tenderness.  Abdominal:     General: There is no distension.     Tenderness: There is abdominal tenderness.  There is guarding. There is no rebound.  Musculoskeletal:        General: Normal range of motion.     Cervical back: Neck supple.  Lymphadenopathy:     Cervical: No cervical adenopathy.  Skin:    Findings: No erythema or rash.  Neurological:     Mental Status: He is alert and oriented to person, place, and time.     Motor: No abnormal muscle tone.     Coordination: Coordination normal.  Psychiatric:        Behavior:  Behavior normal.     ED Results / Procedures / Treatments   Labs (all labs ordered are listed, but only abnormal results are displayed) Labs Reviewed  COMPREHENSIVE METABOLIC PANEL - Abnormal; Notable for the following components:      Result Value   CO2 21 (*)    Glucose, Bld 139 (*)    Calcium 8.6 (*)    Albumin 3.2 (*)    ALT 63 (*)    All other components within normal limits  CBC - Abnormal; Notable for the following components:   RBC 3.99 (*)    Hemoglobin 12.3 (*)    HCT 37.1 (*)    All other components within normal limits  SARS CORONAVIRUS 2 BY RT PCR (HOSPITAL ORDER, PERFORMED IN [facility])  C DIFFICILE QUICK SCREEN W PCR REFLEX  GASTROINTESTINAL PANEL BY PCR, STOOL (REPLACES STOOL CULTURE)  LIPASE, BLOOD  URINALYSIS, ROUTINE W REFLEX MICROSCOPIC    EKG None  Radiology CT ABDOMEN PELVIS W CONTRAST  Result Date: 09/28/2019 CLINICAL DATA:  Generalized abdominal pain and diarrhea for 2 weeks EXAM: CT ABDOMEN AND PELVIS WITH CONTRAST TECHNIQUE: Multidetector CT imaging of the abdomen and pelvis was performed using the standard protocol following bolus administration of intravenous contrast. CONTRAST:  OMNIPAQUE IOHEXOL 300 MG/ML  SOLN COMPARISON:  12/02/2016 FINDINGS: Lower chest: No acute abnormality. Hepatobiliary: Fatty infiltration of the liver is noted. The gallbladder is decompressed with multiple gallstones within. Pancreas: Unremarkable. No pancreatic ductal dilatation or surrounding inflammatory changes. Spleen: Normal in  size without focal abnormality. Adrenals/Urinary Tract: Adrenal glands are unremarkable. Kidneys demonstrate a normal enhancement pattern bilaterally. Normal excretion of contrast is seen. No renal calculi or obstructive changes are noted. The bladder is well distended. Stomach/Bowel: Colon demonstrates scattered diverticular change without evidence of diverticulitis. The appendix is within normal limits. Small bowel shows no obstructive changes. Stomach is within normal limits. Vascular/Lymphatic: Aortic atherosclerosis. No enlarged abdominal or pelvic lymph nodes. Reproductive: Prostate is unremarkable. Other: Shunt catheter fragment is noted in the abdomen but is not contiguous with the chest portion. This is stable from the prior exam. No ascites is noted. No herniation is seen. Musculoskeletal: Changes of spinal dysraphism with prominence of the CSF space and lumbosacral junction. No acute bony abnormality is noted. Postsurgical changes in the proximal right femur are again noted. IMPRESSION: Chronic changes as described above.  No acute abnormality is noted. Electronically Signed   By: Alcide Clever M.D.   On: 09/28/2019 22:35    Procedures Procedures (including critical care time)  Medications Ordered in ED Medications  sodium chloride flush (NS) 0.9 % injection 3 mL (3 mLs Intravenous Not Given 09/28/19 2134)  acetaminophen (TYLENOL) tablet 650 mg (650 mg Oral Given 09/28/19 1632)  sodium chloride 0.9 % bolus 1,000 mL (1,000 mLs Intravenous New Bag/Given 09/28/19 2156)  iohexol (OMNIPAQUE) 300 MG/ML solution 100 mL (100 mLs Intravenous Contrast Given 09/28/19 2214)    ED Course  I have reviewed the triage vital signs and the nursing notes.  Pertinent labs & imaging results that were available during my care of the patient were reviewed by me and considered in my medical decision making (see chart for details).    MDM Rules/Calculators/A&P                      Patient with dehydration and  diarrhea after antibiotics.  Labs unremarkable.  Patient will be started on Flagyl and follow-up with his PCP  This patient presents to the ED for concern of diarrhea this involves an extensive number of treatment options, and is a complaint that carries with it a high risk of complications and morbidity.  The differential diagnosis includes gastroenteritis embolic disease diarrhea   Lab Tests:   I Ordered, reviewed, and interpreted labs, which included CBC and chemistries which showed mild anemia  Medicines ordered:   I ordered medication normal saline for dehydration  Imaging Studies ordered:   I ordered imaging studies which included CT of the abdomen and  I independently visualized and interpreted imaging which showed no acute disease  Additional history obtained:   Additional history obtained from records  Previous records obtained and reviewed   Consultations Obtained:   Reevaluation:  After the interventions stated above, I reevaluated the patient and found moderately improved  Critical Interventions:  .   Final Clinical Impression(s) / ED Diagnoses Final diagnoses:  Diarrhea, unspecified type    Rx / DC Orders ED Discharge Orders         Ordered    metroNIDAZOLE (FLAGYL) 500 MG tablet  2 times daily     09/28/19 2301    hydrocortisone cream 1 %     09/28/19 2301           Bethann Berkshire, MD 10/02/19 1028

## 2019-09-28 NOTE — ED Triage Notes (Signed)
Pt c/o of diarrhea x 2 weeks.

## 2019-09-28 NOTE — Discharge Instructions (Addendum)
Drink plenty of fluids and follow-up with your family doctor next week. °

## 2019-09-28 NOTE — ED Notes (Signed)
Pt incontinent of stool. Pt cleaned and assisted to bed.

## 2019-10-19 ENCOUNTER — Other Ambulatory Visit: Payer: Self-pay

## 2019-10-19 ENCOUNTER — Emergency Department (HOSPITAL_COMMUNITY): Payer: Medicare Other

## 2019-10-19 ENCOUNTER — Encounter (HOSPITAL_COMMUNITY): Payer: Self-pay | Admitting: *Deleted

## 2019-10-19 ENCOUNTER — Inpatient Hospital Stay (HOSPITAL_COMMUNITY)
Admission: EM | Admit: 2019-10-19 | Discharge: 2019-11-08 | DRG: 392 | Disposition: A | Discharge status: Skilled Nursing Facility | Payer: Medicare Other | Attending: Internal Medicine | Admitting: Internal Medicine

## 2019-10-19 DIAGNOSIS — K5900 Constipation, unspecified: Secondary | ICD-10-CM | POA: Diagnosis present

## 2019-10-19 DIAGNOSIS — K644 Residual hemorrhoidal skin tags: Secondary | ICD-10-CM | POA: Diagnosis present

## 2019-10-19 DIAGNOSIS — S81002A Unspecified open wound, left knee, initial encounter: Secondary | ICD-10-CM | POA: Diagnosis present

## 2019-10-19 DIAGNOSIS — R609 Edema, unspecified: Secondary | ICD-10-CM | POA: Diagnosis present

## 2019-10-19 DIAGNOSIS — K529 Noninfective gastroenteritis and colitis, unspecified: Secondary | ICD-10-CM | POA: Diagnosis not present

## 2019-10-19 DIAGNOSIS — Z823 Family history of stroke: Secondary | ICD-10-CM

## 2019-10-19 DIAGNOSIS — R197 Diarrhea, unspecified: Secondary | ICD-10-CM

## 2019-10-19 DIAGNOSIS — Z87440 Personal history of urinary (tract) infections: Secondary | ICD-10-CM

## 2019-10-19 DIAGNOSIS — Z20822 Contact with and (suspected) exposure to covid-19: Secondary | ICD-10-CM | POA: Diagnosis present

## 2019-10-19 DIAGNOSIS — Z79899 Other long term (current) drug therapy: Secondary | ICD-10-CM

## 2019-10-19 DIAGNOSIS — Z880 Allergy status to penicillin: Secondary | ICD-10-CM

## 2019-10-19 DIAGNOSIS — K047 Periapical abscess without sinus: Secondary | ICD-10-CM

## 2019-10-19 DIAGNOSIS — K760 Fatty (change of) liver, not elsewhere classified: Secondary | ICD-10-CM | POA: Diagnosis present

## 2019-10-19 DIAGNOSIS — Q0703 Arnold-Chiari syndrome with spina bifida and hydrocephalus: Secondary | ICD-10-CM

## 2019-10-19 DIAGNOSIS — N312 Flaccid neuropathic bladder, not elsewhere classified: Secondary | ICD-10-CM | POA: Diagnosis present

## 2019-10-19 DIAGNOSIS — G40909 Epilepsy, unspecified, not intractable, without status epilepticus: Secondary | ICD-10-CM

## 2019-10-19 DIAGNOSIS — G9389 Other specified disorders of brain: Secondary | ICD-10-CM

## 2019-10-19 DIAGNOSIS — Z8249 Family history of ischemic heart disease and other diseases of the circulatory system: Secondary | ICD-10-CM

## 2019-10-19 DIAGNOSIS — N393 Stress incontinence (female) (male): Secondary | ICD-10-CM | POA: Diagnosis present

## 2019-10-19 DIAGNOSIS — I69398 Other sequelae of cerebral infarction: Secondary | ICD-10-CM

## 2019-10-19 DIAGNOSIS — R6510 Systemic inflammatory response syndrome (SIRS) of non-infectious origin without acute organ dysfunction: Secondary | ICD-10-CM | POA: Diagnosis present

## 2019-10-19 DIAGNOSIS — K209 Esophagitis, unspecified without bleeding: Secondary | ICD-10-CM | POA: Diagnosis present

## 2019-10-19 DIAGNOSIS — Z91040 Latex allergy status: Secondary | ICD-10-CM

## 2019-10-19 DIAGNOSIS — I10 Essential (primary) hypertension: Secondary | ICD-10-CM | POA: Diagnosis present

## 2019-10-19 DIAGNOSIS — L89159 Pressure ulcer of sacral region, unspecified stage: Secondary | ICD-10-CM

## 2019-10-19 DIAGNOSIS — Q438 Other specified congenital malformations of intestine: Secondary | ICD-10-CM

## 2019-10-19 DIAGNOSIS — K635 Polyp of colon: Secondary | ICD-10-CM

## 2019-10-19 DIAGNOSIS — L03317 Cellulitis of buttock: Secondary | ICD-10-CM | POA: Diagnosis present

## 2019-10-19 DIAGNOSIS — S31809A Unspecified open wound of unspecified buttock, initial encounter: Secondary | ICD-10-CM | POA: Diagnosis present

## 2019-10-19 LAB — COMPREHENSIVE METABOLIC PANEL
ALT: 53 U/L — ABNORMAL HIGH (ref 0–44)
AST: 28 U/L (ref 15–41)
Albumin: 3 g/dL — ABNORMAL LOW (ref 3.5–5.0)
Alkaline Phosphatase: 106 U/L (ref 38–126)
Anion gap: 9 (ref 5–15)
BUN: 17 mg/dL (ref 6–20)
CO2: 25 mmol/L (ref 22–32)
Calcium: 8.9 mg/dL (ref 8.9–10.3)
Chloride: 102 mmol/L (ref 98–111)
Creatinine, Ser: 0.94 mg/dL (ref 0.61–1.24)
GFR calc Af Amer: >60 mL/min (ref >60)
GFR calc non Af Amer: >60 mL/min (ref >60)
Glucose, Bld: 111 mg/dL — ABNORMAL HIGH (ref 70–99)
Potassium: 4.4 mmol/L (ref 3.5–5.1)
Sodium: 136 mmol/L (ref 135–145)
Total Bilirubin: 0.5 mg/dL (ref 0.3–1.2)
Total Protein: 7.4 g/dL (ref 6.5–8.1)

## 2019-10-19 LAB — CBC WITH DIFFERENTIAL/PLATELET
Abs Immature Granulocytes: 0.06 10*3/uL (ref 0.00–0.07)
Basophils Absolute: 0 10*3/uL (ref 0.0–0.1)
Basophils Relative: 0 %
Eosinophils Absolute: 0 10*3/uL (ref 0.0–0.5)
Eosinophils Relative: 0 %
HCT: 36.5 % — ABNORMAL LOW (ref 39.0–52.0)
Hemoglobin: 11.5 g/dL — ABNORMAL LOW (ref 13.0–17.0)
Immature Granulocytes: 0 %
Lymphocytes Relative: 7 %
Lymphs Abs: 1 10*3/uL (ref 0.7–4.0)
MCH: 29.5 pg (ref 26.0–34.0)
MCHC: 31.5 g/dL (ref 30.0–36.0)
MCV: 93.6 fL (ref 80.0–100.0)
Monocytes Absolute: 1.1 10*3/uL — ABNORMAL HIGH (ref 0.1–1.0)
Monocytes Relative: 8 %
Neutro Abs: 11.8 10*3/uL — ABNORMAL HIGH (ref 1.7–7.7)
Neutrophils Relative %: 85 %
Platelets: 369 10*3/uL (ref 150–400)
RBC: 3.9 MIL/uL — ABNORMAL LOW (ref 4.22–5.81)
RDW: 14.3 % (ref 11.5–15.5)
WBC: 13.9 10*3/uL — ABNORMAL HIGH (ref 4.0–10.5)
nRBC: 0 % (ref 0.0–0.2)

## 2019-10-19 LAB — URINALYSIS, ROUTINE W REFLEX MICROSCOPIC
Bilirubin Urine: NEGATIVE
Glucose, UA: NEGATIVE mg/dL
Hgb urine dipstick: NEGATIVE
Ketones, ur: NEGATIVE mg/dL
Leukocytes,Ua: NEGATIVE
Nitrite: NEGATIVE
Protein, ur: NEGATIVE mg/dL
Specific Gravity, Urine: 1.026 (ref 1.005–1.030)
pH: 5 (ref 5.0–8.0)

## 2019-10-19 LAB — C DIFFICILE QUICK SCREEN W PCR REFLEX
C Diff antigen: NEGATIVE
C Diff interpretation: NOT DETECTED
C Diff toxin: NEGATIVE

## 2019-10-19 LAB — LACTIC ACID, PLASMA
Lactic Acid, Venous: 1.2 mmol/L (ref 0.5–1.9)
Lactic Acid, Venous: 1 mmol/L (ref 0.5–1.9)

## 2019-10-19 MED ORDER — SODIUM CHLORIDE 0.9% FLUSH
3.0000 mL | Freq: Once | INTRAVENOUS | Status: AC
  Administered 2019-10-19: 3 mL via INTRAVENOUS

## 2019-10-19 MED ORDER — METRONIDAZOLE IN NACL 5-0.79 MG/ML-% IV SOLN
500.0000 mg | Freq: Once | INTRAVENOUS | Status: AC
  Administered 2019-10-19: 500 mg via INTRAVENOUS
  Filled 2019-10-19: qty 100

## 2019-10-19 MED ORDER — SODIUM CHLORIDE 0.9 % IV BOLUS
1000.0000 mL | Freq: Once | INTRAVENOUS | Status: AC
  Administered 2019-10-19: 1000 mL via INTRAVENOUS

## 2019-10-19 MED ORDER — SODIUM CHLORIDE 0.9 % IV SOLN
1.0000 g | Freq: Once | INTRAVENOUS | Status: AC
  Administered 2019-10-19: 1 g via INTRAVENOUS
  Filled 2019-10-19: qty 10

## 2019-10-19 MED ORDER — IOHEXOL 300 MG/ML  SOLN
100.0000 mL | Freq: Once | INTRAMUSCULAR | Status: AC | PRN
  Administered 2019-10-19: 100 mL via INTRAVENOUS

## 2019-10-19 MED ORDER — ACETAMINOPHEN 325 MG PO TABS
650.0000 mg | ORAL_TABLET | Freq: Once | ORAL | Status: AC | PRN
  Administered 2019-10-19: 650 mg via ORAL
  Filled 2019-10-19: qty 2

## 2019-10-19 NOTE — ED Provider Notes (Signed)
[facility] EMERGENCY DEPARTMENT Provider Note   CSN: [id number] Arrival date & time: 10/19/19  1651     History Chief Complaint  Patient presents with  . Diarrhea    Carlos Ferguson is a 54 y.o. male.  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  He said he has had diarrhea for 5 weeks.  Multiple courses of antibiotics for recurring UTIs.  No improvement of the diarrhea with any antidiarrheal medicine like Imodium.  Low-grade fevers on and off.  States he is moving his bowels 10-15 times a day now is having skin breakdown over his buttocks.  Ambulates with crutches.  Also complaining of a nonproductive cough on and off.  No known fevers but has felt hot and cold.  The history is provided by the patient.  Diarrhea Quality:  Watery Severity:  Severe Onset quality:  Gradual Number of episodes:  10-20 per day Timing:  Intermittent Progression:  Unchanged Relieved by:  Nothing Worsened by:  Nothing Ineffective treatments:  Anti-motility medications Associated symptoms: abdominal pain, chills, cough, fever and headaches   Associated symptoms: no vomiting   Risk factors: recent antibiotic use        Past Medical History:  Diagnosis Date  . Cellulitis of left knee 2014  . Flaccid neurogenic bladder    self caths every 4 hours  . Frequent UTI   . Hydrocephalus (HCC) 2014  . ICH (intracerebral hemorrhage) (HCC) 2014   left frontal ICH  . Seizures (HCC)   . Self-catheterizes urinary bladder   . Sepsis (HCC) 2014   due to knee infection   . Spina bifida (HCC)   . 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     Patient Active Problem List   Diagnosis Date Noted  . Frequent UTI 02/01/2019  . Vertigo   . Frequent headaches 01/11/2019  . Dysphagia, post-stroke   . 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 Neurogenic bowel   . Neurogenic  bladder, flaccid   . Essential hypertension 12/28/2018  . Obesity 12/28/2018  . Spina bifida (HCC) 12/28/2018  . Urinary retention 12/28/2018  . Seizures (HCC) 12/28/2018  . Hypokalemia 12/28/2018  . Intracranial hemorrhage, cerebellar (HCC) 12/28/2018  . Cerebellar bleed (HCC)   . Leukocytosis   . Cytotoxic brain edema (HCC) 12/27/2018  . ICH (intracerebral hemorrhage) (HCC) L cerebellar, etiology ? HTN 12/25/2018    Past Surgical History:  Procedure Laterality Date  . CSF SHUNT  1969  . LLE surgery     Multiple surgeries for correction.   Marland Kitchen RLE surgery     surgery for correction        Family History  Problem Relation Age of Onset  . Stroke Mother        light  . Stroke Father   . CAD Father   . Heart murmur Sister     Social History   Tobacco Use  . Smoking status: Never Smoker  . Smokeless tobacco: Never Used  Vaping Use  . Vaping Use: Never used  Substance Use Topics  . Alcohol use: Not Currently  . Drug use: Never    Home Medications Prior to Admission medications   Medication Sig Start Date End Date Taking? Authorizing Provider  acetaminophen (TYLENOL) 500 MG tablet Take 1,000 mg by mouth every 6 (six) hours as needed for mild pain.    [provider]  atenolol (TENORMIN) 25 MG tablet Take 3 tablets (75 mg total) by mouth daily. 02/02/19   Love, Evlyn KannerPamela S, PA-C  divalproex (DEPAKOTE ER) 500 MG 24 hr tablet Take 1 tablet (500 mg total) by mouth daily. 02/02/19   Love, Evlyn KannerPamela S, PA-C  flavoxATE (URISPAS) 100 MG tablet Take 1 tablet (100 mg total) by mouth 3 (three) times daily as needed for bladder spasms. 02/01/19   Love, Evlyn KannerPamela S, PA-C  hydrocortisone cream 1 % Apply to affected area 2 times daily 09/28/19   Bethann BerkshireZammit, Joseph, MD  levETIRAcetam (KEPPRA) 500 MG tablet Take 1,500 mg by mouth 2 (two) times daily.    [provider]  liver oil-zinc oxide (DESITIN) 40 % ointment Apply topically 3 (three) times daily. 02/02/19   Love, Evlyn KannerPamela S, PA-C    metroNIDAZOLE (FLAGYL) 500 MG tablet Take 1 tablet (500 mg total) by mouth 2 (two) times daily. One po bid x 7 days 09/28/19   Bethann BerkshireZammit, Joseph, MD  modafinil (PROVIGIL) 100 MG tablet Take 1 tablet (100 mg total) by mouth daily. 02/02/19   Love, Evlyn KannerPamela S, PA-C  nystatin (MYCOSTATIN/NYSTOP) powder Apply topically 2 (two) times daily. 12/28/18   Layne BentonBiby, Sharon L, NP  oxybutynin (DITROPAN) 5 MG tablet Take 1 tablet (5 mg total) by mouth 3 (three) times daily. 02/02/19   Love, Evlyn KannerPamela S, PA-C  pseudoephedrine (SUDAFED) 30 MG tablet Take 1 tablet (30 mg total) by mouth every 6 (six) hours as needed for congestion. 02/01/19   Love, Evlyn KannerPamela S, PA-C  scopolamine (TRANSDERM-SCOP) 1 MG/3DAYS Place 1 patch (1.5 mg total) onto the skin every 3 (three) days. 02/02/19   Love, Evlyn KannerPamela S, PA-C  senna (SENOKOT) 8.6 MG TABS tablet Take 1 tablet (8.6 mg total) by mouth daily. 02/02/19   Love, Evlyn KannerPamela S, PA-C  sulfamethoxazole-trimethoprim (BACTRIM DS) 800-160 MG tablet Take 1 tablet by mouth every 12 (twelve) hours. 02/02/19   Love, Evlyn KannerPamela S, PA-C    Allergies    Latex and Penicillins  Review of Systems   Review of Systems  Constitutional: Positive for chills and fever.  HENT: Negative for sore throat.   Eyes: Negative for visual disturbance.  Respiratory: Positive for cough. Negative for shortness of breath.   Cardiovascular: Negative for chest pain.  Gastrointestinal: Positive for abdominal pain and diarrhea. Negative for vomiting.  Genitourinary: Negative for dysuria.  Musculoskeletal: Positive for gait problem.  Skin: Positive for wound. Negative for rash.  Neurological: Positive for headaches.    Physical Exam Updated Vital Signs BP 116/72 (BP Location: Left Arm)   Pulse (!) 130   Temp (!) 100.5 F (38.1 C) (Oral)   Resp 16   SpO2 96%   Physical Exam Vitals and nursing note reviewed.  Constitutional:      General: He is not in acute distress.    Appearance: Normal appearance. He is well-developed. He is  not ill-appearing.  HENT:     Head: Normocephalic and atraumatic.  Eyes:     Conjunctiva/sclera: Conjunctivae normal.  Cardiovascular:     Rate and Rhythm: Regular rhythm. Tachycardia present.     Pulses: Normal pulses.     Heart sounds: No murmur heard.   Pulmonary:     Effort: Pulmonary effort is normal. No respiratory distress.     Breath sounds: Normal breath sounds.  Abdominal:     Palpations: Abdomen is soft.     Tenderness: There is no abdominal tenderness. There is no guarding or rebound.  Genitourinary:  Comments: He has superficial skin breakdown all around his sacrum at his anus.  Probably 2 palm worth Musculoskeletal:        General: No tenderness.     Cervical back: Neck supple.  Skin:    General: Skin is warm and dry.  Neurological:     Mental Status: He is alert. Mental status is at baseline.     ED Results / Procedures / Treatments   Labs (all labs ordered are listed, but only abnormal results are displayed) Labs Reviewed  COMPREHENSIVE METABOLIC PANEL - Abnormal; Notable for the following components:      Result Value   Glucose, Bld 111 (*)    Albumin 3.0 (*)    ALT 53 (*)    All other components within normal limits  CBC WITH DIFFERENTIAL/PLATELET - Abnormal; Notable for the following components:   WBC 13.9 (*)    RBC 3.90 (*)    Hemoglobin 11.5 (*)    HCT 36.5 (*)    Neutro Abs 11.8 (*)    Monocytes Absolute 1.1 (*)    All other components within normal limits  URINALYSIS, ROUTINE W REFLEX MICROSCOPIC - Abnormal; Notable for the following components:   Color, Urine AMBER (*)    APPearance HAZY (*)    All other components within normal limits  C DIFFICILE QUICK SCREEN W PCR REFLEX  SARS CORONAVIRUS 2 BY RT PCR (HOSPITAL ORDER, PERFORMED IN [facility])  CULTURE, BLOOD (ROUTINE X 2)  CULTURE, BLOOD (ROUTINE X 2)  URINE CULTURE  GASTROINTESTINAL PANEL BY PCR, STOOL (REPLACES STOOL CULTURE)  C DIFFICILE QUICK SCREEN W PCR  REFLEX  LACTIC ACID, PLASMA  LACTIC ACID, PLASMA  MAGNESIUM    EKG EKG Interpretation  Date/Time:  Thursday October 19 2019 19:34:57 EDT Ventricular Rate:  107 PR Interval:    QRS Duration: 124 QT Interval:  359 QTC Calculation: 479 R Axis:   69 Text Interpretation: Sinus tachycardia Right bundle branch block No old tracing to compare Confirmed by Meridee Score [phone]) on 10/19/2019 7:36:17 PM   Radiology DG Chest 2 View  Result Date: 10/19/2019 CLINICAL DATA:  54 year old male with fever and cough. EXAM: CHEST - 2 VIEW COMPARISON:  Chest radiograph dated 12/25/2018. FINDINGS: There is shallow inspiration. No focal consolidation, pleural effusion, pneumothorax. Stable cardiac silhouette. No acute osseous pathology. A shunt catheter appears in similar position. IMPRESSION: No active cardiopulmonary disease. Electronically Signed   By: Elgie Collard M.D.   On: 10/19/2019 19:17   CT Abdomen Pelvis W Contrast  Result Date: 10/19/2019 CLINICAL DATA:  Diarrhea since taking antibiotics for recurrent UTI. Wound on buttock. Abdominal abscess/infection suspected EXAM: CT ABDOMEN AND PELVIS WITH CONTRAST TECHNIQUE: Multidetector CT imaging of the abdomen and pelvis was performed using the standard protocol following bolus administration of intravenous contrast. CONTRAST:  OMNIPAQUE IOHEXOL 300 MG/ML  SOLN COMPARISON:  CT 09/28/2019 FINDINGS: Lower chest: Hypoventilatory changes without confluent airspace disease. Mild distal esophageal wall thickening. Hepatobiliary: Decreased hepatic density consistent with steatosis. No discrete focal lesion. Decompressed gallbladder with multiple gallstones. No pericholecystic inflammation. No biliary dilatation. Pancreas: No ductal dilatation or inflammation. Spleen: Normal in size without focal abnormality. Adrenals/Urinary Tract: Normal adrenal glands. No hydronephrosis. There is early excretion of IV contrast from both renal collecting systems on  portal venous phase. No significant perinephric edema. Decompressed urinary bladder which is minimally thick walled. Stomach/Bowel: Mild distal esophageal wall thickening. Stomach unremarkable. There is no small bowel obstruction or inflammation. Motion limits portions  of small bowel assessment. Normal appendix. There is submucosal fatty infiltration involving the ascending and transverse colon with mild adjacent pericolonic edema. Liquid stool in the descending and scattered throughout the sigmoid colon. No significant colonic wall thickening. Scattered colonic diverticula without diverticulitis. Vascular/Lymphatic: Normal caliber abdominal aorta with minimal atherosclerosis. The portal vein is patent. No acute vascular findings. There multiple small retroperitoneal nodes are not enlarged by size criteria. An enlarged left inguinal node measuring 17 mm short axis, unchanged from prior exam and demonstrates normal lymph node morphology with retained fatty hila, likely reactive. There are few prominent left external iliac nodes. Reproductive: Prostate is unremarkable. Other: Mild skin thickening and soft tissue edema along the inferior gluteal crease. No definite subcutaneous fluid collection. Shunt catheter tubing in the pelvis appears abandoned. This is discontinuous with subcutaneous catheter that is intermittently seen in the lower chest and upper abdominal wall. No intra-abdominal fluid collection or ascites. No free air. Small fat containing umbilical hernia. Musculoskeletal: Stable chronic changes of spinal dysraphism. Chronic deformity of the right proximal femur and acetabulum. Multilevel anterior osteophytes at the lumbar spine. There are no acute or suspicious osseous abnormalities. IMPRESSION: 1. Mild skin thickening and soft tissue edema along the inferior gluteal crease, suspicious for cellulitis. No subcutaneous fluid collection. 2. Submucosal fatty infiltration involving the ascending and transverse  colon with mild adjacent pericolonic edema, suggesting colitis. Liquid stool in the descending and sigmoid colon, consistent with diarrheal illness. 3. Mild distal esophageal wall thickening, can be seen with reflux or esophagitis. 4. Hepatic steatosis. 5. Cholelithiasis without gallbladder inflammation. 6. Minimal colonic diverticulosis without diverticulitis. Aortic Atherosclerosis (ICD10-I70.0). Electronically Signed   By: Keith Rake M.D.   On: 10/19/2019 21:59    Procedures .Critical Care Performed by: Hayden Rasmussen, MD Authorized by: Hayden Rasmussen, MD   Critical care provider statement:    Critical care time (minutes):  45   Critical care time was exclusive of:  Separately billable procedures and treating other patients   Critical care was necessary to treat or prevent imminent or life-threatening deterioration of the following conditions:  Sepsis   Critical care was time spent personally by me on the following activities:  Discussions with consultants, evaluation of patient's response to treatment, examination of patient, ordering and performing treatments and interventions, ordering and review of laboratory studies, ordering and review of radiographic studies, pulse oximetry, re-evaluation of patient's condition, obtaining history from patient or surrogate, review of old charts and development of treatment plan with patient or surrogate   I assumed direction of critical care for this patient from another provider in my specialty: no     (including critical care time)  Medications Ordered in ED Medications  lactated ringers infusion ( Intravenous New Bag/Given 10/20/19 0411)  nystatin cream (MYCOSTATIN) ( Topical Given 10/20/19 0134)  atenolol (TENORMIN) tablet 25 mg (25 mg Oral Given 10/20/19 0854)  lisinopril (ZESTRIL) tablet 5 mg (5 mg Oral Given 10/20/19 0853)  lactobacillus acidophilus (BACID) tablet 2 tablet (2 tablets Oral Given 10/20/19 0851)  flavoxATE (URISPAS) tablet  100 mg (has no administration in time range)  oxybutynin (DITROPAN) tablet 5 mg (has no administration in time range)  levETIRAcetam (KEPPRA) tablet 1,500 mg (1,500 mg Oral Given 10/20/19 0850)  cholecalciferol (VITAMIN D3) tablet 2,000 Units (2,000 Units Oral Given 10/20/19 0853)  ondansetron (ZOFRAN) tablet 4 mg (has no administration in time range)    Or  ondansetron (ZOFRAN) injection 4 mg (has no administration in time range)  enoxaparin (  LOVENOX) injection 40 mg (has no administration in time range)  acetaminophen (TYLENOL) tablet 650 mg (has no administration in time range)    Or  acetaminophen (TYLENOL) suppository 650 mg (has no administration in time range)  meropenem (MERREM) 1 g in sodium chloride 0.9 % 100 mL IVPB (has no administration in time range)  vancomycin (VANCOCIN) IVPB 1000 mg/200 mL premix (has no administration in time range)  pantoprazole (PROTONIX) injection 40 mg (40 mg Intravenous Given 10/20/19 0137)  Gerhardt's butt cream (has no administration in time range)  sodium chloride flush (NS) 0.9 % injection 3 mL (3 mLs Intravenous Given 10/19/19 2229)  acetaminophen (TYLENOL) tablet 650 mg (650 mg Oral Given 10/19/19 1720)  sodium chloride 0.9 % bolus 1,000 mL (0 mLs Intravenous Stopped 10/20/19 0113)  iohexol (OMNIPAQUE) 300 MG/ML solution 100 mL (100 mLs Intravenous Contrast Given 10/19/19 2132)  cefTRIAXone (ROCEPHIN) 1 g in sodium chloride 0.9 % 100 mL IVPB (0 g Intravenous Stopped 10/19/19 2313)  metroNIDAZOLE (FLAGYL) IVPB 500 mg (0 mg Intravenous Stopped 10/19/19 2343)  meropenem (MERREM) 1 g in sodium chloride 0.9 % 100 mL IVPB (0 g Intravenous Stopped 10/20/19 0224)  vancomycin (VANCOREADY) IVPB 1500 mg/300 mL (0 mg Intravenous Stopped 10/20/19 0406)    ED Course  I have reviewed the triage vital signs and the nursing notes.  Pertinent labs & imaging results that were available during my care of the patient were reviewed by me and considered in my medical decision  making (see chart for details).  Clinical Course as of Oct 19 929  Thu Oct 19, 2019  1921 Chest x-ray interpreted by me as no acute infiltrates.   [MB]  2234 Discussed with Dr. Martyn Malay [facility] hospitalist to evaluate the patient for admission.   [MB]    Clinical Course User Index [MB] Terrilee Files, MD   MDM Rules/Calculators/A&P                         This patient complains of diarrhea, abdominal pain, sacral buttock skin breakdown; this involves an extensive number of treatment Options and is a complaint that carries with it a high risk of complications and Morbidity. The differential includes infectious diarrhea, C. difficile, colitis, sepsis, Sirs, cellulitis, abscess  I ordered, reviewed and interpreted labs, which included CBC with elevated white count, mildly low hemoglobin, chemistries fairly normal other than low albumin, urinalysis without signs of infection, C. difficile negative, lactate not elevated. I ordered medication IV fluids, IV antibiotics I ordered imaging studies which included CT abdomen pelvis and chest x-ray and I independently    visualized and interpreted imaging which showed bowel thickening consistent with colitis, areas of cellulitis on the buttocks Additional history obtained from patient's father Previous records obtained and reviewed in epic including last admission last year for intracranial bleed I consulted [facility] hospitalist Dr. Leafy Half and discussed lab and imaging findings  Critical Interventions: Management of SIRS and initiation of fluids and antibiotics  After the interventions stated above, I reevaluated the patient and found hemodynamically improved.  He is in agreement with admission to the hospital for further management.   Final Clinical Impression(s) / ED Diagnoses Final diagnoses:  Colitis, acute  SIRS (systemic inflammatory response syndrome) (HCC)  Pressure injury of skin of sacral region, unspecified injury stage    Rx / DC  Orders ED Discharge Orders    None       Terrilee Files, MD 10/20/19 [phone]

## 2019-10-19 NOTE — ED Triage Notes (Signed)
Reports ongoing diarrhea since taking antibiotics for recurring UTI. Also reports recent cough and has wound on buttock.

## 2019-10-19 NOTE — ED Notes (Signed)
Pt was called for room 3 times and no answer

## 2019-10-20 ENCOUNTER — Encounter (HOSPITAL_COMMUNITY): Payer: Self-pay | Admitting: Internal Medicine

## 2019-10-20 DIAGNOSIS — Z8249 Family history of ischemic heart disease and other diseases of the circulatory system: Secondary | ICD-10-CM | POA: Diagnosis not present

## 2019-10-20 DIAGNOSIS — R609 Edema, unspecified: Secondary | ICD-10-CM | POA: Diagnosis present

## 2019-10-20 DIAGNOSIS — Z91040 Latex allergy status: Secondary | ICD-10-CM | POA: Diagnosis not present

## 2019-10-20 DIAGNOSIS — L03317 Cellulitis of buttock: Secondary | ICD-10-CM | POA: Diagnosis present

## 2019-10-20 DIAGNOSIS — Q0703 Arnold-Chiari syndrome with spina bifida and hydrocephalus: Secondary | ICD-10-CM | POA: Diagnosis not present

## 2019-10-20 DIAGNOSIS — R935 Abnormal findings on diagnostic imaging of other abdominal regions, including retroperitoneum: Secondary | ICD-10-CM | POA: Diagnosis not present

## 2019-10-20 DIAGNOSIS — G40909 Epilepsy, unspecified, not intractable, without status epilepticus: Secondary | ICD-10-CM

## 2019-10-20 DIAGNOSIS — K5900 Constipation, unspecified: Secondary | ICD-10-CM | POA: Diagnosis present

## 2019-10-20 DIAGNOSIS — I10 Essential (primary) hypertension: Secondary | ICD-10-CM

## 2019-10-20 DIAGNOSIS — K209 Esophagitis, unspecified without bleeding: Secondary | ICD-10-CM | POA: Diagnosis present

## 2019-10-20 DIAGNOSIS — K529 Noninfective gastroenteritis and colitis, unspecified: Secondary | ICD-10-CM | POA: Diagnosis present

## 2019-10-20 DIAGNOSIS — K760 Fatty (change of) liver, not elsewhere classified: Secondary | ICD-10-CM | POA: Diagnosis present

## 2019-10-20 DIAGNOSIS — Z87440 Personal history of urinary (tract) infections: Secondary | ICD-10-CM | POA: Diagnosis not present

## 2019-10-20 DIAGNOSIS — R6510 Systemic inflammatory response syndrome (SIRS) of non-infectious origin without acute organ dysfunction: Secondary | ICD-10-CM

## 2019-10-20 DIAGNOSIS — R197 Diarrhea, unspecified: Secondary | ICD-10-CM | POA: Diagnosis present

## 2019-10-20 DIAGNOSIS — N312 Flaccid neuropathic bladder, not elsewhere classified: Secondary | ICD-10-CM

## 2019-10-20 DIAGNOSIS — S81002A Unspecified open wound, left knee, initial encounter: Secondary | ICD-10-CM | POA: Diagnosis present

## 2019-10-20 DIAGNOSIS — K635 Polyp of colon: Secondary | ICD-10-CM | POA: Diagnosis present

## 2019-10-20 DIAGNOSIS — S31809A Unspecified open wound of unspecified buttock, initial encounter: Secondary | ICD-10-CM | POA: Diagnosis not present

## 2019-10-20 DIAGNOSIS — I69398 Other sequelae of cerebral infarction: Secondary | ICD-10-CM | POA: Diagnosis not present

## 2019-10-20 DIAGNOSIS — Z880 Allergy status to penicillin: Secondary | ICD-10-CM | POA: Diagnosis not present

## 2019-10-20 DIAGNOSIS — Q438 Other specified congenital malformations of intestine: Secondary | ICD-10-CM | POA: Diagnosis not present

## 2019-10-20 DIAGNOSIS — N393 Stress incontinence (female) (male): Secondary | ICD-10-CM | POA: Diagnosis present

## 2019-10-20 DIAGNOSIS — Z823 Family history of stroke: Secondary | ICD-10-CM | POA: Diagnosis not present

## 2019-10-20 DIAGNOSIS — Z20822 Contact with and (suspected) exposure to covid-19: Secondary | ICD-10-CM | POA: Diagnosis present

## 2019-10-20 DIAGNOSIS — K644 Residual hemorrhoidal skin tags: Secondary | ICD-10-CM | POA: Diagnosis present

## 2019-10-20 DIAGNOSIS — Z79899 Other long term (current) drug therapy: Secondary | ICD-10-CM | POA: Diagnosis not present

## 2019-10-20 LAB — GASTROINTESTINAL PANEL BY PCR, STOOL (REPLACES STOOL CULTURE)

## 2019-10-20 LAB — MAGNESIUM: Magnesium: 2.3 mg/dL (ref 1.7–2.4)

## 2019-10-20 LAB — SARS CORONAVIRUS 2 BY RT PCR (HOSPITAL ORDER, PERFORMED IN ~~LOC~~ HOSPITAL LAB): SARS Coronavirus 2: NEGATIVE

## 2019-10-20 MED ORDER — OXYBUTYNIN CHLORIDE 5 MG PO TABS
5.0000 mg | ORAL_TABLET | Freq: Three times a day (TID) | ORAL | Status: DC | PRN
  Administered 2019-11-03 – 2019-11-07 (×2): 5 mg via ORAL
  Filled 2019-10-20 (×2): qty 1

## 2019-10-20 MED ORDER — LEVETIRACETAM 500 MG PO TABS
1500.0000 mg | ORAL_TABLET | Freq: Two times a day (BID) | ORAL | Status: DC
  Administered 2019-10-20 – 2019-11-08 (×40): 1500 mg via ORAL
  Filled 2019-10-20 (×41): qty 3

## 2019-10-20 MED ORDER — NYSTATIN 100000 UNIT/GM EX CREA
TOPICAL_CREAM | Freq: Two times a day (BID) | CUTANEOUS | Status: DC
  Administered 2019-10-30: 1 via TOPICAL
  Filled 2019-10-20 (×4): qty 15

## 2019-10-20 MED ORDER — ENOXAPARIN SODIUM 40 MG/0.4ML ~~LOC~~ SOLN
40.0000 mg | SUBCUTANEOUS | Status: DC
  Administered 2019-10-20 – 2019-10-26 (×7): 40 mg via SUBCUTANEOUS
  Filled 2019-10-20 (×8): qty 0.4

## 2019-10-20 MED ORDER — HYDROCORTISONE 1 % EX CREA
TOPICAL_CREAM | Freq: Two times a day (BID) | CUTANEOUS | Status: DC
  Filled 2019-10-20: qty 28

## 2019-10-20 MED ORDER — METRONIDAZOLE IN NACL 5-0.79 MG/ML-% IV SOLN
500.0000 mg | Freq: Three times a day (TID) | INTRAVENOUS | Status: DC
  Administered 2019-10-20 – 2019-10-24 (×12): 500 mg via INTRAVENOUS
  Filled 2019-10-20 (×12): qty 100

## 2019-10-20 MED ORDER — ACETAMINOPHEN 325 MG PO TABS
650.0000 mg | ORAL_TABLET | Freq: Four times a day (QID) | ORAL | Status: DC | PRN
  Administered 2019-10-20 – 2019-11-02 (×3): 650 mg via ORAL
  Filled 2019-10-20 (×3): qty 2

## 2019-10-20 MED ORDER — VANCOMYCIN HCL IN DEXTROSE 1-5 GM/200ML-% IV SOLN: 1000.0000 mg | Freq: Once | INTRAVENOUS | Status: DC

## 2019-10-20 MED ORDER — ACETAMINOPHEN 650 MG RE SUPP: 650.0000 mg | Freq: Four times a day (QID) | RECTAL | Status: DC | PRN

## 2019-10-20 MED ORDER — SODIUM CHLORIDE 0.9 % IV SOLN
1.0000 g | Freq: Once | INTRAVENOUS | Status: AC
  Administered 2019-10-20: 1 g via INTRAVENOUS
  Filled 2019-10-20: qty 1

## 2019-10-20 MED ORDER — PANTOPRAZOLE SODIUM 40 MG IV SOLR
40.0000 mg | Freq: Every day | INTRAVENOUS | Status: DC
  Administered 2019-10-20 (×2): 40 mg via INTRAVENOUS
  Filled 2019-10-20: qty 40

## 2019-10-20 MED ORDER — ONDANSETRON HCL 4 MG PO TABS: 4.0000 mg | ORAL_TABLET | Freq: Four times a day (QID) | ORAL | Status: DC | PRN

## 2019-10-20 MED ORDER — LISINOPRIL 5 MG PO TABS
5.0000 mg | ORAL_TABLET | Freq: Every day | ORAL | Status: DC
  Administered 2019-10-20 – 2019-10-29 (×10): 5 mg via ORAL
  Filled 2019-10-20 (×11): qty 1

## 2019-10-20 MED ORDER — GERHARDT'S BUTT CREAM
TOPICAL_CREAM | Freq: Every day | CUTANEOUS | Status: DC
  Administered 2019-10-30 – 2019-11-04 (×2): 1 via TOPICAL
  Filled 2019-10-20 (×5): qty 1

## 2019-10-20 MED ORDER — SODIUM CHLORIDE 0.9 % IV SOLN
2.0000 g | INTRAVENOUS | Status: DC
  Administered 2019-10-20 – 2019-10-23 (×4): 2 g via INTRAVENOUS
  Filled 2019-10-20 (×4): qty 20

## 2019-10-20 MED ORDER — VANCOMYCIN HCL 1500 MG/300ML IV SOLN
1500.0000 mg | Freq: Once | INTRAVENOUS | Status: AC
  Administered 2019-10-20: 1500 mg via INTRAVENOUS
  Filled 2019-10-20: qty 300

## 2019-10-20 MED ORDER — LACTATED RINGERS IV SOLN: INTRAVENOUS | Status: AC

## 2019-10-20 MED ORDER — FLAVOXATE HCL 100 MG PO TABS
100.0000 mg | ORAL_TABLET | Freq: Three times a day (TID) | ORAL | Status: DC | PRN
  Filled 2019-10-20: qty 1

## 2019-10-20 MED ORDER — SODIUM CHLORIDE 0.9 % IV SOLN
1.0000 g | Freq: Three times a day (TID) | INTRAVENOUS | Status: DC
  Filled 2019-10-20 (×2): qty 1

## 2019-10-20 MED ORDER — METRONIDAZOLE IVPB CUSTOM
250.0000 mg | Freq: Three times a day (TID) | INTRAVENOUS | Status: DC
  Filled 2019-10-20 (×2): qty 50

## 2019-10-20 MED ORDER — ONDANSETRON HCL 4 MG/2ML IJ SOLN: 4.0000 mg | Freq: Four times a day (QID) | INTRAMUSCULAR | Status: DC | PRN

## 2019-10-20 MED ORDER — ATENOLOL 50 MG PO TABS
25.0000 mg | ORAL_TABLET | Freq: Two times a day (BID) | ORAL | Status: DC
  Administered 2019-10-20 – 2019-10-30 (×21): 25 mg via ORAL
  Filled 2019-10-20 (×22): qty 1

## 2019-10-20 MED ORDER — VANCOMYCIN HCL IN DEXTROSE 1-5 GM/200ML-% IV SOLN
1000.0000 mg | Freq: Two times a day (BID) | INTRAVENOUS | Status: DC
  Filled 2019-10-20: qty 200

## 2019-10-20 MED ORDER — BACID PO TABS
2.0000 | ORAL_TABLET | Freq: Every day | ORAL | Status: DC
  Administered 2019-10-20 – 2019-11-08 (×20): 2 via ORAL
  Filled 2019-10-20 (×20): qty 2

## 2019-10-20 MED ORDER — VITAMIN D 25 MCG (1000 UNIT) PO TABS
2000.0000 [IU] | ORAL_TABLET | Freq: Every day | ORAL | Status: DC
  Administered 2019-10-20 – 2019-11-08 (×20): 2000 [IU] via ORAL
  Filled 2019-10-20 (×20): qty 2

## 2019-10-20 NOTE — H&P (Signed)
History and Physical    Carlos Ferguson ZOX:[id number] DOB: [birth year] DOA: 10/19/2019  PCP: System, Pcp Not In  Patient coming from: Home   Chief Complaint:  Chief Complaint  Patient presents with  . 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  Patient explains that approximately 5 weeks or so ago he was initiated on antibiotic therapy for suspected catheter associated urinary tract infection.  Around this time, patient states that he began to develop diarrhea.  Patient explains that this diarrhea is watery with occasional small amounts of blood.  Diarrhea occurs up to 15 times daily.  In the weeks that followed, patient was placed on courses of both Bactrim and nitrofurantoin for suspected urinary tract infection.    Patiently additionally presented to our emergency department in early June and was prescribed a course of Flagyl prescribed by our emergency department in early June.  Patient states that this course of Flagyl did not improve his diarrhea.  As the patient's diarrhea persisted in the following weeks, patient began to also develop right-sided abdominal pain.  His pain is dull and cramping in quality, waxing and waning and associated with bowel movements.  Patient also complains of poor appetite and frequent nausea but only occasional vomiting.  Denies sick contacts.  Patient denies recent travel.    Pain in the buttocks.  This pain is burning in quality, 8 of 10 in intensity, radiating down the legs.  Pain is worse with bowel movements.  Patient symptoms continue to persist until he eventually presented to [facility] emergency  department for evaluation.  Upon evaluation in the emergency department, CT imaging of the abdomen was performed revealing submucosal fatty infiltration involving the ascending and transverse colon with mild adjacent pericolonic edema suggesting colitis.  Patient was additionally found to exhibit multiple SIRS criteria including fever with temperature of 100.5 F and white blood cell count of 13.9.  Patient was initiated on intravenous antibiotic therapy.  Stool studies were obtained including C. difficile which was negative.  The hospitalist group was then called to assess patient for admission to the hospital.    Review of Systems: A 10-system review of systems has been performed and all systems are negative with the exception of what is listed in the HPI.    Past Medical History:  Diagnosis Date  . Cellulitis of left knee 2014  . Flaccid neurogenic bladder    self caths every 4 hours  . Frequent UTI   . Hydrocephalus (HCC) 2014  . ICH (intracerebral hemorrhage) (HCC) 2014   left frontal ICH  . Seizures (HCC)   . Self-catheterizes urinary bladder   . Sepsis (HCC) 2014   due to knee infection   . Spina bifida (HCC)   . 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     Past Surgical History:  Procedure Laterality Date  . CSF SHUNT  1969  . LLE surgery     Multiple surgeries for correction.   Marland Kitchen RLE surgery     surgery for correction      reports that he has never smoked. He has never used smokeless tobacco. He reports previous alcohol use. He reports that he does not use  drugs.  Allergies  Allergen Reactions  . Latex Anaphylaxis and Hives  . Penicillins Hives and Itching    Family History  Problem Relation Age of Onset  . Stroke Mother        light  . Stroke Father   . CAD Father   . Heart murmur Sister      Prior to Admission medications   Medication Sig Start Date End Date Taking? Authorizing Provider  acetaminophen (TYLENOL) 500 MG tablet Take 1,000 mg  by mouth every 6 (six) hours as needed for mild pain.   Yes [provider]  atenolol (TENORMIN) 25 MG tablet Take 3 tablets (75 mg total) by mouth daily. Patient taking differently: Take 25 mg by mouth 2 (two) times daily.  02/02/19  Yes Love, Evlyn Kanner, PA-C  flavoxATE (URISPAS) 100 MG tablet Take 1 tablet (100 mg total) by mouth 3 (three) times daily as needed for bladder spasms. 02/01/19  Yes Love, Evlyn Kanner, PA-C  levETIRAcetam (KEPPRA) 750 MG tablet Take 1,500 mg by mouth 2 (two) times daily. 10/04/19  Yes [provider]  lisinopril (ZESTRIL) 5 MG tablet Take 5 mg by mouth daily. 09/14/19  Yes [provider]  oxybutynin (DITROPAN) 5 MG tablet Take 1 tablet (5 mg total) by mouth 3 (three) times daily. Patient taking differently: Take 5 mg by mouth every 8 (eight) hours as needed for bladder spasms.  02/02/19  Yes Love, Evlyn Kanner, PA-C  Probiotic CAPS Take 1 capsule by mouth daily.   Yes [provider]  sulfamethoxazole-trimethoprim (BACTRIM DS) 800-160 MG tablet Take 1 tablet by mouth 2 (two) times daily.   Yes [provider]  Vitamin D, Cholecalciferol, 50 MCG (2000 UT) CAPS Take 1 capsule by mouth daily.   Yes [provider]  divalproex (DEPAKOTE ER) 500 MG 24 hr tablet Take 1 tablet (500 mg total) by mouth daily. Patient not taking: Reported on 10/19/2019 02/02/19   Love, Evlyn Kanner, PA-C  hydrocortisone cream 1 % Apply to affected area 2 times daily Patient not taking: Reported on 10/19/2019 09/28/19   Bethann Berkshire, MD  liver oil-zinc oxide (DESITIN) 40 % ointment Apply topically 3 (three) times daily. Patient not taking: Reported on 10/19/2019 02/02/19   Love, Evlyn Kanner, PA-C  metroNIDAZOLE (FLAGYL) 500 MG tablet Take 1 tablet (500 mg total) by mouth 2 (two) times daily. One po bid x 7 days Patient not taking: Reported on 10/19/2019 09/28/19   Bethann Berkshire, MD  modafinil (PROVIGIL) 100 MG tablet Take 1 tablet (100 mg total) by mouth  daily. Patient not taking: Reported on 10/19/2019 02/02/19   Love, Evlyn Kanner, PA-C  nystatin (MYCOSTATIN/NYSTOP) powder Apply topically 2 (two) times daily. Patient not taking: Reported on 10/19/2019 12/28/18   Layne Benton, NP  pseudoephedrine (SUDAFED) 30 MG tablet Take 1 tablet (30 mg total) by mouth every 6 (six) hours as needed for congestion. Patient not taking: Reported on 10/19/2019 02/01/19   Love, Evlyn Kanner, PA-C  scopolamine (TRANSDERM-SCOP) 1 MG/3DAYS Place 1 patch (1.5 mg total) onto the skin every 3 (three) days. Patient not taking: Reported on 10/19/2019 02/02/19   Love, Evlyn Kanner, PA-C  senna (SENOKOT) 8.6 MG TABS tablet Take 1 tablet (8.6 mg total) by mouth daily. Patient not taking: Reported on 10/19/2019 02/02/19   Love, Evlyn Kanner, PA-C  sulfamethoxazole-trimethoprim (BACTRIM DS) 800-160 MG tablet Take 1 tablet by mouth every 12 (twelve) hours. Patient not taking: Reported on 10/19/2019 02/02/19   Delle Reining  S, PA-C    Physical Exam: Vitals:   10/19/19 2045 10/19/19 2245 10/19/19 2300 10/19/19 2315  BP: 138/89 126/73 132/77 134/73  Pulse: (!) 118 99 99 98  Resp: (!) 21 12 15 14   Temp:      TempSrc:      SpO2: 99% 99% 100% 100%    Constitutional: Lethargic but arousable, oriented x3, patient is in mild distress due to abdominal pain. Skin: 3 cm diameter shallow ulceration noted over the anterior surface of the left knee without foul-smelling drainage.  Patient additionally found to have extensive perianal skin breakdown with surrounding severe hyperemia and induration extending down the bilateral thighs, worst down the right thigh.   poor skin turgor noted. Eyes: Pupils are equally reactive to light.  No evidence of scleral icterus or conjunctival pallor.  ENMT: Dry mucous membranes noted.  Posterior pharynx clear of any exudate or lesions.   Neck: normal, supple, no masses, no thyromegaly.  No evidence of jugular venous distension.   Respiratory: clear to auscultation bilaterally,  no wheezing, no crackles. Normal respiratory effort. No accessory muscle use.  Cardiovascular: Tachycardic rate but regular rhythm.  No murmurs / rubs / gallops. No extremity edema. 2+ pedal pulses. No carotid bruits.  Chest:   Nontender without crepitus or deformity.   Back:   Nontender without crepitus or deformity. Abdomen: Right-sided abdominal tenderness noted.  Hyperactive bowel sounds.  Abdomen is soft.  No evidence of intra-abdominal masses.    Musculoskeletal: Poor muscle tone noted of the bilateral lower extremities.  Chronic joint deformities noted including extensive left knee joint deformity.  Mild contractures noted of the left lower extremity.   Neurologic: Patient moving all 4 extremity spontaneously.  Sensation is grossly intact.  Cranial 2-12 grossly intact.  Patient is responsive to verbal stimuli. Psychiatric: Patient presents as a normal mood with appropriate affect.  Patient seems to possess insight as to theircurrent situation.     Labs on Admission: I have personally reviewed following labs and imaging studies -   CBC: Recent Labs  Lab 10/19/19 1721  WBC 13.9*  NEUTROABS 11.8*  HGB 11.5*  HCT 36.5*  MCV 93.6  PLT 369   Basic Metabolic Panel: Recent Labs  Lab 10/19/19 1721  NA 136  K 4.4  CL 102  CO2 25  GLUCOSE 111*  BUN 17  CREATININE 0.94  CALCIUM 8.9   GFR: CrCl cannot be calculated (Unknown ideal weight.). Liver Function Tests: Recent Labs  Lab 10/19/19 1721  AST 28  ALT 53*  ALKPHOS 106  BILITOT 0.5  PROT 7.4  ALBUMIN 3.0*   No results for input(s): LIPASE, AMYLASE in the last 168 hours. No results for input(s): AMMONIA in the last 168 hours. Coagulation Profile: No results for input(s): INR, PROTIME in the last 168 hours. Cardiac Enzymes: No results for input(s): CKTOTAL, CKMB, CKMBINDEX, TROPONINI in the last 168 hours. BNP (last 3 results) No results for input(s): PROBNP in the last 8760 hours. HbA1C: No results for input(s):  HGBA1C in the last 72 hours. CBG: No results for input(s): GLUCAP in the last 168 hours. Lipid Profile: No results for input(s): CHOL, HDL, LDLCALC, TRIG, CHOLHDL, LDLDIRECT in the last 72 hours. Thyroid Function Tests: No results for input(s): TSH, T4TOTAL, FREET4, T3FREE, THYROIDAB in the last 72 hours. Anemia Panel: No results for input(s): VITAMINB12, FOLATE, FERRITIN, TIBC, IRON, RETICCTPCT in the last 72 hours. Urine analysis:    Component Value Date/Time   COLORURINE AMBER (A) 10/19/2019 1937  APPEARANCEUR HAZY (A) 10/19/2019 1937   LABSPEC 1.026 10/19/2019 1937   PHURINE 5.0 10/19/2019 1937   GLUCOSEU NEGATIVE 10/19/2019 1937   HGBUR NEGATIVE 10/19/2019 1937   BILIRUBINUR NEGATIVE 10/19/2019 1937   KETONESUR NEGATIVE 10/19/2019 1937   PROTEINUR NEGATIVE 10/19/2019 1937   NITRITE NEGATIVE 10/19/2019 1937   LEUKOCYTESUR NEGATIVE 10/19/2019 1937    Radiological Exams on Admission - Personally Reviewed: DG Chest 2 View  Result Date: 10/19/2019 CLINICAL DATA:  54 year old male with fever and cough. EXAM: CHEST - 2 VIEW COMPARISON:  Chest radiograph dated 12/25/2018. FINDINGS: There is shallow inspiration. No focal consolidation, pleural effusion, pneumothorax. Stable cardiac silhouette. No acute osseous pathology. A shunt catheter appears in similar position. IMPRESSION: No active cardiopulmonary disease. Electronically Signed   By: Elgie Collard M.D.   On: 10/19/2019 19:17   CT Abdomen Pelvis W Contrast  Result Date: 10/19/2019 CLINICAL DATA:  Diarrhea since taking antibiotics for recurrent UTI. Wound on buttock. Abdominal abscess/infection suspected EXAM: CT ABDOMEN AND PELVIS WITH CONTRAST TECHNIQUE: Multidetector CT imaging of the abdomen and pelvis was performed using the standard protocol following bolus administration of intravenous contrast. CONTRAST:  OMNIPAQUE IOHEXOL 300 MG/ML  SOLN COMPARISON:  CT 09/28/2019 FINDINGS: Lower chest: Hypoventilatory changes  without confluent airspace disease. Mild distal esophageal wall thickening. Hepatobiliary: Decreased hepatic density consistent with steatosis. No discrete focal lesion. Decompressed gallbladder with multiple gallstones. No pericholecystic inflammation. No biliary dilatation. Pancreas: No ductal dilatation or inflammation. Spleen: Normal in size without focal abnormality. Adrenals/Urinary Tract: Normal adrenal glands. No hydronephrosis. There is early excretion of IV contrast from both renal collecting systems on portal venous phase. No significant perinephric edema. Decompressed urinary bladder which is minimally thick walled. Stomach/Bowel: Mild distal esophageal wall thickening. Stomach unremarkable. There is no small bowel obstruction or inflammation. Motion limits portions of small bowel assessment. Normal appendix. There is submucosal fatty infiltration involving the ascending and transverse colon with mild adjacent pericolonic edema. Liquid stool in the descending and scattered throughout the sigmoid colon. No significant colonic wall thickening. Scattered colonic diverticula without diverticulitis. Vascular/Lymphatic: Normal caliber abdominal aorta with minimal atherosclerosis. The portal vein is patent. No acute vascular findings. There multiple small retroperitoneal nodes are not enlarged by size criteria. An enlarged left inguinal node measuring 17 mm short axis, unchanged from prior exam and demonstrates normal lymph node morphology with retained fatty hila, likely reactive. There are few prominent left external iliac nodes. Reproductive: Prostate is unremarkable. Other: Mild skin thickening and soft tissue edema along the inferior gluteal crease. No definite subcutaneous fluid collection. Shunt catheter tubing in the pelvis appears abandoned. This is discontinuous with subcutaneous catheter that is intermittently seen in the lower chest and upper abdominal wall. No intra-abdominal fluid collection or  ascites. No free air. Small fat containing umbilical hernia. Musculoskeletal: Stable chronic changes of spinal dysraphism. Chronic deformity of the right proximal femur and acetabulum. Multilevel anterior osteophytes at the lumbar spine. There are no acute or suspicious osseous abnormalities. IMPRESSION: 1. Mild skin thickening and soft tissue edema along the inferior gluteal crease, suspicious for cellulitis. No subcutaneous fluid collection. 2. Submucosal fatty infiltration involving the ascending and transverse colon with mild adjacent pericolonic edema, suggesting colitis. Liquid stool in the descending and sigmoid colon, consistent with diarrheal illness. 3. Mild distal esophageal wall thickening, can be seen with reflux or esophagitis. 4. Hepatic steatosis. 5. Cholelithiasis without gallbladder inflammation. 6. Minimal colonic diverticulosis without diverticulitis. Aortic Atherosclerosis (ICD10-I70.0). Electronically Signed   By: Narda Rutherford  M.D.   On: 10/19/2019 21:59    EKG: Personally reviewed.  Rhythm is tachycardia with heart rate of 107 bpm.  Right bundle branch block,  no dynamic ST segment changes appreciated.  Assessment/Plan Principal Problem:   Cellulitis of multiple sites of buttock   Patient exhibits extensive cellulitis of the buttocks due to extensive skin breakdown from 5 weeks of severe diarrhea.  This is resulted in a cellulitis that extends down both lower extremities, worse down the right lower extremity.  Patient is suffering from multiple SIRS criteria as result without sepsis (no organ failure).   Due to extensive nature of infection, initiating broad-spectrum intravenous antibiotic therapy per the cellulitis order set with meropenem and vancomycin.  Blood cultures have been obtained  Hydrating patient aggressively with intravenous isotonic fluids.  Extensive skin breakdown in the perianal region will be treated with a topical combination of nystatin and  hydrocortisone.  We will additionally obtain wound care consultation.  Monitor closely for clinical improvement.  Active Problems:   Acute diarrhea   Patient has been suffering from 5 weeks of acute diarrhea.  Evidence of colonic thickening concerning for acute infectious colitis on CT imaging of the abdomen.  Based on history considering onset of symptoms after initiation of antibiotics this is suspicious for C. difficile however C. difficile testing performed here in the emergency department is negative.  Awaiting remainder of GI panel to identify if there is an underlying infectious etiology.  Clear liquid diet for now    Buttock wound, unspecified laterality, initial encounter   Please see assessment and plan above, concern for substantial skin breakdown and wound formation of the perianal area  Finding combination of nystatin and hydrocortisone  Wound care consultation    SIRS (systemic inflammatory response syndrome) (HCC)   Please see assessment and plan above    Essential hypertension   Continue home regimen of atenolol    Seizure disorder ([place])   Continue home regimen of Keppra    Neurogenic bladder, flaccid   Continue intermittent catheterization per home regimen    Open wound of left knee   Patient reports 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    Esophagitis  Identification of esophagitis is an incidental finding on CT imaging the abdomen pelvis.  No associated symptoms at this time  Initiating daily PPI, patient would benefit from outpatient GI evaluation concerning this.   Code Status:  Full code Family Communication: Father has been updated on plan of care via phone conversation  Status is: Inpatient  Remains inpatient appropriate because:Ongoing diagnostic  testing needed not appropriate for outpatient work up, IV treatments appropriate due to intensity of illness or inability to take PO and Inpatient level of care appropriate due to severity of illness   Dispo: The patient is from: Home              Anticipated d/c is to: Home              Anticipated d/c date is: > 3 days              Patient currently is not medically stable to d/c.        Vernelle Emerald MD [facility] Pager [phone]  If 7PM-7AM, please contact night-coverage [email or web address] Use universal [place] password for that web  site. If you do not have the password, please call the hospital operator.  10/20/2019, 1:13 AM

## 2019-10-20 NOTE — Consult Note (Addendum)
WOC Nurse Consult Note: Patient receiving care in [facility] 5M19. Patient able to turn self. Reason for Consult: wounds to left knee and buttocks/thighs/scrotum Wound type: left knee wound is approximately 3 decades old and started after a knee surgery.  This wound is slick, pale, and measures 3.5 cm x 3 cm x 0.1 cm.  There is no odor, no drainage, no induration.  For this wound:  Wash left knee wound with soap and water. Pat dry. Place a piece of Xeroform gauze [place] [id number]) over the wound, then a foam pad. Change every 2 days. The buttocks has a loss of tissue that is irregularly shaped, impacts both buttocks, coccyx and appears directly related to weeks long severe diarrhea with incontinence.  The wound measures 10 cm x 10 cm x 0.1 cm.  It is surrounded by excoriated, extreme redness on the entire buttocks, posterior upper and inner thighs, scrotum.  Again, from MASD-IAD.  Incidentally, he occasionally has stress urinary incontinence with coughing. Pressure Injury POA: Yes/No/NA Measurement: Wound bed: Drainage (amount, consistency, odor)  Periwound: Dressing procedure/placement/frequency: For the all areas impacted by MASD-IAD I have ordered frequent cleansing with no rinse cleanser and application of Gerhardt's butt cream.  For the wound areas I have also added foam dressings over those in addition to the Gerhardt's.  And, the primary RN, Lannette Donath, is investigating the possibility of a Flexiseal.  I explained the product to the patient and it's purpose, and he is willing to try it if an order for one can be obtained.  The patient's buttocks injuries from IAD will not improve at this point without the use of such a device. Monitor the wound area(s) for worsening of condition such as: Signs/symptoms of infection,  Increase in size,  Development of or worsening of odor, Development of pain, or increased pain at the affected locations.  Notify the medical team if any of these develop.  Thank you for the  consult.  Discussed plan of care with the patient and bedside nurse.  WOC nurse will not follow at this time.  Please re-consult the WOC team if needed.  Helmut Muster, RN, MSN, CWOCN, CNS-BC, pager [phone]

## 2019-10-20 NOTE — Progress Notes (Signed)
Admitted to 5M19 via stretcher from ED. All belongings brought upstairs per pt. No family with patient but father knows where patient is. Assessment and VS as charted. Call bell explained and placed within reach. Patient requests all 4 siderails up at this time.

## 2019-10-20 NOTE — Progress Notes (Signed)
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 -approximately 5 weeks or so ago he was started on antibiotic therapy for suspected catheter associated urinary tract infection.  Around this time, patient states that he began to develop diarrhea.  Patient explains that this diarrhea is watery with occasional small amounts of blood.  Diarrhea occurs up to 15 times daily. -In the weeks that followed, patient was placed on courses of both Bactrim and nitrofurantoin for suspected urinary tract infection.   -Presented to [facility] emergency room, work-up in the ED noted concern for ascending and transverse colitis, also noted to have low-grade temp of 100.5 and a white count 13.9, in addition noted to have full-thickness wounds on his bottom with tissue loss   Assessment & Plan:   Colitis Subacute diarrhea -Clinically I would be concerned for C. difficile colitis or antibiotic associated diarrhea -CT does note colitis of ascending and transverse colon -C. difficile PCR screen negative x1, will repeat -GI pathogen panel was negative -DC meropenem, will use ceftriaxone and Flagyl -Clear liquid diet, gentle IV fluids today -If infectious work-up is negative, could try low-dose Imodium x1  Multiple full-thickness buttock wounds with tissue loss -Present on admission -Worsened in the setting of ongoing diarrhea, MASD -Place Flexi-Seal -Attempt to keep this clean and dry -Does not appear actively infected, DC IV vancomycin -Continue local care, appreciate wound care  consult  Essential hypertension -Continue home regimen of atenolol  Seizure disorder Spina bifida -Continue Keppra  Neurogenic bladder -Continue self-catheterization per home regimen  Esophagitis -Started PPI  DVT prophylaxis: Lovenox Code Status: Full code Family Communication: No family at bedside Disposition Plan:  Status is: Inpatient  Remains inpatient appropriate because:Inpatient level of care appropriate due to severity of illness   Dispo: The patient is from: Home              Anticipated d/c is to: Home              Anticipated d/c date is: 2 days              Patient currently is not medically stable to d/c.  Consultants:    Procedures:   Antimicrobials:    Subjective: -Continues to have profuse diarrhea  Objective: Vitals:   10/20/19 0434 10/20/19 0518 10/20/19 0542 10/20/19 0950  BP: (!) 162/82 124/75  113/65  Pulse: 80 83  76  Resp: 15 18  18   Temp: 97.7 F (36.5 C) 98 F (36.7 C)  97.7 F (36.5 C)  TempSrc: Oral Oral  Oral  SpO2: 100% 100%  97%  Weight:   69.4 kg   Height:   5' (1.524 m)     Intake/Output Summary (Last 24 hours) at 10/20/2019 1137 Last data filed at 10/20/2019 0900 Gross per 24 hour  Intake 1088.32 ml  Output 0 ml  Net 1088.32 ml   Filed Weights   10/20/19 0542  Weight: 69.4 kg    Examination:  General exam: Pleasant small statured male sitting up in bed, AAOx3, no distress Respiratory system: Clear Cardiovascular system: S1 & S2 heard, RRR.  Gastrointestinal system: Abdomen is nondistended, soft and  nontender.Normal bowel sounds heard. Central nervous system: Alert and oriented. Atrophied and short statured lower extremities Extremities: No edema Skin: Wounds to his left knee and buttocks/thighs Psychiatry:Mood & affect appropriate.     Data Reviewed:   CBC: Recent Labs  Lab 10/19/19 1721  WBC 13.9*  NEUTROABS 11.8*  HGB 11.5*  HCT 36.5*  MCV 93.6  PLT 369   Basic Metabolic Panel: Recent Labs   Lab 10/19/19 1721  NA 136  K 4.4  CL 102  CO2 25  GLUCOSE 111*  BUN 17  CREATININE 0.94  CALCIUM 8.9  MG 2.3   GFR: Estimated Creatinine Clearance: 75.2 mL/min (by C-G formula based on SCr of 0.94 mg/dL). Liver Function Tests: Recent Labs  Lab 10/19/19 1721  AST 28  ALT 53*  ALKPHOS 106  BILITOT 0.5  PROT 7.4  ALBUMIN 3.0*   No results for input(s): LIPASE, AMYLASE in the last 168 hours. No results for input(s): AMMONIA in the last 168 hours. Coagulation Profile: No results for input(s): INR, PROTIME in the last 168 hours. Cardiac Enzymes: No results for input(s): CKTOTAL, CKMB, CKMBINDEX, TROPONINI in the last 168 hours. BNP (last 3 results) No results for input(s): PROBNP in the last 8760 hours. HbA1C: No results for input(s): HGBA1C in the last 72 hours. CBG: No results for input(s): GLUCAP in the last 168 hours. Lipid Profile: No results for input(s): CHOL, HDL, LDLCALC, TRIG, CHOLHDL, LDLDIRECT in the last 72 hours. Thyroid Function Tests: No results for input(s): TSH, T4TOTAL, FREET4, T3FREE, THYROIDAB in the last 72 hours. Anemia Panel: No results for input(s): VITAMINB12, FOLATE, FERRITIN, TIBC, IRON, RETICCTPCT in the last 72 hours. Urine analysis:    Component Value Date/Time   COLORURINE AMBER (A) 10/19/2019 1937   APPEARANCEUR HAZY (A) 10/19/2019 1937   LABSPEC 1.026 10/19/2019 1937   PHURINE 5.0 10/19/2019 1937   GLUCOSEU NEGATIVE 10/19/2019 1937   HGBUR NEGATIVE 10/19/2019 1937   BILIRUBINUR NEGATIVE 10/19/2019 1937   KETONESUR NEGATIVE 10/19/2019 1937   PROTEINUR NEGATIVE 10/19/2019 1937   NITRITE NEGATIVE 10/19/2019 1937   LEUKOCYTESUR NEGATIVE 10/19/2019 1937   Sepsis Labs: @LABRCNTIP (procalcitonin:4,lacticidven:4)  ) Recent Results (from the past 240 hour(s))  Gastrointestinal Panel by PCR , Stool     Status: None   Collection Time: 10/19/19  7:37 PM   Specimen: Urine, Catheterized; Stool  Result Value Ref Range Status    Campylobacter species NOT DETECTED NOT DETECTED Final   Plesimonas shigelloides NOT DETECTED NOT DETECTED Final   Salmonella species NOT DETECTED NOT DETECTED Final   Yersinia enterocolitica NOT DETECTED NOT DETECTED Final   Vibrio species NOT DETECTED NOT DETECTED Final   Vibrio cholerae NOT DETECTED NOT DETECTED Final   Enteroaggregative E coli (EAEC) NOT DETECTED NOT DETECTED Final   Enteropathogenic E coli (EPEC) NOT DETECTED NOT DETECTED Final   Enterotoxigenic E coli (ETEC) NOT DETECTED NOT DETECTED Final   Shiga like toxin producing E coli (STEC) NOT DETECTED NOT DETECTED Final   Shigella/Enteroinvasive E coli (EIEC) NOT DETECTED NOT DETECTED Final   Cryptosporidium NOT DETECTED NOT DETECTED Final   Cyclospora cayetanensis NOT DETECTED NOT DETECTED Final   Entamoeba histolytica NOT DETECTED NOT DETECTED Final   Giardia lamblia NOT DETECTED NOT DETECTED Final   Adenovirus F40/41 NOT DETECTED NOT DETECTED Final   Astrovirus NOT DETECTED NOT DETECTED Final   Norovirus GI/GII NOT DETECTED NOT DETECTED Final   Rotavirus A NOT DETECTED NOT DETECTED Final   Sapovirus (I, II, IV, and V) NOT  DETECTED NOT DETECTED Final    Comment: Performed at [facility], [address] [place], [place] [address]  C Difficile Quick Screen w PCR reflex     Status: None   Collection Time: 10/19/19  7:37 PM   Specimen: Urine, Catheterized; Stool  Result Value Ref Range Status   C Diff antigen NEGATIVE NEGATIVE Final   C Diff toxin NEGATIVE NEGATIVE Final   C Diff interpretation No C. difficile detected.  Final    Comment: Performed at [facility], [address] [place], [place] [address]  SARS Coronavirus 2 by RT PCR (hospital order, performed in [facility] hospital lab) Nasopharyngeal Nasopharyngeal Swab     Status: None   Collection Time: 10/20/19  1:55 AM   Specimen: Nasopharyngeal Swab  Result Value Ref Range Status   SARS Coronavirus 2 NEGATIVE NEGATIVE Final    Comment:  (NOTE) SARS-CoV-2 target nucleic acids are NOT DETECTED.  The SARS-CoV-2 RNA is generally detectable in upper and lower respiratory specimens during the acute phase of infection. The lowest concentration of SARS-CoV-2 viral copies this assay can detect is 250 copies / mL. A negative result does not preclude SARS-CoV-2 infection and should not be used as the sole basis for treatment or other patient management decisions.  A negative result may occur with improper specimen collection / handling, submission of specimen other than nasopharyngeal swab, presence of viral mutation(s) within the areas targeted by this assay, and inadequate number of viral copies (<250 copies / mL). A negative result must be combined with clinical observations, patient history, and epidemiological information.  Fact Sheet for Patients:   BoilerBrush.com.cy  Fact Sheet for Healthcare Providers: https://pope.com/  This test is not yet approved or  cleared by the [place] FDA and has been authorized for detection and/or diagnosis of SARS-CoV-2 by FDA under an Emergency Use Authorization (EUA).  This EUA will remain in effect (meaning this test can be used) for the duration of the COVID-19 declaration under Section 564(b)(1) of the Act, 21 U.S.C. section 360bbb-3(b)(1), unless the authorization is terminated or revoked sooner.  Performed at [facility], [address] [place], [place] [address]          Radiology Studies: DG Chest 2 View  Result Date: 10/19/2019 CLINICAL DATA:  54 year old male with fever and cough. EXAM: CHEST - 2 VIEW COMPARISON:  Chest radiograph dated 12/25/2018. FINDINGS: There is shallow inspiration. No focal consolidation, pleural effusion, pneumothorax. Stable cardiac silhouette. No acute osseous pathology. A shunt catheter appears in similar position. IMPRESSION: No active cardiopulmonary disease. Electronically Signed    By: Elgie Collard M.D.   On: 10/19/2019 19:17   CT Abdomen Pelvis W Contrast  Result Date: 10/19/2019 CLINICAL DATA:  Diarrhea since taking antibiotics for recurrent UTI. Wound on buttock. Abdominal abscess/infection suspected EXAM: CT ABDOMEN AND PELVIS WITH CONTRAST TECHNIQUE: Multidetector CT imaging of the abdomen and pelvis was performed using the standard protocol following bolus administration of intravenous contrast. CONTRAST:  OMNIPAQUE IOHEXOL 300 MG/ML  SOLN COMPARISON:  CT 09/28/2019 FINDINGS: Lower chest: Hypoventilatory changes without confluent airspace disease. Mild distal esophageal wall thickening. Hepatobiliary: Decreased hepatic density consistent with steatosis. No discrete focal lesion. Decompressed gallbladder with multiple gallstones. No pericholecystic inflammation. No biliary dilatation. Pancreas: No ductal dilatation or inflammation. Spleen: Normal in size without focal abnormality. Adrenals/Urinary Tract: Normal adrenal glands. No hydronephrosis. There is early excretion of IV contrast from both renal collecting systems on portal venous phase. No  significant perinephric edema. Decompressed urinary bladder which is minimally thick walled. Stomach/Bowel: Mild distal esophageal wall thickening. Stomach unremarkable. There is no small bowel obstruction or inflammation. Motion limits portions of small bowel assessment. Normal appendix. There is submucosal fatty infiltration involving the ascending and transverse colon with mild adjacent pericolonic edema. Liquid stool in the descending and scattered throughout the sigmoid colon. No significant colonic wall thickening. Scattered colonic diverticula without diverticulitis. Vascular/Lymphatic: Normal caliber abdominal aorta with minimal atherosclerosis. The portal vein is patent. No acute vascular findings. There multiple small retroperitoneal nodes are not enlarged by size criteria. An enlarged left inguinal node measuring 17 mm  short axis, unchanged from prior exam and demonstrates normal lymph node morphology with retained fatty hila, likely reactive. There are few prominent left external iliac nodes. Reproductive: Prostate is unremarkable. Other: Mild skin thickening and soft tissue edema along the inferior gluteal crease. No definite subcutaneous fluid collection. Shunt catheter tubing in the pelvis appears abandoned. This is discontinuous with subcutaneous catheter that is intermittently seen in the lower chest and upper abdominal wall. No intra-abdominal fluid collection or ascites. No free air. Small fat containing umbilical hernia. Musculoskeletal: Stable chronic changes of spinal dysraphism. Chronic deformity of the right proximal femur and acetabulum. Multilevel anterior osteophytes at the lumbar spine. There are no acute or suspicious osseous abnormalities. IMPRESSION: 1. Mild skin thickening and soft tissue edema along the inferior gluteal crease, suspicious for cellulitis. No subcutaneous fluid collection. 2. Submucosal fatty infiltration involving the ascending and transverse colon with mild adjacent pericolonic edema, suggesting colitis. Liquid stool in the descending and sigmoid colon, consistent with diarrheal illness. 3. Mild distal esophageal wall thickening, can be seen with reflux or esophagitis. 4. Hepatic steatosis. 5. Cholelithiasis without gallbladder inflammation. 6. Minimal colonic diverticulosis without diverticulitis. Aortic Atherosclerosis (ICD10-I70.0). Electronically Signed   By: Keith Rake M.D.   On: 10/19/2019 21:59        Scheduled Meds:  atenolol  25 mg Oral BID   cholecalciferol  2,000 Units Oral Daily   enoxaparin (LOVENOX) injection  40 mg Subcutaneous Q24H   Gerhardt's butt cream   Topical 6 X Daily   lactobacillus acidophilus  2 tablet Oral Daily   levETIRAcetam  1,500 mg Oral BID   lisinopril  5 mg Oral Daily   nystatin cream   Topical BID   pantoprazole (PROTONIX) IV   40 mg Intravenous QHS   Continuous Infusions:  lactated ringers 125 mL/hr at 10/20/19 0411   meropenem (MERREM) IV     vancomycin       LOS: 0 days    Time spent: 22min    Domenic Polite, MD [facility] 10/20/2019, 11:37 AM

## 2019-10-20 NOTE — Progress Notes (Signed)
Pharmacy Antibiotic Note  Carlos Ferguson is a 54 y.o. male admitted on 10/19/2019 with buttock cellulitis.  Pharmacy has been consulted for Vancomycin and Meropenem dosing.  Pt with spina bifida. Noted pt with multiple courses of abx for recurring UTIs.   Wt ~70 kg SCr 0.92, unsure whether reflective of true renal function with spina bifida.   Plan: Meropenem 1gm IV q8h Vancomycin 1500mg  IV now then 1000 mg IV Q 12 hrs.  Will f/u renal function and UOP, micro data, and pt's clinical condition Vanc levels prn      Temp (24hrs), Avg:100.5 F (38.1 C), Min:100.5 F (38.1 C), Max:100.5 F (38.1 C)  Recent Labs  Lab 10/19/19 1721 10/19/19 2051  WBC 13.9*  --   CREATININE 0.94  --   LATICACIDVEN 1.2 1.0    CrCl cannot be calculated (Unknown ideal weight.).    Allergies  Allergen Reactions  . Latex Anaphylaxis and Hives  . Penicillins Hives and Itching    Antimicrobials this admission: 6/25 Meropenem >>  6/25 Vanc >>   Microbiology results: 6/24 BCx:  6/24 UCx:   6/24 Cdiff PCR: negative  Thank you for allowing pharmacy to be a part of this patient's care.  7/24, PharmD, BCPS Please see amion for complete clinical pharmacist phone list 10/20/2019 12:51 AM

## 2019-10-21 LAB — URINE CULTURE: Culture: NO GROWTH

## 2019-10-21 LAB — MAGNESIUM: Magnesium: 2.1 mg/dL (ref 1.7–2.4)

## 2019-10-21 LAB — CBC WITH DIFFERENTIAL/PLATELET
Abs Immature Granulocytes: 0.02 10*3/uL (ref 0.00–0.07)
Basophils Absolute: 0 10*3/uL (ref 0.0–0.1)
Basophils Relative: 0 %
Eosinophils Absolute: 0.1 10*3/uL (ref 0.0–0.5)
Eosinophils Relative: 3 %
HCT: 34 % — ABNORMAL LOW (ref 39.0–52.0)
Hemoglobin: 10.8 g/dL — ABNORMAL LOW (ref 13.0–17.0)
Immature Granulocytes: 0 %
Lymphocytes Relative: 21 %
Lymphs Abs: 1.2 10*3/uL (ref 0.7–4.0)
MCH: 29.7 pg (ref 26.0–34.0)
MCHC: 31.8 g/dL (ref 30.0–36.0)
MCV: 93.4 fL (ref 80.0–100.0)
Monocytes Absolute: 0.4 10*3/uL (ref 0.1–1.0)
Monocytes Relative: 7 %
Neutro Abs: 3.8 10*3/uL (ref 1.7–7.7)
Neutrophils Relative %: 69 %
Platelets: 263 10*3/uL (ref 150–400)
RBC: 3.64 MIL/uL — ABNORMAL LOW (ref 4.22–5.81)
RDW: 14 % (ref 11.5–15.5)
WBC: 5.6 10*3/uL (ref 4.0–10.5)
nRBC: 0 % (ref 0.0–0.2)

## 2019-10-21 LAB — COMPREHENSIVE METABOLIC PANEL
ALT: 29 U/L (ref 0–44)
AST: 15 U/L (ref 15–41)
Albumin: 2.4 g/dL — ABNORMAL LOW (ref 3.5–5.0)
Alkaline Phosphatase: 66 U/L (ref 38–126)
Anion gap: 13 (ref 5–15)
BUN: 6 mg/dL (ref 6–20)
CO2: 21 mmol/L — ABNORMAL LOW (ref 22–32)
Calcium: 8.6 mg/dL — ABNORMAL LOW (ref 8.9–10.3)
Chloride: 107 mmol/L (ref 98–111)
Creatinine, Ser: 0.71 mg/dL (ref 0.61–1.24)
GFR calc Af Amer: >60 mL/min (ref >60)
GFR calc non Af Amer: >60 mL/min (ref >60)
Glucose, Bld: 95 mg/dL (ref 70–99)
Potassium: 4.4 mmol/L (ref 3.5–5.1)
Sodium: 141 mmol/L (ref 135–145)
Total Bilirubin: 0.6 mg/dL (ref 0.3–1.2)
Total Protein: 6.2 g/dL — ABNORMAL LOW (ref 6.5–8.1)

## 2019-10-21 LAB — GLUCOSE, CAPILLARY: Glucose-Capillary: 77 mg/dL (ref 70–99)

## 2019-10-21 MED ORDER — LACTATED RINGERS IV SOLN: INTRAVENOUS | Status: AC

## 2019-10-21 MED ORDER — PANTOPRAZOLE SODIUM 40 MG PO TBEC
40.0000 mg | DELAYED_RELEASE_TABLET | Freq: Every day | ORAL | Status: DC
  Administered 2019-10-21 – 2019-11-07 (×18): 40 mg via ORAL
  Filled 2019-10-21 (×18): qty 1

## 2019-10-21 NOTE — Plan of Care (Signed)
  Problem: Elimination: Goal: Will not experience complications related to bowel motility Outcome: Progressing   

## 2019-10-21 NOTE — Progress Notes (Signed)
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 -approximately 5 weeks or so ago he was started on antibiotic therapy for suspected catheter associated urinary tract infection.  Around this time, patient states that he began to develop diarrhea.  Patient explains that this diarrhea is watery with occasional small amounts of blood.  Diarrhea occurs up to 15 times daily. -In the weeks that followed, patient was placed on courses of both Bactrim and nitrofurantoin for suspected urinary tract infection.   -Presented to [facility] emergency room, work-up in the ED noted concern for ascending and transverse colitis, also noted to have low-grade temp of 100.5 and a white count 13.9, in addition noted to have full-thickness wounds on his bottom with tissue loss   Assessment & Plan:   Colitis Subacute diarrhea -Clinically still concerned for C. difficile colitis or antibiotic associated diarrhea -CT did note colitis of ascending and transverse colon -C. difficile PCR screen negative x1, repeat is pending -GI pathogen panel was negative -Continue IV ceftriaxone and Flagyl  -Advance diet, continue IV fluids today -If infectious work-up is negative, could try low-dose Imodium x1  Multiple full-thickness buttock wounds with tissue loss -Present on admission -Worsened in the setting of ongoing diarrhea, MASD -Continue Flexi-Seal -Attempt to keep this clean and dry -Does not appear actively infected, DC IV vancomycin -Continue local care, appreciate wound care  consult  Essential hypertension -Continue home regimen of atenolol  Seizure disorder Spina bifida -Continue Keppra  Neurogenic bladder -Continue self-catheterization per home regimen  Esophagitis -Started PPI  DVT prophylaxis: Lovenox Code Status: Full code Family Communication: No family at bedside Disposition Plan:  Status is: Inpatient  Remains inpatient appropriate because:Inpatient level of care appropriate due to severity of illness   Dispo: The patient is from: Home              Anticipated d/c is to: Home              Anticipated d/c date is: 2-3 days              Patient currently is not medically stable to d/c.  Consultants:    Procedures:   Antimicrobials:    Subjective: -Feels better overall, continues to have diarrhea, Flexi-Seal placed yesterday  Objective: Vitals:   10/20/19 1809 10/20/19 2050 10/21/19 0542 10/21/19 0947  BP: 107/71 117/72 114/69 (!) 102/58  Pulse: 75 75 75 69  Resp: 18 20 20 18   Temp: 98.3 F (36.8 C) 97.9 F (36.6 C) 97.8 F (36.6 C)   TempSrc: Oral Oral Oral   SpO2: 100% 100% 100% 100%  Weight:      Height:        Intake/Output Summary (Last 24 hours) at 10/21/2019 1241 Last data filed at 10/21/2019 1104 Gross per 24 hour  Intake 2246.25 ml  Output 3225 ml  Net -978.75 ml   Filed Weights   10/20/19 0542  Weight: 69.4 kg    Examination:  General exam: 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     Data Reviewed:   CBC: Recent Labs  Lab 10/19/19 1721 10/21/19 0326  WBC 13.9* 5.6  NEUTROABS 11.8* 3.8  HGB 11.5* 10.8*  HCT 36.5* 34.0*  MCV 93.6  93.4  PLT 369 263   Basic Metabolic Panel: Recent Labs  Lab 10/19/19 1721 10/21/19 0326  NA 136 141  K 4.4 4.4  CL 102 107  CO2 25 21*  GLUCOSE 111* 95  BUN 17 6  CREATININE 0.94 0.71  CALCIUM 8.9 8.6*  MG 2.3 2.1   GFR: Estimated Creatinine Clearance: 88.3 mL/min (by C-G formula based on SCr of 0.71 mg/dL). Liver Function Tests: Recent Labs  Lab 10/19/19 1721 10/21/19 0326  AST 28 15  ALT 53* 29  ALKPHOS 106 66  BILITOT 0.5 0.6  PROT 7.4 6.2*  ALBUMIN 3.0* 2.4*   No results for input(s): LIPASE, AMYLASE in the last 168 hours. No results for input(s): AMMONIA in the last 168 hours. Coagulation Profile: No results for input(s): INR, PROTIME in the last 168 hours. Cardiac Enzymes: No results for input(s): CKTOTAL, CKMB, CKMBINDEX, TROPONINI in the last 168 hours. BNP (last 3 results) No results for input(s): PROBNP in the last 8760 hours. HbA1C: No results for input(s): HGBA1C in the last 72 hours. CBG: Recent Labs  Lab 10/21/19 1150  GLUCAP 77   Lipid Profile: No results for input(s): CHOL, HDL, LDLCALC, TRIG, CHOLHDL, LDLDIRECT in the last 72 hours. Thyroid Function Tests: No results for input(s): TSH, T4TOTAL, FREET4, T3FREE, THYROIDAB in the last 72 hours. Anemia Panel: No results for input(s): VITAMINB12, FOLATE, FERRITIN, TIBC, IRON, RETICCTPCT in the last 72 hours. Urine analysis:    Component Value Date/Time   COLORURINE AMBER (A) 10/19/2019 1937   APPEARANCEUR HAZY (A) 10/19/2019 1937   LABSPEC 1.026 10/19/2019 1937   PHURINE 5.0 10/19/2019 1937   GLUCOSEU NEGATIVE 10/19/2019 1937   HGBUR NEGATIVE 10/19/2019 1937   BILIRUBINUR NEGATIVE 10/19/2019 1937   KETONESUR NEGATIVE 10/19/2019 1937   PROTEINUR NEGATIVE 10/19/2019 1937   NITRITE NEGATIVE 10/19/2019 1937   LEUKOCYTESUR NEGATIVE 10/19/2019 1937   Sepsis Labs: @LABRCNTIP (procalcitonin:4,lacticidven:4)  ) Recent Results (from the past 240 hour(s))  Urine culture     Status: None    Collection Time: 10/19/19  7:37 PM   Specimen: Urine, Random  Result Value Ref Range Status   Specimen Description URINE, RANDOM  Final   Special Requests NONE  Final   Culture   Final    NO GROWTH Performed at [facility], [address], [place], [place]    Report Status 10/21/2019 FINAL  Final  Gastrointestinal Panel by PCR , Stool     Status: None   Collection Time: 10/19/19  7:37 PM   Specimen: Urine, Catheterized; Stool  Result Value Ref Range Status   Campylobacter species NOT DETECTED NOT DETECTED Final   Plesimonas shigelloides NOT DETECTED NOT DETECTED Final   Salmonella species NOT DETECTED NOT DETECTED Final   Yersinia enterocolitica NOT DETECTED NOT DETECTED Final   Vibrio species NOT DETECTED NOT DETECTED Final   Vibrio cholerae NOT DETECTED NOT DETECTED Final   Enteroaggregative E coli (EAEC) NOT DETECTED NOT DETECTED Final   Enteropathogenic E coli (EPEC) NOT DETECTED NOT DETECTED Final   Enterotoxigenic E coli (ETEC) NOT DETECTED NOT DETECTED Final  Shiga like toxin producing E coli (STEC) NOT DETECTED NOT DETECTED Final   Shigella/Enteroinvasive E coli (EIEC) NOT DETECTED NOT DETECTED Final   Cryptosporidium NOT DETECTED NOT DETECTED Final   Cyclospora cayetanensis NOT DETECTED NOT DETECTED Final   Entamoeba histolytica NOT DETECTED NOT DETECTED Final   Giardia lamblia NOT DETECTED NOT DETECTED Final   Adenovirus F40/41 NOT DETECTED NOT DETECTED Final   Astrovirus NOT DETECTED NOT DETECTED Final   Norovirus GI/GII NOT DETECTED NOT DETECTED Final   Rotavirus A NOT DETECTED NOT DETECTED Final   Sapovirus (I, II, IV, and V) NOT DETECTED NOT DETECTED Final    Comment: Performed at [facility], [address] [place], [place] [address]  C Difficile Quick Screen w PCR reflex     Status: None   Collection Time: 10/19/19  7:37 PM   Specimen: Urine, Catheterized; Stool  Result Value Ref Range Status   C Diff antigen NEGATIVE NEGATIVE Final    C Diff toxin NEGATIVE NEGATIVE Final   C Diff interpretation No C. difficile detected.  Final    Comment: Performed at [facility], [address], [place], [place] [address]  Culture, blood (routine x 2)     Status: None (Preliminary result)   Collection Time: 10/19/19  8:51 PM   Specimen: BLOOD  Result Value Ref Range Status   Specimen Description BLOOD LEFT ANTECUBITAL  Final   Special Requests   Final    BOTTLES DRAWN AEROBIC AND ANAEROBIC Blood Culture adequate volume   Culture   Final    NO GROWTH 2 DAYS Performed at [facility], [address], [place], [place] [address]    Report Status PENDING  Incomplete  Culture, blood (routine x 2)     Status: None (Preliminary result)   Collection Time: 10/19/19  9:12 PM   Specimen: BLOOD RIGHT HAND  Result Value Ref Range Status   Specimen Description BLOOD RIGHT HAND  Final   Special Requests   Final    BOTTLES DRAWN AEROBIC AND ANAEROBIC Blood Culture results may not be optimal due to an inadequate volume of blood received in culture bottles   Culture   Final    NO GROWTH 2 DAYS Performed at [facility], [address] [place], [place] [address]    Report Status PENDING  Incomplete  SARS Coronavirus 2 by RT PCR (hospital order, performed in [facility] hospital lab) Nasopharyngeal Nasopharyngeal Swab     Status: None   Collection Time: 10/20/19  1:55 AM   Specimen: Nasopharyngeal Swab  Result Value Ref Range Status   SARS Coronavirus 2 NEGATIVE NEGATIVE Final    Comment: (NOTE) SARS-CoV-2 target nucleic acids are NOT DETECTED.  The SARS-CoV-2 RNA is generally detectable in upper and lower respiratory specimens during the acute phase of infection. The lowest concentration of SARS-CoV-2 viral copies this assay can detect is 250 copies / mL. A negative result does not preclude SARS-CoV-2 infection and should not be used as the sole basis for treatment or other patient management decisions.  A negative  result may occur with improper specimen collection / handling, submission of specimen other than nasopharyngeal swab, presence of viral mutation(s) within the areas targeted by this assay, and inadequate number of viral copies (<250 copies / mL). A negative result must be combined with clinical observations, patient history, and epidemiological information.  Fact Sheet for Patients:   BoilerBrush.com.cy  Fact Sheet for Healthcare Providers: https://pope.com/  This test  is not yet approved or  cleared by the [place] FDA and has been authorized for detection and/or diagnosis of SARS-CoV-2 by FDA under an Emergency Use Authorization (EUA).  This EUA will remain in effect (meaning this test can be used) for the duration of the COVID-19 declaration under Section 564(b)(1) of the Act, 21 U.S.C. section 360bbb-3(b)(1), unless the authorization is terminated or revoked sooner.  Performed at [facility], [address] [place], [place] [address]          Radiology Studies: DG Chest 2 View  Result Date: 10/19/2019 CLINICAL DATA:  54 year old male with fever and cough. EXAM: CHEST - 2 VIEW COMPARISON:  Chest radiograph dated 12/25/2018. FINDINGS: There is shallow inspiration. No focal consolidation, pleural effusion, pneumothorax. Stable cardiac silhouette. No acute osseous pathology. A shunt catheter appears in similar position. IMPRESSION: No active cardiopulmonary disease. Electronically Signed   By: Elgie CollardArash  Radparvar M.D.   On: 10/19/2019 19:17   CT Abdomen Pelvis W Contrast  Result Date: 10/19/2019 CLINICAL DATA:  Diarrhea since taking antibiotics for recurrent UTI. Wound on buttock. Abdominal abscess/infection suspected EXAM: CT ABDOMEN AND PELVIS WITH CONTRAST TECHNIQUE: Multidetector CT imaging of the abdomen and pelvis was performed using the standard protocol following bolus administration of intravenous contrast.  CONTRAST:  100mL OMNIPAQUE IOHEXOL 300 MG/ML  SOLN COMPARISON:  CT 09/28/2019 FINDINGS: Lower chest: Hypoventilatory changes without confluent airspace disease. Mild distal esophageal wall thickening. Hepatobiliary: Decreased hepatic density consistent with steatosis. No discrete focal lesion. Decompressed gallbladder with multiple gallstones. No pericholecystic inflammation. No biliary dilatation. Pancreas: No ductal dilatation or inflammation. Spleen: Normal in size without focal abnormality. Adrenals/Urinary Tract: Normal adrenal glands. No hydronephrosis. There is early excretion of IV contrast from both renal collecting systems on portal venous phase. No significant perinephric edema. Decompressed urinary bladder which is minimally thick walled. Stomach/Bowel: Mild distal esophageal wall thickening. Stomach unremarkable. There is no small bowel obstruction or inflammation. Motion limits portions of small bowel assessment. Normal appendix. There is submucosal fatty infiltration involving the ascending and transverse colon with mild adjacent pericolonic edema. Liquid stool in the descending and scattered throughout the sigmoid colon. No significant colonic wall thickening. Scattered colonic diverticula without diverticulitis. Vascular/Lymphatic: Normal caliber abdominal aorta with minimal atherosclerosis. The portal vein is patent. No acute vascular findings. There multiple small retroperitoneal nodes are not enlarged by size criteria. An enlarged left inguinal node measuring 17 mm short axis, unchanged from prior exam and demonstrates normal lymph node morphology with retained fatty hila, likely reactive. There are few prominent left external iliac nodes. Reproductive: Prostate is unremarkable. Other: Mild skin thickening and soft tissue edema along the inferior gluteal crease. No definite subcutaneous fluid collection. Shunt catheter tubing in the pelvis appears abandoned. This is discontinuous with  subcutaneous catheter that is intermittently seen in the lower chest and upper abdominal wall. No intra-abdominal fluid collection or ascites. No free air. Small fat containing umbilical hernia. Musculoskeletal: Stable chronic changes of spinal dysraphism. Chronic deformity of the right proximal femur and acetabulum. Multilevel anterior osteophytes at the lumbar spine. There are no acute or suspicious osseous abnormalities. IMPRESSION: 1. Mild skin thickening and soft tissue edema along the inferior gluteal crease, suspicious for cellulitis. No subcutaneous fluid collection. 2. Submucosal fatty infiltration involving the ascending and transverse colon with mild adjacent pericolonic edema, suggesting colitis. Liquid stool in the descending and sigmoid colon, consistent with diarrheal illness. 3. Mild distal esophageal wall thickening, can be seen with reflux or esophagitis. 4.  Hepatic steatosis. 5. Cholelithiasis without gallbladder inflammation. 6. Minimal colonic diverticulosis without diverticulitis. Aortic Atherosclerosis (ICD10-I70.0). Electronically Signed   By: Narda Rutherford M.D.   On: 10/19/2019 21:59        Scheduled Meds: . atenolol  25 mg Oral BID  . cholecalciferol  2,000 Units Oral Daily  . enoxaparin (LOVENOX) injection  40 mg Subcutaneous Q24H  . Gerhardt's butt cream   Topical 6 X Daily  . lactobacillus acidophilus  2 tablet Oral Daily  . levETIRAcetam  1,500 mg Oral BID  . lisinopril  5 mg Oral Daily  . nystatin cream   Topical BID  . pantoprazole  40 mg Oral QHS   Continuous Infusions: . cefTRIAXone (ROCEPHIN)  IV 2 g (10/21/19 1234)  . lactated ringers 75 mL/hr at 10/21/19 0921  . metronidazole 500 mg (10/21/19 1129)     LOS: 1 day    Time spent:  Zannie Cove, MD [facility] 10/21/2019, 12:41 PM

## 2019-10-22 LAB — CBC
HCT: 33.3 % — ABNORMAL LOW (ref 39.0–52.0)
Hemoglobin: 10.6 g/dL — ABNORMAL LOW (ref 13.0–17.0)
MCH: 29.8 pg (ref 26.0–34.0)
MCHC: 31.8 g/dL (ref 30.0–36.0)
MCV: 93.5 fL (ref 80.0–100.0)
Platelets: 291 10*3/uL (ref 150–400)
RBC: 3.56 MIL/uL — ABNORMAL LOW (ref 4.22–5.81)
RDW: 14 % (ref 11.5–15.5)
WBC: 5.1 10*3/uL (ref 4.0–10.5)
nRBC: 0 % (ref 0.0–0.2)

## 2019-10-22 LAB — BASIC METABOLIC PANEL
Anion gap: 9 (ref 5–15)
BUN: 9 mg/dL (ref 6–20)
CO2: 22 mmol/L (ref 22–32)
Calcium: 8.3 mg/dL — ABNORMAL LOW (ref 8.9–10.3)
Chloride: 111 mmol/L (ref 98–111)
Creatinine, Ser: 0.75 mg/dL (ref 0.61–1.24)
GFR calc Af Amer: >60 mL/min (ref >60)
GFR calc non Af Amer: >60 mL/min (ref >60)
Glucose, Bld: 125 mg/dL — ABNORMAL HIGH (ref 70–99)
Potassium: 4.6 mmol/L (ref 3.5–5.1)
Sodium: 142 mmol/L (ref 135–145)

## 2019-10-22 MED ORDER — LOPERAMIDE HCL 2 MG PO CAPS
2.0000 mg | ORAL_CAPSULE | Freq: Every day | ORAL | Status: AC | PRN
  Administered 2019-10-22: 2 mg via ORAL
  Filled 2019-10-22: qty 1

## 2019-10-22 NOTE — Plan of Care (Signed)
°  Problem: Coping: °Goal: Level of anxiety will decrease °Outcome: Progressing °  °

## 2019-10-22 NOTE — Progress Notes (Signed)
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 -approximately 5 weeks or so ago he was started on antibiotic therapy for suspected catheter associated urinary tract infection.  Around this time, patient states that he began to develop diarrhea.  Patient explains that this diarrhea is watery with occasional small amounts of blood.  Diarrhea occurs up to 15 times daily. -In the weeks that followed, patient was placed on courses of both Bactrim and nitrofurantoin for suspected urinary tract infection.   -Presented to [facility] emergency room, work-up in the ED noted concern for ascending and transverse colitis, also noted to have low-grade temp of 100.5 and a white count 13.9, in addition noted to have full-thickness wounds on his bottom with tissue loss   Assessment & Plan:   Colitis Subacute diarrhea -CT did note colitis of ascending and transverse colon -C. difficile PCR screen negative x1, repeat ordered and canceled by the lab -GI pathogen panel was negative  -He is afebrile and nontoxic without leukocytosis at this time, trial of Imodium -Continue IV ceftriaxone and Flagyl, day 3 -Cut down IV fluids, advance diet -May need gastroenterology work-up for chronic diarrhea if this persists  Multiple full-thickness buttock wounds with tissue loss -Present on admission -Worsened in the setting of ongoing diarrhea, MASD -Continue Flexi-Seal -Attempt to keep this clean and dry -Does not appear actively infected, DC IV vancomycin -Continue local care, appreciate  wound care consult  Essential hypertension -Continue home regimen of atenolol  Seizure disorder Spina bifida -Continue Keppra  Neurogenic bladder -Continue self-catheterization per home regimen  Esophagitis -Started PPI  DVT prophylaxis: Lovenox Code Status: Full code Family Communication: No family at bedside Disposition Plan:  Status is: Inpatient  Remains inpatient appropriate because:Inpatient level of care appropriate due to severity of illness   Dispo: The patient is from: Home              Anticipated d/c is to: Home              Anticipated d/c date is: 2-3 days              Patient currently is not medically stable to d/c.  Consultants:    Procedures:   Antimicrobials:    Subjective: -Feels okay overall, continues to have profuse watery diarrhea  Objective: Vitals:   10/21/19 0947 10/21/19 2022 10/22/19 0712 10/22/19 0910  BP: (!) 102/58 120/71 128/75 124/73  Pulse: 69 77 61 67  Resp: 18 20 18 18   Temp:  97.6 F (36.4 C) 97.7 F (36.5 C) 98 F (36.7 C)  TempSrc:  Oral Oral Oral  SpO2: 100% 99% 99% 100%  Weight:      Height:        Intake/Output Summary (Last 24 hours) at 10/22/2019 1205 Last data filed at 10/22/2019 10/24/2019 Gross per 24 hour  Intake 1905.64 ml  Output 750 ml  Net 1155.64 ml   Filed Weights   10/20/19 0542  Weight: 69.4 kg    Examination:  General exam: Pleasant small statured male laying in bed, AAOx3, no distress HEENT: No JVD CVS: S1-S2, regular rate rhythm Lungs: Decreased to the bases,  otherwise clear Abdomen: Soft, nontender, bowel sounds increased  Extremities are atrophied and short statured respiratory system,  No edema Skin: Wounds to his left knee and buttocks/thighs Psychiatry:Mood & affect appropriate.     Data Reviewed:   CBC: Recent Labs  Lab 10/19/19 1721 10/21/19 0326 10/22/19 0416  WBC 13.9* 5.6 5.1  NEUTROABS 11.8* 3.8  --   HGB 11.5* 10.8* 10.6*  HCT 36.5* 34.0* 33.3*  MCV 93.6 93.4  93.5  PLT 369 263 291   Basic Metabolic Panel: Recent Labs  Lab 10/19/19 1721 10/21/19 0326 10/22/19 0416  NA 136 141 142  K 4.4 4.4 4.6  CL 102 107 111  CO2 25 21* 22  GLUCOSE 111* 95 125*  BUN 17 6 9   CREATININE 0.94 0.71 0.75  CALCIUM 8.9 8.6* 8.3*  MG 2.3 2.1  --    GFR: Estimated Creatinine Clearance: 88.3 mL/min (by C-G formula based on SCr of 0.75 mg/dL). Liver Function Tests: Recent Labs  Lab 10/19/19 1721 10/21/19 0326  AST 28 15  ALT 53* 29  ALKPHOS 106 66  BILITOT 0.5 0.6  PROT 7.4 6.2*  ALBUMIN 3.0* 2.4*   No results for input(s): LIPASE, AMYLASE in the last 168 hours. No results for input(s): AMMONIA in the last 168 hours. Coagulation Profile: No results for input(s): INR, PROTIME in the last 168 hours. Cardiac Enzymes: No results for input(s): CKTOTAL, CKMB, CKMBINDEX, TROPONINI in the last 168 hours. BNP (last 3 results) No results for input(s): PROBNP in the last 8760 hours. HbA1C: No results for input(s): HGBA1C in the last 72 hours. CBG: Recent Labs  Lab 10/21/19 1150  GLUCAP 77   Lipid Profile: No results for input(s): CHOL, HDL, LDLCALC, TRIG, CHOLHDL, LDLDIRECT in the last 72 hours. Thyroid Function Tests: No results for input(s): TSH, T4TOTAL, FREET4, T3FREE, THYROIDAB in the last 72 hours. Anemia Panel: No results for input(s): VITAMINB12, FOLATE, FERRITIN, TIBC, IRON, RETICCTPCT in the last 72 hours. Urine analysis:    Component Value Date/Time   COLORURINE AMBER (A) 10/19/2019 1937   APPEARANCEUR HAZY (A) 10/19/2019 1937   LABSPEC 1.026 10/19/2019 1937   PHURINE 5.0 10/19/2019 1937   GLUCOSEU NEGATIVE 10/19/2019 1937   HGBUR NEGATIVE 10/19/2019 1937   BILIRUBINUR NEGATIVE 10/19/2019 1937   KETONESUR NEGATIVE 10/19/2019 1937   PROTEINUR NEGATIVE 10/19/2019 1937   NITRITE NEGATIVE 10/19/2019 1937   LEUKOCYTESUR NEGATIVE 10/19/2019 1937   Sepsis Labs: @LABRCNTIP (procalcitonin:4,lacticidven:4)  ) Recent Results (from the  past 240 hour(s))  Urine culture     Status: None   Collection Time: 10/19/19  7:37 PM   Specimen: Urine, Random  Result Value Ref Range Status   Specimen Description URINE, RANDOM  Final   Special Requests NONE  Final   Culture   Final    NO GROWTH Performed at [facility], [address] [place], [address] [place]    Report Status 10/21/2019 FINAL  Final  Gastrointestinal Panel by PCR , Stool     Status: None   Collection Time: 10/19/19  7:37 PM   Specimen: Urine, Catheterized; Stool  Result Value Ref Range Status   Campylobacter species NOT DETECTED NOT DETECTED Final   Plesimonas shigelloides NOT DETECTED NOT DETECTED Final   Salmonella species NOT DETECTED NOT DETECTED Final   Yersinia enterocolitica NOT DETECTED NOT DETECTED Final   Vibrio species NOT DETECTED NOT DETECTED Final   Vibrio cholerae NOT DETECTED NOT DETECTED Final   Enteroaggregative E coli (EAEC) NOT DETECTED NOT  DETECTED Final   Enteropathogenic E coli (EPEC) NOT DETECTED NOT DETECTED Final   Enterotoxigenic E coli (ETEC) NOT DETECTED NOT DETECTED Final   Shiga like toxin producing E coli (STEC) NOT DETECTED NOT DETECTED Final   Shigella/Enteroinvasive E coli (EIEC) NOT DETECTED NOT DETECTED Final   Cryptosporidium NOT DETECTED NOT DETECTED Final   Cyclospora cayetanensis NOT DETECTED NOT DETECTED Final   Entamoeba histolytica NOT DETECTED NOT DETECTED Final   Giardia lamblia NOT DETECTED NOT DETECTED Final   Adenovirus F40/41 NOT DETECTED NOT DETECTED Final   Astrovirus NOT DETECTED NOT DETECTED Final   Norovirus GI/GII NOT DETECTED NOT DETECTED Final   Rotavirus A NOT DETECTED NOT DETECTED Final   Sapovirus (I, II, IV, and V) NOT DETECTED NOT DETECTED Final    Comment: Performed at [facility], [address] [place], [place] [address]  C Difficile Quick Screen w PCR reflex     Status: None   Collection Time: 10/19/19  7:37 PM   Specimen: Urine, Catheterized; Stool  Result Value Ref  Range Status   C Diff antigen NEGATIVE NEGATIVE Final   C Diff toxin NEGATIVE NEGATIVE Final   C Diff interpretation No C. difficile detected.  Final    Comment: Performed at [facility], [place] [address], [place], [place] [address]  Culture, blood (routine x 2)     Status: None (Preliminary result)   Collection Time: 10/19/19  8:51 PM   Specimen: BLOOD  Result Value Ref Range Status   Specimen Description BLOOD LEFT ANTECUBITAL  Final   Special Requests   Final    BOTTLES DRAWN AEROBIC AND ANAEROBIC Blood Culture adequate volume   Culture   Final    NO GROWTH 3 DAYS Performed at [facility], [place] [address], [place], [place] [address]    Report Status PENDING  Incomplete  Culture, blood (routine x 2)     Status: None (Preliminary result)   Collection Time: 10/19/19  9:12 PM   Specimen: BLOOD RIGHT HAND  Result Value Ref Range Status   Specimen Description BLOOD RIGHT HAND  Final   Special Requests   Final    BOTTLES DRAWN AEROBIC AND ANAEROBIC Blood Culture results may not be optimal due to an inadequate volume of blood received in culture bottles   Culture   Final    NO GROWTH 3 DAYS Performed at [facility], [place] [address], [place], [place] [address]    Report Status PENDING  Incomplete  SARS Coronavirus 2 by RT PCR (hospital order, performed in [facility] hospital lab) Nasopharyngeal Nasopharyngeal Swab     Status: None   Collection Time: 10/20/19  1:55 AM   Specimen: Nasopharyngeal Swab  Result Value Ref Range Status   SARS Coronavirus 2 NEGATIVE NEGATIVE Final    Comment: (NOTE) SARS-CoV-2 target nucleic acids are NOT DETECTED.  The SARS-CoV-2 RNA is generally detectable in upper and lower respiratory specimens during the acute phase of infection. The lowest concentration of SARS-CoV-2 viral copies this assay can detect is 250 copies / mL. A negative result does not preclude SARS-CoV-2 infection and should not be used as the sole basis for treatment  or other patient management decisions.  A negative result may occur with improper specimen collection / handling, submission of specimen other than nasopharyngeal swab, presence of viral mutation(s) within the areas targeted by this assay, and inadequate number of viral copies (<250 copies / mL). A negative result must be combined  with clinical observations, patient history, and epidemiological information.  Fact Sheet for Patients:   BoilerBrush.com.cy  Fact Sheet for Healthcare Providers: https://pope.com/  This test is not yet approved or  cleared by the [place] FDA and has been authorized for detection and/or diagnosis of SARS-CoV-2 by FDA under an Emergency Use Authorization (EUA).  This EUA will remain in effect (meaning this test can be used) for the duration of the COVID-19 declaration under Section 564(b)(1) of the Act, 21 U.S.C. section 360bbb-3(b)(1), unless the authorization is terminated or revoked sooner.  Performed at [facility], [address] [place], [place] [address]          Radiology Studies: No results found.      Scheduled Meds: . atenolol  25 mg Oral BID  . cholecalciferol  2,000 Units Oral Daily  . enoxaparin (LOVENOX) injection  40 mg Subcutaneous Q24H  . Gerhardt's butt cream   Topical 6 X Daily  . lactobacillus acidophilus  2 tablet Oral Daily  . levETIRAcetam  1,500 mg Oral BID  . lisinopril  5 mg Oral Daily  . nystatin cream   Topical BID  . pantoprazole  40 mg Oral QHS   Continuous Infusions: . cefTRIAXone (ROCEPHIN)  IV 2 g (10/22/19 1113)  . metronidazole 500 mg (10/22/19 1122)     LOS: 2 days    Time spent:  Zannie Cove, MD [facility] 10/22/2019, 12:05 PM

## 2019-10-23 LAB — CBC
HCT: 39.3 % (ref 39.0–52.0)
Hemoglobin: 13 g/dL (ref 13.0–17.0)
MCH: 31 pg (ref 26.0–34.0)
MCHC: 33.1 g/dL (ref 30.0–36.0)
MCV: 93.6 fL (ref 80.0–100.0)
Platelets: 383 10*3/uL (ref 150–400)
RBC: 4.2 MIL/uL — ABNORMAL LOW (ref 4.22–5.81)
RDW: 14.1 % (ref 11.5–15.5)
WBC: 6 10*3/uL (ref 4.0–10.5)
nRBC: 0 % (ref 0.0–0.2)

## 2019-10-23 LAB — BASIC METABOLIC PANEL
Anion gap: 10 (ref 5–15)
BUN: 9 mg/dL (ref 6–20)
CO2: 21 mmol/L — ABNORMAL LOW (ref 22–32)
Calcium: 8.7 mg/dL — ABNORMAL LOW (ref 8.9–10.3)
Chloride: 111 mmol/L (ref 98–111)
Creatinine, Ser: 0.85 mg/dL (ref 0.61–1.24)
GFR calc Af Amer: >60 mL/min (ref >60)
GFR calc non Af Amer: >60 mL/min (ref >60)
Glucose, Bld: 114 mg/dL — ABNORMAL HIGH (ref 70–99)
Potassium: 4.1 mmol/L (ref 3.5–5.1)
Sodium: 142 mmol/L (ref 135–145)

## 2019-10-23 MED ORDER — LOPERAMIDE HCL 2 MG PO CAPS
2.0000 mg | ORAL_CAPSULE | Freq: Every day | ORAL | Status: AC | PRN
  Administered 2019-10-24: 2 mg via ORAL
  Filled 2019-10-23: qty 1

## 2019-10-23 NOTE — Progress Notes (Signed)
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 -approximately 5 weeks or so ago he was started on antibiotic therapy for suspected catheter associated urinary tract infection.  Around this time, patient states that he began to develop diarrhea.  Patient explains that this diarrhea is watery with occasional small amounts of blood.  Diarrhea occurs up to 15 times daily. -In the weeks that followed, patient was placed on courses of both Bactrim and nitrofurantoin for suspected urinary tract infection.   -Presented to [facility] emergency room, work-up in the ED noted concern for ascending and transverse colitis, also noted to have low-grade temp of 100.5 and a white count 13.9, in addition noted to have full-thickness wounds on his bottom with tissue loss  Assessment & Plan:   Colitis Subacute diarrhea -CT did note colitis of ascending and transverse colon -C. difficile PCR screen negative x1, repeat ordered and canceled by the lab -GI pathogen panel was negative  -He is afebrile and nontoxic without leukocytosis at this time -Repeat Imodium today -Continue IV ceftriaxone and Flagyl, day 4 of antibiotics -Tolerating diet, continue Flexi-Seal given sacral wounds -May need gastroenterology work-up for chronic diarrhea if this persists  Multiple full-thickness buttock wounds with tissue loss -Present on admission -Worsened in the setting of ongoing diarrhea, MASD -Continue Flexi-Seal -Attempt to keep this clean and dry -Does not appear actively infected, DC  IV vancomycin -Continue local care, appreciate wound care consult  Essential hypertension -Continue home regimen of atenolol  Seizure disorder Spina bifida -Continue Keppra  Neurogenic bladder -Continue self-catheterization per home regimen  Esophagitis -Started PPI  DVT prophylaxis: Lovenox Code Status: Full code Family Communication: No family at bedside Disposition Plan:  Status is: Inpatient  Remains inpatient appropriate because:Inpatient level of care appropriate due to severity of illness, Home pending improvement in diarrhea, patient is physically limited by his spina bifida and disabilities with sacral wounds and ongoing diarrhea   Dispo: The patient is from: Home              Anticipated d/c is to: Home              Anticipated d/c date is: 2-3 days              Patient currently is not medically stable to d/c.  Consultants:    Procedures:   Antimicrobials:    Subjective: -Feels better, unfortunately continues to have diarrhea  Objective: Vitals:   10/22/19 0910 10/22/19 1646 10/22/19 2119 10/23/19 0449  BP: 124/73 118/72 129/60 125/66  Pulse: 67 68 (!) 58 (!) 56  Resp: 18 18 15 17   Temp: 98 F (36.7 C) 97.8 F (36.6 C) 97.9 F (36.6 C) 98.4 F (36.9 C)  TempSrc: Oral Oral Oral Oral  SpO2: 100% 99% 100% 98%  Weight:      Height:        Intake/Output Summary (Last 24 hours) at 10/23/2019 1027 Last data filed at 10/23/2019 0856 Gross per 24 hour  Intake 1155.28 ml  Output 1200 ml  Net -44.72 ml   Filed Weights   10/20/19 0542  Weight: 69.4 kg    Examination:  General exam: Pleasant small statured male laying in bed, AAOx3, no distress HEENT: No JVD CVS: S1-S2, regular rate rhythm Lungs: Diminished breath sounds the bases, otherwise clear Abdomen: Soft, nontender, bowel sounds present Extremities: atrophied and short statured respiratory system,  No edema Skin: Wounds to his left knee and buttocks/thighs Psychiatry:Mood & affect  appropriate.     Data Reviewed:   CBC: Recent Labs  Lab 10/19/19 1721 10/21/19 0326 10/22/19 0416 10/23/19 0507  WBC 13.9* 5.6 5.1 6.0  NEUTROABS 11.8* 3.8  --   --   HGB 11.5* 10.8* 10.6* 13.0  HCT 36.5* 34.0* 33.3* 39.3  MCV 93.6 93.4 93.5 93.6  PLT 369 263 291 829   Basic Metabolic Panel: Recent Labs  Lab 10/19/19 1721 10/21/19 0326 10/22/19 0416 10/23/19 0507  NA 136 141 142 142  K 4.4 4.4 4.6 4.1  CL 102 107 111 111  CO2 25 21* 22 21*  GLUCOSE 111* 95 125* 114*  BUN 17 6 9 9   CREATININE 0.94 0.71 0.75 0.85  CALCIUM 8.9 8.6* 8.3* 8.7*  MG 2.3 2.1  --   --    GFR: Estimated Creatinine Clearance: 83.1 mL/min (by C-G formula based on SCr of 0.85 mg/dL). Liver Function Tests: Recent Labs  Lab 10/19/19 1721 10/21/19 0326  AST 28 15  ALT 53* 29  ALKPHOS 106 66  BILITOT 0.5 0.6  PROT 7.4 6.2*  ALBUMIN 3.0* 2.4*   No results for input(s): LIPASE, AMYLASE in the last 168 hours. No results for input(s): AMMONIA in the last 168 hours. Coagulation Profile: No results for input(s): INR, PROTIME in the last 168 hours. Cardiac Enzymes: No results for input(s): CKTOTAL, CKMB, CKMBINDEX, TROPONINI in the last 168 hours. BNP (last 3 results) No results for input(s): PROBNP in the last 8760 hours. HbA1C: No results for input(s): HGBA1C in the last 72 hours. CBG: Recent Labs  Lab 10/21/19 1150  GLUCAP 77   Lipid Profile: No results for input(s): CHOL, HDL, LDLCALC, TRIG, CHOLHDL, LDLDIRECT in the last 72 hours. Thyroid Function Tests: No results for input(s): TSH, T4TOTAL, FREET4, T3FREE, THYROIDAB in the last 72 hours. Anemia Panel: No results for input(s): VITAMINB12, FOLATE, FERRITIN, TIBC, IRON, RETICCTPCT in the last 72 hours. Urine analysis:    Component Value Date/Time   COLORURINE AMBER (A) 10/19/2019 1937   APPEARANCEUR HAZY (A) 10/19/2019 1937   LABSPEC 1.026 10/19/2019 1937   PHURINE 5.0 10/19/2019 1937   GLUCOSEU NEGATIVE 10/19/2019 1937    HGBUR NEGATIVE 10/19/2019 1937   BILIRUBINUR NEGATIVE 10/19/2019 [place] NEGATIVE 10/19/2019 1937   PROTEINUR NEGATIVE 10/19/2019 1937   NITRITE NEGATIVE 10/19/2019 1937   LEUKOCYTESUR NEGATIVE 10/19/2019 1937   Sepsis Labs: @LABRCNTIP (procalcitonin:4,lacticidven:4)  ) Recent Results (from the past 240 hour(s))  Urine culture     Status: None   Collection Time: 10/19/19  7:37 PM   Specimen: Urine, Random  Result Value Ref Range Status   Specimen Description URINE, RANDOM  Final   Special Requests NONE  Final   Culture   Final    NO GROWTH Performed at [facility], [place] [address] [place], [place] [address]    Report Status 10/21/2019 FINAL  Final  Gastrointestinal Panel by PCR , Stool     Status: None   Collection Time: 10/19/19  7:37 PM   Specimen: Urine, Catheterized; Stool  Result Value Ref Range Status   Campylobacter species NOT DETECTED NOT DETECTED Final   Plesimonas shigelloides NOT DETECTED NOT  DETECTED Final   Salmonella species NOT DETECTED NOT DETECTED Final   Yersinia enterocolitica NOT DETECTED NOT DETECTED Final   Vibrio species NOT DETECTED NOT DETECTED Final   Vibrio cholerae NOT DETECTED NOT DETECTED Final   Enteroaggregative E coli (EAEC) NOT DETECTED NOT DETECTED Final   Enteropathogenic E coli (EPEC) NOT DETECTED NOT DETECTED Final   Enterotoxigenic E coli (ETEC) NOT DETECTED NOT DETECTED Final   Shiga like toxin producing E coli (STEC) NOT DETECTED NOT DETECTED Final   Shigella/Enteroinvasive E coli (EIEC) NOT DETECTED NOT DETECTED Final   Cryptosporidium NOT DETECTED NOT DETECTED Final   Cyclospora cayetanensis NOT DETECTED NOT DETECTED Final   Entamoeba histolytica NOT DETECTED NOT DETECTED Final   Giardia lamblia NOT DETECTED NOT DETECTED Final   Adenovirus F40/41 NOT DETECTED NOT DETECTED Final   Astrovirus NOT DETECTED NOT DETECTED Final   Norovirus GI/GII NOT DETECTED NOT DETECTED Final   Rotavirus A NOT DETECTED NOT DETECTED  Final   Sapovirus (I, II, IV, and V) NOT DETECTED NOT DETECTED Final    Comment: Performed at [facility], [address] [place], [place] [address]  C Difficile Quick Screen w PCR reflex     Status: None   Collection Time: 10/19/19  7:37 PM   Specimen: Urine, Catheterized; Stool  Result Value Ref Range Status   C Diff antigen NEGATIVE NEGATIVE Final   C Diff toxin NEGATIVE NEGATIVE Final   C Diff interpretation No C. difficile detected.  Final    Comment: Performed at [facility], [address], [place], [place] [address]  Culture, blood (routine x 2)     Status: None (Preliminary result)   Collection Time: 10/19/19  8:51 PM   Specimen: BLOOD  Result Value Ref Range Status   Specimen Description BLOOD LEFT ANTECUBITAL  Final   Special Requests   Final    BOTTLES DRAWN AEROBIC AND ANAEROBIC Blood Culture adequate volume   Culture   Final    NO GROWTH 3 DAYS Performed at [facility], [address] [place], [place] [address]    Report Status PENDING  Incomplete  Culture, blood (routine x 2)     Status: None (Preliminary result)   Collection Time: 10/19/19  9:12 PM   Specimen: BLOOD RIGHT HAND  Result Value Ref Range Status   Specimen Description BLOOD RIGHT HAND  Final   Special Requests   Final    BOTTLES DRAWN AEROBIC AND ANAEROBIC Blood Culture results may not be optimal due to an inadequate volume of blood received in culture bottles   Culture   Final    NO GROWTH 3 DAYS Performed at [facility], [address], [place], [place] [address]    Report Status PENDING  Incomplete  SARS Coronavirus 2 by RT PCR (hospital order, performed in [facility] hospital lab) Nasopharyngeal Nasopharyngeal Swab     Status: None   Collection Time: 10/20/19  1:55 AM   Specimen: Nasopharyngeal Swab  Result Value Ref Range Status   SARS Coronavirus 2 NEGATIVE NEGATIVE Final    Comment: (NOTE) SARS-CoV-2 target nucleic acids are NOT DETECTED.  The SARS-CoV-2  RNA is generally detectable in upper and lower respiratory specimens during the acute phase of infection. The lowest concentration of SARS-CoV-2 viral copies this assay can detect is 250 copies / mL. A negative result does not preclude SARS-CoV-2 infection and should not be used as the sole basis for treatment or other patient management  decisions.  A negative result may occur with improper specimen collection / handling, submission of specimen other than nasopharyngeal swab, presence of viral mutation(s) within the areas targeted by this assay, and inadequate number of viral copies (<250 copies / mL). A negative result must be combined with clinical observations, patient history, and epidemiological information.  Fact Sheet for Patients:   BoilerBrush.com.cy  Fact Sheet for Healthcare Providers: https://pope.com/  This test is not yet approved or  cleared by the [place] FDA and has been authorized for detection and/or diagnosis of SARS-CoV-2 by FDA under an Emergency Use Authorization (EUA).  This EUA will remain in effect (meaning this test can be used) for the duration of the COVID-19 declaration under Section 564(b)(1) of the Act, 21 U.S.C. section 360bbb-3(b)(1), unless the authorization is terminated or revoked sooner.  Performed at [facility], [address] [place], [place] [address]          Radiology Studies: No results found.      Scheduled Meds: . atenolol  25 mg Oral BID  . cholecalciferol  2,000 Units Oral Daily  . enoxaparin (LOVENOX) injection  40 mg Subcutaneous Q24H  . Gerhardt's butt cream   Topical 6 X Daily  . lactobacillus acidophilus  2 tablet Oral Daily  . levETIRAcetam  1,500 mg Oral BID  . lisinopril  5 mg Oral Daily  . nystatin cream   Topical BID  . pantoprazole  40 mg Oral QHS   Continuous Infusions: . cefTRIAXone (ROCEPHIN)  IV 2 g (10/22/19 1113)  . metronidazole 100  mL/hr at 10/23/19 0600     LOS: 3 days    Time spent:  Zannie Cove, MD [facility] 10/23/2019, 10:27 AM

## 2019-10-24 LAB — BASIC METABOLIC PANEL
Anion gap: 9 (ref 5–15)
BUN: 11 mg/dL (ref 6–20)
CO2: 22 mmol/L (ref 22–32)
Calcium: 8.7 mg/dL — ABNORMAL LOW (ref 8.9–10.3)
Chloride: 112 mmol/L — ABNORMAL HIGH (ref 98–111)
Creatinine, Ser: 0.79 mg/dL (ref 0.61–1.24)
GFR calc Af Amer: >60 mL/min (ref >60)
GFR calc non Af Amer: >60 mL/min (ref >60)
Glucose, Bld: 108 mg/dL — ABNORMAL HIGH (ref 70–99)
Potassium: 4.7 mmol/L (ref 3.5–5.1)
Sodium: 143 mmol/L (ref 135–145)

## 2019-10-24 LAB — CULTURE, BLOOD (ROUTINE X 2)
Culture: NO GROWTH
Culture: NO GROWTH
Special Requests: ADEQUATE

## 2019-10-24 LAB — CBC
HCT: 37.7 % — ABNORMAL LOW (ref 39.0–52.0)
Hemoglobin: 11.9 g/dL — ABNORMAL LOW (ref 13.0–17.0)
MCH: 30.1 pg (ref 26.0–34.0)
MCHC: 31.6 g/dL (ref 30.0–36.0)
MCV: 95.2 fL (ref 80.0–100.0)
Platelets: 301 10*3/uL (ref 150–400)
RBC: 3.96 MIL/uL — ABNORMAL LOW (ref 4.22–5.81)
RDW: 14.4 % (ref 11.5–15.5)
WBC: 7.5 10*3/uL (ref 4.0–10.5)
nRBC: 0 % (ref 0.0–0.2)

## 2019-10-24 MED ORDER — CEFDINIR 300 MG PO CAPS
300.0000 mg | ORAL_CAPSULE | Freq: Two times a day (BID) | ORAL | Status: DC
  Administered 2019-10-24 – 2019-10-26 (×5): 300 mg via ORAL
  Filled 2019-10-24 (×5): qty 1

## 2019-10-24 MED ORDER — METRONIDAZOLE 500 MG PO TABS
250.0000 mg | ORAL_TABLET | Freq: Three times a day (TID) | ORAL | Status: DC
  Administered 2019-10-24 – 2019-10-26 (×6): 250 mg via ORAL
  Filled 2019-10-24 (×6): qty 1

## 2019-10-24 NOTE — Progress Notes (Signed)
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 -approximately 5 weeks or so ago he was started on an antibiotic therapy for suspected catheter associated urinary tract infection.  Around this time, patient states that he began to develop diarrhea.  Patient explains that this diarrhea is watery with occasional small amounts of blood.  Diarrhea occurs up to 15 times daily. -In the weeks that followed, patient was placed on courses of both Bactrim and nitrofurantoin for suspected urinary tract infection.   -Presented to [facility], work-up in the ED noted concern for ascending and transverse colitis, also noted to have low-grade temp of 100.5 and a white count 13.9, in addition noted to have full-thickness wounds on his bottom with tissue loss  Assessment & Plan:   Colitis Subacute diarrhea -CT did note colitis of ascending and transverse colon -C. difficile PCR screen negative x1, repeat ordered and canceled by the lab -GI pathogen panel was negative  -He is afebrile and nontoxic without leukocytosis at this time -Treated with IV ceftriaxone and Flagyl, day 5 today, changed to oral cephalosporin and Flagyl for 2-3 more days -given a dose of imodium yesterday -Diarrhea is improving -Diet advanced, discontinue Flexi-Seal, discussed with RN to monitor stool output/frequency  Multiple full-thickness buttock wounds with tissue loss -Present on admission -Worsened in the setting of ongoing diarrhea, MASD -Treated with Flexi-Seal for the last 5  days, since diarrhea is improving will discontinue this and monitor -Attempt to keep this clean and dry -Continue local care, appreciate wound care consult  Essential hypertension -Continue home regimen of atenolol  Seizure disorder Spina bifida -Continue Keppra  Neurogenic bladder -Continue self-catheterization per home regimen  Esophagitis -Started PPI  DVT prophylaxis: Lovenox Code Status: Full code Family Communication: No family at bedside Disposition Plan:  Status is: Inpatient  Remains inpatient appropriate because:Inpatient level of care appropriate due to severity of illness, Home pending improvement in diarrhea, patient is physically limited by his spina bifida and disabilities with sacral wounds and ongoing diarrhea   Dispo: The patient is from: Home              Anticipated d/c is to: Home              Anticipated d/c date is: 1-2              Patient currently is not medically stable to d/c.  Consultants:    Procedures:   Antimicrobials:    Subjective: -Complains of abdominal distention, no nausea or vomiting, -Diarrhea starting to get better  Objective: Vitals:   10/23/19 0913 10/23/19 2144 10/24/19 0611 10/24/19 0829  BP: 126/68 125/73 (!) 141/61 131/75  Pulse: (!) 59 67 62 72  Resp: 18 18 18 18   Temp: 98 F (36.7 C) 98.9 F (37.2 C) 98 F (36.7 C) 98.1 F (36.7 C)  TempSrc: Oral Oral Oral Oral  SpO2: 99% 97% 100% 100%  Weight:      Height:        Intake/Output Summary (Last 24 hours) at 10/24/2019 1101 Last data filed at 10/24/2019 0900 Gross per 24 hour  Intake 1318.17 ml  Output 750 ml  Net 568.17 ml   Filed Weights   10/20/19 0542  Weight: 69.4 kg    Examination:  General exam: Pleasant small statured male, laying in bed, AAOx3, no distress HEENT: No JVD CVS: S1-S2, regular rate rhythm Lungs: Decreased breath sounds to bases, otherwise clear Abdomen: Soft, mildly distended, nontender, bowel sounds present Extremities:  atrophied and short statured respiratory system,  No edema Skin: Wounds to his left knee and buttocks/thighs Psychiatry:Mood & affect appropriate.     Data Reviewed:   CBC: Recent Labs  Lab 10/19/19 1721 10/21/19 0326 10/22/19 0416 10/23/19 0507 10/24/19 0509  WBC 13.9* 5.6 5.1 6.0 7.5  NEUTROABS 11.8* 3.8  --   --   --   HGB 11.5* 10.8* 10.6* 13.0 11.9*  HCT 36.5* 34.0* 33.3* 39.3 37.7*  MCV 93.6 93.4 93.5 93.6 95.2  PLT 369 263 291 383 301   Basic Metabolic Panel: Recent Labs  Lab 10/19/19 1721 10/21/19 0326 10/22/19 0416 10/23/19 0507 10/24/19 0509  NA 136 141 142 142 143  K 4.4 4.4 4.6 4.1 4.7  CL 102 107 111 111 112*  CO2 25 21* 22 21* 22  GLUCOSE 111* 95 125* 114* 108*  BUN 17 6 9 9 11   CREATININE 0.94 0.71 0.75 0.85 0.79  CALCIUM 8.9 8.6* 8.3* 8.7* 8.7*  MG 2.3 2.1  --   --   --    GFR: Estimated Creatinine Clearance: 88.3 mL/min (by C-G formula based on SCr of 0.79 mg/dL). Liver Function Tests: Recent Labs  Lab 10/19/19 1721 10/21/19 0326  AST 28 15  ALT 53* 29  ALKPHOS 106 66  BILITOT 0.5 0.6  PROT 7.4 6.2*  ALBUMIN 3.0* 2.4*   No results for input(s): LIPASE, AMYLASE in the last 168 hours. No results for input(s): AMMONIA in the last 168 hours. Coagulation Profile: No results for input(s): INR, PROTIME in the last 168 hours. Cardiac Enzymes: No results for input(s): CKTOTAL, CKMB, CKMBINDEX, TROPONINI in the last 168 hours. BNP (last 3 results) No results for input(s): PROBNP in the last 8760 hours. HbA1C: No results for input(s): HGBA1C in the last 72 hours. CBG: Recent Labs  Lab 10/21/19 1150  GLUCAP 77   Lipid Profile: No results for input(s): CHOL, HDL, LDLCALC, TRIG, CHOLHDL, LDLDIRECT in the last 72 hours. Thyroid Function Tests: No results for input(s): TSH, T4TOTAL, FREET4, T3FREE, THYROIDAB in the last 72 hours. Anemia Panel: No results for input(s): VITAMINB12, FOLATE, FERRITIN, TIBC, IRON, RETICCTPCT in the last 72  hours. Urine analysis:    Component Value Date/Time   COLORURINE AMBER (A) 10/19/2019 1937   APPEARANCEUR HAZY (A) 10/19/2019 1937   LABSPEC 1.026 10/19/2019 1937   PHURINE 5.0 10/19/2019 1937   GLUCOSEU NEGATIVE 10/19/2019 1937   HGBUR NEGATIVE 10/19/2019 1937   BILIRUBINUR NEGATIVE 10/19/2019 1937   KETONESUR NEGATIVE 10/19/2019 1937   PROTEINUR NEGATIVE 10/19/2019 1937   NITRITE NEGATIVE 10/19/2019 1937   LEUKOCYTESUR NEGATIVE 10/19/2019 1937   Sepsis Labs: @LABRCNTIP (procalcitonin:4,lacticidven:4)  ) Recent Results (from the past 240 hour(s))  Urine culture     Status: None   Collection Time: 10/19/19  7:37 PM   Specimen: Urine, Random  Result Value Ref Range Status   Specimen Description URINE, RANDOM  Final   Special Requests NONE  Final   Culture   Final    NO GROWTH Performed at [facility], [address] [place], [address] [place]    Report Status 10/21/2019 FINAL  Final  Gastrointestinal Panel by PCR , Stool     Status: None   Collection Time: 10/19/19  7:37 PM   Specimen: Urine, Catheterized; Stool  Result Value Ref Range Status   Campylobacter species NOT DETECTED NOT DETECTED Final   Plesimonas shigelloides NOT DETECTED NOT DETECTED Final   Salmonella species NOT DETECTED NOT DETECTED Final   Yersinia enterocolitica NOT DETECTED NOT DETECTED Final   Vibrio species NOT DETECTED NOT DETECTED Final   Vibrio cholerae NOT DETECTED NOT DETECTED Final   Enteroaggregative E coli (EAEC) NOT DETECTED NOT DETECTED Final   Enteropathogenic E coli (EPEC) NOT DETECTED NOT DETECTED Final   Enterotoxigenic E coli (ETEC) NOT DETECTED NOT DETECTED Final   Shiga like toxin producing E coli (STEC) NOT DETECTED NOT DETECTED Final   Shigella/Enteroinvasive E coli (EIEC) NOT DETECTED NOT DETECTED Final   Cryptosporidium NOT DETECTED NOT DETECTED Final   Cyclospora cayetanensis NOT DETECTED NOT DETECTED Final   Entamoeba histolytica NOT DETECTED NOT DETECTED Final    Giardia lamblia NOT DETECTED NOT DETECTED Final   Adenovirus F40/41 NOT DETECTED NOT DETECTED Final   Astrovirus NOT DETECTED NOT DETECTED Final   Norovirus GI/GII NOT DETECTED NOT DETECTED Final   Rotavirus A NOT DETECTED NOT DETECTED Final   Sapovirus (I, II, IV, and V) NOT DETECTED NOT DETECTED Final    Comment: Performed at [facility], [address] [place], [place] [address]  C Difficile Quick Screen w PCR reflex     Status: None   Collection Time: 10/19/19  7:37 PM   Specimen: Urine, Catheterized; Stool  Result Value Ref Range Status   C Diff antigen NEGATIVE NEGATIVE Final   C Diff toxin NEGATIVE NEGATIVE Final   C Diff interpretation No C. difficile detected.  Final    Comment: Performed at [facility], [address], [place], [place] [address]  Culture, blood (routine x 2)     Status: None   Collection Time: 10/19/19  8:51 PM   Specimen: BLOOD  Result Value Ref Range Status   Specimen Description BLOOD LEFT ANTECUBITAL  Final   Special Requests   Final    BOTTLES DRAWN AEROBIC AND ANAEROBIC Blood Culture adequate volume   Culture   Final    NO GROWTH 5 DAYS Performed at [facility], [address] [place], [place] [address]    Report Status 10/24/2019 FINAL  Final  Culture, blood (routine x 2)     Status: None   Collection Time: 10/19/19  9:12 PM   Specimen: BLOOD RIGHT HAND  Result Value Ref Range Status   Specimen Description BLOOD RIGHT HAND  Final   Special Requests   Final    BOTTLES DRAWN AEROBIC AND ANAEROBIC Blood Culture results may not be optimal due to an inadequate volume of blood received in culture bottles   Culture   Final    NO GROWTH 5 DAYS Performed at [facility], [address] [place], [place] [address]    Report Status 10/24/2019 FINAL  Final  SARS Coronavirus 2 by RT PCR (hospital order, performed in [facility] hospital lab) Nasopharyngeal Nasopharyngeal Swab     Status: None   Collection Time: 10/20/19   1:55 AM   Specimen: Nasopharyngeal Swab  Result Value Ref Range Status   SARS Coronavirus 2 NEGATIVE NEGATIVE Final    Comment: (NOTE) SARS-CoV-2 target nucleic acids are NOT DETECTED.  The SARS-CoV-2 RNA is generally detectable in upper and lower respiratory  specimens during the acute phase of infection. The lowest concentration of SARS-CoV-2 viral copies this assay can detect is 250 copies / mL. A negative result does not preclude SARS-CoV-2 infection and should not be used as the sole basis for treatment or other patient management decisions.  A negative result may occur with improper specimen collection / handling, submission of specimen other than nasopharyngeal swab, presence of viral mutation(s) within the areas targeted by this assay, and inadequate number of viral copies (<250 copies / mL). A negative result must be combined with clinical observations, patient history, and epidemiological information.  Fact Sheet for Patients:   BoilerBrush.com.cy  Fact Sheet for Healthcare Providers: https://pope.com/  This test is not yet approved or  cleared by the [place] FDA and has been authorized for detection and/or diagnosis of SARS-CoV-2 by FDA under an Emergency Use Authorization (EUA).  This EUA will remain in effect (meaning this test can be used) for the duration of the COVID-19 declaration under Section 564(b)(1) of the Act, 21 U.S.C. section 360bbb-3(b)(1), unless the authorization is terminated or revoked sooner.  Performed at [facility], [address], [place], [place] [address]          Radiology Studies: No results found.      Scheduled Meds: . atenolol  25 mg Oral BID  . cefdinir  300 mg Oral Q12H  . cholecalciferol  2,000 Units Oral Daily  . enoxaparin (LOVENOX) injection  40 mg Subcutaneous Q24H  . Gerhardt's butt cream   Topical 6 X Daily  . lactobacillus acidophilus  2 tablet Oral  Daily  . levETIRAcetam  1,500 mg Oral BID  . lisinopril  5 mg Oral Daily  . metroNIDAZOLE  250 mg Oral Q8H  . nystatin cream   Topical BID  . pantoprazole  40 mg Oral QHS   Continuous Infusions:    LOS: 4 days    Time spent:  Zannie Cove, MD [facility] 10/24/2019, 11:01 AM

## 2019-10-25 LAB — CBC
HCT: 38.6 % — ABNORMAL LOW (ref 39.0–52.0)
Hemoglobin: 12.2 g/dL — ABNORMAL LOW (ref 13.0–17.0)
MCH: 29.8 pg (ref 26.0–34.0)
MCHC: 31.6 g/dL (ref 30.0–36.0)
MCV: 94.4 fL (ref 80.0–100.0)
Platelets: 275 10*3/uL (ref 150–400)
RBC: 4.09 MIL/uL — ABNORMAL LOW (ref 4.22–5.81)
RDW: 14.6 % (ref 11.5–15.5)
WBC: 6.6 10*3/uL (ref 4.0–10.5)
nRBC: 0 % (ref 0.0–0.2)

## 2019-10-25 LAB — BASIC METABOLIC PANEL
Anion gap: 9 (ref 5–15)
BUN: 11 mg/dL (ref 6–20)
CO2: 24 mmol/L (ref 22–32)
Calcium: 8.7 mg/dL — ABNORMAL LOW (ref 8.9–10.3)
Chloride: 109 mmol/L (ref 98–111)
Creatinine, Ser: 0.62 mg/dL (ref 0.61–1.24)
GFR calc Af Amer: >60 mL/min (ref >60)
GFR calc non Af Amer: >60 mL/min (ref >60)
Glucose, Bld: 103 mg/dL — ABNORMAL HIGH (ref 70–99)
Potassium: 4 mmol/L (ref 3.5–5.1)
Sodium: 142 mmol/L (ref 135–145)

## 2019-10-25 NOTE — Progress Notes (Signed)
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 -approximately 5 weeks or so ago he was started on an antibiotic therapy for suspected catheter associated urinary tract infection.  Around this time, patient states that he began to develop diarrhea.  Patient explains that this diarrhea is watery with occasional small amounts of blood.  Diarrhea occurs up to 15 times daily. -In the weeks that followed, patient was placed on courses of both Bactrim and nitrofurantoin for suspected urinary tract infection.   -Presented to [facility] room, work-up in the ED noted concern for ascending and transverse colitis, also noted to have low-grade temp of 100.5 and a white count 13.9, in addition noted to have full-thickness wounds on his bottom with tissue loss  Assessment & Plan:   Colitis Subacute diarrhea -CT did note colitis of ascending and transverse colon -C. difficile PCR screen negative x1, repeat ordered was canceled by the lab per Dr. Jomarie Longs -GI pathogen panel was negative  -Treated with IV ceftriaxone and Flagyl, day 5 today, changed to oral cephalosporin and Flagyl for 2-3 more days -given a dose of imodium yesterday -asked RN to monitor stool output/frequency  Multiple full-thickness buttock wounds with tissue loss -Present on admission -Worsened in the setting of ongoing diarrhea, MASD -Treated with Flexi-Seal for the last 5 days, since diarrhea is improving will discontinue this and monitor -Attempt to keep this clean and dry -Continue local  care, appreciate wound care consult  Essential hypertension -Continue home regimen of atenolol  Seizure disorder Spina bifida -Continue Keppra  Neurogenic bladder -Continue self-catheterization per home regimen  Esophagitis -Started PPI   will ask care management to evaluate patient and see what kind of assistance patient can get with wound care  DVT prophylaxis: Lovenox Code Status: Full code Family Communication: No family at bedside Disposition Plan:  Status is: Inpatient  Remains inpatient appropriate because:Inpatient level of care appropriate due to severity of illness, Home pending improvement in diarrhea, patient is physically limited by his spina bifida and disabilities with sacral wounds and ongoing diarrhea   Dispo: The patient is from: Home              Anticipated d/c is to: Home              Anticipated d/c date is: 1-2              Patient currently is not medically stable to d/c.may need GI consult as slowly improving with stool but still with pain   Subjective: C/o of burning in his groin  Objective: Vitals:   10/24/19 0829 10/24/19 2054 10/25/19 0523 10/25/19 0925  BP: 131/75 139/65 (!) 143/76 (!) 142/68  Pulse: 72 75 70 81  Resp: 18 18 17 18   Temp: 98.1 F (36.7 C) 98.5 F (36.9 C) 98.5 F (36.9 C) 98.4 F (36.9 C)  TempSrc: Oral   Oral  SpO2: 100% 98% 100% 100%  Weight:  68.7 kg    Height:        Intake/Output Summary (Last 24 hours) at 10/25/2019 1451 Last data filed at 10/25/2019 0800 Gross per 24 hour  Intake 600  ml  Output 600 ml  Net 0 ml   Filed Weights   10/20/19 0542 10/24/19 2054  Weight: 69.4 kg 68.7 kg    Examination:   General: Appearance:     Overweight male in no acute distress     Lungs:     Clear to auscultation bilaterally, respirations unlabored  Heart:    Normal heart rate. Normal rhythm. No murmurs, rubs, or gallops.      Neurologic:   Awake, alert, oriented x 3. No apparent focal neurological            defect.      Data Reviewed:   CBC: Recent Labs  Lab 10/19/19 1721 10/19/19 1721 10/21/19 0326 10/22/19 0416 10/23/19 0507 10/24/19 0509 10/25/19 0446  WBC 13.9*   < > 5.6 5.1 6.0 7.5 6.6  NEUTROABS 11.8*  --  3.8  --   --   --   --   HGB 11.5*   < > 10.8* 10.6* 13.0 11.9* 12.2*  HCT 36.5*   < > 34.0* 33.3* 39.3 37.7* 38.6*  MCV 93.6   < > 93.4 93.5 93.6 95.2 94.4  PLT 369   < > 263 291 383 301 275   < > = values in this interval not displayed.   Basic Metabolic Panel: Recent Labs  Lab 10/19/19 1721 10/19/19 1721 10/21/19 0326 10/22/19 0416 10/23/19 0507 10/24/19 0509 10/25/19 0446  NA 136   < > 141 142 142 143 142  K 4.4   < > 4.4 4.6 4.1 4.7 4.0  CL 102   < > 107 111 111 112* 109  CO2 25   < > 21* 22 21* 22 24  GLUCOSE 111*   < > 95 125* 114* 108* 103*  BUN 17   < > 6 9 9 11 11   CREATININE 0.94   < > 0.71 0.75 0.85 0.79 0.62  CALCIUM 8.9   < > 8.6* 8.3* 8.7* 8.7* 8.7*  MG 2.3  --  2.1  --   --   --   --    < > = values in this interval not displayed.   GFR: Estimated Creatinine Clearance: 87.8 mL/min (by C-G formula based on SCr of 0.62 mg/dL). Liver Function Tests: Recent Labs  Lab 10/19/19 1721 10/21/19 0326  AST 28 15  ALT 53* 29  ALKPHOS 106 66  BILITOT 0.5 0.6  PROT 7.4 6.2*  ALBUMIN 3.0* 2.4*   No results for input(s): LIPASE, AMYLASE in the last 168 hours. No results for input(s): AMMONIA in the last 168 hours. Coagulation Profile: No results for input(s): INR, PROTIME in the last 168 hours. Cardiac Enzymes: No results for input(s): CKTOTAL, CKMB, CKMBINDEX, TROPONINI in the last 168 hours. BNP (last 3 results) No results for input(s): PROBNP in the last 8760 hours. HbA1C: No results for input(s): HGBA1C in the last 72 hours. CBG: Recent Labs  Lab 10/21/19 1150  GLUCAP 77   Lipid Profile: No results for input(s): CHOL, HDL, LDLCALC, TRIG, CHOLHDL, LDLDIRECT in the last 72 hours. Thyroid Function Tests: No results for input(s): TSH,  T4TOTAL, FREET4, T3FREE, THYROIDAB in the last 72 hours. Anemia Panel: No results for input(s): VITAMINB12, FOLATE, FERRITIN, TIBC, IRON, RETICCTPCT in the last 72 hours. Urine analysis:    Component Value Date/Time   COLORURINE AMBER (A) 10/19/2019 1937   APPEARANCEUR HAZY (A) 10/19/2019 1937   LABSPEC 1.026 10/19/2019 1937   PHURINE 5.0 10/19/2019 1937   GLUCOSEU NEGATIVE 10/19/2019  1937   HGBUR NEGATIVE 10/19/2019 1937   BILIRUBINUR NEGATIVE 10/19/2019 1937   KETONESUR NEGATIVE 10/19/2019 1937   PROTEINUR NEGATIVE 10/19/2019 1937   NITRITE NEGATIVE 10/19/2019 1937   LEUKOCYTESUR NEGATIVE 10/19/2019 1937    ) Recent Results (from the past 240 hour(s))  Urine culture     Status: None   Collection Time: 10/19/19  7:37 PM   Specimen: Urine, Random  Result Value Ref Range Status   Specimen Description URINE, RANDOM  Final   Special Requests NONE  Final   Culture   Final    NO GROWTH Performed at [facility], [address] [place], [place] [address]    Report Status 10/21/2019 FINAL  Final  Gastrointestinal Panel by PCR , Stool     Status: None   Collection Time: 10/19/19  7:37 PM   Specimen: Urine, Catheterized; Stool  Result Value Ref Range Status   Campylobacter species NOT DETECTED NOT DETECTED Final   Plesimonas shigelloides NOT DETECTED NOT DETECTED Final   Salmonella species NOT DETECTED NOT DETECTED Final   Yersinia enterocolitica NOT DETECTED NOT DETECTED Final   Vibrio species NOT DETECTED NOT DETECTED Final   Vibrio cholerae NOT DETECTED NOT DETECTED Final   Enteroaggregative E coli (EAEC) NOT DETECTED NOT DETECTED Final   Enteropathogenic E coli (EPEC) NOT DETECTED NOT DETECTED Final   Enterotoxigenic E coli (ETEC) NOT DETECTED NOT DETECTED Final   Shiga like toxin producing E coli (STEC) NOT DETECTED NOT DETECTED Final   Shigella/Enteroinvasive E coli (EIEC) NOT DETECTED NOT DETECTED Final   Cryptosporidium NOT DETECTED NOT DETECTED Final    Cyclospora cayetanensis NOT DETECTED NOT DETECTED Final   Entamoeba histolytica NOT DETECTED NOT DETECTED Final   Giardia lamblia NOT DETECTED NOT DETECTED Final   Adenovirus F40/41 NOT DETECTED NOT DETECTED Final   Astrovirus NOT DETECTED NOT DETECTED Final   Norovirus GI/GII NOT DETECTED NOT DETECTED Final   Rotavirus A NOT DETECTED NOT DETECTED Final   Sapovirus (I, II, IV, and V) NOT DETECTED NOT DETECTED Final    Comment: Performed at [facility], [address] [place], [place] [address]  C Difficile Quick Screen w PCR reflex     Status: None   Collection Time: 10/19/19  7:37 PM   Specimen: Urine, Catheterized; Stool  Result Value Ref Range Status   C Diff antigen NEGATIVE NEGATIVE Final   C Diff toxin NEGATIVE NEGATIVE Final   C Diff interpretation No C. difficile detected.  Final    Comment: Performed at [facility], [address] [place], [place] [address]  Culture, blood (routine x 2)     Status: None   Collection Time: 10/19/19  8:51 PM   Specimen: BLOOD  Result Value Ref Range Status   Specimen Description BLOOD LEFT ANTECUBITAL  Final   Special Requests   Final    BOTTLES DRAWN AEROBIC AND ANAEROBIC Blood Culture adequate volume   Culture   Final    NO GROWTH 5 DAYS Performed at [facility], [address] [place], [place] [address]    Report Status 10/24/2019 FINAL  Final  Culture, blood (routine x 2)     Status: None   Collection Time: 10/19/19  9:12 PM   Specimen: BLOOD RIGHT HAND  Result Value Ref Range Status   Specimen Description BLOOD RIGHT HAND  Final   Special Requests   Final    BOTTLES DRAWN AEROBIC AND ANAEROBIC Blood Culture results may  not be optimal due to an inadequate volume of blood received in culture bottles   Culture   Final    NO GROWTH 5 DAYS Performed at [facility], [address], [place], [place] [address]    Report Status 10/24/2019 FINAL  Final  SARS Coronavirus 2 by RT PCR (hospital order,  performed in [facility] hospital lab) Nasopharyngeal Nasopharyngeal Swab     Status: None   Collection Time: 10/20/19  1:55 AM   Specimen: Nasopharyngeal Swab  Result Value Ref Range Status   SARS Coronavirus 2 NEGATIVE NEGATIVE Final    Comment: (NOTE) SARS-CoV-2 target nucleic acids are NOT DETECTED.  The SARS-CoV-2 RNA is generally detectable in upper and lower respiratory specimens during the acute phase of infection. The lowest concentration of SARS-CoV-2 viral copies this assay can detect is 250 copies / mL. A negative result does not preclude SARS-CoV-2 infection and should not be used as the sole basis for treatment or other patient management decisions.  A negative result may occur with improper specimen collection / handling, submission of specimen other than nasopharyngeal swab, presence of viral mutation(s) within the areas targeted by this assay, and inadequate number of viral copies (<250 copies / mL). A negative result must be combined with clinical observations, patient history, and epidemiological information.  Fact Sheet for Patients:   BoilerBrush.com.cy  Fact Sheet for Healthcare Providers: https://pope.com/  This test is not yet approved or  cleared by the [place] FDA and has been authorized for detection and/or diagnosis of SARS-CoV-2 by FDA under an Emergency Use Authorization (EUA).  This EUA will remain in effect (meaning this test can be used) for the duration of the COVID-19 declaration under Section 564(b)(1) of the Act, 21 U.S.C. section 360bbb-3(b)(1), unless the authorization is terminated or revoked sooner.  Performed at [facility], [address] [place], [place] [address]          Radiology Studies: No results found.      Scheduled Meds: . atenolol  25 mg Oral BID  . cefdinir  300 mg Oral Q12H  . cholecalciferol  2,000 Units Oral Daily  . enoxaparin (LOVENOX)  injection  40 mg Subcutaneous Q24H  . Gerhardt's butt cream   Topical 6 X Daily  . lactobacillus acidophilus  2 tablet Oral Daily  . levETIRAcetam  1,500 mg Oral BID  . lisinopril  5 mg Oral Daily  . metroNIDAZOLE  250 mg Oral Q8H  . nystatin cream   Topical BID  . pantoprazole  40 mg Oral QHS   Continuous Infusions:    LOS: 5 days    Time spent:  Marlin Canary [facility] 10/25/2019, 2:51 PM

## 2019-10-26 DIAGNOSIS — R935 Abnormal findings on diagnostic imaging of other abdominal regions, including retroperitoneum: Secondary | ICD-10-CM

## 2019-10-26 DIAGNOSIS — R197 Diarrhea, unspecified: Secondary | ICD-10-CM

## 2019-10-26 MED ORDER — ENOXAPARIN SODIUM 40 MG/0.4ML ~~LOC~~ SOLN
40.0000 mg | SUBCUTANEOUS | Status: DC
  Administered 2019-10-28 – 2019-10-30 (×3): 40 mg via SUBCUTANEOUS
  Filled 2019-10-26 (×3): qty 0.4

## 2019-10-26 NOTE — Evaluation (Signed)
Physical Therapy Evaluation Patient Details Name: Carlos Ferguson MRN: [id number] DOB: 1965-06-12 Today's Date: 10/26/2019   History of Present Illness  Pt is a 54 y/o male admitted secondary to diarrhea and has bilateral buttock wounds. Imaging revelaed colitis of ascending and transverse colon. PMH includes spina bifida, CVA, chiari malformation s/p shunt, CVA, seizure, and HTN.   Clinical Impression  Pt admitted secondary to problem above with deficits below. Pt with spina bifida at baseline. Pt requiring mod A to stand with use of crutches. Pt with increased unsteadiness, so further mobility unsafe to attempt with +1 assist. Pt was previously very independent with use of crutches. Feel pt would be excellent candidate for CIR as he is very motivated to regain independence. Will continue to follow acutely to maximize functional mobility independence and safety.     Follow Up Recommendations CIR    Equipment Recommendations  Other (comment) (TBD)    Recommendations for Other Services       Precautions / Restrictions Precautions Precautions: Fall Required Braces or Orthoses: Other Brace Other Brace: Has bilateral LE braces.  Restrictions Weight Bearing Restrictions: No      Mobility  Bed Mobility Overal bed mobility: Needs Assistance Bed Mobility: Supine to Sit;Sit to Supine     Supine to sit: Min assist Sit to supine: Min assist   General bed mobility comments: Min A for LE assist.   Transfers Overall transfer level: Needs assistance Equipment used: Crutches Transfers: Sit to/from Stand Sit to Stand: Mod assist;From elevated surface         General transfer comment: Mod A from elevated surface. Notable unsteadiness, so further mobility unsafe to attempt with +1 assist.   Ambulation/Gait                Stairs            Wheelchair Mobility    Modified Rankin (Stroke Patients Only)       Balance Overall balance assessment: Needs  assistance Sitting-balance support: Feet supported Sitting balance-Leahy Scale: Good     Standing balance support: Bilateral upper extremity supported;During functional activity Standing balance-Leahy Scale: Poor Standing balance comment: Reliant on bUE support                              Pertinent Vitals/Pain Pain Assessment: 0-10 Pain Score: 6  Pain Location: bottom  Pain Descriptors / Indicators: Grimacing;Guarding Pain Intervention(s): Limited activity within patient's tolerance;Monitored during session;Repositioned    Home Living Family/patient expects to be discharged to:: Private residence Living Arrangements: Alone Available Help at Discharge: Family (dad is next door ) Type of Home: House Home Access: Stairs to enter Entrance Stairs-Rails: Right Entrance Stairs-Number of Steps: 4 Home Layout: One level Home Equipment: Crutches;Wheelchair - manual      Prior Function Level of Independence: Independent with assistive device(s)         Comments: Uses crutches and braces for short distance ambulation. Uses WC for longer distances when he goes to car shows. Driving.      Hand Dominance        Extremity/Trunk Assessment   Upper Extremity Assessment Upper Extremity Assessment: Defer to OT evaluation    Lower Extremity Assessment Lower Extremity Assessment: RLE deficits/detail;LLE deficits/detail RLE Deficits / Details: spina bifida at baseline. Uses AFO LLE Deficits / Details: Spina bifida at baseline. Very limited quad activation.     Cervical / Trunk Assessment Cervical / Trunk Assessment: Kyphotic  Communication  Communication: No difficulties  Cognition Arousal/Alertness: Awake/alert Behavior During Therapy: WFL for tasks assessed/performed Overall Cognitive Status: Within Functional Limits for tasks assessed                                        General Comments General comments (skin integrity, edema, etc.):  Educated about pressure relief positioning.     Exercises     Assessment/Plan    PT Assessment Patient needs continued PT services  PT Problem List Decreased strength;Decreased balance;Decreased mobility       PT Treatment Interventions Gait training;Stair training;Functional mobility training;Therapeutic activities;Therapeutic exercise;Neuromuscular re-education;Balance training;Patient/family education    PT Goals (Current goals can be found in the Care Plan section)  Acute Rehab PT Goals Patient Stated Goal: to get stronger before going home  PT Goal Formulation: With patient Time For Goal Achievement: 11/09/19 Potential to Achieve Goals: Good    Frequency Min 3X/week   Barriers to discharge        Co-evaluation               AM-PAC PT "6 Clicks" Mobility  Outcome Measure Help needed turning from your back to your side while in a flat bed without using bedrails?: A Little Help needed moving from lying on your back to sitting on the side of a flat bed without using bedrails?: A Little Help needed moving to and from a bed to a chair (including a wheelchair)?: A Lot Help needed standing up from a chair using your arms (e.g., wheelchair or bedside chair)?: A Lot Help needed to walk in hospital room?: Total Help needed climbing 3-5 steps with a railing? : Total 6 Click Score: 12    End of Session Equipment Utilized During Treatment: Gait belt Activity Tolerance: Patient tolerated treatment well Patient left: in bed;with call bell/phone within reach Nurse Communication: Mobility status PT Visit Diagnosis: Unsteadiness on feet (R26.81);Muscle weakness (generalized) (M62.81);Difficulty in walking, not elsewhere classified (R26.2)    Time: 0623-7628 PT Time Calculation (min) (ACUTE ONLY): 23 min   Charges:   PT Evaluation $PT Eval Moderate Complexity: 1 Mod PT Treatments $Therapeutic Activity: 8-22 mins        Cindee Salt, DPT  Acute Rehabilitation  Services  Pager: [phone] Office: [phone]   [name] 10/26/2019, 6:29 PM

## 2019-10-26 NOTE — Progress Notes (Signed)
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 -approximately 5 weeks or so ago he was started on an antibiotic therapy for suspected catheter associated urinary tract infection.  Around this time, patient states that he began to develop diarrhea.  Patient explains that this diarrhea is watery with occasional small amounts of blood.  Diarrhea occurs up to 15 times daily. -In the weeks that followed, patient was placed on courses of both Bactrim and nitrofurantoin for suspected urinary tract infection.   -Presented to [facility] emergency room, work-up in the ED noted concern for ascending and transverse colitis, also noted to have low-grade temp of 100.5 and a white count 13.9, in addition noted to have full-thickness wounds on his bottom with tissue loss  Assessment & Plan:   Colitis Subacute diarrhea -CT did note colitis of ascending and transverse colon -C. difficile PCR screen negative x1, repeat ordered was canceled by the lab per Dr. Jomarie Longs -GI pathogen panel was negative  -Treated with IV ceftriaxone and Flagyl, day 5 today, changed to oral cephalosporin and Flagyl-- d/c abx -given a dose of imodium  -asked RN to monitor stool output/frequency -GI consult as very slow to improve-- this AM Was covered in greenish stool-- including wounds  Multiple full-thickness buttock wounds with tissue loss -Present on admission -Worsened in the setting of ongoing diarrhea, MASD -Attempt to keep this clean and dry -Continue local care, appreciate wound care  consult -was covered with stool this AM on left buttock -if continues to have pain. May need further imaging  Essential hypertension -Continue home regimen of atenolol  Seizure disorder Spina bifida -Continue Keppra  Neurogenic bladder -Continue self-catheterization per home regimen  Esophagitis -Started PPI   will ask care management to evaluate patient and see what kind of assistance patient can get with wound care vs needing to go to SNF  DVT prophylaxis: Lovenox Code Status: Full code Family Communication: No family at bedside Disposition Plan:  Status is: Inpatient  Remains inpatient appropriate because:Inpatient level of care appropriate due to severity of illness, Home pending improvement in diarrhea, patient is physically limited by his spina bifida and disabilities with sacral wounds and ongoing diarrhea   Dispo: The patient is from: Home              Anticipated d/c is to: Home              Anticipated d/c date is: 1-2              Patient currently is not medically stable to d/c.   Subjective: C/o of burning in his groin  Objective: Vitals:   10/25/19 1645 10/25/19 2117 10/26/19 0505 10/26/19 1004  BP: 123/65 (!) 126/52 134/64 123/64  Pulse: 63 60 66 68  Resp: 18 18 17 18   Temp: 98.5 F (36.9 C) 98 F (36.7 C) 98.2 F (36.8 C) 97.8 F (36.6 C)  TempSrc: Oral   Oral  SpO2: 98% 98% 98% 100%  Weight:      Height:        Intake/Output Summary (Last 24 hours) at 10/26/2019 1327 Last data filed at 10/26/2019 1114  Gross per 24 hour  Intake 500 ml  Output 922 ml  Net -422 ml   Filed Weights   10/20/19 0542 10/24/19 2054  Weight: 69.4 kg 68.7 kg    Examination:  General: Appearance:     Chronically ill male in no acute distress     Lungs:     Clear to auscultation bilaterally, respirations unlabored  Heart:    Normal heart rate. Normal rhythm. No murmurs, rubs, or gallops.   Skin: Multiple wounds on bottom, largest 3-4in round        Data  Reviewed:   CBC: Recent Labs  Lab 10/19/19 1721 10/19/19 1721 10/21/19 0326 10/22/19 0416 10/23/19 0507 10/24/19 0509 10/25/19 0446  WBC 13.9*   < > 5.6 5.1 6.0 7.5 6.6  NEUTROABS 11.8*  --  3.8  --   --   --   --   HGB 11.5*   < > 10.8* 10.6* 13.0 11.9* 12.2*  HCT 36.5*   < > 34.0* 33.3* 39.3 37.7* 38.6*  MCV 93.6   < > 93.4 93.5 93.6 95.2 94.4  PLT 369   < > 263 291 383 301 275   < > = values in this interval not displayed.   Basic Metabolic Panel: Recent Labs  Lab 10/19/19 1721 10/19/19 1721 10/21/19 0326 10/22/19 0416 10/23/19 0507 10/24/19 0509 10/25/19 0446  NA 136   < > 141 142 142 143 142  K 4.4   < > 4.4 4.6 4.1 4.7 4.0  CL 102   < > 107 111 111 112* 109  CO2 25   < > 21* 22 21* 22 24  GLUCOSE 111*   < > 95 125* 114* 108* 103*  BUN 17   < > 6 9 9 11 11   CREATININE 0.94   < > 0.71 0.75 0.85 0.79 0.62  CALCIUM 8.9   < > 8.6* 8.3* 8.7* 8.7* 8.7*  MG 2.3  --  2.1  --   --   --   --    < > = values in this interval not displayed.   GFR: Estimated Creatinine Clearance: 87.8 mL/min (by C-G formula based on SCr of 0.62 mg/dL). Liver Function Tests: Recent Labs  Lab 10/19/19 1721 10/21/19 0326  AST 28 15  ALT 53* 29  ALKPHOS 106 66  BILITOT 0.5 0.6  PROT 7.4 6.2*  ALBUMIN 3.0* 2.4*   No results for input(s): LIPASE, AMYLASE in the last 168 hours. No results for input(s): AMMONIA in the last 168 hours. Coagulation Profile: No results for input(s): INR, PROTIME in the last 168 hours. Cardiac Enzymes: No results for input(s): CKTOTAL, CKMB, CKMBINDEX, TROPONINI in the last 168 hours. BNP (last 3 results) No results for input(s): PROBNP in the last 8760 hours. HbA1C: No results for input(s): HGBA1C in the last 72 hours. CBG: Recent Labs  Lab 10/21/19 1150  GLUCAP 77   Lipid Profile: No results for input(s): CHOL, HDL, LDLCALC, TRIG, CHOLHDL, LDLDIRECT in the last 72 hours. Thyroid Function Tests: No results for input(s): TSH, T4TOTAL, FREET4,  T3FREE, THYROIDAB in the last 72 hours. Anemia Panel: No results for input(s): VITAMINB12, FOLATE, FERRITIN, TIBC, IRON, RETICCTPCT in the last 72 hours. Urine analysis:    Component Value Date/Time   COLORURINE AMBER (A) 10/19/2019 1937   APPEARANCEUR HAZY (A) 10/19/2019 1937   LABSPEC 1.026 10/19/2019 1937   PHURINE 5.0 10/19/2019 1937   GLUCOSEU NEGATIVE 10/19/2019 1937   HGBUR NEGATIVE 10/19/2019 1937  BILIRUBINUR NEGATIVE 10/19/2019 1937   KETONESUR NEGATIVE 10/19/2019 1937   PROTEINUR NEGATIVE 10/19/2019 1937   NITRITE NEGATIVE 10/19/2019 1937   LEUKOCYTESUR NEGATIVE 10/19/2019 1937    ) Recent Results (from the past 240 hour(s))  Urine culture     Status: None   Collection Time: 10/19/19  7:37 PM   Specimen: Urine, Random  Result Value Ref Range Status   Specimen Description URINE, RANDOM  Final   Special Requests NONE  Final   Culture   Final    NO GROWTH Performed at [facility], [address] [place], [place] [address]    Report Status 10/21/2019 FINAL  Final  Gastrointestinal Panel by PCR , Stool     Status: None   Collection Time: 10/19/19  7:37 PM   Specimen: Urine, Catheterized; Stool  Result Value Ref Range Status   Campylobacter species NOT DETECTED NOT DETECTED Final   Plesimonas shigelloides NOT DETECTED NOT DETECTED Final   Salmonella species NOT DETECTED NOT DETECTED Final   Yersinia enterocolitica NOT DETECTED NOT DETECTED Final   Vibrio species NOT DETECTED NOT DETECTED Final   Vibrio cholerae NOT DETECTED NOT DETECTED Final   Enteroaggregative E coli (EAEC) NOT DETECTED NOT DETECTED Final   Enteropathogenic E coli (EPEC) NOT DETECTED NOT DETECTED Final   Enterotoxigenic E coli (ETEC) NOT DETECTED NOT DETECTED Final   Shiga like toxin producing E coli (STEC) NOT DETECTED NOT DETECTED Final   Shigella/Enteroinvasive E coli (EIEC) NOT DETECTED NOT DETECTED Final   Cryptosporidium NOT DETECTED NOT DETECTED Final   Cyclospora cayetanensis  NOT DETECTED NOT DETECTED Final   Entamoeba histolytica NOT DETECTED NOT DETECTED Final   Giardia lamblia NOT DETECTED NOT DETECTED Final   Adenovirus F40/41 NOT DETECTED NOT DETECTED Final   Astrovirus NOT DETECTED NOT DETECTED Final   Norovirus GI/GII NOT DETECTED NOT DETECTED Final   Rotavirus A NOT DETECTED NOT DETECTED Final   Sapovirus (I, II, IV, and V) NOT DETECTED NOT DETECTED Final    Comment: Performed at [facility], [address] [place], [place] [address]  C Difficile Quick Screen w PCR reflex     Status: None   Collection Time: 10/19/19  7:37 PM   Specimen: Urine, Catheterized; Stool  Result Value Ref Range Status   C Diff antigen NEGATIVE NEGATIVE Final   C Diff toxin NEGATIVE NEGATIVE Final   C Diff interpretation No C. difficile detected.  Final    Comment: Performed at [facility], [address] [place], [place] [address]  Culture, blood (routine x 2)     Status: None   Collection Time: 10/19/19  8:51 PM   Specimen: BLOOD  Result Value Ref Range Status   Specimen Description BLOOD LEFT ANTECUBITAL  Final   Special Requests   Final    BOTTLES DRAWN AEROBIC AND ANAEROBIC Blood Culture adequate volume   Culture   Final    NO GROWTH 5 DAYS Performed at [facility], [address] [place], [place] [address]    Report Status 10/24/2019 FINAL  Final  Culture, blood (routine x 2)     Status: None   Collection Time: 10/19/19  9:12 PM   Specimen: BLOOD RIGHT HAND  Result Value Ref Range Status   Specimen Description BLOOD RIGHT HAND  Final   Special Requests   Final    BOTTLES DRAWN AEROBIC AND ANAEROBIC Blood Culture results may not be optimal due to an inadequate volume of  blood received in culture bottles   Culture   Final    NO GROWTH 5 DAYS Performed at [facility], [address], [place], [place] [address]    Report Status 10/24/2019 FINAL  Final  SARS Coronavirus 2 by RT PCR (hospital order, performed in [facility]  hospital lab) Nasopharyngeal Nasopharyngeal Swab     Status: None   Collection Time: 10/20/19  1:55 AM   Specimen: Nasopharyngeal Swab  Result Value Ref Range Status   SARS Coronavirus 2 NEGATIVE NEGATIVE Final    Comment: (NOTE) SARS-CoV-2 target nucleic acids are NOT DETECTED.  The SARS-CoV-2 RNA is generally detectable in upper and lower respiratory specimens during the acute phase of infection. The lowest concentration of SARS-CoV-2 viral copies this assay can detect is 250 copies / mL. A negative result does not preclude SARS-CoV-2 infection and should not be used as the sole basis for treatment or other patient management decisions.  A negative result may occur with improper specimen collection / handling, submission of specimen other than nasopharyngeal swab, presence of viral mutation(s) within the areas targeted by this assay, and inadequate number of viral copies (<250 copies / mL). A negative result must be combined with clinical observations, patient history, and epidemiological information.  Fact Sheet for Patients:   BoilerBrush.com.cy  Fact Sheet for Healthcare Providers: https://pope.com/  This test is not yet approved or  cleared by the [place] FDA and has been authorized for detection and/or diagnosis of SARS-CoV-2 by FDA under an Emergency Use Authorization (EUA).  This EUA will remain in effect (meaning this test can be used) for the duration of the COVID-19 declaration under Section 564(b)(1) of the Act, 21 U.S.C. section 360bbb-3(b)(1), unless the authorization is terminated or revoked sooner.  Performed at [facility], [address] [place], [place] [address]          Radiology Studies: No results found.      Scheduled Meds: . atenolol  25 mg Oral BID  . cholecalciferol  2,000 Units Oral Daily  . enoxaparin (LOVENOX) injection  40 mg Subcutaneous Q24H  . Gerhardt's butt cream    Topical 6 X Daily  . lactobacillus acidophilus  2 tablet Oral Daily  . levETIRAcetam  1,500 mg Oral BID  . lisinopril  5 mg Oral Daily  . nystatin cream   Topical BID  . pantoprazole  40 mg Oral QHS   Continuous Infusions:    LOS: 6 days    Time spent:  Marlin Canary [facility] 10/26/2019, 1:27 PM

## 2019-10-26 NOTE — Consult Note (Signed)
[facility] Consult: 12:20 PM 10/26/2019  LOS: 6 days    Referring Provider: Dr Marlin Canary  Primary Care Physician:  System, Pcp Not In Resa Miner at [facility].  Primary Gastroenterologist:  unassigned     Reason for Consultation:  Diarrhea.     HPI: Carlos Ferguson is a 54 y.o. male.  Resident of [facility].  Past history spinal bifida.  Hemorrhagic stroke 01/2019.  Seizures.  Neurogenic bladder, self caths qid.  Chiari malformation, chronic ventriculomegaly, s/p shunt.  Htn.    About 5 weeks ago patient had course of antibiotic for suspected UTI, prescribed by his urologist.  Within 24 hours of starting  Abx, he developed watery diarrhea with occasional small amounts of blood, stooling up to 15 times a day.  Subsequently patient received courses of Bactrim and Macrodantin for UTIs as well.   Presented to and admitted to [facility] a week ago with temp 100.5, WBC 13.9, full-thickness gluteal wounds. 1 dose meropenem, 5 days Rocephin and IV Flagyl; day 3 Flagyl, day 3 cefdinir based on 10/19/2019 CTAP findings of submucosal fatty infiltration of ascending, transverse colon with mild adjacent pericolonic edema.  Liquid stool in descending and sigmoid.  Minor uncomplicated colon diverticulosis.  Mild, distal esophageal wall thickening.  Hepatic steatosis.  Uncomplicated cholelithiasis. He is also receiving lactobacillus supplements since arrival.  Blood and urine cultures are negative for growth Stool for C. difficile is negative for antigen and toxin. Gi path PCR panel all negative.  Watery diarrhea persists Currently no significant electrolyte abnormalities, normal renal function, no leukocytosis.  Minor anemia with Hgb 12.2.  Baseline bowel function is alternating diarrhea,  constipation.  Diarrhea has never been as bad as it is currently.  Has never had colonoscopy, was previously scheduled some years back but he got sick and never followed up to reschedule the colonoscopy.  Family history negative for any bowel or gastric issues, cancers.    Past Medical History:  Diagnosis Date  . Cellulitis of left knee 2014  . Flaccid neurogenic bladder    self caths every 4 hours  . Frequent UTI   . Hydrocephalus (HCC) 2014  . ICH (intracerebral hemorrhage) (HCC) 2014   left frontal ICH  . Seizures (HCC)   . Self-catheterizes urinary bladder   . Sepsis (HCC) 2014   due to knee infection   . Spina bifida (HCC)   . 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     Past Surgical History:  Procedure Laterality Date  . CSF SHUNT  1969  . LLE surgery     Multiple surgeries for correction.   Marland Kitchen RLE surgery     surgery for correction     Prior to Admission medications   Medication Sig Start Date End Date Taking? Authorizing Provider  acetaminophen (TYLENOL) 500 MG tablet Take 1,000 mg by mouth every 6 (six) hours as needed for mild pain.   Yes [provider]  atenolol (TENORMIN) 25 MG tablet Take 3 tablets (75 mg total) by mouth  daily. Patient taking differently: Take 25 mg by mouth 2 (two) times daily.  02/02/19  Yes Love, Evlyn KannerPamela S, PA-C  flavoxATE (URISPAS) 100 MG tablet Take 1 tablet (100 mg total) by mouth 3 (three) times daily as needed for bladder spasms. 02/01/19  Yes Love, Evlyn KannerPamela S, PA-C  levETIRAcetam (KEPPRA) 750 MG tablet Take 1,500 mg by mouth 2 (two) times daily. 10/04/19  Yes [provider]  lisinopril (ZESTRIL) 5 MG tablet Take 5 mg by mouth daily. 09/14/19  Yes [provider]  oxybutynin (DITROPAN) 5 MG tablet Take 1 tablet (5 mg total) by mouth 3 (three) times daily. Patient taking differently: Take 5 mg by mouth every 8 (eight) hours as needed for bladder spasms.  02/02/19  Yes Love, Evlyn KannerPamela S, PA-C  Probiotic CAPS  Take 1 capsule by mouth daily.   Yes [provider]  sulfamethoxazole-trimethoprim (BACTRIM DS) 800-160 MG tablet Take 1 tablet by mouth 2 (two) times daily.   Yes [provider]  Vitamin D, Cholecalciferol, 50 MCG (2000 UT) CAPS Take 1 capsule by mouth daily.   Yes [provider]  divalproex (DEPAKOTE ER) 500 MG 24 hr tablet Take 1 tablet (500 mg total) by mouth daily. Patient not taking: Reported on 10/19/2019 02/02/19   Love, Evlyn Kanneramela S, PA-C  hydrocortisone cream 1 % Apply to affected area 2 times daily Patient not taking: Reported on 10/19/2019 09/28/19   Bethann BerkshireZammit, Joseph, MD  liver oil-zinc oxide (DESITIN) 40 % ointment Apply topically 3 (three) times daily. Patient not taking: Reported on 10/19/2019 02/02/19   Love, Evlyn KannerPamela S, PA-C  metroNIDAZOLE (FLAGYL) 500 MG tablet Take 1 tablet (500 mg total) by mouth 2 (two) times daily. One po bid x 7 days Patient not taking: Reported on 10/19/2019 09/28/19   Bethann BerkshireZammit, Joseph, MD  modafinil (PROVIGIL) 100 MG tablet Take 1 tablet (100 mg total) by mouth daily. Patient not taking: Reported on 10/19/2019 02/02/19   Love, Evlyn KannerPamela S, PA-C  nystatin (MYCOSTATIN/NYSTOP) powder Apply topically 2 (two) times daily. Patient not taking: Reported on 10/19/2019 12/28/18   Layne BentonBiby, Sharon L, NP  pseudoephedrine (SUDAFED) 30 MG tablet Take 1 tablet (30 mg total) by mouth every 6 (six) hours as needed for congestion. Patient not taking: Reported on 10/19/2019 02/01/19   Love, Evlyn KannerPamela S, PA-C  scopolamine (TRANSDERM-SCOP) 1 MG/3DAYS Place 1 patch (1.5 mg total) onto the skin every 3 (three) days. Patient not taking: Reported on 10/19/2019 02/02/19   Love, Evlyn KannerPamela S, PA-C  senna (SENOKOT) 8.6 MG TABS tablet Take 1 tablet (8.6 mg total) by mouth daily. Patient not taking: Reported on 10/19/2019 02/02/19   Love, Evlyn KannerPamela S, PA-C  sulfamethoxazole-trimethoprim (BACTRIM DS) 800-160 MG tablet Take 1 tablet by mouth every 12 (twelve) hours. Patient not taking: Reported on  10/19/2019 02/02/19   Jacquelynn CreeLove, Pamela S, PA-C    Scheduled Meds: . atenolol  25 mg Oral BID  . cholecalciferol  2,000 Units Oral Daily  . enoxaparin (LOVENOX) injection  40 mg Subcutaneous Q24H  . Gerhardt's butt cream   Topical 6 X Daily  . lactobacillus acidophilus  2 tablet Oral Daily  . levETIRAcetam  1,500 mg Oral BID  . lisinopril  5 mg Oral Daily  . nystatin cream   Topical BID  . pantoprazole  40 mg Oral QHS   Infusions:  PRN Meds: acetaminophen **OR** acetaminophen, flavoxATE, ondansetron **OR** ondansetron (ZOFRAN) IV, oxybutynin   Allergies as of 10/19/2019 - Review Complete 10/19/2019  Allergen Reaction Noted  .  Latex Anaphylaxis and Hives 12/25/2018  . Penicillins Hives and Itching 12/25/2018    Family History  Problem Relation Age of Onset  . Stroke Mother        light  . Stroke Father   . CAD Father   . Heart murmur Sister     Social History   Socioeconomic History  . Marital status: Single    Spouse name: Not on file  . Number of children: Not on file  . Years of education: 6  . Highest education level: GED or equivalent  Occupational History  . Occupation: unemployed  Tobacco Use  . Smoking status: Never Smoker  . Smokeless tobacco: Never Used  Vaping Use  . Vaping Use: Never used  Substance and Sexual Activity  . Alcohol use: Not Currently  . Drug use: Never  . Sexual activity: Not Currently    Birth control/protection: None  Other Topics Concern  . Not on file  Social History Narrative  . Not on file   Social Determinants of Health   Financial Resource Strain:   . Difficulty of Paying Living Expenses:   Food Insecurity:   . Worried About Programme researcher, broadcasting/film/video in the Last Year:   . Barista in the Last Year:   Transportation Needs:   . Freight forwarder (Medical):   Marland Kitchen Lack of Transportation (Non-Medical):   Physical Activity:   . Days of Exercise per Week:   . Minutes of Exercise per Session:   Stress:   . Feeling of  Stress :   Social Connections:   . Frequency of Communication with Friends and Family:   . Frequency of Social Gatherings with Friends and Family:   . Attends Religious Services:   . Active Member of Clubs or Organizations:   . Attends Banker Meetings:   Marland Kitchen Marital Status:   Intimate Partner Violence:   . Fear of Current or Ex-Partner:   . Emotionally Abused:   Marland Kitchen Physically Abused:   . Sexually Abused:     REVIEW OF SYSTEMS: Constitutional: Patient is able to ambulate with the use of braces and crutches.  He has an adapted truck which he can drive.  Lives independently but his father lives within walking distance. ENT:  No nose bleeds Pulm: No shortness of breath or cough. CV:  No palpitations, no LE edema.  No chest pain GU:  No hematuria, no frequency.  Neurogenic bladder as above GI: See HPI. Heme: No excessive or unusual bleeding or bruising Transfusions: None Neuro:  No headaches, no peripheral tingling or numbness.  No syncope, no seizures. Derm: No pruritus.  Does have a sore on his gluteal fold. Endocrine:  No sweats or chills.  No polyuria or dysuria Immunization: Not queried Travel:  None beyond local counties in last few months.    PHYSICAL EXAM: Vital signs in last 24 hours: Vitals:   10/26/19 0505 10/26/19 1004  BP: 134/64 123/64  Pulse: 66 68  Resp: 17 18  Temp: 98.2 F (36.8 C) 97.8 F (36.6 C)  SpO2: 98% 100%   Wt Readings from Last 3 Encounters:  10/24/19 68.7 kg  01/16/19 65 kg  12/25/18 65.3 kg    General: Very pleasant, comfortable.  Does not look ill.  Physically disproportionate body due to spinal bifida Head: No asymmetry, facial edema or signs of head trauma. Eyes: No scleral icterus or conjunctival pallor. Ears: Not hard of hearing Nose: No congestion or discharge Mouth: Good dentition.  Tongue midline.  Mucosa pink, moist, clear. Neck: No JVD or thyromegaly. Lungs: Clear bilaterally.  No labored breathing or cough. Heart:  RRR.  No MRG.  S1, S2 present Abdomen: Soft, nontender, nondistended no HSM, bruits, hernias.  Palpable firm ridge in the left upper quadrant consistent with his VP shunt Rectal: Deferred Musc/Skeltl: No joint redness, swelling, gross deformity. Extremities: No CCE.  Legs foreshortened. Neurologic: Fully alert and oriented.  Excellent historian.  Moves all 4 limbs without tremors, strength was not tested. Skin: No rash, no sores, no telangiectasia. Tattoos: None observed Nodes: No cervical adenopathy Psych: Pleasant affect, calm, fluid speech.  Intake/Output from previous day: 06/30 0701 - 07/01 0700 In: 900 [P.O.:900] Out: 1023 [Urine:1020; Stool:3] Intake/Output this shift: Total I/O In: 200 [P.O.:200] Out: 251 [Urine:250; Stool:1]  LAB RESULTS: Recent Labs    10/24/19 0509 10/25/19 0446  WBC 7.5 6.6  HGB 11.9* 12.2*  HCT 37.7* 38.6*  PLT 301 275   BMET Lab Results  Component Value Date   NA 142 10/25/2019   NA 143 10/24/2019   NA 142 10/23/2019   K 4.0 10/25/2019   K 4.7 10/24/2019   K 4.1 10/23/2019   CL 109 10/25/2019   CL 112 (H) 10/24/2019   CL 111 10/23/2019   CO2 24 10/25/2019   CO2 22 10/24/2019   CO2 21 (L) 10/23/2019   GLUCOSE 103 (H) 10/25/2019   GLUCOSE 108 (H) 10/24/2019   GLUCOSE 114 (H) 10/23/2019   BUN 11 10/25/2019   BUN 11 10/24/2019   BUN 9 10/23/2019   CREATININE 0.62 10/25/2019   CREATININE 0.79 10/24/2019   CREATININE 0.85 10/23/2019   CALCIUM 8.7 (L) 10/25/2019   CALCIUM 8.7 (L) 10/24/2019   CALCIUM 8.7 (L) 10/23/2019   C-Diff No components found for: CDIFF Lipase     Component Value Date/Time   LIPASE 34 09/28/2019 1641    Drugs of Abuse  No results found for: LABOPIA, COCAINSCRNUR, LABBENZ, AMPHETMU, THCU, LABBARB   RADIOLOGY STUDIES: No results found.    IMPRESSION:   *    Diarrhea.  Despite the negative C. difficile labs, it certainly sounds like this may be C. Difficile,    PLAN:     *  ? Begin empiric  oral vancomycin Stop cefdinir and Flagyl. ? Pursue flex sig to investigate diarrhea and perform polyp screening?      Jennye Moccasin  10/26/2019, 12:20 PM Phone [phone]

## 2019-10-27 LAB — CBC
HCT: 38.6 % — ABNORMAL LOW (ref 39.0–52.0)
Hemoglobin: 12.1 g/dL — ABNORMAL LOW (ref 13.0–17.0)
MCH: 29.6 pg (ref 26.0–34.0)
MCHC: 31.3 g/dL (ref 30.0–36.0)
MCV: 94.4 fL (ref 80.0–100.0)
Platelets: 275 10*3/uL (ref 150–400)
RBC: 4.09 MIL/uL — ABNORMAL LOW (ref 4.22–5.81)
RDW: 15.3 % (ref 11.5–15.5)
WBC: 7.2 10*3/uL (ref 4.0–10.5)
nRBC: 0 % (ref 0.0–0.2)

## 2019-10-27 LAB — BASIC METABOLIC PANEL
Anion gap: 9 (ref 5–15)
BUN: 13 mg/dL (ref 6–20)
CO2: 25 mmol/L (ref 22–32)
Calcium: 8.9 mg/dL (ref 8.9–10.3)
Chloride: 107 mmol/L (ref 98–111)
Creatinine, Ser: 0.67 mg/dL (ref 0.61–1.24)
GFR calc Af Amer: >60 mL/min (ref >60)
GFR calc non Af Amer: >60 mL/min (ref >60)
Glucose, Bld: 99 mg/dL (ref 70–99)
Potassium: 4.3 mmol/L (ref 3.5–5.1)
Sodium: 141 mmol/L (ref 135–145)

## 2019-10-27 LAB — VITAMIN B12: Vitamin B-12: 201 pg/mL (ref 180–914)

## 2019-10-27 MED ORDER — CYANOCOBALAMIN 1000 MCG/ML IJ SOLN
1000.0000 ug | Freq: Once | INTRAMUSCULAR | Status: AC
  Administered 2019-10-27: 1000 ug via INTRAMUSCULAR
  Filled 2019-10-27: qty 1

## 2019-10-27 MED ORDER — PEG-KCL-NACL-NASULF-NA ASC-C 100 G PO SOLR
0.5000 | Freq: Once | ORAL | Status: AC
  Administered 2019-10-27: 100 g via ORAL
  Filled 2019-10-27: qty 1

## 2019-10-27 MED ORDER — L-METHYLFOLATE-B6-B12 3-35-2 MG PO TABS
1.0000 | ORAL_TABLET | Freq: Every day | ORAL | Status: DC
  Administered 2019-10-28 – 2019-11-08 (×12): 1 via ORAL
  Filled 2019-10-27 (×12): qty 1

## 2019-10-27 MED ORDER — ADULT MULTIVITAMIN W/MINERALS CH
1.0000 | ORAL_TABLET | Freq: Every day | ORAL | Status: DC
  Administered 2019-10-27 – 2019-11-08 (×13): 1 via ORAL
  Filled 2019-10-27 (×13): qty 1

## 2019-10-27 MED ORDER — PEG-KCL-NACL-NASULF-NA ASC-C 100 G PO SOLR: 1.0000 | Freq: Once | ORAL | Status: DC

## 2019-10-27 NOTE — Plan of Care (Signed)
  Problem: Education: Goal: Knowledge of General Education information will improve Description Including pain rating scale, medication(s)/side effects and non-pharmacologic comfort measures Outcome: Progressing   

## 2019-10-27 NOTE — Progress Notes (Signed)
Physical Therapy Treatment Patient Details Name: Carlos Ferguson MRN: [id number] DOB: 1966/03/26 Today's Date: 10/27/2019    History of Present Illness Pt is a 54 y/o male admitted secondary to diarrhea and has bilateral buttock wounds. Imaging revelaed colitis of ascending and transverse colon. PMH includes spina bifida, CVA, chiari malformation s/p shunt, CVA, seizure, and HTN.     PT Comments    Patient progressing with ambulation in hallway this session.  His gait is scary to watch, but evidently functional for him at home as he reports not using w/c but for distances and no falls.  He does admit to being weaker due to not keeping food in for long.  Notes he will have colonoscopy tomorrow so on liquid diet now.   Discussed needs to be seen over weekend to ensure able to get up steps for home entry.  PT to follow.  Now recommending HHPT as pt not willing to go to CIR. 2  Follow Up Recommendations  Home health PT     Equipment Recommendations  None recommended by PT    Recommendations for Other Services       Precautions / Restrictions Precautions Precautions: Fall Required Braces or Orthoses: Other Brace Other Brace: R AFO, L KAFO    Mobility  Bed Mobility   Bed Mobility: Supine to Sit     Supine to sit: Supervision;HOB elevated     General bed mobility comments: increased time, assist for safety scooting to EOB with height of bed elevated  Transfers Overall transfer level: Needs assistance Equipment used: Crutches Transfers: Sit to/from Stand Sit to Stand: Supervision;From elevated surface         General transfer comment: raised height of bed very high, lowered for return to sitting  Ambulation/Gait Ambulation/Gait assistance: Min guard;Supervision Gait Distance (Feet): 70 Feet Assistive device: Crutches Gait Pattern/deviations: Decreased stride length;Narrow base of support     General Gait Details: using trunk and heavy UE support on crutches with  reciprocating gait pattern.  Minguard for safety, but no overt LOB.   Stairs             Wheelchair Mobility    Modified Rankin (Stroke Patients Only)       Balance Overall balance assessment: Needs assistance   Sitting balance-Leahy Scale: Good     Standing balance support: Bilateral upper extremity supported;During functional activity Standing balance-Leahy Scale: Poor Standing balance comment: Reliant on bUE support                             Cognition Arousal/Alertness: Awake/alert Behavior During Therapy: WFL for tasks assessed/performed Overall Cognitive Status: Within Functional Limits for tasks assessed                                        Exercises      General Comments General comments (skin integrity, edema, etc.): Patient concerned about ongoing diarrhea so donned brief for ambulation, pt reports pulls himself up steps to his home (cannot lift his foot); reports when on rehab after his stroke they told him to stay in the w/c, but he doesn't want to use the w/c.      Pertinent Vitals/Pain Pain Assessment: Faces Faces Pain Scale: Hurts little more Pain Location: bottom  Pain Descriptors / Indicators: Tender;Sore Pain Intervention(s): Monitored during session    Home Living  Prior Function            PT Goals (current goals can now be found in the care plan section) Progress towards PT goals: Progressing toward goals    Frequency    Min 3X/week      PT Plan Discharge plan needs to be updated    Co-evaluation              AM-PAC PT "6 Clicks" Mobility   Outcome Measure  Help needed turning from your back to your side while in a flat bed without using bedrails?: None Help needed moving from lying on your back to sitting on the side of a flat bed without using bedrails?: None Help needed moving to and from a bed to a chair (including a wheelchair)?: A Little Help needed  standing up from a chair using your arms (e.g., wheelchair or bedside chair)?: A Little Help needed to walk in hospital room?: A Little Help needed climbing 3-5 steps with a railing? : A Lot 6 Click Score: 19    End of Session   Activity Tolerance: Patient tolerated treatment well Patient left: in bed;with call bell/phone within reach (EOB with lunch tray close, NT aware)   PT Visit Diagnosis: Muscle weakness (generalized) (M62.81);Other abnormalities of gait and mobility (R26.89)     Time: 1610-9604 PT Time Calculation (min) (ACUTE ONLY): 38 min  Charges:  $Gait Training: 8-22 mins $Therapeutic Activity: 8-22 mins                     Sheran Lawless, PT Acute Rehabilitation Services Pager:[phone] Office:[phone] 10/27/2019    [name] 10/27/2019, 1:35 PM

## 2019-10-27 NOTE — Progress Notes (Signed)
Inpatient Rehabilitation Admissions Coordinator  Therapy recommendations have changed to Home Health. No need for CIR referral.  Ottie Glazier, RN, MSN Rehab Admissions Coordinator [phone] 10/27/2019 5:05 PM

## 2019-10-27 NOTE — H&P (View-Only) (Signed)
° °         Daily Rounding Note  10/27/2019, 9:56 AM  LOS: 7 days   SUBJECTIVE:   Chief complaint: diarrhea    Loose stools continue, frequency slightly improved. C/o numbness in upper lip, started yesterday  OBJECTIVE:         Vital signs in last 24 hours:    Temp:  [97.7 F (36.5 C)-99 F (37.2 C)] 97.7 F (36.5 C) (07/02 0920) Pulse Rate:  [60-72] 70 (07/02 0920) Resp:  [16-18] 18 (07/02 0920) BP: (123-134)/(64-78) 134/77 (07/02 0920) SpO2:  [97 %-100 %] 99 % (07/02 0920) Last BM Date: 10/26/19 Filed Weights   10/20/19 0542 10/24/19 2054  Weight: 69.4 kg 68.7 kg   General: looks well, comfortable, alert Did not physically re-examine pt   Heart:   Chest: no labored breathing Abdomen: ND  Extremities: no edema Neuro/Psych:  Alert, oriented x 3.  No tremors.  Fluid speech.    Intake/Output from previous day: 07/01 0701 - 07/02 0700 In: 680 [P.O.:680] Out: 1151 [Urine:1150; Stool:1]  Intake/Output this shift: Total I/O In: 450 [P.O.:450] Out: -   Lab Results: Recent Labs    10/25/19 0446  WBC 6.6  HGB 12.2*  HCT 38.6*  PLT 275   BMET Recent Labs    10/25/19 0446  NA 142  K 4.0  CL 109  CO2 24  GLUCOSE 103*  BUN 11  CREATININE 0.62  CALCIUM 8.7*    Studies/Results: No results found.  ASSESMENT:   *   Diarrhea.  Ascending and transverse colitis per CT.  Cdiff, stool tox PCR both negative.       PLAN   *   colonoscopy tmrw AM    Clears in place.  Split moviprep starting this PM Hold Lovenox until tmrw afternoon      Jennye Moccasin  10/27/2019, 9:56 AM Phone [phone]

## 2019-10-27 NOTE — Progress Notes (Signed)
Inpatient Rehabilitation Admissions Coordinator  Rehab Admissions Coordinator Note:  Patient was screened by Clois Dupes for appropriateness for an Inpatient Acute Rehab Consult per PT recs. Noted ongoing medical workup . If patient does not progress to baseline to be able to return home, Please consider a possible CIR consult for possible admit.Clois Dupes RN MSN 10/27/2019, 8:15 AM  I can be reached at [phone].

## 2019-10-27 NOTE — Progress Notes (Signed)
OT Evaluation:  Clinical Impressions: PTA, pt lives alone (dad lives nearby) and reports Modified Independence with ADLs, IADLs, and mobility using crutches for short distances and wheelchair for longer distances. On OT entry, pt reports preference for returning home rather than inpatient rehab. Collaborated with pt on what he needs to be able to do to go home safely and allowed him to show OT how he completes tasks at home. Pt overall Supervision for bed mobility and sit to stand at bedside with crutches. Pt Min A to don B LE braces and shoes bed level (reports he sits on floor to do this at home). Guided pt in SPT to [facility] with crutches and min guard to ensure stability. Pt then completed short distance mobility to sink and brushed teeth, leaning against sink (reports he does this at baseline). Overall pt appears to be close to baseline. His mobility is unique to him and his conditions, but has been working for him without too much difficulty. Suspect once pt's diarrhea ceases and he p.o. intake improves, his strength and overall functional abilities will improve to baseline. Recommend HHOT to follow up with pt at home to maximize overall safety and decrease fall risk. Will continue to follow acutely to increase independence for safe DC home.    10/27/19 0830  OT Visit Information  Last OT Received On 10/27/19  Assistance Needed +1  History of Present Illness Pt is a 54 y/o male admitted secondary to diarrhea and has bilateral buttock wounds. Imaging revelaed colitis of ascending and transverse colon. PMH includes spina bifida, CVA, chiari malformation s/p shunt, CVA, seizure, and HTN.   Precautions  Precautions Fall  Required Braces or Orthoses Other Brace  Other Brace Has bilateral LE braces.   Restrictions  Weight Bearing Restrictions No  Home Living  Family/patient expects to be discharged to: Private residence  Living Arrangements Alone  Available Help at Discharge Family (dad lives next  door)  Type of Home House  Home Access Stairs to enter  Entrance Stairs-Number of Steps 4  Entrance Stairs-Rails Right  Home Layout One level  Bathroom Shower/Tub Tub/shower unit;Curtain  Horticulturist, commercial Yes  How Accessible Accessible via walker  Home Equipment Crutches;Wheelchair - manual  Prior Function  Level of Independence Independent with assistive device(s)  Comments Pt reports Modified Independent for ADLs, IADLs in the home. Uses crutches and braces for short distance ambulation. Uses WC for longer distances when he goes to car shows. Driving.   Communication  Communication No difficulties  Pain Assessment  Pain Assessment No/denies pain  Cognition  Arousal/Alertness Awake/alert  Behavior During Therapy WFL for tasks assessed/performed  Overall Cognitive Status Within Functional Limits for tasks assessed  Upper Extremity Assessment  Upper Extremity Assessment Generalized weakness  Lower Extremity Assessment  Lower Extremity Assessment Defer to PT evaluation  Cervical / Trunk Assessment  Cervical / Trunk Assessment Kyphotic  ADL  Overall ADL's  Needs assistance/impaired  Eating/Feeding Independent  Grooming Min guard;Supervision/safety;Standing;Oral care  Grooming Details (indicate cue type and reason) Pt progressed from min guard to supervision standing at sink  while brushing teeth. Pt with tendency to lean on sink for support, but reports he does this at baseline  Upper Body Bathing Set up;Sitting  Lower Body Bathing Minimal assistance;Sit to/from stand  Upper Body Dressing  Set up;Sitting  Lower Body Dressing Minimal assistance;Sit to/from stand;Bed level  Lower Body Dressing Details (indicate cue type and reason) Pt Min A to don B LE braces  and shoes bed level. Pt reports typically doing this while seated on floor at home  Toilet Transfer Stand-pivot;BSC;Min guard (with crutches)  Toilet Transfer Details (indicate cue type and  reason) Pt min guard for stand pivot to [facility] with crutches. Support to stabilize [facility] for pt to slowly lower self down to seat  Toileting- Clothing Manipulation and Hygiene Minimal assistance;Sit to/from stand  Functional mobility during ADLs Min guard;Cueing for sequencing (crutches)  General ADL Comments Pt with generalized weakness from weeks of diarhhea and dehydration. Pt not far from baseline, but once improved intake/output, suspect pt will be back to baseline  Vision- History  Baseline Vision/History Wears glasses  Wears Glasses Reading only  Patient Visual Report No change from baseline  Vision- Assessment  Vision Assessment? No apparent visual deficits  Bed Mobility  Overal bed mobility Needs Assistance  Bed Mobility Supine to Sit;Sit to Supine  Supine to sit Supervision;HOB elevated  Sit to supine Supervision;HOB elevated  General bed mobility comments Supervision to ensure safety. Allowed pt to show OT how he transfers from bed  Transfers  Overall transfer level Needs assistance  Equipment used Crutches  Transfers Sit to/from Stand;Stand Pivot Transfers  Sit to Stand Supervision;From elevated surface  Stand pivot transfers From elevated surface;Min guard  General transfer comment Pt overall Supervision for sit to stand from higher bed with OT foot support to prevent crutches from sliding. Pt min guard for stand pivot with intermittent cues for pacing  Balance  Overall balance assessment Needs assistance  Sitting-balance support Feet supported  Sitting balance-Leahy Scale Good  Standing balance support Bilateral upper extremity supported;During functional activity  Standing balance-Leahy Scale Poor  Standing balance comment Reliant on bUE support   General Comments  General comments (skin integrity, edema, etc.) Extended time spent discussing PLOF vs CLOF with pt. Pt reports desire to return home after considering inpatient rehab, as he wants to remain as independent as  possible with his crutches rather than rehab's encouragement to use wheelchair. Pt overall with good problem solving and safety awareness (acknowledging obstacles in path that create safety hazard)  OT - End of Session  Equipment Utilized During Treatment Gait belt;Other (comment) (crutches)  Activity Tolerance Patient tolerated treatment well  Patient left in bed;with call bell/phone within reach  Nurse Communication Mobility status  OT Assessment  OT Recommendation/Assessment Patient needs continued OT Services  OT Visit Diagnosis Unsteadiness on feet (R26.81);Muscle weakness (generalized) (M62.81);History of falling (Z91.81)  OT Problem List Decreased strength;Impaired balance (sitting and/or standing);Decreased activity tolerance  OT Plan  OT Frequency (ACUTE ONLY) Min 2X/week  OT Treatment/Interventions (ACUTE ONLY) Self-care/ADL training;Therapeutic exercise;Energy conservation;Therapeutic activities;Patient/family education  AM-PAC OT "6 Clicks" Daily Activity Outcome Measure (Version 2)  Help from another person eating meals? 4  Help from another person taking care of personal grooming? 3  Help from another person toileting, which includes using toliet, bedpan, or urinal? 3  Help from another person bathing (including washing, rinsing, drying)? 3  Help from another person to put on and taking off regular upper body clothing? 3  Help from another person to put on and taking off regular lower body clothing? 3  6 Click Score 19  OT Recommendation  Follow Up Recommendations Home health OT;Supervision - Intermittent  OT Equipment None recommended by OT (has all needed DME)  Individuals Consulted  Consulted and Agree with Results and Recommendations Patient  Acute Rehab OT Goals  Patient Stated Goal be able to go home, resolve stomach issues  OT  Goal Formulation With patient  Time For Goal Achievement 11/10/19  Potential to Achieve Goals Good  OT Time Calculation  OT Start Time  (ACUTE ONLY) 0809  OT Stop Time (ACUTE ONLY) 0849  OT Time Calculation (min) 40 min  OT General Charges  $OT Visit 1 Visit  OT Evaluation  $OT Eval Moderate Complexity 1 Mod  OT Treatments  $Self Care/Home Management  8-22 mins  $Therapeutic Activity 8-22 mins  Written Expression  Dominant Hand Right

## 2019-10-27 NOTE — Anesthesia Preprocedure Evaluation (Addendum)
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    Pulmonary neg pulmonary ROS,    Pulmonary exam normal breath sounds clear to auscultation       Cardiovascular hypertension, Normal cardiovascular exam Rhythm:Regular Rate:Normal     Neuro/Psych Seizures -,  negative psych ROS   GI/Hepatic negative GI ROS, Neg liver ROS,   Endo/Other  negative endocrine ROS  Renal/GU negative Renal ROS  negative genitourinary   Musculoskeletal negative musculoskeletal ROS (+)   Abdominal   Peds negative pediatric ROS (+)  Hematology negative hematology ROS (+)   Anesthesia Other Findings   Reproductive/Obstetrics negative OB ROS                             Anesthesia Physical Anesthesia Plan  ASA: III  Anesthesia Plan: MAC   Post-op Pain Management:    Induction: Intravenous  PONV Risk Score and Plan: 0  Airway Management Planned: Simple Face Mask  Additional Equipment:   Intra-op Plan:   Post-operative Plan:   Informed Consent: I have reviewed the patients History and Physical, chart, labs and discussed the procedure including the risks, benefits and alternatives for the proposed anesthesia with the patient or authorized representative who has indicated his/her understanding and acceptance.     Dental advisory given  Plan Discussed with: CRNA and Surgeon  Anesthesia Plan Comments:          Anesthesia Quick Evaluation

## 2019-10-27 NOTE — Progress Notes (Signed)
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 -approximately 5 weeks or so ago he was started on an antibiotic therapy for suspected catheter associated urinary tract infection.  Around this time, patient states that he began to develop diarrhea.  Patient explains that this diarrhea is watery with occasional small amounts of blood.  Diarrhea occurs up to 15 times daily. -In the weeks that followed, patient was placed on courses of both Bactrim and nitrofurantoin for suspected urinary tract infection.   -Presented to [facility] room, work-up in the ED noted concern for ascending and transverse colitis, also noted to have low-grade temp of 100.5 and a white count 13.9, in addition noted to have full-thickness wounds on his bottom with tissue loss  Assessment & Plan:   Colitis Subacute diarrhea -CT did note colitis of ascending and transverse colon -C. difficile PCR screen negative x1, repeat ordered was canceled by the lab per Dr. Broadus John -GI pathogen panel was negative  -Treated with IV ceftriaxone and Flagyl, day 5 today, changed to oral cephalosporin and Flagyl-- d/c abx -given a dose of imodium  -asked RN to monitor stool output/frequency -GI consult apprecaited as very slow to improve-- plan for colonoscopy in the AM  Multiple full-thickness buttock wounds with tissue loss -Present on admission -Worsened in the setting of ongoing diarrhea, MASD -Attempt to keep this clean and dry -Continue local care, appreciate wound care consult -pain  improved  Essential hypertension -Continue home regimen of atenolol  Seizure disorder Spina bifida -Continue Keppra  Neurogenic bladder -Continue self-catheterization per home regimen  Esophagitis -Started PPI  Numbness above upper lip -? nutritional deficit -no wounds/lesions so doubt zoster as crosses midline    DVT prophylaxis: Lovenox Code Status: Full code Family Communication: No family at bedside Disposition Plan:  Status is: Inpatient  Remains inpatient appropriate because:Inpatient level of care appropriate due to severity of illness, Home pending improvement in diarrhea, patient is physically limited by his spina bifida and disabilities with sacral wounds and ongoing diarrhea   Dispo: The patient is from: Home              Anticipated d/c is to: Home              Anticipated d/c date is: 1-2              Patient currently is not medically stable to d/c.   Subjective: C/o of burning in his groin  Objective: Vitals:   10/26/19 1712 10/26/19 2055 10/27/19 0600 10/27/19 0920  BP: 128/70 127/75 128/78 134/77  Pulse: 65 72 60 70  Resp: 18 16 16 18   Temp: 99 F (37.2 C) 98.8 F (37.1 C) 98.4 F (36.9 C) 97.7 F (36.5 C)  TempSrc: Oral Oral Oral Oral  SpO2: 97% 98% 98% 99%  Weight:      Height:        Intake/Output Summary (Last 24 hours) at 10/27/2019 1344 Last data filed at 10/27/2019 0900 Gross per 24 hour  Intake 930 ml  Output 900 ml  Net 30 ml   Filed Weights   10/20/19 0542 10/24/19 2054  Weight:  69.4 kg 68.7 kg    Examination:  General: Appearance:     Overweight male in no acute distress  Skin: No lesions/sores above lips  Lungs:     Clear to auscultation bilaterally, respirations unlabored  Heart:    Normal heart rate. Normal rhythm. No murmurs, rubs, or gallops.      Neurologic:   Awake, alert, oriented x 3.     Data Reviewed:   CBC: Recent Labs  Lab 10/21/19 0326 10/21/19 0326 10/22/19 0416 10/23/19 0507 10/24/19 0509  10/25/19 0446 10/27/19 1141  WBC 5.6   < > 5.1 6.0 7.5 6.6 7.2  NEUTROABS 3.8  --   --   --   --   --   --   HGB 10.8*   < > 10.6* 13.0 11.9* 12.2* 12.1*  HCT 34.0*   < > 33.3* 39.3 37.7* 38.6* 38.6*  MCV 93.4   < > 93.5 93.6 95.2 94.4 94.4  PLT 263   < > 291 383 301 275 275   < > = values in this interval not displayed.   Basic Metabolic Panel: Recent Labs  Lab 10/21/19 0326 10/21/19 0326 10/22/19 0416 10/23/19 0507 10/24/19 0509 10/25/19 0446 10/27/19 1141  NA 141   < > 142 142 143 142 141  K 4.4   < > 4.6 4.1 4.7 4.0 4.3  CL 107   < > 111 111 112* 109 107  CO2 21*   < > 22 21* 22 24 25   GLUCOSE 95   < > 125* 114* 108* 103* 99  BUN 6   < > 9 9 11 11 13   CREATININE 0.71   < > 0.75 0.85 0.79 0.62 0.67  CALCIUM 8.6*   < > 8.3* 8.7* 8.7* 8.7* 8.9  MG 2.1  --   --   --   --   --   --    < > = values in this interval not displayed.   GFR: Estimated Creatinine Clearance: 87.8 mL/min (by C-G formula based on SCr of 0.67 mg/dL). Liver Function Tests: Recent Labs  Lab 10/21/19 0326  AST 15  ALT 29  ALKPHOS 66  BILITOT 0.6  PROT 6.2*  ALBUMIN 2.4*   No results for input(s): LIPASE, AMYLASE in the last 168 hours. No results for input(s): AMMONIA in the last 168 hours. Coagulation Profile: No results for input(s): INR, PROTIME in the last 168 hours. Cardiac Enzymes: No results for input(s): CKTOTAL, CKMB, CKMBINDEX, TROPONINI in the last 168 hours. BNP (last 3 results) No results for input(s): PROBNP in the last 8760 hours. HbA1C: No results for input(s): HGBA1C in the last 72 hours. CBG: Recent Labs  Lab 10/21/19 1150  GLUCAP 77   Lipid Profile: No results for input(s): CHOL, HDL, LDLCALC, TRIG, CHOLHDL, LDLDIRECT in the last 72 hours. Thyroid Function Tests: No results for input(s): TSH, T4TOTAL, FREET4, T3FREE, THYROIDAB in the last 72 hours. Anemia Panel: Recent Labs    10/27/19 1141  VITAMINB12 201   Urine analysis:    Component Value Date/Time    COLORURINE AMBER (A) 10/19/2019 1937   APPEARANCEUR HAZY (A) 10/19/2019 1937   LABSPEC 1.026 10/19/2019 1937   PHURINE 5.0 10/19/2019 1937   GLUCOSEU NEGATIVE 10/19/2019 1937   HGBUR NEGATIVE 10/19/2019 1937   BILIRUBINUR NEGATIVE 10/19/2019 [place] NEGATIVE 10/19/2019 1937   PROTEINUR NEGATIVE 10/19/2019 1937   NITRITE NEGATIVE 10/19/2019 1937   LEUKOCYTESUR NEGATIVE 10/19/2019 1937    ) Recent  Results (from the past 240 hour(s))  Urine culture     Status: None   Collection Time: 10/19/19  7:37 PM   Specimen: Urine, Random  Result Value Ref Range Status   Specimen Description URINE, RANDOM  Final   Special Requests NONE  Final   Culture   Final    NO GROWTH Performed at [facility], [address], [place], [place] [address]    Report Status 10/21/2019 FINAL  Final  Gastrointestinal Panel by PCR , Stool     Status: None   Collection Time: 10/19/19  7:37 PM   Specimen: Urine, Catheterized; Stool  Result Value Ref Range Status   Campylobacter species NOT DETECTED NOT DETECTED Final   Plesimonas shigelloides NOT DETECTED NOT DETECTED Final   Salmonella species NOT DETECTED NOT DETECTED Final   Yersinia enterocolitica NOT DETECTED NOT DETECTED Final   Vibrio species NOT DETECTED NOT DETECTED Final   Vibrio cholerae NOT DETECTED NOT DETECTED Final   Enteroaggregative E coli (EAEC) NOT DETECTED NOT DETECTED Final   Enteropathogenic E coli (EPEC) NOT DETECTED NOT DETECTED Final   Enterotoxigenic E coli (ETEC) NOT DETECTED NOT DETECTED Final   Shiga like toxin producing E coli (STEC) NOT DETECTED NOT DETECTED Final   Shigella/Enteroinvasive E coli (EIEC) NOT DETECTED NOT DETECTED Final   Cryptosporidium NOT DETECTED NOT DETECTED Final   Cyclospora cayetanensis NOT DETECTED NOT DETECTED Final   Entamoeba histolytica NOT DETECTED NOT DETECTED Final   Giardia lamblia NOT DETECTED NOT DETECTED Final   Adenovirus F40/41 NOT DETECTED NOT DETECTED Final   Astrovirus  NOT DETECTED NOT DETECTED Final   Norovirus GI/GII NOT DETECTED NOT DETECTED Final   Rotavirus A NOT DETECTED NOT DETECTED Final   Sapovirus (I, II, IV, and V) NOT DETECTED NOT DETECTED Final    Comment: Performed at [facility], [address] [place], [place] [address]  C Difficile Quick Screen w PCR reflex     Status: None   Collection Time: 10/19/19  7:37 PM   Specimen: Urine, Catheterized; Stool  Result Value Ref Range Status   C Diff antigen NEGATIVE NEGATIVE Final   C Diff toxin NEGATIVE NEGATIVE Final   C Diff interpretation No C. difficile detected.  Final    Comment: Performed at [facility], [place] [address] [place], [place] [address]  Culture, blood (routine x 2)     Status: None   Collection Time: 10/19/19  8:51 PM   Specimen: BLOOD  Result Value Ref Range Status   Specimen Description BLOOD LEFT ANTECUBITAL  Final   Special Requests   Final    BOTTLES DRAWN AEROBIC AND ANAEROBIC Blood Culture adequate volume   Culture   Final    NO GROWTH 5 DAYS Performed at [facility], [place] [address], [place], [place] [address]    Report Status 10/24/2019 FINAL  Final  Culture, blood (routine x 2)     Status: None   Collection Time: 10/19/19  9:12 PM   Specimen: BLOOD RIGHT HAND  Result Value Ref Range Status   Specimen Description BLOOD RIGHT HAND  Final   Special Requests   Final    BOTTLES DRAWN AEROBIC AND ANAEROBIC Blood Culture results may not be optimal due to an inadequate volume of blood received in culture bottles   Culture   Final    NO GROWTH 5 DAYS Performed at [facility], [place] [address], [place], [place] [address]  Report Status 10/24/2019 FINAL  Final  SARS Coronavirus 2 by RT PCR (hospital order, performed in [facility] hospital lab) Nasopharyngeal Nasopharyngeal Swab     Status: None   Collection Time: 10/20/19  1:55 AM   Specimen: Nasopharyngeal Swab  Result Value Ref Range Status   SARS Coronavirus 2 NEGATIVE NEGATIVE  Final    Comment: (NOTE) SARS-CoV-2 target nucleic acids are NOT DETECTED.  The SARS-CoV-2 RNA is generally detectable in upper and lower respiratory specimens during the acute phase of infection. The lowest concentration of SARS-CoV-2 viral copies this assay can detect is 250 copies / mL. A negative result does not preclude SARS-CoV-2 infection and should not be used as the sole basis for treatment or other patient management decisions.  A negative result may occur with improper specimen collection / handling, submission of specimen other than nasopharyngeal swab, presence of viral mutation(s) within the areas targeted by this assay, and inadequate number of viral copies (<250 copies / mL). A negative result must be combined with clinical observations, patient history, and epidemiological information.  Fact Sheet for Patients:   StrictlyIdeas.no  Fact Sheet for Healthcare Providers: BankingDealers.co.za  This test is not yet approved or  cleared by the [place] FDA and has been authorized for detection and/or diagnosis of SARS-CoV-2 by FDA under an Emergency Use Authorization (EUA).  This EUA will remain in effect (meaning this test can be used) for the duration of the COVID-19 declaration under Section 564(b)(1) of the Act, 21 U.S.C. section 360bbb-3(b)(1), unless the authorization is terminated or revoked sooner.  Performed at [facility], [place] [address], [place], [place] [address]          Radiology Studies: No results found.      Scheduled Meds:  atenolol  25 mg Oral BID   cholecalciferol  2,000 Units Oral Daily   [START ON 10/28/2019] enoxaparin (LOVENOX) injection  40 mg Subcutaneous Q24H   Gerhardt's butt cream   Topical 6 X Daily   lactobacillus acidophilus  2 tablet Oral Daily   levETIRAcetam  1,500 mg Oral BID   lisinopril  5 mg Oral Daily   multivitamin with minerals  1 tablet Oral Daily     nystatin cream   Topical BID   pantoprazole  40 mg Oral QHS   peg 3350 powder  0.5 kit Oral Once   And   peg 3350 powder  0.5 kit Oral Once   Continuous Infusions:    LOS: 7 days    Time spent: 22mn  JEulogio BearTriad Hospitalists 10/27/2019, 1:44 PM

## 2019-10-27 NOTE — Progress Notes (Deleted)
PT Cancellation Note  Patient Details Name: Carlos Ferguson MRN: [id number] DOB: 10-16-1965   Cancelled Treatment:        Carlos Ferguson 10/27/2019, 1:35 PM

## 2019-10-27 NOTE — Progress Notes (Signed)
          Daily Rounding Note ° °10/27/2019, 9:56 AM ° LOS: 7 days  ° °SUBJECTIVE:   °Chief complaint: diarrhea    °Loose stools continue, frequency slightly improved. °C/o numbness in upper lip, started yesterday ° °OBJECTIVE:        ° Vital signs in last 24 hours:    °Temp:  [97.7 °F (36.5 °C)-99 °F (37.2 °C)] 97.7 °F (36.5 °C) (07/02 0920) °Pulse Rate:  [60-72] 70 (07/02 0920) °Resp:  [16-18] 18 (07/02 0920) °BP: (123-134)/(64-78) 134/77 (07/02 0920) °SpO2:  [97 %-100 %] 99 % (07/02 0920) °Last BM Date: 10/26/19 °Filed Weights  ° 10/20/19 0542 10/24/19 2054  °Weight: 69.4 kg 68.7 kg  ° °General: looks well, comfortable, alert °Did not physically re-examine pt   °Heart:   °Chest: no labored breathing °Abdomen: ND  °Extremities: no edema °Neuro/Psych:  Alert, oriented x 3.  No tremors.  Fluid speech.   ° °Intake/Output from previous day: °07/01 0701 - 07/02 0700 °In: 680 [P.O.:680] °Out: 1151 [Urine:1150; Stool:1] ° °Intake/Output this shift: °Total I/O °In: 450 [P.O.:450] °Out: -  ° °Lab Results: °Recent Labs  °  10/25/19 °0446  °WBC 6.6  °HGB 12.2*  °HCT 38.6*  °PLT 275  ° °BMET °Recent Labs  °  10/25/19 °0446  °NA 142  °K 4.0  °CL 109  °CO2 24  °GLUCOSE 103*  °BUN 11  °CREATININE 0.62  °CALCIUM 8.7*  ° ° °Studies/Results: °No results found. ° °ASSESMENT:  ° °*   Diarrhea.  Ascending and transverse colitis per CT.  Cdiff, stool tox PCR both negative.     ° ° °PLAN  ° °*   colonoscopy tmrw AM    °Clears in place.  Split moviprep starting this PM °Hold Lovenox until tmrw afternoon   ° ° ° °Carlos Ferguson  10/27/2019, 9:56 AM °Phone [phone] °

## 2019-10-27 NOTE — Plan of Care (Signed)

## 2019-10-28 ENCOUNTER — Inpatient Hospital Stay (HOSPITAL_COMMUNITY): Payer: Medicare Other | Admitting: Anesthesiology

## 2019-10-28 ENCOUNTER — Encounter (HOSPITAL_COMMUNITY)
Admission: EM | Disposition: A | Discharge status: Skilled Nursing Facility | Payer: Self-pay | Source: Home / Self Care | Attending: Internal Medicine

## 2019-10-28 ENCOUNTER — Encounter (HOSPITAL_COMMUNITY): Payer: Self-pay | Admitting: Internal Medicine

## 2019-10-28 DIAGNOSIS — K635 Polyp of colon: Secondary | ICD-10-CM

## 2019-10-28 DIAGNOSIS — K529 Noninfective gastroenteritis and colitis, unspecified: Secondary | ICD-10-CM

## 2019-10-28 HISTORY — PX: COLONOSCOPY WITH PROPOFOL: SHX5780

## 2019-10-28 HISTORY — PX: POLYPECTOMY: SHX5525

## 2019-10-28 HISTORY — PX: BIOPSY: SHX5522

## 2019-10-28 SURGERY — COLONOSCOPY WITH PROPOFOL
Anesthesia: Monitor Anesthesia Care

## 2019-10-28 MED ORDER — LACTATED RINGERS IV SOLN: INTRAVENOUS | Status: DC

## 2019-10-28 MED ORDER — PHENYLEPHRINE HCL (PRESSORS) 10 MG/ML IV SOLN
INTRAVENOUS | Status: DC | PRN
  Administered 2019-10-28: 100 ug via INTRAVENOUS

## 2019-10-28 MED ORDER — PROPOFOL 500 MG/50ML IV EMUL
INTRAVENOUS | Status: DC | PRN
  Administered 2019-10-28: 100 ug/kg/min via INTRAVENOUS

## 2019-10-28 MED ORDER — LACTATED RINGERS IV SOLN: INTRAVENOUS | Status: DC | PRN

## 2019-10-28 MED ORDER — LIDOCAINE HCL (CARDIAC) PF 100 MG/5ML IV SOSY
PREFILLED_SYRINGE | INTRAVENOUS | Status: DC | PRN
Start: 2019-10-28 — End: 2019-10-28
  Administered 2019-10-28: 30 mg via INTRAVENOUS

## 2019-10-28 SURGICAL SUPPLY — 22 items

## 2019-10-28 NOTE — Interval H&P Note (Signed)
History and Physical Interval Note:  10/28/2019 8:10 AM  Carlos Ferguson  has presented today for surgery, with the diagnosis of diarrhea, colitis, first ever screening colonoscopy.  The various methods of treatment have been discussed with the patient and family. After consideration of risks, benefits and other options for treatment, the patient has consented to  Procedure(s): COLONOSCOPY WITH PROPOFOL (N/A) as a surgical intervention.  The patient's history has been reviewed, patient examined, no change in status, stable for surgery.  I have reviewed the patient's chart and labs.  Questions were answered to the patient's satisfaction.     Tressia Danas

## 2019-10-28 NOTE — Anesthesia Procedure Notes (Signed)
Procedure Name: MAC Date/Time: 10/28/2019 8:15 AM Performed by: Eligha Bridegroom, CRNA Pre-anesthesia Checklist: Patient identified, Emergency Drugs available, Suction available, Patient being monitored and Timeout performed Patient Re-evaluated:Patient Re-evaluated prior to induction Oxygen Delivery Method: Nasal cannula Preoxygenation: Pre-oxygenation with 100% oxygen Induction Type: IV induction

## 2019-10-28 NOTE — Progress Notes (Signed)
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 -approximately 5 weeks or so ago he was started on an antibiotic therapy for suspected catheter associated urinary tract infection.  Around this time, patient states that he began to develop diarrhea.  Patient explains that this diarrhea is watery with occasional small amounts of blood.  Diarrhea occurs up to 15 times daily. -In the weeks that followed, patient was placed on courses of both Bactrim and nitrofurantoin for suspected urinary tract infection.   -[facility], work-up in the ED noted concern for ascending and transverse colitis, also noted to have low-grade temp of 100.5 and a white count 13.9, in addition noted to have full-thickness wounds on his bottom with tissue loss  Assessment & Plan:   Colitis Subacute diarrhea -CT did note colitis of ascending and transverse colon -C. difficile PCR screen negative x1, repeat ordered was canceled by the lab per Dr. Jomarie LongsJoseph -GI pathogen panel was negative  -Treated with IV ceftriaxone and Flagyl, day 5 today, changed to oral cephalosporin and Flagyl-- d/c abx -given a dose of imodium  -asked RN to monitor stool output/frequency -GI consult appreciated:-- s/p colonoscopy pm 7/3; biopsy pending  Multiple full-thickness buttock wounds with tissue loss -Present on admission -Worsened in the setting of ongoing diarrhea, MASD -Attempt to keep this clean and dry -Continue local care, appreciate wound care consult -pain  improved  Essential hypertension -Continue home regimen of atenolol  Seizure disorder Spina bifida -Continue Keppra  Neurogenic bladder -Continue self-catheterization per home regimen  Esophagitis -Started PPI  Numbness above upper lip -? nutritional deficit -no wounds/lesions so doubt zoster as crosses midline -not clear as seems to move around  Low b12 -replete   DVT prophylaxis: Lovenox Code Status: Full code Family Communication:  family at bedside Disposition Plan:  Status is: Inpatient  Remains inpatient appropriate because:Inpatient level of care appropriate due to severity of illness, Home pending improvement in diarrhea, patient is physically limited by his spina bifida and disabilities with sacral wounds and ongoing diarrhea   Dispo: The patient is from: Home              Anticipated d/c is to: Home              Anticipated d/c date is: 1-2              Patient currently is not medically stable to d/c.   Subjective: Says worst part of colonoscopy was the prep last night  Objective: Vitals:   10/28/19 0745 10/28/19 0852 10/28/19 0907 10/28/19 0947  BP: 131/64 116/68 117/71 129/66  Pulse: 68 85 70 65  Resp: 13 19 12 20   Temp: 98.3 F (36.8 C) (!) 97.4 F (36.3 C) 97.6 F (36.4 C) 97.7 F (36.5 C)  TempSrc: Oral   Oral  SpO2: 99% 99% 100% 99%  Weight:      Height:        Intake/Output Summary (Last 24 hours) at 10/28/2019 1327 Last data filed at 10/28/2019 1221 Gross per 24 hour  Intake 1823 ml  Output 900 ml  Net 923 ml  Filed Weights   10/20/19 0542 10/24/19 2054 10/27/19 2105  Weight: 69.4 kg 68.7 kg 68.7 kg    Examination:  General: Appearance:     Overweight male in no acute distress     Lungs:     Clear to auscultation bilaterally, respirations unlabored  Heart:    Normal heart rate. Normal rhythm. No murmurs, rubs, or gallops.   MS:   All extremities are intact.   Neurologic:   Awake, alert, oriented x 3. No apparent focal  neurological           defect.    Data Reviewed:   CBC: Recent Labs  Lab 10/22/19 0416 10/23/19 0507 10/24/19 0509 10/25/19 0446 10/27/19 1141  WBC 5.1 6.0 7.5 6.6 7.2  HGB 10.6* 13.0 11.9* 12.2* 12.1*  HCT 33.3* 39.3 37.7* 38.6* 38.6*  MCV 93.5 93.6 95.2 94.4 94.4  PLT 291 383 301 275 275   Basic Metabolic Panel: Recent Labs  Lab 10/22/19 0416 10/23/19 0507 10/24/19 0509 10/25/19 0446 10/27/19 1141  NA 142 142 143 142 141  K 4.6 4.1 4.7 4.0 4.3  CL 111 111 112* 109 107  CO2 22 21* 22 24 25   GLUCOSE 125* 114* 108* 103* 99  BUN 9 9 11 11 13   CREATININE 0.75 0.85 0.79 0.62 0.67  CALCIUM 8.3* 8.7* 8.7* 8.7* 8.9   GFR: Estimated Creatinine Clearance: 87.8 mL/min (by C-G formula based on SCr of 0.67 mg/dL). Liver Function Tests: No results for input(s): AST, ALT, ALKPHOS, BILITOT, PROT, ALBUMIN in the last 168 hours. No results for input(s): LIPASE, AMYLASE in the last 168 hours. No results for input(s): AMMONIA in the last 168 hours. Coagulation Profile: No results for input(s): INR, PROTIME in the last 168 hours. Cardiac Enzymes: No results for input(s): CKTOTAL, CKMB, CKMBINDEX, TROPONINI in the last 168 hours. BNP (last 3 results) No results for input(s): PROBNP in the last 8760 hours. HbA1C: No results for input(s): HGBA1C in the last 72 hours. CBG: No results for input(s): GLUCAP in the last 168 hours. Lipid Profile: No results for input(s): CHOL, HDL, LDLCALC, TRIG, CHOLHDL, LDLDIRECT in the last 72 hours. Thyroid Function Tests: No results for input(s): TSH, T4TOTAL, FREET4, T3FREE, THYROIDAB in the last 72 hours. Anemia Panel: Recent Labs    10/27/19 1141  VITAMINB12 201   Urine analysis:    Component Value Date/Time   COLORURINE AMBER (A) 10/19/2019 1937   APPEARANCEUR HAZY (A) 10/19/2019 1937   LABSPEC 1.026 10/19/2019 1937   PHURINE 5.0 10/19/2019 1937   GLUCOSEU NEGATIVE 10/19/2019 1937   HGBUR NEGATIVE 10/19/2019 1937   BILIRUBINUR  NEGATIVE 10/19/2019 1937   KETONESUR NEGATIVE 10/19/2019 1937   PROTEINUR NEGATIVE 10/19/2019 1937   NITRITE NEGATIVE 10/19/2019 1937   LEUKOCYTESUR NEGATIVE 10/19/2019 1937    ) Recent Results (from the past 240 hour(s))  Urine culture     Status: None   Collection Time: 10/19/19  7:37 PM   Specimen: Urine, Random  Result Value Ref Range Status   Specimen Description URINE, RANDOM  Final   Special Requests NONE  Final   Culture   Final    NO GROWTH Performed at [facility], [address] [place], [address] [place]    Report Status 10/21/2019 FINAL  Final  Gastrointestinal Panel by PCR , Stool     Status: None   Collection Time: 10/19/19  7:37 PM   Specimen: Urine, Catheterized; Stool  Result Value Ref Range Status   Campylobacter  species NOT DETECTED NOT DETECTED Final   Plesimonas shigelloides NOT DETECTED NOT DETECTED Final   Salmonella species NOT DETECTED NOT DETECTED Final   Yersinia enterocolitica NOT DETECTED NOT DETECTED Final   Vibrio species NOT DETECTED NOT DETECTED Final   Vibrio cholerae NOT DETECTED NOT DETECTED Final   Enteroaggregative E coli (EAEC) NOT DETECTED NOT DETECTED Final   Enteropathogenic E coli (EPEC) NOT DETECTED NOT DETECTED Final   Enterotoxigenic E coli (ETEC) NOT DETECTED NOT DETECTED Final   Shiga like toxin producing E coli (STEC) NOT DETECTED NOT DETECTED Final   Shigella/Enteroinvasive E coli (EIEC) NOT DETECTED NOT DETECTED Final   Cryptosporidium NOT DETECTED NOT DETECTED Final   Cyclospora cayetanensis NOT DETECTED NOT DETECTED Final   Entamoeba histolytica NOT DETECTED NOT DETECTED Final   Giardia lamblia NOT DETECTED NOT DETECTED Final   Adenovirus F40/41 NOT DETECTED NOT DETECTED Final   Astrovirus NOT DETECTED NOT DETECTED Final   Norovirus GI/GII NOT DETECTED NOT DETECTED Final   Rotavirus A NOT DETECTED NOT DETECTED Final   Sapovirus (I, II, IV, and V) NOT DETECTED NOT DETECTED Final    Comment: Performed at [facility], [address] [place], [place] [address]  C Difficile Quick Screen w PCR reflex     Status: None   Collection Time: 10/19/19  7:37 PM   Specimen: Urine, Catheterized; Stool  Result Value Ref Range Status   C Diff antigen NEGATIVE NEGATIVE Final   C Diff toxin NEGATIVE NEGATIVE Final   C Diff interpretation No C. difficile detected.  Final    Comment: Performed at [facility], [address], [place], [place] [address]  Culture, blood (routine x 2)     Status: None   Collection Time: 10/19/19  8:51 PM   Specimen: BLOOD  Result Value Ref Range Status   Specimen Description BLOOD LEFT ANTECUBITAL  Final   Special Requests   Final    BOTTLES DRAWN AEROBIC AND ANAEROBIC Blood Culture adequate volume   Culture   Final    NO GROWTH 5 DAYS Performed at [facility], [address] [place], [place] [address]    Report Status 10/24/2019 FINAL  Final  Culture, blood (routine x 2)     Status: None   Collection Time: 10/19/19  9:12 PM   Specimen: BLOOD RIGHT HAND  Result Value Ref Range Status   Specimen Description BLOOD RIGHT HAND  Final   Special Requests   Final    BOTTLES DRAWN AEROBIC AND ANAEROBIC Blood Culture results may not be optimal due to an inadequate volume of blood received in culture bottles   Culture   Final    NO GROWTH 5 DAYS Performed at [facility], [address], [place], [place] [address]    Report Status 10/24/2019 FINAL  Final  SARS Coronavirus 2 by RT PCR (hospital order, performed in [facility] hospital lab) Nasopharyngeal Nasopharyngeal Swab     Status: None   Collection Time: 10/20/19  1:55 AM   Specimen: Nasopharyngeal Swab  Result Value Ref Range Status   SARS Coronavirus 2 NEGATIVE NEGATIVE Final    Comment: (NOTE) SARS-CoV-2 target nucleic acids are NOT DETECTED.  The SARS-CoV-2 RNA is generally detectable in upper and lower respiratory specimens during the acute phase of infection. The lowest concentration of  SARS-CoV-2 viral copies this assay can detect is 250 copies / mL. A negative result does not preclude SARS-CoV-2 infection and should not be  used as the sole basis for treatment or other patient management decisions.  A negative result may occur with improper specimen collection / handling, submission of specimen other than nasopharyngeal swab, presence of viral mutation(s) within the areas targeted by this assay, and inadequate number of viral copies (<250 copies / mL). A negative result must be combined with clinical observations, patient history, and epidemiological information.  Fact Sheet for Patients:   BoilerBrush.com.cy  Fact Sheet for Healthcare Providers: https://pope.com/  This test is not yet approved or  cleared by the [place] FDA and has been authorized for detection and/or diagnosis of SARS-CoV-2 by FDA under an Emergency Use Authorization (EUA).  This EUA will remain in effect (meaning this test can be used) for the duration of the COVID-19 declaration under Section 564(b)(1) of the Act, 21 U.S.C. section 360bbb-3(b)(1), unless the authorization is terminated or revoked sooner.  Performed at [facility], [address], [place], [place] [address]          Radiology Studies: No results found.      Scheduled Meds: . atenolol  25 mg Oral BID  . cholecalciferol  2,000 Units Oral Daily  . enoxaparin (LOVENOX) injection  40 mg Subcutaneous Q24H  . Gerhardt's butt cream   Topical 6 X Daily  . l-methylfolate-B6-B12  1 tablet Oral Daily  . lactobacillus acidophilus  2 tablet Oral Daily  . levETIRAcetam  1,500 mg Oral BID  . lisinopril  5 mg Oral Daily  . multivitamin with minerals  1 tablet Oral Daily  . nystatin cream   Topical BID  . pantoprazole  40 mg Oral QHS   Continuous Infusions:    LOS: 8 days    Time spent:  Marlin Canary [facility] 10/28/2019, 1:27 PM

## 2019-10-28 NOTE — Anesthesia Postprocedure Evaluation (Signed)
Anesthesia Post Note  Patient: Carlos Ferguson  Procedure(s) Performed: COLONOSCOPY WITH PROPOFOL (N/A ) BIOPSY POLYPECTOMY     Patient location during evaluation: PACU Anesthesia Type: MAC Level of consciousness: awake and alert Pain management: pain level controlled Vital Signs Assessment: post-procedure vital signs reviewed and stable Respiratory status: spontaneous breathing, nonlabored ventilation, respiratory function stable and patient connected to nasal cannula oxygen Cardiovascular status: stable and blood pressure returned to baseline Postop Assessment: no apparent nausea or vomiting Anesthetic complications: no   No complications documented.  Last Vitals:  Vitals:   10/28/19 0907 10/28/19 0947  BP: 117/71 129/66  Pulse: 70 65  Resp: 12 20  Temp: 36.4 C 36.5 C  SpO2: 100% 99%    Last Pain:  Vitals:   10/28/19 1000  TempSrc:   PainSc: 0-No pain                 Brason Berthelot S

## 2019-10-28 NOTE — Plan of Care (Signed)
  Problem: Education: Goal: Knowledge of General Education information will improve Description: Including pain rating scale, medication(s)/side effects and non-pharmacologic comfort measures Outcome: Progressing   Problem: Elimination: Goal: Will not experience complications related to bowel motility Outcome: Progressing   

## 2019-10-28 NOTE — Op Note (Signed)
[facility] Patient Name: Carlos BickersGregory Seever Procedure Date : 10/28/2019 MRN: [id number] Attending MD: Tressia DanasKimberly Marua Qin MD, MD Date of Birth: Aug 09, 1965 CSN: [id number] Age: 54 Admit Type: Inpatient Procedure:                Colonoscopy Indications:              Clinically significant diarrhea of unexplained                            origin                           CT 10/19/19 showed submucosal fatty infiltration of                            the ascending and transverse colon with mild                            adjacent pericolonic edema Providers:                Tressia DanasKimberly Abdoul Encinas MD, MD, Vicki MalletBrandy Grace, RN,                            Kandice RobinsonsGuillaume Awaka, Technician Referring MD:              Medicines:                Monitored Anesthesia Care Complications:            No immediate complications. Estimated blood loss:                            Minimal. Estimated Blood Loss:     Estimated blood loss was minimal. Procedure:                Pre-Anesthesia Assessment:                           - Prior to the procedure, a History and Physical                            was performed, and patient medications and                            allergies were reviewed. The patient's tolerance of                            previous anesthesia was also reviewed. The risks                            and benefits of the procedure and the sedation                            options and risks were discussed with the patient.                            All questions were answered, and informed consent  was obtained. Prior Anticoagulants: The patient has                            taken Lovenox (enoxaparin), last dose was 2 days                            prior to procedure. ASA Grade Assessment: III - A                            patient with severe systemic disease. After                            reviewing the risks and benefits, the patient was                             deemed in satisfactory condition to undergo the                            procedure.                           After obtaining informed consent, the colonoscope                            was passed under direct vision. Throughout the                            procedure, the patient's blood pressure, pulse, and                            oxygen saturations were monitored continuously. The                            [id number]-HQ190L ([id number]) Olympus colonoscope was                            introduced through the anus and advanced to the the                            cecum, identified by appendiceal orifice and                            ileocecal valve. A second forward view of the right                            colon was performed. The colonoscopy was                            technically difficult and complex due to a                            redundant colon, significant looping and a tortuous  colon. Successful completion of the procedure was                            aided by applying abdominal pressure. The patient                            tolerated the procedure well. The quality of the                            bowel preparation was excellent. The ileocecal                            valve, appendiceal orifice, and rectum were                            photographed. Scope In: 8:24:26 AM Scope Out: 8:40:22 AM Scope Withdrawal Time: 0 hours 8 minutes 53 seconds  Total Procedure Duration: 0 hours 15 minutes 56 seconds  Findings:      The perianal and digital rectal examinations were normal except for       external hemorrhoids.      A 2 mm polyp was found in the transverse colon. The polyp was flat. The       polyp was removed with a cold snare. Resection and retrieval were       complete. Estimated blood loss was minimal.      The colon (entire examined portion) appeared normal. Biopsies for       histology were taken with a cold forceps from  the right colon and left       colon for evaluation of microscopic colitis. Estimated blood loss was       minimal.      A few small-mouthed diverticula were found in the sigmoid colon and       descending colon.      The exam was otherwise without abnormality on direct and retroflexion       views. Impression:               - One 2 mm polyp in the transverse colon, removed                            with a cold snare. Resected and retrieved.                           - The entire examined colon is normal. No                            endoscopic evidence for colitis. Biopsied.                           - External hemorrhoids. Recommendation:           - Return to hospital ward for ongoing care.                           - Resume previous diet. High fiber diet recommended.                           -  Continue present medications.                           - Consider trial of cholestyramine if diarrhea                            persists.                           - Await pathology results.                           - Repeat colonoscopy date to be determined after                            pending pathology results are reviewed for                            surveillance.                           - Emerging evidence supports eating a diet of                            fruits, vegetables, grains, calcium, and yogurt                            while reducing red meat and alcohol may reduce the                            risk of colon cancer.                           Results reviewed with the patient in the endoscopy                            recovery area and with Dr. Benjamine Mola. Procedure Code(s):        --- Professional ---                           [phone], Colonoscopy, flexible; with removal of                            tumor(s), polyp(s), or other lesion(s) by snare                            technique                           45380, 59, Colonoscopy, flexible; with biopsy,                             single or multiple Diagnosis Code(s):        --- Professional ---                           K63.5, Polyp of  colon                           R19.7, Diarrhea, unspecified CPT copyright 2019 American Medical Association. All rights reserved. The codes documented in this report are preliminary and upon coder review may  be revised to meet current compliance requirements. Tressia Danas MD, MD 10/28/2019 8:54:03 AM This report has been signed electronically. Number of Addenda: 0

## 2019-10-28 NOTE — Transfer of Care (Signed)
Immediate Anesthesia Transfer of Care Note  Patient: Carlos Ferguson  Procedure(s) Performed: COLONOSCOPY WITH PROPOFOL (N/A ) BIOPSY POLYPECTOMY  Patient Location: PACU  Anesthesia Type:MAC  Level of Consciousness: awake, alert  and oriented  Airway & Oxygen Therapy: Patient Spontanous Breathing  Post-op Assessment: Report given to RN and Post -op Vital signs reviewed and stable  Post vital signs: Reviewed and stable  Last Vitals:  Vitals Value Taken Time  BP 117/71 10/28/19 0907  Temp 36.3 C 10/28/19 0852  Pulse 64 10/28/19 0907  Resp 13 10/28/19 0907  SpO2 100 % 10/28/19 0907  Vitals shown include unvalidated device data.  Last Pain:  Vitals:   10/28/19 0907  TempSrc:   PainSc: 0-No pain      Patients Stated Pain Goal: 0 (14/43/15 4008)  Complications: No complications documented.

## 2019-10-29 ENCOUNTER — Inpatient Hospital Stay (HOSPITAL_COMMUNITY): Payer: Medicare Other

## 2019-10-29 NOTE — Progress Notes (Signed)
Physical Therapy Treatment Patient Details Name: Carlos Ferguson MRN: [id number] DOB: [birth year] Today's Date: 10/29/2019    History of Present Illness Pt is a 54 y/o male admitted secondary to diarrhea and has bilateral buttock wounds. Imaging revelaed colitis of ascending and transverse colon. PMH includes spina bifida, CVA, chiari malformation s/p shunt, CVA, seizure, and HTN.     PT Comments    Continuing work on functional mobility and activity tolerance;  Session focused on looking at his methods for ascending and descending stairs using L rail to approximate going into his home; Close guard for safety, numerous small losses of balance from which he recovers using crutches for support; lots of small adjustments of crutch placement to catch balance; demonstrated a technique to go up/down stairs with a shower seat to give him more options; Tammy Sours certainly has his way of doing things, and is hesitant to make the transition to wheelchair full time; While his current methods for mobility do put him at a fall risk, he has also managed this way for quite some time, and hasn't fallen   Follow Up Recommendations  Home health PT     Equipment Recommendations  None recommended by PT    Recommendations for Other Services       Precautions / Restrictions Precautions Precautions: Fall Required Braces or Orthoses: Other Brace Other Brace: R AFO, L KAFO    Mobility  Bed Mobility Overal bed mobility: Needs Assistance Bed Mobility: Supine to Sit     Supine to sit: Supervision;HOB elevated     General bed mobility comments: increased time, assist for safety scooting to EOB with height of bed elevated  Transfers Overall transfer level: Needs assistance Equipment used: Crutches Transfers: Sit to/from Stand Sit to Stand: Min guard Stand pivot transfers: From elevated surface;Min guard       General transfer comment: Dependent on momentum to get to stand; PT and tech put foot in front of  crutches to keep from slipping  Ambulation/Gait Ambulation/Gait assistance: Min guard;Supervision Gait Distance (Feet): 20 Feet Assistive device: Crutches Gait Pattern/deviations: Decreased stride length;Narrow base of support     General Gait Details: using trunk and heavy UE support on crutches with reciprocating gait pattern.  [place]ard for safety, but no overt LOB.   Stairs Stairs: Yes Stairs assistance: Min guard;+2 safety/equipment (very close guard) Stair Management: One rail Left;With crutches Number of Stairs: 3 (x2) General stair comments: Extremely close guard for safety; Pt demonstrated his way of going up/down steps; he keeps his crutches under both his axillae, and holds to R rail; advances both feet up the step ascending; steps down with L foot first, and then has to move his lower torso to advance R foot down   Wheelchair Mobility    Modified Rankin (Stroke Patients Only)       Balance     Sitting balance-Leahy Scale: Good       Standing balance-Leahy Scale: Poor Standing balance comment: Reliant on bUE support                             Cognition Arousal/Alertness: Awake/alert Behavior During Therapy: WFL for tasks assessed/performed Overall Cognitive Status: Within Functional Limits for tasks assessed                                        Exercises  General Comments General comments (skin integrity, edema, etc.): Donned a brief due to pt's concern over possible diarrhea      Pertinent Vitals/Pain Pain Assessment: 0-10 Pain Score: 2  Pain Location: bottom  Pain Descriptors / Indicators: Tender;Sore Pain Intervention(s): Monitored during session    Home Living                      Prior Function            PT Goals (current goals can now be found in the care plan section) Acute Rehab PT Goals Patient Stated Goal: be able to go home, resolve stomach issues PT Goal Formulation: With  patient Time For Goal Achievement: 11/09/19 Potential to Achieve Goals: Good Progress towards PT goals: Progressing toward goals    Frequency    Min 3X/week      PT Plan Current plan remains appropriate    Co-evaluation              AM-PAC PT "6 Clicks" Mobility   Outcome Measure  Help needed turning from your back to your side while in a flat bed without using bedrails?: None Help needed moving from lying on your back to sitting on the side of a flat bed without using bedrails?: None Help needed moving to and from a bed to a chair (including a wheelchair)?: A Little Help needed standing up from a chair using your arms (e.g., wheelchair or bedside chair)?: A Little Help needed to walk in hospital room?: A Little Help needed climbing 3-5 steps with a railing? : A Lot 6 Click Score: 19    End of Session Equipment Utilized During Treatment: Gait belt Activity Tolerance: Patient tolerated treatment well Patient left: in chair;with call bell/phone within reach;with chair alarm set Nurse Communication: Mobility status PT Visit Diagnosis: Muscle weakness (generalized) (M62.81);Other abnormalities of gait and mobility (R26.89)     Time: 1043-1130 PT Time Calculation (min) (ACUTE ONLY): 47 min  Charges:  $Gait Training: 23-37 mins $Therapeutic Activity: 8-22 mins                     Van Clines, PT  Acute Rehabilitation Services Pager [phone] Office [phone]    Levi Aland 10/29/2019, 6:18 PM

## 2019-10-29 NOTE — Plan of Care (Signed)
  Problem: Education: Goal: Knowledge of General Education information will improve Description: Including pain rating scale, medication(s)/side effects and non-pharmacologic comfort measures Outcome: Progressing   Problem: Elimination: Goal: Will not experience complications related to bowel motility Outcome: Progressing   

## 2019-10-29 NOTE — Progress Notes (Signed)
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 -approximately 5 weeks or so ago he was started on an antibiotic therapy for suspected catheter associated urinary tract infection.  Around this time, patient states that he began to develop diarrhea.  Patient explains that this diarrhea is watery with occasional small amounts of blood.  Diarrhea occurs up to 15 times daily. -In the weeks that followed, patient was placed on courses of both Bactrim and nitrofurantoin for suspected urinary tract infection.   -Presented to [facility] emergency room, work-up in the ED noted concern for ascending and transverse colitis, also noted to have low-grade temp of 100.5 and a white count 13.9, in addition noted to have full-thickness wounds on his bottom with tissue loss   Assessment & Plan:   Colitis Subacute diarrhea -CT did note colitis of ascending and transverse colon -C. difficile PCR screen negative x1, repeat ordered was canceled by the lab per Dr. Jomarie Longs -GI pathogen panel was negative  -Treated with IV ceftriaxone and Flagyl, day 5 today, changed to oral cephalosporin and Flagyl-- d/c abx -given a dose of imodium  -asked RN to monitor stool output/frequency -GI consult appreciated:-- s/p colonoscopy pm 7/3; biopsy pending  Multiple full-thickness buttock wounds with tissue loss -Present on admission -Worsened in the setting of ongoing diarrhea, MASD -Attempt to keep this clean and dry -Continue local care, appreciate wound care consult -pain  improved  Essential hypertension -Continue home regimen of atenolol  Seizure disorder Spina bifida -Continue Keppra  Neurogenic bladder -Continue self-catheterization per home regimen  Esophagitis -Started PPI  Numbness above upper lip -? nutritional deficit -no wounds/lesions so doubt zoster as crosses midline -check orthopantogram  Low b12 -replete   DVT prophylaxis: Lovenox Code Status: Full code Family Communication:  family at bedside Disposition Plan:  Status is: Inpatient  Remains inpatient appropriate because:Inpatient level of care appropriate due to severity of illness, Home pending improvement in diarrhea, patient is physically limited by his spina bifida and disabilities with sacral wounds and ongoing diarrhea   Dispo: The patient is from: Home              Anticipated d/c is to: Home              Anticipated d/c date is: 1-2              Patient currently is not medically stable to d/c.   Subjective: Bowels are still soft  Objective: Vitals:   10/28/19 1700 10/28/19 2019 10/29/19 0518 10/29/19 0855  BP: (!) 128/53 120/76 (!) 113/58 116/67  Pulse: 78 96 66 70  Resp: 20 18 18 18   Temp: 98.8 F (37.1 C) 99.1 F (37.3 C) 98.8 F (37.1 C) 97.6 F (36.4 C)  TempSrc: Oral Oral Oral Oral  SpO2: 99% 97% 98% 97%  Weight:      Height:        Intake/Output Summary (Last 24 hours) at 10/29/2019 1410 Last data filed at 10/29/2019 0900 Gross per 24 hour  Intake 1004 ml  Output 750 ml  Net 254 ml   Filed Weights   10/20/19 0542 10/24/19 2054 10/27/19  2105  Weight: 69.4 kg 68.7 kg 68.7 kg    Examination:   General: Appearance:     Overweight male in no acute distress     Lungs:     Clear to auscultation bilaterally, respirations unlabored  Heart:    Normal heart rate. Normal rhythm. No murmurs, rubs, or gallops.      Neurologic:   Awake, alert, oriented x 3. No apparent focal neurological           defect.     Data Reviewed:   CBC: Recent  Labs  Lab 10/23/19 0507 10/24/19 0509 10/25/19 0446 10/27/19 1141  WBC 6.0 7.5 6.6 7.2  HGB 13.0 11.9* 12.2* 12.1*  HCT 39.3 37.7* 38.6* 38.6*  MCV 93.6 95.2 94.4 94.4  PLT 383 301 275 275   Basic Metabolic Panel: Recent Labs  Lab 10/23/19 0507 10/24/19 0509 10/25/19 0446 10/27/19 1141  NA 142 143 142 141  K 4.1 4.7 4.0 4.3  CL 111 112* 109 107  CO2 21* 22 24 25   GLUCOSE 114* 108* 103* 99  BUN 9 11 11 13   CREATININE 0.85 0.79 0.62 0.67  CALCIUM 8.7* 8.7* 8.7* 8.9   GFR: Estimated Creatinine Clearance: 87.8 mL/min (by C-G formula based on SCr of 0.67 mg/dL). Liver Function Tests: No results for input(s): AST, ALT, ALKPHOS, BILITOT, PROT, ALBUMIN in the last 168 hours. No results for input(s): LIPASE, AMYLASE in the last 168 hours. No results for input(s): AMMONIA in the last 168 hours. Coagulation Profile: No results for input(s): INR, PROTIME in the last 168 hours. Cardiac Enzymes: No results for input(s): CKTOTAL, CKMB, CKMBINDEX, TROPONINI in the last 168 hours. BNP (last 3 results) No results for input(s): PROBNP in the last 8760 hours. HbA1C: No results for input(s): HGBA1C in the last 72 hours. CBG: No results for input(s): GLUCAP in the last 168 hours. Lipid Profile: No results for input(s): CHOL, HDL, LDLCALC, TRIG, CHOLHDL, LDLDIRECT in the last 72 hours. Thyroid Function Tests: No results for input(s): TSH, T4TOTAL, FREET4, T3FREE, THYROIDAB in the last 72 hours. Anemia Panel: Recent Labs    10/27/19 1141  VITAMINB12 201   Urine analysis:    Component Value Date/Time   COLORURINE AMBER (A) 10/19/2019 1937   APPEARANCEUR HAZY (A) 10/19/2019 1937   LABSPEC 1.026 10/19/2019 1937   PHURINE 5.0 10/19/2019 1937   GLUCOSEU NEGATIVE 10/19/2019 1937   HGBUR NEGATIVE 10/19/2019 1937   BILIRUBINUR NEGATIVE 10/19/2019 1937   KETONESUR NEGATIVE 10/19/2019 1937   PROTEINUR NEGATIVE 10/19/2019 1937   NITRITE NEGATIVE 10/19/2019 1937   LEUKOCYTESUR NEGATIVE  10/19/2019 1937    ) Recent Results (from the past 240 hour(s))  Urine culture     Status: None   Collection Time: 10/19/19  7:37 PM   Specimen: Urine, Random  Result Value Ref Range Status   Specimen Description URINE, RANDOM  Final   Special Requests NONE  Final   Culture   Final    NO GROWTH Performed at [facility], [address] [place], [place] [address]    Report Status 10/21/2019 FINAL  Final  Gastrointestinal Panel by PCR , Stool     Status: None   Collection Time: 10/19/19  7:37 PM   Specimen: Urine, Catheterized; Stool  Result Value Ref Range Status   Campylobacter species NOT DETECTED NOT DETECTED Final   Plesimonas shigelloides NOT DETECTED NOT DETECTED Final   Salmonella species NOT DETECTED NOT DETECTED Final   Yersinia enterocolitica NOT DETECTED  NOT DETECTED Final   Vibrio species NOT DETECTED NOT DETECTED Final   Vibrio cholerae NOT DETECTED NOT DETECTED Final   Enteroaggregative E coli (EAEC) NOT DETECTED NOT DETECTED Final   Enteropathogenic E coli (EPEC) NOT DETECTED NOT DETECTED Final   Enterotoxigenic E coli (ETEC) NOT DETECTED NOT DETECTED Final   Shiga like toxin producing E coli (STEC) NOT DETECTED NOT DETECTED Final   Shigella/Enteroinvasive E coli (EIEC) NOT DETECTED NOT DETECTED Final   Cryptosporidium NOT DETECTED NOT DETECTED Final   Cyclospora cayetanensis NOT DETECTED NOT DETECTED Final   Entamoeba histolytica NOT DETECTED NOT DETECTED Final   Giardia lamblia NOT DETECTED NOT DETECTED Final   Adenovirus F40/41 NOT DETECTED NOT DETECTED Final   Astrovirus NOT DETECTED NOT DETECTED Final   Norovirus GI/GII NOT DETECTED NOT DETECTED Final   Rotavirus A NOT DETECTED NOT DETECTED Final   Sapovirus (I, II, IV, and V) NOT DETECTED NOT DETECTED Final    Comment: Performed at [facility], [address] [place], [place] [address]  C Difficile Quick Screen w PCR reflex     Status: None   Collection Time: 10/19/19  7:37 PM    Specimen: Urine, Catheterized; Stool  Result Value Ref Range Status   C Diff antigen NEGATIVE NEGATIVE Final   C Diff toxin NEGATIVE NEGATIVE Final   C Diff interpretation No C. difficile detected.  Final    Comment: Performed at [facility], [address] [place], [place] [address]  Culture, blood (routine x 2)     Status: None   Collection Time: 10/19/19  8:51 PM   Specimen: BLOOD  Result Value Ref Range Status   Specimen Description BLOOD LEFT ANTECUBITAL  Final   Special Requests   Final    BOTTLES DRAWN AEROBIC AND ANAEROBIC Blood Culture adequate volume   Culture   Final    NO GROWTH 5 DAYS Performed at [facility], [address] [place], [place] [address]    Report Status 10/24/2019 FINAL  Final  Culture, blood (routine x 2)     Status: None   Collection Time: 10/19/19  9:12 PM   Specimen: BLOOD RIGHT HAND  Result Value Ref Range Status   Specimen Description BLOOD RIGHT HAND  Final   Special Requests   Final    BOTTLES DRAWN AEROBIC AND ANAEROBIC Blood Culture results may not be optimal due to an inadequate volume of blood received in culture bottles   Culture   Final    NO GROWTH 5 DAYS Performed at [facility], [address] [place], [place] [address]    Report Status 10/24/2019 FINAL  Final  SARS Coronavirus 2 by RT PCR (hospital order, performed in [facility] hospital lab) Nasopharyngeal Nasopharyngeal Swab     Status: None   Collection Time: 10/20/19  1:55 AM   Specimen: Nasopharyngeal Swab  Result Value Ref Range Status   SARS Coronavirus 2 NEGATIVE NEGATIVE Final    Comment: (NOTE) SARS-CoV-2 target nucleic acids are NOT DETECTED.  The SARS-CoV-2 RNA is generally detectable in upper and lower respiratory specimens during the acute phase of infection. The lowest concentration of SARS-CoV-2 viral copies this assay can detect is 250 copies / mL. A negative result does not preclude SARS-CoV-2 infection and should not be used as the sole  basis for treatment or other patient management decisions.  A negative result may occur with improper specimen collection / handling, submission of specimen other than nasopharyngeal  swab, presence of viral mutation(s) within the areas targeted by this assay, and inadequate number of viral copies (<250 copies / mL). A negative result must be combined with clinical observations, patient history, and epidemiological information.  Fact Sheet for Patients:   BoilerBrush.com.cy  Fact Sheet for Healthcare Providers: https://pope.com/  This test is not yet approved or  cleared by the [place] FDA and has been authorized for detection and/or diagnosis of SARS-CoV-2 by FDA under an Emergency Use Authorization (EUA).  This EUA will remain in effect (meaning this test can be used) for the duration of the COVID-19 declaration under Section 564(b)(1) of the Act, 21 U.S.C. section 360bbb-3(b)(1), unless the authorization is terminated or revoked sooner.  Performed at [facility], [address], [place], [place] [address]        Radiology Studies: No results found.    Scheduled Meds: . atenolol  25 mg Oral BID  . cholecalciferol  2,000 Units Oral Daily  . enoxaparin (LOVENOX) injection  40 mg Subcutaneous Q24H  . Gerhardt's butt cream   Topical 6 X Daily  . l-methylfolate-B6-B12  1 tablet Oral Daily  . lactobacillus acidophilus  2 tablet Oral Daily  . levETIRAcetam  1,500 mg Oral BID  . lisinopril  5 mg Oral Daily  . multivitamin with minerals  1 tablet Oral Daily  . nystatin cream   Topical BID  . pantoprazole  40 mg Oral QHS   Continuous Infusions:    LOS: 9 days    Time spent:  Marlin Canary [facility] 10/29/2019, 2:10 PM

## 2019-10-29 NOTE — Progress Notes (Signed)
     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  --Resolving. No BMs today.  --Colonoscopy yesterday was normal. Biopsies pending.  --Stool studies negative but still could have been viral etiology.  --GI will sign off. Will be awaiting colon biopsies.      10/28/19 colonoscopy -One 2 mm polyp in the transverse colon, removed with a cold snare. Resected and retrieved. - The entire examined colon is normal. No endoscopic evidence for colitis. Biopsied. - External hemorrhoids.   SUBJECTIVE   No BMs today. Just back from xray ( facial numbness). Tolerating diet.    OBJECTIVE:     Vital signs in last 24 hours: Temp:  [97.6 F (36.4 C)-99.1 F (37.3 C)] 97.6 F (36.4 C) (07/04 0855) Pulse Rate:  [66-96] 70 (07/04 0855) Resp:  [18-20] 18 (07/04 0855) BP: (113-128)/(53-76) 116/67 (07/04 0855) SpO2:  [97 %-99 %] 97 % (07/04 0855) Last BM Date: 10/28/19 General:   Alert, in NAD Heart:  Regular rate and rhythm.  Pulm: Normal respiratory effort   Abdomen:  Soft,  nontender, nondistended.  Normal bowel sounds.          Neurologic:  Alert and  oriented. Psych:  Pleasant, cooperative.  Normal mood and affect.   Intake/Output from previous day: 07/03 0701 - 07/04 0700 In: 1507 [P.O.:1357; I.V.:150] Out: 300 [Urine:300] Intake/Output this shift: Total I/O In: 360 [P.O.:360] Out: 450 [Urine:450]  Lab Results: Recent Labs    10/27/19 1141  WBC 7.2  HGB 12.1*  HCT 38.6*  PLT 275   BMET Recent Labs    10/27/19 1141  NA 141  K 4.3  CL 107  CO2 25  GLUCOSE 99  BUN 13  CREATININE 0.67  CALCIUM 8.9      Principal Problem:   Cellulitis of multiple sites of  buttock Active Problems:   Essential hypertension   Seizure disorder (HCC)   Neurogenic bladder, flaccid   Acute diarrhea   Buttock wound, unspecified laterality, initial encounter   SIRS (systemic inflammatory response syndrome) (HCC)   Open wound of left knee   Esophagitis   Colitis, acute   Polyp of transverse colon     LOS: 9 days   Willette Cluster ,NP 10/29/2019, 11:17 AM

## 2019-10-30 ENCOUNTER — Encounter (HOSPITAL_COMMUNITY): Payer: Self-pay | Admitting: Gastroenterology

## 2019-10-30 ENCOUNTER — Inpatient Hospital Stay (HOSPITAL_COMMUNITY): Payer: Medicare Other

## 2019-10-30 MED ORDER — ATENOLOL 25 MG PO TABS
12.5000 mg | ORAL_TABLET | Freq: Two times a day (BID) | ORAL | Status: DC
  Administered 2019-10-30 – 2019-11-08 (×18): 12.5 mg via ORAL
  Filled 2019-10-30 (×19): qty 0.5

## 2019-10-30 MED ORDER — GADOBUTROL 1 MMOL/ML IV SOLN
6.0000 mL | Freq: Once | INTRAVENOUS | Status: AC | PRN
  Administered 2019-10-30: 6 mL via INTRAVENOUS

## 2019-10-30 NOTE — Progress Notes (Signed)
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 -approximately 5 weeks or so ago he was started on an antibiotic therapy for suspected catheter associated urinary tract infection.  Around this time, patient states that he began to develop diarrhea.  Patient explains that this diarrhea is watery with occasional small amounts of blood.  Diarrhea occurs up to 15 times daily. -In the weeks that followed, patient was placed on courses of both Bactrim and nitrofurantoin for suspected urinary tract infection.   -Presented to [facility] emergency room, work-up in the ED noted concern for ascending and transverse colitis, also noted to have low-grade temp of 100.5 and a white count 13.9, in addition noted to have full-thickness wounds on his bottom with tissue loss -diarrhea slow to improved: colonoscopy on 7/3- biopsy pending -developed numbness on right side of face  Assessment & Plan:   Colitis Subacute diarrhea -CT did note colitis of ascending and transverse colon -C. difficile PCR screen negative x1, repeat ordered was canceled by the lab per Dr. Jomarie Longs -GI pathogen panel was negative  -Treated with IV ceftriaxone and Flagyl, day 5 today, changed to oral cephalosporin and Flagyl-- d/c abx -given a dose of imodium  -asked RN to monitor stool output/frequency -GI consult appreciated:-- s/p colonoscopy 7/3; biopsy pending  Multiple full-thickness buttock wounds with tissue loss -Present on admission -Worsened in the setting of ongoing diarrhea, MASD -Attempt  to keep this clean and dry -Continue local care, appreciate wound care consult -pain improved  Essential hypertension -Continue home regimen of atenolol but lower does -avoid hypotension  Seizure disorder Spina bifida -Continue Keppra  Neurogenic bladder -Continue self-catheterization per home regimen  Esophagitis -Started PPI  Numbness above upper lip -? nutritional deficit -no wounds/lesions so doubt zoster as crosses midline -orthopantogram- no abscess -check MRI  Low b12 -repleted  PT recommends CIR consult for possible d/c there  DVT prophylaxis: Lovenox Code Status: Full code Family Communication:  family at bedside Disposition Plan:  Status is: Inpatient  Remains inpatient appropriate because:Inpatient level of care appropriate due to severity of illness, Home pending improvement in diarrhea, patient is physically limited by his spina bifida and disabilities with sacral wounds and ongoing diarrhea   Dispo: The patient is from: Home              Anticipated d/c is to:CIR?              Anticipated d/c date is: 1-2              Patient currently is not medically stable to d/c.   Subjective: Agreeable to rehab at [facility], says the area of numbness has expanded closer to ear on right  Objective: Vitals:   10/29/19 2123 10/29/19 2134 10/30/19 0437 10/30/19 0920  BP: (!) 103/58  (!) 118/58 107/63  Pulse: 85  73 71  Resp: 15  17 18   Temp: 98.9 F (37.2 C)  98.7 F (37.1 C) 98.6 F (37 C)  TempSrc: Oral  Oral Oral  SpO2: 97%  98% 97%  Weight:  69.2 kg    Height:        Intake/Output  Summary (Last 24 hours) at 10/30/2019 1245 Last data filed at 10/30/2019 0957 Gross per 24 hour  Intake 900 ml  Output 700 ml  Net 200 ml   Filed Weights   10/24/19 2054 10/27/19 2105 10/29/19 2134  Weight: 68.7 kg 68.7 kg 69.2 kg    Examination:   General: Appearance:     Overweight male in no acute distress     Lungs:     Clear to auscultation bilaterally,  respirations unlabored  Heart:    Normal heart rate. Normal rhythm. No murmurs, rubs, or gallops.    No rashes or lesions on face  Neurologic:   Awake, alert, oriented x 3.     Data Reviewed:   CBC: Recent Labs  Lab 10/24/19 0509 10/25/19 0446 10/27/19 1141  WBC 7.5 6.6 7.2  HGB 11.9* 12.2* 12.1*  HCT 37.7* 38.6* 38.6*  MCV 95.2 94.4 94.4  PLT 301 275 275   Basic Metabolic Panel: Recent Labs  Lab 10/24/19 0509 10/25/19 0446 10/27/19 1141  NA 143 142 141  K 4.7 4.0 4.3  CL 112* 109 107  CO2 22 24 25   GLUCOSE 108* 103* 99  BUN 11 11 13   CREATININE 0.79 0.62 0.67  CALCIUM 8.7* 8.7* 8.9   GFR: Estimated Creatinine Clearance: 88.2 mL/min (by C-G formula based on SCr of 0.67 mg/dL). Liver Function Tests: No results for input(s): AST, ALT, ALKPHOS, BILITOT, PROT, ALBUMIN in the last 168 hours. No results for input(s): LIPASE, AMYLASE in the last 168 hours. No results for input(s): AMMONIA in the last 168 hours. Coagulation Profile: No results for input(s): INR, PROTIME in the last 168 hours. Cardiac Enzymes: No results for input(s): CKTOTAL, CKMB, CKMBINDEX, TROPONINI in the last 168 hours. BNP (last 3 results) No results for input(s): PROBNP in the last 8760 hours. HbA1C: No results for input(s): HGBA1C in the last 72 hours. CBG: No results for input(s): GLUCAP in the last 168 hours. Lipid Profile: No results for input(s): CHOL, HDL, LDLCALC, TRIG, CHOLHDL, LDLDIRECT in the last 72 hours. Thyroid Function Tests: No results for input(s): TSH, T4TOTAL, FREET4, T3FREE, THYROIDAB in the last 72 hours. Anemia Panel: No results for input(s): VITAMINB12, FOLATE, FERRITIN, TIBC, IRON, RETICCTPCT in the last 72 hours. Urine analysis:    Component Value Date/Time   COLORURINE AMBER (A) 10/19/2019 1937   APPEARANCEUR HAZY (A) 10/19/2019 1937   LABSPEC 1.026 10/19/2019 1937   PHURINE 5.0 10/19/2019 1937   GLUCOSEU NEGATIVE 10/19/2019 1937   HGBUR NEGATIVE 10/19/2019  1937   BILIRUBINUR NEGATIVE 10/19/2019 1937   KETONESUR NEGATIVE 10/19/2019 1937   PROTEINUR NEGATIVE 10/19/2019 1937   NITRITE NEGATIVE 10/19/2019 1937   LEUKOCYTESUR NEGATIVE 10/19/2019 1937    ) No results found for this or any previous visit (from the past 240 hour(s)).     Radiology Studies: DG Orthopantogram  Result Date: 10/29/2019 CLINICAL DATA:  54 year old male with upper lip numbness EXAM: ORTHOPANTOGRAM/PANORAMIC COMPARISON:  None. FINDINGS: No acute displaced fracture. Multiple dental amalgams. No periapical lucencies identified. IMPRESSION: Negative for acute displaced fracture Multiple dental amalgams. Electronically Signed   By: 12/30/2019 D.O.   On: 10/29/2019 14:34      Scheduled Meds: . atenolol  25 mg Oral BID  . cholecalciferol  2,000 Units Oral Daily  . enoxaparin (LOVENOX) injection  40 mg Subcutaneous Q24H  . Gerhardt's butt cream   Topical 6 X Daily  . l-methylfolate-B6-B12  1 tablet Oral Daily  . lactobacillus acidophilus  2 tablet  Oral Daily  . levETIRAcetam  1,500 mg Oral BID  . lisinopril  5 mg Oral Daily  . multivitamin with minerals  1 tablet Oral Daily  . nystatin cream   Topical BID  . pantoprazole  40 mg Oral QHS   Continuous Infusions:    LOS: 10 days    Time spent:  Marlin Canary [facility] 10/30/2019, 12:45 PM

## 2019-10-30 NOTE — Progress Notes (Signed)
Occupational Therapy Treatment Patient Details Name: Carlos Ferguson MRN: [id number] DOB: 23-Aug-1965 Today's Date: 10/30/2019    History of present illness Pt is a 54 y/o male admitted secondary to diarrhea and has bilateral buttock wounds. Imaging revelaed colitis of ascending and transverse colon. PMH includes spina bifida, CVA, chiari malformation s/p shunt, CVA, seizure, and HTN.    OT comments  Pt continues to report weakness impacting independence with ADLs and mobility, but remains motivated to work with acute level therapies. Guided pt in further mobility with ADLs, demonstrating oral care standing at sink at Supervision level. When washing hair with shampoo cap at sink, pt unable to stand unsupported for this task, requiring OT assistance to complete task while maintaining stability in standing. Assessed mobility to bathroom and transfer to regular toilet with pt requiring physical assist for safe landing to toilet and to power up from surface safely.   Today, pt reports having difficulty with stair training during PT's session this weekend causing concern for being able to go home without assistance. Pt now re-considering rehab prior to DC home though hesitant to be pushed to use wheelchair for main means of mobility. If pt and therapists formulate matching goals of importance for this pt, believe CIR would be beneficial. Recommendations updated accordingly. Will continue to assess progress and follow pt acutely.   Follow Up Recommendations  CIR    Equipment Recommendations  None recommended by OT (has all needed DME)    Recommendations for Other Services Rehab consult    Precautions / Restrictions Precautions Precautions: Fall Required Braces or Orthoses: Other Brace Other Brace: R AFO, L KAFO Restrictions Weight Bearing Restrictions: No       Mobility Bed Mobility Overal bed mobility: Needs Assistance Bed Mobility: Supine to Sit;Sit to Supine     Supine to sit:  Supervision;HOB elevated Sit to supine: Supervision;HOB elevated      Transfers Overall transfer level: Needs assistance Equipment used: Crutches Transfers: Sit to/from [employer] Sit to Stand: Supervision;Min assist Stand pivot transfers: Min guard       General transfer comment: Pt overall Supervision for sit to stand from very elevated bed. Pt required Min to Mod A for sit to stand from regular toilet with grab bars and crutches. Pt min guard for stand pivot, intermittent cues for safe sequencing    Balance Overall balance assessment: Needs assistance Sitting-balance support: Feet supported Sitting balance-Leahy Scale: Good     Standing balance support: Bilateral upper extremity supported;During functional activity Standing balance-Leahy Scale: Poor Standing balance comment: Reliant on bUE support                            ADL either performed or assessed with clinical judgement   ADL Overall ADL's : Needs assistance/impaired     Grooming: Supervision/safety;Oral care;Minimal assistance;Brushing hair;Standing Grooming Details (indicate cue type and reason): Pt Supervision for oral care standing at sink (leaning on sink). Pt then required MIn A to wash hair with shampoo cap due to inability to perform bimanual tasks without external support             Lower Body Dressing: Minimal assistance;Sit to/from stand;Bed level Lower Body Dressing Details (indicate cue type and reason): Min A to don B LE braces bed level, Pt does this seated on floor at home. Pt close to setup for this task but difficulty due to air matress surface Toilet Transfer: Minimal assistance;Ambulation;Regular Toilet;Grab bars (crutches) Statistician  Details (indicate cue type and reason): Pt Min A for ambulation to bathroom and stand pivot to regular toilet. Pt reports this surface low, tendency to fall back on to surface to sit. Pt Min/Mod A to safely stand from regular  toileting with grab bars and crutches use         Functional mobility during ADLs: Min guard;Cueing for sequencing (crutches) General ADL Comments: Pt with continued weakness hindering performance at PLOF. Pt requires cues for slow pacing for mobility in room with crutches to decrease fall risk.      Vision   Vision Assessment?: No apparent visual deficits   Perception     Praxis      Cognition Arousal/Alertness: Awake/alert Behavior During Therapy: WFL for tasks assessed/performed Overall Cognitive Status: Within Functional Limits for tasks assessed                                          Exercises     Shoulder Instructions       General Comments Pt now reporting interest in CIR again after difficulty with stair training during PT session over the weekend. Pt reports "Im weaker than I thought" and reports his father unable to provide physical assistance.     Pertinent Vitals/ Pain       Pain Assessment: No/denies pain  Home Living                                          Prior Functioning/Environment              Frequency  Min 2X/week        Progress Toward Goals  OT Goals(current goals can now be found in the care plan section)  Progress towards OT goals: Progressing toward goals  Acute Rehab OT Goals Patient Stated Goal: be able to go home, resolve stomach issues OT Goal Formulation: With patient Time For Goal Achievement: 11/10/19 Potential to Achieve Goals: Good ADL Goals Pt Will Perform Grooming: with modified independence;standing Pt Will Perform Lower Body Bathing: with modified independence;sit to/from stand Pt Will Transfer to Toilet: with supervision;ambulating;bedside commode Pt Will Perform Toileting - Clothing Manipulation and hygiene: with modified independence;sit to/from stand  Plan Discharge plan needs to be updated    Co-evaluation                 AM-PAC OT "6 Clicks" Daily Activity      Outcome Measure   Help from another person eating meals?: None Help from another person taking care of personal grooming?: A Little Help from another person toileting, which includes using toliet, bedpan, or urinal?: A Little Help from another person bathing (including washing, rinsing, drying)?: A Little Help from another person to put on and taking off regular upper body clothing?: A Little Help from another person to put on and taking off regular lower body clothing?: A Little 6 Click Score: 19    End of Session Equipment Utilized During Treatment: Other (comment) (crutches)  OT Visit Diagnosis: Unsteadiness on feet (R26.81);Muscle weakness (generalized) (M62.81);History of falling (Z91.81)   Activity Tolerance Patient tolerated treatment well   Patient Left in bed;with call bell/phone within reach   Nurse Communication Mobility status        Time: 7673-4193 OT Time Calculation (min): 45  min  Charges: OT General Charges $OT Visit: 1 Visit OT Treatments $Self Care/Home Management : 23-37 mins $Therapeutic Activity: 8-22 mins  [name], OTR/L   [name] 10/30/2019, 12:56 PM

## 2019-10-30 NOTE — Progress Notes (Signed)
Physical Therapy Note   Discussed Greg's case with Raynelle Fanning, OT, who informed me that Tammy Sours is more open to going to CIR at this time;   During yesterday's session, he indicated to me that he was not interested in CIR -- that they focus too much on wheelchair use for mobility, and that wasn't in line with his own goals;   Still, yesterday's PT session practicing the stairs showed him more of the functional weakness and increased fall risk this acute illness has brought upon him -- per Raynelle Fanning, OT, he is requesting to be considered for CIR again today;   Acute PT heartily recommends a CIR stay for post-acute rehab; More therapies to work on strengthening, mobility, ADLs, and to maximize independence and safety with mobility will benefit him; Also -- I'm hoping he will be more open to more options for mobility, like more use of the wheelchair (does he have a specialty wheelchair and cushion?), and consider a reciprocating gait HKAFO orthosis for more stability and gait assist if that is safe and more in line with Greg's goals;   Will continue to follow,   Van Clines, PT  Acute Rehabilitation Services Pager [phone] Office [phone]

## 2019-10-30 NOTE — Plan of Care (Signed)
  Problem: Education: Goal: Knowledge of General Education information will improve Description: Including pain rating scale, medication(s)/side effects and non-pharmacologic comfort measures Outcome: Progressing   Problem: Activity: Goal: Risk for activity intolerance will decrease Outcome: Progressing   Problem: Coping: Goal: Level of anxiety will decrease Outcome: Progressing   Problem: Elimination: Goal: Will not experience complications related to bowel motility Outcome: Progressing   

## 2019-10-30 NOTE — Plan of Care (Signed)
  Problem: Clinical Measurements: Goal: Will remain free from infection Outcome: Progressing   Problem: Activity: Goal: Risk for activity intolerance will decrease Outcome: Progressing   Problem: Safety: Goal: Ability to remain free from injury will improve Outcome: Progressing   

## 2019-10-31 ENCOUNTER — Inpatient Hospital Stay (HOSPITAL_COMMUNITY): Payer: Medicare Other

## 2019-10-31 ENCOUNTER — Encounter: Payer: Self-pay | Admitting: Gastroenterology

## 2019-10-31 LAB — HCG, SERUM, QUALITATIVE: Preg, Serum: NEGATIVE

## 2019-10-31 LAB — TSH: TSH: 3.906 u[IU]/mL (ref 0.350–4.500)

## 2019-10-31 LAB — SURGICAL PATHOLOGY

## 2019-10-31 MED ORDER — IOHEXOL 300 MG/ML  SOLN
75.0000 mL | Freq: Once | INTRAMUSCULAR | Status: AC | PRN
  Administered 2019-10-31: 75 mL via INTRAVENOUS

## 2019-10-31 MED ORDER — DEXAMETHASONE SODIUM PHOSPHATE 4 MG/ML IJ SOLN
4.0000 mg | Freq: Once | INTRAMUSCULAR | Status: DC
  Administered 2019-10-31: 4 mg via INTRAVENOUS
  Filled 2019-10-31: qty 1

## 2019-10-31 NOTE — Plan of Care (Signed)
  Problem: Clinical Measurements: Goal: Ability to maintain clinical measurements within normal limits will improve Outcome: Progressing   

## 2019-10-31 NOTE — Progress Notes (Signed)
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 -approximately 5 weeks or so ago he was started on an antibiotic therapy for suspected catheter associated urinary tract infection.  Around this time, patient states that he began to develop diarrhea.  Patient explains that this diarrhea is watery with occasional small amounts of blood.  Diarrhea occurs up to 15 times daily. -In the weeks that followed, patient was placed on courses of both Bactrim and nitrofurantoin for suspected urinary tract infection.   -Presented to [facility] emergency room, work-up in the ED noted concern for ascending and transverse colitis, also noted to have low-grade temp of 100.5 and a white count 13.9, in addition noted to have full-thickness wounds on his bottom with tissue loss -diarrhea slow to improved: colonoscopy on 7/3- biopsy pending -developed numbness on right side of face  Assessment & Plan:   Colitis Subacute diarrhea -CT did note colitis of ascending and transverse colon -C. difficile PCR screen negative x1, repeat ordered was canceled by the lab per Dr. Jomarie Longs -GI pathogen panel was negative  -Treated with IV ceftriaxone and Flagyl, day 5 today, changed to oral cephalosporin and Flagyl-- d/c abx -given a dose of imodium  -GI consult appreciated:-- s/p colonoscopy 7/3; biopsy pending  2 new brain lesions- partially hemorrhagic- would explain numbness of face -unclear source -mets vs infection -TSH normal, Bhcg not detected -multiple skin lesion but per patient,  none that bleed or have changed to his knowlegde -will order CT scan of chest -already had CT scan of abdomen -NS consult appreciated by Dr. Dutch Quint  Multiple full-thickness buttock wounds with tissue loss -Present on admission -Worsened in the setting of ongoing diarrhea, MASD -Attempt to keep this clean and dry -Continue local care, appreciate wound care consult -pain improved  Essential hypertension -Continue home regimen of atenolol but lower does -avoid hypotension  Seizure disorder Spina bifida -Continue Keppra  Neurogenic bladder -Continue self-catheterization per home regimen  Esophagitis -Started PPI  Low b12 -repleted    DVT prophylaxis: Lovenox on hold Code Status: Full code Family Communication:  No family at bedside Disposition Plan:  Status is: Inpatient  Remains inpatient appropriate because:Inpatient level of care appropriate due to severity of illness, Home pending improvement in diarrhea, patient is physically limited by his spina bifida and disabilities with sacral wounds and ongoing diarrhea   Dispo: The patient is from: Home              Anticipated d/c is to:CIR?              Anticipated d/c date is: 3+              Patient currently is not medically stable to d/c.   Subjective: Still with numbess  Objective: Vitals:   10/30/19 1428 10/30/19 2100 10/31/19 0521 10/31/19 0841  BP: 113/68 128/76 (!) 147/81 125/71  Pulse:  62 72 81  Resp: 20 18 18 18   Temp: 98.9 F (37.2 C) 98.9 F (37.2 C) 98.5 F (36.9 C) 98.8 F (37.1 C)  TempSrc: Oral Oral Oral Oral  SpO2: 97% 94% 98% 99%  Weight:  69.2 kg  Height:        Intake/Output Summary (Last 24 hours) at 10/31/2019 1326 Last data filed at 10/31/2019 1252 Gross per 24 hour  Intake 480 ml  Output 850 ml  Net -370 ml   Filed Weights   10/27/19 2105 10/29/19 2134 10/30/19 2100  Weight: 68.7 kg 69.2 kg 69.2 kg    Examination:    General: Appearance:     Overweight male in no acute  distress     Lungs:     Clear to auscultation bilaterally, respirations unlabored  Heart:    Normal heart rate. Normal rhythm. No murmurs, rubs, or gallops.   skin Multiple moles on lower back, legs brown in color  Neurologic:   Awake, alert, oriented x 3. No apparent focal neurological           defect.    Data Reviewed:   CBC: Recent Labs  Lab 10/25/19 0446 10/27/19 1141  WBC 6.6 7.2  HGB 12.2* 12.1*  HCT 38.6* 38.6*  MCV 94.4 94.4  PLT 275 275   Basic Metabolic Panel: Recent Labs  Lab 10/25/19 0446 10/27/19 1141  NA 142 141  K 4.0 4.3  CL 109 107  CO2 24 25  GLUCOSE 103* 99  BUN 11 13  CREATININE 0.62 0.67  CALCIUM 8.7* 8.9   GFR: Estimated Creatinine Clearance: 88.2 mL/min (by C-G formula based on SCr of 0.67 mg/dL). Liver Function Tests: No results for input(s): AST, ALT, ALKPHOS, BILITOT, PROT, ALBUMIN in the last 168 hours. No results for input(s): LIPASE, AMYLASE in the last 168 hours. No results for input(s): AMMONIA in the last 168 hours. Coagulation Profile: No results for input(s): INR, PROTIME in the last 168 hours. Cardiac Enzymes: No results for input(s): CKTOTAL, CKMB, CKMBINDEX, TROPONINI in the last 168 hours. BNP (last 3 results) No results for input(s): PROBNP in the last 8760 hours. HbA1C: No results for input(s): HGBA1C in the last 72 hours. CBG: No results for input(s): GLUCAP in the last 168 hours. Lipid Profile: No results for input(s): CHOL, HDL, LDLCALC, TRIG, CHOLHDL, LDLDIRECT in the last 72 hours. Thyroid Function Tests: Recent Labs    10/31/19 0820  TSH 3.906   Anemia Panel: No results for input(s): VITAMINB12, FOLATE, FERRITIN, TIBC, IRON, RETICCTPCT in the last 72 hours. Urine analysis:    Component Value Date/Time   COLORURINE AMBER (A) 10/19/2019 1937   APPEARANCEUR HAZY (A) 10/19/2019 1937   LABSPEC 1.026 10/19/2019 1937   PHURINE 5.0 10/19/2019 1937   GLUCOSEU NEGATIVE 10/19/2019 1937   HGBUR NEGATIVE 10/19/2019  1937   BILIRUBINUR NEGATIVE 10/19/2019 1937   KETONESUR NEGATIVE 10/19/2019 1937   PROTEINUR NEGATIVE 10/19/2019 1937   NITRITE NEGATIVE 10/19/2019 1937   LEUKOCYTESUR NEGATIVE 10/19/2019 1937    ) No results found for this or any previous visit (from the past 240 hour(s)).     Radiology Studies: DG Orthopantogram  Result Date: 10/29/2019 CLINICAL DATA:  54 year old male with upper lip numbness EXAM: ORTHOPANTOGRAM/PANORAMIC COMPARISON:  None. FINDINGS: No acute displaced fracture. Multiple dental amalgams. No periapical lucencies identified. IMPRESSION: Negative for acute displaced fracture Multiple dental amalgams. Electronically Signed   By: Gilmer Mor D.O.   On: 10/29/2019 14:34   MR BRAIN W WO CONTRAST  Result Date: 10/30/2019 CLINICAL DATA:  54 year old male with history of Chiari 2 malformation, spina bifida, and intracranial hemorrhage. Expanding right side face numbness and tingling. EXAM: MRI HEAD WITHOUT AND WITH CONTRAST TECHNIQUE: Multiplanar, multiecho pulse sequences of the brain and  surrounding structures were obtained without and with intravenous contrast. CONTRAST:  29mL GADAVIST GADOBUTROL 1 MMOL/ML IV SOLN COMPARISON:  Brain MRI 12/25/2018. 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 Since 12/25/2018 there are new enhancing, partially hemorrhagic masses of the right inferior cerebellar peduncle (series 12, image 16) 15 mm diameter, and the left choroid plexus (12 mm diameter image 26). Associated new brainstem and cerebellar edema (series 7, image 9). Furthermore, T2* images today reveal blood products associated with those 2 lesions as well as a new hemorrhagic focus without definite enhancement in the left occipital lobe (series 8, image 10). And this is superimposed on stable multifocal chronic hemorrhage in both hemispheres, most notably the  anterior left frontal gyrus (series 8, image 19) and left cerebellum (which was acute on 12/25/2018). However, there has been expected evolution of the left cerebellar hemorrhage since August, with only residual encephalomalacia and hemosiderin. No other abnormal intracranial enhancement identified. No dural thickening. No restricted diffusion or evidence of acute infarction. No midline shift. Ventriculomegaly and right occipital approach ventricular shunt appear stable. No extra-axial blood. Stable pituitary. Vascular: Major intracranial vascular flow voids appear stable since August 2020. The major dural venous sinuses are enhancing and appear to be patent. Skull and upper cervical spine: Visible cervical spine and bone marrow signal appears stable. Sinuses/Orbits: Chronic Disconjugate gaze. Stable orbits and sinuses. Other: Mastoids remain clear. Internal auditory structures less well visualized today. IMPRESSION: 1. Two partially hemorrhagic and enhancing brain masses are new since August, one located at the junction of the right brainstem and cerebellar peduncle, and one arising from the left choroid plexus. And there is a new nonenhancing focus of hemorrhage in the left occipital lobe. Meanwhile, multifocal chronic cerebral hemorrhages elsewhere are stable since August, with expected evolution of the left cerebellar hemorrhage that occurred at that time. 2. New edema in the right brainstem and cerebellum associated with #1. Although underlying chronic intracranial mass effect and ventriculomegaly due to Chiari II malformation have not changed. 3. 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 Brain metastases from melanoma, renal cell carcinoma, thyroid carcinoma, and choriocarcinoma will commonly be hemorrhagic. Electronically Signed   By: Odessa Fleming  M.D.   On: 10/30/2019 14:35      Scheduled Meds: . atenolol  12.5 mg Oral BID  . cholecalciferol  2,000 Units Oral Daily  . dexamethasone (DECADRON) injection  4 mg Intravenous Once  . Gerhardt's butt cream   Topical 6 X Daily  . l-methylfolate-B6-B12  1 tablet Oral Daily  . lactobacillus acidophilus  2 tablet Oral Daily  . levETIRAcetam  1,500 mg Oral BID  . multivitamin with minerals  1 tablet Oral Daily  . nystatin cream   Topical BID  . pantoprazole  40 mg Oral QHS   Continuous Infusions:    LOS: 11 days    Time spent:  Marlin Canary [facility] 10/31/2019, 1:26 PM

## 2019-10-31 NOTE — Consult Note (Signed)
Reason for Consult: Brainstem lesion Referring Physician: Dr. Everlene Farrier Carlos Ferguson is an 54 y.o. 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  patient admitted after urinary tract infection with subsequent antibiotic related diarrhea.  Diarrhea leg is skin breakdown on the buttocks.  No suggestion of sepsis.  No fevers.  Patient incidentally noted that he has begun to have some difficulty with right facial numbness predominantly in his right V2 distribution.  He has no difficulty with speech, swallowing or diplopia.  Patient denies headache.  MRI scan work-up demonstrates evidence of a right medullary lesion and a secondary lesion in his choroid plexus in his left occipital horn.  Patient with evidence of a prior hemorrhage in his left cerebellum.  No known history of malignancy   Past Medical History:  Diagnosis Date  . Cellulitis of left knee 2014  . Flaccid neurogenic bladder    self caths every 4 hours  . Frequent UTI   . Hydrocephalus (HCC) 2014  . ICH (intracerebral hemorrhage) (HCC) 2014   left frontal ICH  . Seizures (HCC)   . Self-catheterizes urinary bladder   . Sepsis (HCC) 2014   due to knee infection   . Spina bifida (HCC)   . 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     Past Surgical History:  Procedure Laterality Date  . BIOPSY  10/28/2019   Procedure: BIOPSY;  Surgeon: Tressia Danas, MD;  Location: [facility] ENDOSCOPY;  Service: Gastroenterology;;  . COLONOSCOPY WITH PROPOFOL N/A 10/28/2019   Procedure: COLONOSCOPY WITH PROPOFOL;  Surgeon: Tressia Danas, MD;  Location: [facility] ENDOSCOPY;  Service: Gastroenterology;  Laterality: N/A;  . CSF SHUNT  1969  . LLE surgery     Multiple surgeries for correction.   Marland Kitchen POLYPECTOMY  10/28/2019   Procedure: POLYPECTOMY;  Surgeon: Tressia Danas, MD;  Location: [facility] ENDOSCOPY;  Service: Gastroenterology;;  . RLE surgery     surgery for  correction     Family History  Problem Relation Age of Onset  . Stroke Mother        light  . Stroke Father   . CAD Father   . Heart murmur Sister     Social History:  reports that he has never smoked. He has never used smokeless tobacco. He reports previous alcohol use. He reports that he does not use drugs.  Allergies:  Allergies  Allergen Reactions  . Latex Anaphylaxis and Hives  . Penicillins Hives and Itching    Medications: I have reviewed the patient's current medications.  Results for orders placed or performed during the hospital encounter of 10/19/19 (from the past 48 hour(s))  hCG, serum, qualitative     Status: None   Collection Time: 10/31/19  8:20 AM  Result Value Ref Range   Preg, Serum NEGATIVE NEGATIVE    Comment:        THE SENSITIVITY OF THIS METHODOLOGY IS >10 mIU/mL. Performed at [facility], [address] [place], [place] [address]   TSH     Status: None   Collection Time: 10/31/19  8:20 AM  Result Value Ref Range   TSH 3.906 0.350 - 4.500 uIU/mL    Comment: Performed by a 3rd Generation assay with a functional sensitivity of <=0.01 uIU/mL. Performed at [facility], [address] [place], [place] [address]     MR BRAIN W WO CONTRAST  Result Date: 10/30/2019 CLINICAL DATA:  54 year old male with history of Chiari 2 malformation, spina bifida, and intracranial hemorrhage. Expanding right side face numbness and tingling. EXAM: MRI HEAD WITHOUT AND WITH CONTRAST TECHNIQUE: Multiplanar, multiecho pulse sequences of the brain and surrounding structures were obtained without and with intravenous contrast. CONTRAST:  20mL GADAVIST GADOBUTROL 1 MMOL/ML IV SOLN COMPARISON:  Brain MRI 12/25/2018. 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 Since 12/25/2018 there are new enhancing, partially hemorrhagic masses of the right  inferior cerebellar peduncle (series 12, image 16) 15 mm diameter, and the left choroid plexus (12 mm diameter image 26). Associated new brainstem and cerebellar edema (series 7, image 9). Furthermore, T2* images today reveal blood products associated with those 2 lesions as well as a new hemorrhagic focus without definite enhancement in the left occipital lobe (series 8, image 10). And this is superimposed on stable multifocal chronic hemorrhage in both hemispheres, most notably the anterior left frontal gyrus (series 8, image 19) and left cerebellum (which was acute on 12/25/2018). However, there has been expected evolution of the left cerebellar hemorrhage since August, with only residual encephalomalacia and hemosiderin. No other abnormal intracranial enhancement identified. No dural thickening. No restricted diffusion or evidence of acute infarction. No midline shift. Ventriculomegaly and right occipital approach ventricular shunt appear stable. No extra-axial blood. Stable pituitary. Vascular: Major intracranial vascular flow voids appear stable since August 2020. The major dural venous sinuses are enhancing and appear to be patent. Skull and upper cervical spine: Visible cervical spine and bone marrow signal appears stable. Sinuses/Orbits: Chronic Disconjugate gaze. Stable orbits and sinuses. Other: Mastoids remain clear. Internal auditory structures less well visualized today. IMPRESSION: 1. Two partially hemorrhagic and enhancing brain masses are new since August, one located at the junction of the right brainstem and cerebellar peduncle, and one arising from the left choroid plexus. And there is a new nonenhancing focus of hemorrhage in the left occipital lobe. Meanwhile, multifocal chronic cerebral hemorrhages elsewhere are stable since August, with expected evolution of the left cerebellar hemorrhage that occurred at that time. 2. New edema in the right brainstem and cerebellum associated with #1.  Although underlying chronic intracranial mass effect and ventriculomegaly due to Chiari II malformation have not changed. 3. 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 Brain metastases from melanoma, renal cell carcinoma, thyroid carcinoma, and choriocarcinoma will commonly be hemorrhagic. Electronically Signed   By: Odessa Fleming M.D.   On: 10/30/2019 14:35    Pertinent items noted in HPI and remainder of comprehensive ROS otherwise negative. Blood pressure 125/71, pulse 81, temperature 98.8 F (37.1 C), temperature source Oral, resp. rate 18, height 5' (1.524 m), weight 69.2 kg, SpO2 99 %. Patient is awake and alert.  Oriented and appropriate.  Speech is fluent.  Judgment insight are intact.  Cranial nerve function with some intermittent nystagmus with right lateral gaze.  Extraocular movements are otherwise full.  Gaze is conjugate.  Facial movement normal bilaterally.  Facial sensation with some decrease sensation in the right V2 distribution tongue protrudes to midline.  Palate elevates midline.  Patient breathing easily.  Motor examination upper extremities normal.  Lower extremity plegia stable.  Examination head ears eyes nose throat demonstrates evidence of macro Cefaly.  Shunt appears intact.  Pulses refill equal in all.  Assessment/Plan: Unusual Neoplastic  lesions right medulla with some surrounding edema worrisome for metastatic disease.  Doubt infectious origin.  Lesion not in a surgically accessible location.  No indication for biopsy or resection patient should have CT scans of his neck chest and abdomen to evaluate for primary tumor or other possible metastatic disease.  Sherilyn Cooter A Carlos Ferguson 10/31/2019, 3:09 PM

## 2019-10-31 NOTE — Progress Notes (Signed)
Inpatient Rehabilitation Admissions Coordinator  Inpatient rehab consult received,. I met with patient at bedside for assessment. I am familiar with patient from CIR admit last year for 36 days and then he was discharged to SNF for continued rehab. Noted medical workup continues. I await further workup and progress before determining rehab venue options. Discussed with Dr. Eliseo Squires.  Danne Baxter, RN, MSN Rehab Admissions Coordinator [phone] 10/31/2019 10:38 AM

## 2019-10-31 NOTE — Progress Notes (Signed)
Physical Therapy Treatment Patient Details Name: Carlos Ferguson MRN: [id number] DOB: [birth year] Today's Date: 10/31/2019    History of Present Illness Pt is a 54 y/o male admitted secondary to diarrhea and has bilateral buttock wounds. Imaging revelaed colitis of ascending and transverse colon. PMH includes spina bifida, CVA, chiari malformation s/p shunt, CVA, seizure, and HTN.     PT Comments    Patient received in bed, family present at bedside. Patient requires assistance donning leg braces prior to ambulation. He is independent with bed mobility. Requires min assist with sit to stand. Ambulated 20 feet with B axillary crutches and min assist. He has a labored gait pattern and definitely needs assistance at this time due to fatigue and decreased safety with mobility. He then required assistance doffing B leg braces when back in bed. He will continue to benefit from skilled PT while here to improve strength and activity tolerance to be able to return home.       Follow Up Recommendations  CIR     Equipment Recommendations  None recommended by PT    Recommendations for Other Services       Precautions / Restrictions Precautions Precautions: Fall Required Braces or Orthoses: Other Brace Other Brace: R AFO, L KAFO Restrictions Weight Bearing Restrictions: No    Mobility  Bed Mobility Overal bed mobility: Independent Bed Mobility: Supine to Sit;Sit to Supine;Rolling Rolling: Independent   Supine to sit: Independent Sit to supine: Independent      Transfers Overall transfer level: Needs assistance Equipment used: Crutches Transfers: Sit to/from Stand Sit to Stand: Min assist         General transfer comment: Patient likes to slide off the end of the bed to stand with B crutches. Required min assist to scoot off safely. He requires right crutch to be blocked with PT's foot.  Ambulation/Gait Ambulation/Gait assistance: Min guard Gait Distance (Feet): 20  Feet Assistive device: Crutches Gait Pattern/deviations: Decreased stride length;Narrow base of support;Step-to pattern Gait velocity: decreased   General Gait Details: Patient does a bit of a hop with both feet at the same time forward. Short steps, increased weight bearing on B crutches. Fatigued with this distance.   Stairs             Wheelchair Mobility    Modified Rankin (Stroke Patients Only)       Balance Overall balance assessment: Needs assistance Sitting-balance support: Bilateral upper extremity supported Sitting balance-Leahy Scale: Good     Standing balance support: Bilateral upper extremity supported;During functional activity Standing balance-Leahy Scale: Poor Standing balance comment: Reliant on bUE support and external assist at this time for safety                            Cognition Arousal/Alertness: Awake/alert Behavior During Therapy: WFL for tasks assessed/performed Overall Cognitive Status: Within Functional Limits for tasks assessed                                        Exercises      General Comments        Pertinent Vitals/Pain Pain Assessment: Faces Faces Pain Scale: Hurts a little bit Pain Location: bottom  Pain Descriptors / Indicators: Sore    Home Living  Prior Function            PT Goals (current goals can now be found in the care plan section) Acute Rehab PT Goals Patient Stated Goal: to get stronger PT Goal Formulation: With patient Time For Goal Achievement: 11/09/19 Potential to Achieve Goals: Good Progress towards PT goals: Progressing toward goals    Frequency    Min 3X/week      PT Plan Current plan remains appropriate    Co-evaluation              AM-PAC PT "6 Clicks" Mobility   Outcome Measure  Help needed turning from your back to your side while in a flat bed without using bedrails?: None Help needed moving from lying on your  back to sitting on the side of a flat bed without using bedrails?: None Help needed moving to and from a bed to a chair (including a wheelchair)?: A Little Help needed standing up from a chair using your arms (e.g., wheelchair or bedside chair)?: A Little Help needed to walk in hospital room?: A Little Help needed climbing 3-5 steps with a railing? : A Lot 6 Click Score: 19    End of Session Equipment Utilized During Treatment: Gait belt Activity Tolerance: Patient tolerated treatment well;Patient limited by fatigue Patient left: in bed;with call bell/phone within reach;with family/visitor present Nurse Communication: Mobility status PT Visit Diagnosis: Muscle weakness (generalized) (M62.81);Other abnormalities of gait and mobility (R26.89)     Time: 1450-1515 PT Time Calculation (min) (ACUTE ONLY): 25 min  Charges:  $Gait Training: 23-37 mins                     Donique Hammonds, PT, GCS 10/31/19,3:26 PM

## 2019-11-01 NOTE — Progress Notes (Signed)
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 -approximately 5 weeks or so ago he was started on an antibiotic therapy for suspected catheter associated urinary tract infection.  Around this time, patient states that he began to develop diarrhea.  Patient explains that this diarrhea is watery with occasional small amounts of blood. -In the weeks that followed, patient was placed on courses of both Bactrim and nitrofurantoin for suspected urinary tract infection.   -Presented to [facility] emergency room, work-up in the ED noted concern for ascending and transverse colitis, also noted to have low-grade temp of 100.5 and a white count 13.9, in addition noted to have full-thickness wounds on his bottom with tissue loss -diarrhea slow to improved: colonoscopy on 7/3- biopsy pending -developed numbness on right side of face  Assessment & Plan:   Colitis Subacute diarrhea -CT did note colitis of ascending and transverse colon -C. difficile PCR screen negative x1, repeat ordered was canceled by lab  -GI pathogen panel was negative  -Treated with IV ceftriaxone and Flagyl for 5 days and then changed to oral cephalosporin and Flagyl--now off antibiotics -given a dose of imodium  -GI consult appreciated:-- s/p colonoscopy 7/3:  normal appearing mucosa; biopsy without inflammation, polyp noted high-grade tubular adenoma, needs GI follow-up  2 new brain lesions- partially hemorrhagic- -found as part of evaluation of new onset right-sided facial numbness that started  3 to 4 days ago -mets vs infection -TSH normal, Bhcg not detected -multiple skin lesion but per patient, none that bleed or have changed to his knowlegde -CT chest abdomen pelvis negative for primary malignancy -NS consult appreciated by Dr. Dutch Quint, recommended observation for now and repeat MRI brain on Monday  Multiple full-thickness buttock wounds with tissue loss -Present on admission -Worsened in the setting of ongoing diarrhea, MASD -Attempt to keep this clean and dry -Continue local care, appreciate wound care consult -pain improved  Essential hypertension -Continue home regimen of atenolol but lower does -avoid hypotension  Seizure disorder Spina bifida -Continue Keppra  Neurogenic bladder -Continue self-catheterization per home regimen  Esophagitis -Started PPI  Low b12 -repleted   DVT prophylaxis: Lovenox on hold Code Status: Full code Family Communication:  No family at bedside Disposition Plan:  Status is: Inpatient  Remains inpatient appropriate because:Inpatient level of care appropriate due to severity of illness, Home pending improvement in diarrhea, patient is physically limited by his spina bifida and disabilities with sacral wounds and ongoing diarrhea, and now plan for repeat MRI on Monday per neurosurgery   Dispo: The patient is from: Home              Anticipated d/c is to:CIR?              Anticipated d/c date is: 3+              Patient currently is not medically stable to d/c.   Subjective: -Feels better, diarrhea is improving, still with mild right-sided numbness  Objective: Vitals:   10/31/19 1724 10/31/19 2038 11/01/19 0445 11/01/19 0847  BP: 128/70 (!) 143/75 122/70 136/79  Pulse: 71 78 (!) 55 77  Resp: 18  18 18 18   Temp: 98.9 F (37.2 C) 98.7 F (37.1 C) 97.9 F (36.6 C) (!) 97.4 F (36.3 C)  TempSrc: Oral Oral Oral Oral  SpO2: 97% 94% 98% 98%  Weight:      Height:        Intake/Output Summary (Last 24 hours) at 11/01/2019  1502 Last data filed at 11/01/2019 1300 Gross per 24 hour  Intake 840 ml  Output 1200 ml  Net -360 ml   Filed Weights   10/27/19 2105 10/29/19 2134 10/30/19 2100  Weight: 68.7 kg 69.2 kg 69.2 kg    Examination:  Gen: Awake, Alert, Oriented X 3, no distress, short statured male HEENT: PERRLA, Neck supple, no JVD Lungs: Good air movement bilaterally, CTAB CVS: S1-S2, regular rate rhythm Abd: soft, Non tender, non distended, BS present Extremities: Short lower extremities with atrophy Skin: Full-thickness pressure wound on sacrum  Data Reviewed:   CBC: Recent Labs  Lab 10/27/19 1141  WBC 7.2  HGB 12.1*  HCT 38.6*  MCV 94.4  PLT 275   Basic Metabolic Panel: Recent Labs  Lab 10/27/19 1141  NA 141  K 4.3  CL 107  CO2 25  GLUCOSE 99  BUN 13  CREATININE 0.67  CALCIUM 8.9   GFR: Estimated Creatinine Clearance: 88.2 mL/min (by C-G formula based on SCr of 0.67 mg/dL). Liver Function Tests: No results for input(s): AST, ALT, ALKPHOS, BILITOT, PROT, ALBUMIN in the last 168 hours. No results for input(s): LIPASE, AMYLASE in the last 168 hours. No results for input(s): AMMONIA in the last 168 hours. Coagulation Profile: No results for input(s): INR, PROTIME in the last 168 hours. Cardiac Enzymes: No results for input(s): CKTOTAL, CKMB, CKMBINDEX, TROPONINI in the last 168 hours. BNP (last 3 results) No results for input(s): PROBNP in the last 8760 hours. HbA1C: No results for input(s): HGBA1C in the last 72 hours. CBG: No results for input(s): GLUCAP in the last 168 hours. Lipid Profile: No results for input(s): CHOL, HDL, LDLCALC, TRIG, CHOLHDL, LDLDIRECT in the last 72 hours. Thyroid Function Tests: Recent Labs    10/31/19 0820  TSH 3.906   Anemia Panel: No results for input(s): VITAMINB12, FOLATE, FERRITIN, TIBC, IRON, RETICCTPCT in the last 72 hours. Urine analysis:    Component Value Date/Time   COLORURINE AMBER (A) 10/19/2019 1937   APPEARANCEUR HAZY  (A) 10/19/2019 1937   LABSPEC 1.026 10/19/2019 1937   PHURINE 5.0 10/19/2019 1937   GLUCOSEU NEGATIVE 10/19/2019 1937   HGBUR NEGATIVE 10/19/2019 1937   BILIRUBINUR NEGATIVE 10/19/2019 1937   KETONESUR NEGATIVE 10/19/2019 1937   PROTEINUR NEGATIVE 10/19/2019 1937   NITRITE NEGATIVE 10/19/2019 1937   LEUKOCYTESUR NEGATIVE 10/19/2019 1937    ) No results found for this or any previous visit (from the past 240 hour(s)).     Radiology Studies: CT SOFT TISSUE NECK W CONTRAST  Result Date: 10/31/2019 CLINICAL DATA:  Brain mass or lesion. 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 Unusual mass lesions right medulla. Neck CT requested to assess for primary tumor or other metastatic disease. EXAM: CT NECK WITH CONTRAST TECHNIQUE: Multidetector CT imaging of the neck was performed using the standard protocol following the bolus administration of intravenous contrast. CONTRAST:  75mL OMNIPAQUE IOHEXOL 300 MG/ML  SOLN COMPARISON:  12/25/2018.  MRI 10/30/2019. FINDINGS: Pharynx and larynx: No mucosal or submucosal mass. Salivary glands: Parotid and submandibular glands are normal. Thyroid: Normal Lymph nodes: No enlarged or low-density nodes on either side of the neck.  Vascular: Carotid bifurcations appear widely patent. Jugular veins are patent. Limited intracranial: See results of brain MRI. Visualized orbits: Not included. Mastoids and visualized paranasal sinuses: Clear Skeleton: Ordinary cervical spondylosis. Question abnormal enhancement in the epidural space or along the surface of the cord in the C2-3 through C4 region. Consider cervical MRI. Upper chest: See results of chest CT. Other: None IMPRESSION: No evidence of primary soft tissue mass lesion in the neck region. Question if there is some abnormal enhancement along the surface of the cord in the region from C2-3 through C4. This would be better evaluated with MRI of the cervical spine with and without contrast.  This could relate to the chronic treated Chiari malformation. Electronically Signed   By: Paulina Fusi M.D.   On: 10/31/2019 17:25   CT CHEST WO CONTRAST  Result Date: 10/31/2019 CLINICAL DATA:  Intracranial metastatic disease, unknown primary. Chiari 2 malformation. EXAM: CT CHEST WITHOUT CONTRAST TECHNIQUE: Multidetector CT imaging of the chest was performed following the standard protocol without IV contrast. COMPARISON:  None. FINDINGS: Cardiovascular: There is mild left ventricular dilation noted with global cardiac size within normal limits. Mild coronary artery calcification. Trace pericardial fluid is likely physiologic. Thoracic vasculature is unremarkable on this noncontrast examination. Mediastinum/Nodes: A 17 mm indeterminate nodule is seen within the right thyroid lobe, axial image number sign 14/3. No pathologic thoracic adenopathy. Lungs/Pleura: A 7 mm subpleural noncalcified indeterminate pulmonary nodule is seen within the inferior lingula at axial image number sign 77/4. The lungs are otherwise clear. No pneumothorax or pleural effusion. Central airways are widely patent. Upper Abdomen: Mild splenomegaly is identified, stable since prior CT examination of 10/19/2019, with the spleen measuring 13.8 cm in greatest dimension. Gallstones are again identified within a contracted gallbladder. There is thickening of the distal esophagus circumferentially, a nonspecific finding that can be seen in the setting of esophagitis, such as reflux esophagitis. Musculoskeletal: Spina bifida of the a visualized thoracolumbar spine with diastasis of the a posterior elements of T11, T12 noted, in keeping with the patient's given history of Chiari malformation. No lytic or blastic bone lesions are seen. Shunt catheter tubing is seen within the a right anterior chest and appears discontiguous. IMPRESSION: 1. No definite primary source identified for the patient's intracranial metastatic disease. 1. Indeterminate  lingular nodule. 2. Indeterminate left thyroid nodule. Given the patient's hemorrhagic intracranial metastatic disease, dedicated thyroid sonography is recommended for further evaluation 3. Findings in keeping with the patient's known diagnosis of Chiari 2 malformation. Shunt catheter tubing within the right anterior chest appears discontiguous and may be nonfunctional. Electronically Signed   By: Helyn Numbers MD   On: 10/31/2019 17:20   US SCROTUM  Result Date: 10/31/2019 CLINICAL DATA:  Brain mass EXAM: ULTRASOUND OF SCROTUM TECHNIQUE: Complete ultrasound examination of the testicles, epididymis, and other scrotal structures was performed. COMPARISON:  None. FINDINGS: Right testicle Measurements: 4 x 2.7 x 2.8 cm. No mass or microlithiasis visualized. Left testicle Measurements: 3.6 x 2.1 x 3 cm. No mass or microlithiasis visualized. Right epididymis:  Normal in size and appearance. Left epididymis:  Normal in size and appearance. Hydrocele:  Tiny left hydrocele. Varicocele:  None visualized. IMPRESSION: Tiny left hydrocele. Negative for scrotal or testicular mass lesion. Electronically Signed   By: Jasmine Pang M.D.   On: 10/31/2019 21:24      Scheduled Meds: . atenolol  12.5 mg Oral BID  . cholecalciferol  2,000 Units Oral Daily  . Gerhardt's butt cream   Topical  6 X Daily  . l-methylfolate-B6-B12  1 tablet Oral Daily  . lactobacillus acidophilus  2 tablet Oral Daily  . levETIRAcetam  1,500 mg Oral BID  . multivitamin with minerals  1 tablet Oral Daily  . nystatin cream   Topical BID  . pantoprazole  40 mg Oral QHS   Continuous Infusions:    LOS: 12 days    Time spent:  Zannie Cove, MD [facility] 11/01/2019, 3:02 PM

## 2019-11-01 NOTE — Progress Notes (Signed)
Occupational Therapy Treatment Patient Details Name: Carlos Ferguson MRN: [id number] DOB: [birth year] Today's Date: 11/01/2019    History of present illness Pt is a 54 y/o male admitted secondary to diarrhea and has bilateral buttock wounds. Imaging revelaed colitis of ascending and transverse colon. PMH includes spina bifida, CVA, chiari malformation s/p shunt, CVA, seizure, and HTN.    OT comments  Patient supine in bed on arrival.  Required set up and min assist for LB dressing at bed level.  Patient was able to don both LE braces today without additional help, though min assist for donning shoes.  He is typically able to complete this independently and sits on floor to do so at home.  Stood with crutches and min guard off an elevated bed.   Walked to sink with close min guard and able to stand at sink to complete grooming.  Ended seated in chair with air cushion.  Fatigued after activity and would benefit from increasing activity tolerance and strength.  Will continue to follow with OT acutely to address the deficits listed below.    Follow Up Recommendations  CIR    Equipment Recommendations  None recommended by OT    Recommendations for Other Services      Precautions / Restrictions Precautions Precautions: Fall Required Braces or Orthoses: Other Brace Other Brace: R AFO, L KAFO Restrictions Weight Bearing Restrictions: No       Mobility Bed Mobility Overal bed mobility: Independent Bed Mobility: Supine to Sit     Supine to sit: Supervision        Transfers Overall transfer level: Needs assistance Equipment used: Crutches Transfers: Sit to/from Stand Sit to Stand: Min guard;From elevated surface         General transfer comment: Patient likes to slide off the end of the bed to stand with B crutches.     Balance Overall balance assessment: Needs assistance Sitting-balance support: Bilateral upper extremity supported Sitting balance-Leahy Scale: Good      Standing balance support: Bilateral upper extremity supported;During functional activity Standing balance-Leahy Scale: Poor Standing balance comment: Reliant on bUE support and external assist at this time for safety                           ADL either performed or assessed with clinical judgement   ADL Overall ADL's : Needs assistance/impaired     Grooming: Wash/dry hands;Wash/dry face;Oral care;Brushing hair;Min guard;Standing               Lower Body Dressing: Minimal assistance;Bed level Lower Body Dressing Details (indicate cue type and reason): Able to don braces without assist, required min assist with donning shoes             Functional mobility during ADLs: Min guard (crutches) General ADL Comments: Cues for safety     Vision       Perception     Praxis      Cognition Arousal/Alertness: Awake/alert Behavior During Therapy: WFL for tasks assessed/performed Overall Cognitive Status: Within Functional Limits for tasks assessed                                          Exercises     Shoulder Instructions       General Comments      Pertinent Vitals/ Pain       Pain  Assessment: Faces Faces Pain Scale: Hurts a little bit Pain Location: bottom  Pain Descriptors / Indicators: Sore Pain Intervention(s): Monitored during session;Limited activity within patient's tolerance;Repositioned  Home Living                                          Prior Functioning/Environment              Frequency  Min 2X/week        Progress Toward Goals  OT Goals(current goals can now be found in the care plan section)  Progress towards OT goals: Progressing toward goals  Acute Rehab OT Goals Patient Stated Goal: to get stronger OT Goal Formulation: With patient Time For Goal Achievement: 11/10/19 Potential to Achieve Goals: Good  Plan Discharge plan needs to be updated    Co-evaluation                  AM-PAC OT "6 Clicks" Daily Activity     Outcome Measure   Help from another person eating meals?: None Help from another person taking care of personal grooming?: A Little Help from another person toileting, which includes using toliet, bedpan, or urinal?: A Little Help from another person bathing (including washing, rinsing, drying)?: A Little Help from another person to put on and taking off regular upper body clothing?: A Little Help from another person to put on and taking off regular lower body clothing?: A Little 6 Click Score: 19    End of Session Equipment Utilized During Treatment: Other (comment) (crutches)  OT Visit Diagnosis: Unsteadiness on feet (R26.81);Muscle weakness (generalized) (M62.81);History of falling (Z91.81)   Activity Tolerance Patient tolerated treatment well   Patient Left in chair;with call bell/phone within reach;with chair alarm set   Nurse Communication Mobility status        Time: 8250-0370 OT Time Calculation (min): 23 min  Charges: OT General Charges $OT Visit: 1 Visit OT Treatments $Self Care/Home Management : 23-37 mins  Barbie Banner, OTR/L   Adella Hare 11/01/2019, 12:23 PM

## 2019-11-01 NOTE — Progress Notes (Signed)
CT scans of the neck chest and abdomen negative for obvious malignancy or other areas of metastatic disease.  Unusual inferior cerebellar peduncle/medullary lesion with enhancement.  Question possible abscess versus neoplasm.  Location of lesion not amenable to any type of surgical biopsy or aspiration.  At this point I am not sure there is anything to do other than observational management.  I would like patient get an MRI scan of his brain on Monday to evaluate for any progression.  Clinically is not behaving as if this were an abscess but certainly there is some concern of this.

## 2019-11-02 NOTE — Progress Notes (Signed)
PT Cancellation Note  Patient Details Name: Carlos Ferguson MRN: [id number] DOB: June 28, 1965   Cancelled Treatment:    Reason Eval/Treat Not Completed: Patient declined, no reason specified. Pt reporting a recent episode of diarrhea and had just gotten cleaned up and back in bed. Requesting therapist return after he rested for a bit. PT will continue to f/u with pt acutely as available.    Alessandra Bevels Gimena Buick 11/02/2019, 3:19 PM

## 2019-11-02 NOTE — Progress Notes (Signed)
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 emergency department complaints of buttock pain, abdominal pain and profuse diarrhea. -approximately 5 weeks or so ago he was started on an antibiotic therapy for suspected catheter associated urinary tract infection.  Around this time, patient states that he began to develop diarrhea.  Patient explains that this diarrhea is watery with occasional small amounts of blood. -In the weeks that followed, patient was placed on courses of both Bactrim and nitrofurantoin for suspected urinary tract infection.   -Presented to [facility] emergency room, work-up in the ED noted concern for ascending and transverse colitis, also noted to have low-grade temp of 100.5 and a white count 13.9, in addition noted to have full-thickness wounds on his bottom with tissue loss -diarrhea slow to improved: colonoscopy on 7/3- biopsy pending -developed numbness on right side of face, MRI brain noted   Assessment & Plan:   Colitis Subacute diarrhea -CT did note colitis of ascending and transverse colon -C. difficile PCR screen negative x1, repeat ordered was canceled by lab  -GI pathogen panel was negative  -Treated with IV ceftriaxone and Flagyl for 5 days and then changed to oral cephalosporin and Flagyl--now off antibiotics -given a dose of imodium  -GI consult appreciated:-- s/p colonoscopy 7/3:  normal appearing mucosa; biopsy without inflammation, polyp noted high-grade tubular adenoma, needs GI follow-up  2 new brain lesions- partially hemorrhagic- -found as part of evaluation of new onset right-sided facial  numbness that started 3 to 4 days ago, MRI brain noted Two partially hemorrhagic and enhancing brain masses are new since August, one located at the junction of the right brainstem and cerebellar peduncle, and one arising from the left choroid plexus. -neurosurgery consulted, mets were suspected initially -TSH normal, Bhcg not detected, -CT chest abdomen pelvis negative for primary malignancy -multiple skin lesion but per patient, none that bleed or have changed to his knowlegde -NS consult appreciated by Dr. Dutch Quint, recommended observation for now and repeat MRI brain on Monday, location not amenable to biopsy  Multiple full-thickness buttock wounds with tissue loss -Present on admission -Worsened in the setting of ongoing diarrhea, MASD -Attempt to keep this clean and dry -Continue local care, appreciate wound care consult -pain improved  Essential hypertension -Continue home regimen of atenolol but lower dose -avoid hypotension  Seizure disorder Spina bifida -Continue Keppra  Neurogenic bladder -Continue self-catheterization per home regimen  Esophagitis -Started PPI  Low b12 -repleted  DVT prophylaxis: Lovenox held due to brain lesions Code Status: Full code Family Communication:  No family at bedside Disposition Plan:  Status is: Inpatient  Remains inpatient appropriate because:Inpatient level of care appropriate due to severity of illness, CIR pending repeat MRI Brain, patient is physically limited by his spina bifida and disabilities with sacral wounds and ongoing diarrhea, and now plan for repeat MRI on Monday per neurosurgery   Dispo: The patient is from: Home              Anticipated d/c is to:CIR?              Anticipated d/c date is: Monday              Patient currently is  not medically stable to d/c.   Subjective: -feels ok overall, diarrhea is improving  Objective: Vitals:   11/01/19 1644 11/01/19 2101 11/02/19 0502 11/02/19 1034  BP: (!) 128/59  135/80 135/78 115/78  Pulse: 75 88 80 69  Resp: 18 18 18 18   Temp: 98.7 F (37.1 C) 98.9 F (37.2 C) 98 F (36.7 C) 98.4 F (36.9 C)  TempSrc: Oral Oral Oral Oral  SpO2: 96% 97% 98% 98%  Weight:      Height:        Intake/Output Summary (Last 24 hours) at 11/02/2019 1413 Last data filed at 11/02/2019 0900 Gross per 24 hour  Intake 660 ml  Output 576 ml  Net 84 ml   Filed Weights   10/27/19 2105 10/29/19 2134 10/30/19 2100  Weight: 68.7 kg 69.2 kg 69.2 kg    Examination:  Gen: Awake, Alert, Oriented X 3, no distress, short statured male HEENT: Mild facial numbness, no JVD CVS: S1-S2, regular rate rhythm Lungs: Clear bilaterally Abdomen: Soft, nontender, bowel sounds present Extremities: Short lower extremities with atrophy Skin: Full-thickness pressure wound on sacrum  Data Reviewed:   CBC: Recent Labs  Lab 10/27/19 1141  WBC 7.2  HGB 12.1*  HCT 38.6*  MCV 94.4  PLT 275   Basic Metabolic Panel: Recent Labs  Lab 10/27/19 1141  NA 141  K 4.3  CL 107  CO2 25  GLUCOSE 99  BUN 13  CREATININE 0.67  CALCIUM 8.9   GFR: Estimated Creatinine Clearance: 88.2 mL/min (by C-G formula based on SCr of 0.67 mg/dL). Liver Function Tests: No results for input(s): AST, ALT, ALKPHOS, BILITOT, PROT, ALBUMIN in the last 168 hours. No results for input(s): LIPASE, AMYLASE in the last 168 hours. No results for input(s): AMMONIA in the last 168 hours. Coagulation Profile: No results for input(s): INR, PROTIME in the last 168 hours. Cardiac Enzymes: No results for input(s): CKTOTAL, CKMB, CKMBINDEX, TROPONINI in the last 168 hours. BNP (last 3 results) No results for input(s): PROBNP in the last 8760 hours. HbA1C: No results for input(s): HGBA1C in the last 72 hours. CBG: No results for input(s): GLUCAP in the last 168 hours. Lipid Profile: No results for input(s): CHOL, HDL, LDLCALC, TRIG, CHOLHDL, LDLDIRECT in the last 72 hours. Thyroid Function Tests: Recent Labs     10/31/19 0820  TSH 3.906   Anemia Panel: No results for input(s): VITAMINB12, FOLATE, FERRITIN, TIBC, IRON, RETICCTPCT in the last 72 hours. Urine analysis:    Component Value Date/Time   COLORURINE AMBER (A) 10/19/2019 1937   APPEARANCEUR HAZY (A) 10/19/2019 1937   LABSPEC 1.026 10/19/2019 1937   PHURINE 5.0 10/19/2019 1937   GLUCOSEU NEGATIVE 10/19/2019 1937   HGBUR NEGATIVE 10/19/2019 1937   BILIRUBINUR NEGATIVE 10/19/2019 1937   KETONESUR NEGATIVE 10/19/2019 1937   PROTEINUR NEGATIVE 10/19/2019 1937   NITRITE NEGATIVE 10/19/2019 1937   LEUKOCYTESUR NEGATIVE 10/19/2019 1937    ) No results found for this or any previous visit (from the past 240 hour(s)).     Radiology Studies: CT SOFT TISSUE NECK W CONTRAST  Result Date: 10/31/2019 CLINICAL DATA:  Brain mass or lesion. 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 Unusual mass lesions right medulla. Neck CT requested to assess for primary tumor or other metastatic disease. EXAM: CT NECK WITH CONTRAST TECHNIQUE: Multidetector CT imaging of the neck was performed using the standard protocol following the bolus administration of intravenous contrast. CONTRAST:  18mL OMNIPAQUE IOHEXOL 300 MG/ML  SOLN COMPARISON:  12/25/2018.  MRI 10/30/2019. FINDINGS: Pharynx and larynx: No mucosal or submucosal mass. Salivary glands: Parotid and submandibular glands are normal. Thyroid: Normal Lymph nodes: No enlarged or low-density nodes on either side of the neck. Vascular: Carotid bifurcations appear widely patent. Jugular veins are patent. Limited intracranial: See results of brain MRI. Visualized orbits: Not included. Mastoids and visualized paranasal sinuses: Clear Skeleton: Ordinary cervical spondylosis. Question abnormal enhancement in the epidural space or along the surface of the cord in the C2-3 through C4 region. Consider cervical MRI. Upper chest: See results of chest CT. Other: None IMPRESSION: No evidence of  primary soft tissue mass lesion in the neck region. Question if there is some abnormal enhancement along the surface of the cord in the region from C2-3 through C4. This would be better evaluated with MRI of the cervical spine with and without contrast. This could relate to the chronic treated Chiari malformation. Electronically Signed   By: Paulina Fusi M.D.   On: 10/31/2019 17:25   CT CHEST WO CONTRAST  Result Date: 10/31/2019 CLINICAL DATA:  Intracranial metastatic disease, unknown primary. Chiari 2 malformation. EXAM: CT CHEST WITHOUT CONTRAST TECHNIQUE: Multidetector CT imaging of the chest was performed following the standard protocol without IV contrast. COMPARISON:  None. FINDINGS: Cardiovascular: There is mild left ventricular dilation noted with global cardiac size within normal limits. Mild coronary artery calcification. Trace pericardial fluid is likely physiologic. Thoracic vasculature is unremarkable on this noncontrast examination. Mediastinum/Nodes: A 17 mm indeterminate nodule is seen within the right thyroid lobe, axial image number sign 14/3. No pathologic thoracic adenopathy. Lungs/Pleura: A 7 mm subpleural noncalcified indeterminate pulmonary nodule is seen within the inferior lingula at axial image number sign 77/4. The lungs are otherwise clear. No pneumothorax or pleural effusion. Central airways are widely patent. Upper Abdomen: Mild splenomegaly is identified, stable since prior CT examination of 10/19/2019, with the spleen measuring 13.8 cm in greatest dimension. Gallstones are again identified within a contracted gallbladder. There is thickening of the distal esophagus circumferentially, a nonspecific finding that can be seen in the setting of esophagitis, such as reflux esophagitis. Musculoskeletal: Spina bifida of the a visualized thoracolumbar spine with diastasis of the a posterior elements of T11, T12 noted, in keeping with the patient's given history of Chiari malformation. No  lytic or blastic bone lesions are seen. Shunt catheter tubing is seen within the a right anterior chest and appears discontiguous. IMPRESSION: 1. No definite primary source identified for the patient's intracranial metastatic disease. 1. Indeterminate lingular nodule. 2. Indeterminate left thyroid nodule. Given the patient's hemorrhagic intracranial metastatic disease, dedicated thyroid sonography is recommended for further evaluation 3. Findings in keeping with the patient's known diagnosis of Chiari 2 malformation. Shunt catheter tubing within the right anterior chest appears discontiguous and may be nonfunctional. Electronically Signed   By: Helyn Numbers MD   On: 10/31/2019 17:20   US SCROTUM  Result Date: 10/31/2019 CLINICAL DATA:  Brain mass EXAM: ULTRASOUND OF SCROTUM TECHNIQUE: Complete ultrasound examination of the testicles, epididymis, and other scrotal structures was performed. COMPARISON:  None. FINDINGS: Right testicle Measurements: 4 x 2.7 x 2.8 cm. No mass or microlithiasis visualized. Left testicle Measurements: 3.6 x 2.1 x 3 cm. No mass or microlithiasis visualized. Right epididymis:  Normal in size and appearance. Left epididymis:  Normal in size and appearance. Hydrocele:  Tiny left hydrocele. Varicocele:  None visualized. IMPRESSION: Tiny left hydrocele. Negative for scrotal or testicular mass lesion. Electronically Signed   By: Jasmine Pang  M.D.   On: 10/31/2019 21:24      Scheduled Meds: . atenolol  12.5 mg Oral BID  . cholecalciferol  2,000 Units Oral Daily  . Gerhardt's butt cream   Topical 6 X Daily  . l-methylfolate-B6-B12  1 tablet Oral Daily  . lactobacillus acidophilus  2 tablet Oral Daily  . levETIRAcetam  1,500 mg Oral BID  . multivitamin with minerals  1 tablet Oral Daily  . nystatin cream   Topical BID  . pantoprazole  40 mg Oral QHS   Continuous Infusions:    LOS: 13 days    Time spent: 25min  Zannie CovePreetha Mathieu Schloemer, MD [facility] 11/02/2019, 2:13 PM

## 2019-11-02 NOTE — Progress Notes (Signed)
Inpatient Rehabilitation Admissions Coordinator  I met at bedside with patient to discuss his goals for rehab and venue preference. I will follow up with his medical workup and discuss with rehab team.  Barbara Boyette, RN, MSN Rehab Admissions Coordinator [phone] 11/02/2019 12:23 PM  

## 2019-11-03 NOTE — Plan of Care (Signed)
  Problem: Safety: Goal: Ability to remain free from injury will improve Outcome: Progressing   

## 2019-11-03 NOTE — Progress Notes (Signed)
PT Cancellation Note  Patient Details Name: Carlos Ferguson MRN: [id number] DOB: 06-30-1965   Cancelled Treatment:    Reason Eval/Treat Not Completed: Patient at procedure or test/unavailable Pt off floor. Will follow.   Blake Divine A Renea Schoonmaker 11/03/2019, 9:27 AM [place], PT, DPT Acute Rehabilitation Services Pager [phone] Office [phone]

## 2019-11-03 NOTE — Progress Notes (Signed)
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 -approximately 5 weeks or so ago he was started on an antibiotic therapy for suspected catheter associated urinary tract infection.  Around this time, patient states that he began to develop diarrhea.  Patient explains that this diarrhea is watery with occasional small amounts of blood. -In the weeks that followed, patient was placed on courses of both Bactrim and nitrofurantoin for suspected urinary tract infection.   -Presented to [facility] emergency room, work-up in the ED noted concern for ascending and transverse colitis, also noted to have low-grade temp of 100.5 and a white count 13.9, in addition noted to have full-thickness wounds on his bottom with tissue loss -diarrhea slow to improved: colonoscopy on 7/3- biopsy pending -developed numbness on right side of face, MRI brain noted   Assessment & Plan:   Colitis Subacute diarrhea -CT on admission noted colitis of ascending and transverse colon -C. difficile PCR screen negative x1, repeat ordered was canceled by lab  -GI pathogen panel was negative  -Treated with IV ceftriaxone and Flagyl for 5 days and then changed to oral cephalosporin and Flagyl--now off antibiotics -given PRN imodium as well -GI consult appreciated:-- s/p colonoscopy 7/3:  normal appearing mucosa; biopsy without inflammation, polyp noted high-grade tubular adenoma, needs GI follow-up  2 New brain lesions- partially hemorrhagic- -found as part of evaluation of new onset right-sided  facial numbness that started 3 to 4 days ago, MRI brain noted Two partially hemorrhagic and enhancing brain masses are new since August, one located at the junction of the right brainstem and cerebellar peduncle, and one arising from the left choroid plexus. -neurosurgery consulted, mets were suspected initially -TSH normal, Bhcg not detected, -CT chest abdomen pelvis negative for primary malignancy, multiple skin lesion but per patient, none that bleed or have changed to his knowlegde -NS consult appreciated, seen by Dr. Dutch Quint, recommended observation for now and repeat MRI brain on Monday, location not amenable to biopsy  Multiple full-thickness buttock wounds with tissue loss -Present on admission -Worsened in the setting of ongoing diarrhea, MASD -Attempt to keep this clean and dry -Continue local care, appreciate wound care consult -pain improved  Essential hypertension -Continue home regimen of atenolol but lower dose -avoid hypotension  Seizure disorder Spina bifida -Continue Keppra  Neurogenic bladder -Continue self-catheterization per home regimen  Esophagitis -Started PPI  Low b12 -repleted  DVT prophylaxis: Lovenox held due to brain lesions Code Status: Full code Family Communication:  No family at bedside Disposition Plan:  Status is: Inpatient  Remains inpatient appropriate because:Inpatient level of care appropriate due to severity of illness, CIR pending repeat MRI Brain, patient is physically limited by his spina bifida and disabilities with sacral wounds and ongoing diarrhea, and now plan for repeat MRI on Monday per neurosurgery   Dispo: The patient is from: Home              Anticipated d/c is to:CIR?              Anticipated d/c date is: Monday              Patient currently  is not medically stable to d/c.   Subjective: -No events overnight, overall improving diarrhea, tolerating p.o., working with PT  Objective: Vitals:   11/02/19 1034 11/02/19  2108 11/03/19 0512 11/03/19 1010  BP: 115/78 130/71 132/70 130/75  Pulse: 69 71 70 62  Resp: 18 19 18    Temp: 98.4 F (36.9 C) 99.3 F (37.4 C) 98.9 F (37.2 C) 97.7 F (36.5 C)  TempSrc: Oral Oral Oral Oral  SpO2: 98% 96% 98% 99%  Weight:      Height:        Intake/Output Summary (Last 24 hours) at 11/03/2019 1336 Last data filed at 11/03/2019 1000 Gross per 24 hour  Intake 420 ml  Output 675 ml  Net -255 ml   Filed Weights   10/27/19 2105 10/29/19 2134 10/30/19 2100  Weight: 68.7 kg 69.2 kg 69.2 kg    Examination:  Gen: Awake alert oriented x3  no distress, short statured male HEENT: Mild facial numbness, no JVD CVS: S1-S2, regular rate rhythm Lungs: Clear Abdomen: Soft, nontender, bowel sounds present Extremities: Short lower extremities with atrophy Skin: Full-thickness pressure wound on sacrum  Data Reviewed:   CBC: No results for input(s): WBC, NEUTROABS, HGB, HCT, MCV, PLT in the last 168 hours. Basic Metabolic Panel: No results for input(s): NA, K, CL, CO2, GLUCOSE, BUN, CREATININE, CALCIUM, MG, PHOS in the last 168 hours. GFR: Estimated Creatinine Clearance: 88.2 mL/min (by C-G formula based on SCr of 0.67 mg/dL). Liver Function Tests: No results for input(s): AST, ALT, ALKPHOS, BILITOT, PROT, ALBUMIN in the last 168 hours. No results for input(s): LIPASE, AMYLASE in the last 168 hours. No results for input(s): AMMONIA in the last 168 hours. Coagulation Profile: No results for input(s): INR, PROTIME in the last 168 hours. Cardiac Enzymes: No results for input(s): CKTOTAL, CKMB, CKMBINDEX, TROPONINI in the last 168 hours. BNP (last 3 results) No results for input(s): PROBNP in the last 8760 hours. HbA1C: No results for input(s): HGBA1C in the last 72 hours. CBG: No results for input(s): GLUCAP in the last 168 hours. Lipid Profile: No results for input(s): CHOL, HDL, LDLCALC, TRIG, CHOLHDL, LDLDIRECT in the last 72 hours. Thyroid Function Tests: No  results for input(s): TSH, T4TOTAL, FREET4, T3FREE, THYROIDAB in the last 72 hours. Anemia Panel: No results for input(s): VITAMINB12, FOLATE, FERRITIN, TIBC, IRON, RETICCTPCT in the last 72 hours. Urine analysis:    Component Value Date/Time   COLORURINE AMBER (A) 10/19/2019 1937   APPEARANCEUR HAZY (A) 10/19/2019 1937   LABSPEC 1.026 10/19/2019 1937   PHURINE 5.0 10/19/2019 1937   GLUCOSEU NEGATIVE 10/19/2019 1937   HGBUR NEGATIVE 10/19/2019 1937   BILIRUBINUR NEGATIVE 10/19/2019 1937   KETONESUR NEGATIVE 10/19/2019 1937   PROTEINUR NEGATIVE 10/19/2019 1937   NITRITE NEGATIVE 10/19/2019 1937   LEUKOCYTESUR NEGATIVE 10/19/2019 1937    ) No results found for this or any previous visit (from the past 240 hour(s)).     Radiology Studies: No results found.    Scheduled Meds: . atenolol  12.5 mg Oral BID  . cholecalciferol  2,000 Units Oral Daily  . Gerhardt's butt cream   Topical 6 X Daily  . l-methylfolate-B6-B12  1 tablet Oral Daily  . lactobacillus acidophilus  2 tablet Oral Daily  . levETIRAcetam  1,500 mg Oral BID  . multivitamin with minerals  1 tablet Oral Daily  . nystatin cream   Topical BID  . pantoprazole  40 mg Oral QHS   Continuous Infusions:    LOS:  14 days   Time spent:  Zannie Cove, MD [facility] 11/03/2019, 1:36 PM

## 2019-11-03 NOTE — Progress Notes (Signed)
Physical Therapy Treatment Patient Details Name: Carlos Ferguson MRN: [id number] DOB: [birth year] Today's Date: 11/03/2019    History of Present Illness Pt is a 54 y/o male admitted secondary to diarrhea and has bilateral buttock wounds. Imaging revelaed colitis of ascending and transverse colon. PMH includes spina bifida, CVA, chiari malformation s/p shunt, CVA, seizure, and HTN.     PT Comments    Patient progressing slowly towards PT goals. Requires more assist with transfers and gait today due to BLE weakness and fatigue. Today, pt requires Min guard-Mod A for standing from low surfaces. Gait distance limited due to stool incontinence. Tolerated standing for ~1-2 minutes at a time for pericare. Performed multiple sit to stands for functional mobility today. Motivated to return to PLOF. Pt is a great CIR candidate. Will continue to follow.   Follow Up Recommendations  CIR     Equipment Recommendations  None recommended by PT    Recommendations for Other Services       Precautions / Restrictions Precautions Precautions: Fall Required Braces or Orthoses: Other Brace Other Brace: R AFO, L KAFO Restrictions Weight Bearing Restrictions: No    Mobility  Bed Mobility Overal bed mobility: Modified Independent Bed Mobility: Supine to Sit     Supine to sit: Modified independent (Device/Increase time);HOB elevated     General bed mobility comments: Increased time scooting to EOB.  Transfers Overall transfer level: Needs assistance Equipment used: Crutches Transfers: Sit to/from Stand Sit to Stand: Min assist;Mod assist;Min guard Stand pivot transfers: Min assist       General transfer comment: Patient likes to slide off the end of the bed to stand with Bil crutches blocking right crutch to prevent it from slipping; requires Min-Mod A to stand from chair and BSC due to lower surface using crutches. Multiple stands from all surfaces. SPT to [facility] when incontinent of stool during  gait.  Ambulation/Gait Ambulation/Gait assistance: Min assist;+2 safety/equipment Gait Distance (Feet): 8 Feet Assistive device: Crutches Gait Pattern/deviations: Decreased stride length;Narrow base of support;Step-to pattern Gait velocity: decreased   General Gait Details: Patient does a bit of a hop with both feet at the same time forward vs Short steps with increased weight bearing through BUEs on crutches. narrow BoS and CoM is anterior to LEs. Fatigues. Limited due to incontinence of stool with walking.   Stairs             Wheelchair Mobility    Modified Rankin (Stroke Patients Only)       Balance Overall balance assessment: Needs assistance Sitting-balance support: Bilateral upper extremity supported Sitting balance-Leahy Scale: Good Sitting balance - Comments: Able to assist with donning braces in bed   Standing balance support: During functional activity;Bilateral upper extremity supported Standing balance-Leahy Scale: Poor Standing balance comment: Reliant on bUE support and external assist at this time for safety. BLe weakness present.                            Cognition Arousal/Alertness: Awake/alert Behavior During Therapy: WFL for tasks assessed/performed Overall Cognitive Status: Within Functional Limits for tasks assessed                                        Exercises      General Comments        Pertinent Vitals/Pain Pain Assessment: No/denies pain    Home  Living                      Prior Function            PT Goals (current goals can now be found in the care plan section) Progress towards PT goals: Progressing toward goals (slowly, limited by stool incontinence and weakness)    Frequency    Min 3X/week      PT Plan Current plan remains appropriate    Co-evaluation              AM-PAC PT "6 Clicks" Mobility   Outcome Measure  Help needed turning from your back to your side  while in a flat bed without using bedrails?: None Help needed moving from lying on your back to sitting on the side of a flat bed without using bedrails?: A Little Help needed moving to and from a bed to a chair (including a wheelchair)?: A Little Help needed standing up from a chair using your arms (e.g., wheelchair or bedside chair)?: A Lot Help needed to walk in hospital room?: A Lot Help needed climbing 3-5 steps with a railing? : Total 6 Click Score: 15    End of Session Equipment Utilized During Treatment: Gait belt Activity Tolerance: Patient tolerated treatment well Patient left: in chair;with call bell/phone within reach;with chair alarm set Nurse Communication: Mobility status;Other (comment) (replace foam on buttock due to sores as other one was removed due to being soiled.) PT Visit Diagnosis: Muscle weakness (generalized) (M62.81);Other abnormalities of gait and mobility (R26.89)     Time: 1100-1144 PT Time Calculation (min) (ACUTE ONLY): 44 min  Charges:  $Gait Training: 8-22 mins $Therapeutic Activity: 23-37 mins                     Vale Haven, PT, DPT Acute Rehabilitation Services Pager [phone] Office [phone]       Carlos Ferguson 11/03/2019, 12:43 PM

## 2019-11-04 MED ORDER — LOPERAMIDE HCL 2 MG PO CAPS
2.0000 mg | ORAL_CAPSULE | Freq: Every day | ORAL | Status: DC | PRN
  Administered 2019-11-04 – 2019-11-06 (×3): 2 mg via ORAL
  Filled 2019-11-04 (×4): qty 1

## 2019-11-04 NOTE — Progress Notes (Signed)
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 -approximately 5 weeks or so ago he was started on an antibiotic therapy for suspected catheter associated urinary tract infection.  Around this time, patient states that he began to develop diarrhea.  Patient explains that this diarrhea is watery with occasional small amounts of blood. -In the weeks that followed, patient was placed on courses of both Bactrim and nitrofurantoin for suspected urinary tract infection.   -Presented to [facility] emergency room, work-up in the ED noted concern for ascending and transverse colitis, also noted to have low-grade temp of 100.5 and a white count 13.9, in addition noted to have full-thickness wounds on his bottom with tissue loss -diarrhea slow to improved: colonoscopy on 7/3- biopsy pending -developed numbness on right side of face, MRI brain noted   Assessment & Plan:   Colitis Subacute diarrhea -CT on admission noted colitis of ascending and transverse colon -C. difficile PCR negative   -GI pathogen panel was negative  -Treated with IV ceftriaxone and Flagyl for 5 days and then changed to oral cephalosporin and Flagyl--now off antibiotics -GI consult appreciated:-- s/p colonoscopy 7/3:  normal appearing mucosa; biopsy without inflammation, polyp noted high-grade tubular adenoma, needs GI follow-up -As needed Imodium  2 New brain lesions- partially hemorrhagic- -found as part of evaluation of new onset right-sided facial numbness that started 3 to 4 days ago, MRI  brain noted Two partially hemorrhagic and enhancing brain masses are new since August, one located at the junction of the right brainstem and cerebellar peduncle, and one arising from the left choroid plexus. -neurosurgery consulted, mets were suspected initially -TSH normal, Bhcg not detected, -CT chest abdomen pelvis negative for primary malignancy, multiple skin lesion but per patient, none that bleed or have changed to his knowlegde -NS consult appreciated, seen by Dr. Dutch Quint, recommended observation for now and repeat MRI brain on Monday, location not amenable to biopsy -If stable will likely DC to CIR followed by outpatient follow-up  Multiple full-thickness buttock wounds with tissue loss -Present on admission -Worsened in the setting of ongoing diarrhea, MASD -Attempt to keep this clean and dry -Continue local care, appreciate wound care consult -pain improved  Essential hypertension -Continue home regimen of atenolol but lower dose -avoid hypotension  Seizure disorder Spina bifida -Continue Keppra  Neurogenic bladder -Continue self-catheterization per home regimen  Esophagitis -Started PPI  Low b12 -repleted  DVT prophylaxis: Lovenox held due to brain lesions Code Status: Full code Family Communication:  No family at bedside Disposition Plan:  Status is: Inpatient  Remains inpatient appropriate because:Inpatient level of care appropriate due to severity of illness, CIR pending repeat MRI Brain, patient is physically limited by his spina bifida and disabilities with sacral wounds and ongoing diarrhea, and now plan for repeat MRI on Monday per neurosurgery   Dispo: The patient is from: Home              Anticipated d/c is to:CIR?              Anticipated d/c date is: Monday  Patient currently is not medically stable to d/c.   Subjective: -Mild recurrence of diarrhea last night, otherwise feels okay, working with PT  Objective: Vitals:   11/03/19  1652 11/03/19 2057 11/04/19 0451 11/04/19 0908  BP: 134/70 126/66 126/74 (!) 129/55  Pulse: 64 81 62 70  Resp: 16 16 16 18   Temp: 98.6 F (37 C) 99.1 F (37.3 C) 97.8 F (36.6 C) 98.6 F (37 C)  TempSrc: Oral Oral Oral Oral  SpO2: 96% 97% 98% 98%  Weight:      Height:        Intake/Output Summary (Last 24 hours) at 11/04/2019 1152 Last data filed at 11/04/2019 1100 Gross per 24 hour  Intake 582 ml  Output 250 ml  Net 332 ml   Filed Weights   10/27/19 2105 10/29/19 2134 10/30/19 2100  Weight: 68.7 kg 69.2 kg 69.2 kg    Examination:  Gen: Awake alert, oriented x3, no distress, short statured male HEENT: No JVD, mild facial numbness CVS: S1-S2, regular rate rhythm Lungs: Clear Abdomen: Soft, nontender, bowel sounds present Extremities: Short lower extremities with atrophy Skin: Full-thickness pressure wound on sacrum  Data Reviewed:   CBC: No results for input(s): WBC, NEUTROABS, HGB, HCT, MCV, PLT in the last 168 hours. Basic Metabolic Panel: No results for input(s): NA, K, CL, CO2, GLUCOSE, BUN, CREATININE, CALCIUM, MG, PHOS in the last 168 hours. GFR: Estimated Creatinine Clearance: 88.2 mL/min (by C-G formula based on SCr of 0.67 mg/dL). Liver Function Tests: No results for input(s): AST, ALT, ALKPHOS, BILITOT, PROT, ALBUMIN in the last 168 hours. No results for input(s): LIPASE, AMYLASE in the last 168 hours. No results for input(s): AMMONIA in the last 168 hours. Coagulation Profile: No results for input(s): INR, PROTIME in the last 168 hours. Cardiac Enzymes: No results for input(s): CKTOTAL, CKMB, CKMBINDEX, TROPONINI in the last 168 hours. BNP (last 3 results) No results for input(s): PROBNP in the last 8760 hours. HbA1C: No results for input(s): HGBA1C in the last 72 hours. CBG: No results for input(s): GLUCAP in the last 168 hours. Lipid Profile: No results for input(s): CHOL, HDL, LDLCALC, TRIG, CHOLHDL, LDLDIRECT in the last 72 hours. Thyroid  Function Tests: No results for input(s): TSH, T4TOTAL, FREET4, T3FREE, THYROIDAB in the last 72 hours. Anemia Panel: No results for input(s): VITAMINB12, FOLATE, FERRITIN, TIBC, IRON, RETICCTPCT in the last 72 hours. Urine analysis:    Component Value Date/Time   COLORURINE AMBER (A) 10/19/2019 1937   APPEARANCEUR HAZY (A) 10/19/2019 1937   LABSPEC 1.026 10/19/2019 1937   PHURINE 5.0 10/19/2019 1937   GLUCOSEU NEGATIVE 10/19/2019 1937   HGBUR NEGATIVE 10/19/2019 1937   BILIRUBINUR NEGATIVE 10/19/2019 1937   KETONESUR NEGATIVE 10/19/2019 1937   PROTEINUR NEGATIVE 10/19/2019 1937   NITRITE NEGATIVE 10/19/2019 1937   LEUKOCYTESUR NEGATIVE 10/19/2019 1937    ) No results found for this or any previous visit (from the past 240 hour(s)).     Radiology Studies: No results found.    Scheduled Meds: . atenolol  12.5 mg Oral BID  . cholecalciferol  2,000 Units Oral Daily  . Gerhardt's butt cream   Topical 6 X Daily  . l-methylfolate-B6-B12  1 tablet Oral Daily  . lactobacillus acidophilus  2 tablet Oral Daily  . levETIRAcetam  1,500 mg Oral BID  . multivitamin with minerals  1 tablet Oral Daily  . nystatin cream   Topical BID  . pantoprazole  40 mg Oral QHS   Continuous Infusions:  LOS: 15 days   Time spent:  Zannie Cove, MD [facility] 11/04/2019, 11:52 AM

## 2019-11-04 NOTE — Plan of Care (Signed)
  Problem: Activity: Goal: Risk for activity intolerance will decrease Outcome: Progressing   Problem: Safety: Goal: Ability to remain free from injury will improve Outcome: Progressing   Problem: Skin Integrity: Goal: Risk for impaired skin integrity will decrease Outcome: Progressing   

## 2019-11-04 NOTE — Progress Notes (Signed)
Pt refused PIV insertion at this time--- pt wanted to have PIV inserted on early Monday for his MRI.

## 2019-11-05 ENCOUNTER — Inpatient Hospital Stay (HOSPITAL_COMMUNITY): Payer: Medicare Other

## 2019-11-05 LAB — BASIC METABOLIC PANEL
Anion gap: 10 (ref 5–15)
BUN: 11 mg/dL (ref 6–20)
CO2: 26 mmol/L (ref 22–32)
Calcium: 9.2 mg/dL (ref 8.9–10.3)
Chloride: 103 mmol/L (ref 98–111)
Creatinine, Ser: 0.69 mg/dL (ref 0.61–1.24)
GFR calc Af Amer: >60 mL/min (ref >60)
GFR calc non Af Amer: >60 mL/min (ref >60)
Glucose, Bld: 102 mg/dL — ABNORMAL HIGH (ref 70–99)
Potassium: 4.5 mmol/L (ref 3.5–5.1)
Sodium: 139 mmol/L (ref 135–145)

## 2019-11-05 MED ORDER — GADOBUTROL 1 MMOL/ML IV SOLN
7.0000 mL | Freq: Once | INTRAVENOUS | Status: AC | PRN
  Administered 2019-11-05: 7 mL via INTRAVENOUS

## 2019-11-05 NOTE — Progress Notes (Signed)
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 -approximately 5 weeks or so ago he was started on an antibiotic therapy for suspected catheter associated urinary tract infection.  Around this time, patient states that he began to develop diarrhea.  Patient explains that this diarrhea is watery with occasional small amounts of blood. -In the weeks that followed, patient was placed on courses of both Bactrim and nitrofurantoin for suspected urinary tract infection.   -Presented to [facility] emergency room, work-up in the ED noted concern for ascending and transverse colitis, also noted to have low-grade temp of 100.5 and a white count 13.9, in addition noted to have full-thickness wounds on his bottom with tissue loss -diarrhea slow to improved: colonoscopy on 7/3- biopsy pending -developed numbness on right side of face, MRI brain noted to new lesions in the right brainstem and cerebellar peduncle  Assessment & Plan:   Colitis Subacute diarrhea -CT on admission noted colitis of ascending and transverse colon -C. difficile PCR negative   -GI pathogen panel was negative  -Treated with IV ceftriaxone and Flagyl for 5 days and then changed to oral cephalosporin and Flagyl--now off antibiotics -GI consult appreciated:-- s/p colonoscopy 7/3:  normal appearing mucosa; biopsy without inflammation, polyp noted high-grade tubular adenoma, needs GI follow-up -PRN Imodium -DC planning  2 New brain lesions- partially hemorrhagic- -found as part of evaluation of new  onset right-sided facial numbness that started 3 to 4 days ago, MRI brain noted Two partially hemorrhagic and enhancing brain masses are new since August, one located at the junction of the right brainstem and cerebellar peduncle, and one arising from the left choroid plexus. -neurosurgery consulted, mets were suspected initially -TSH normal, Bhcg not detected, -CT chest abdomen pelvis negative for primary malignancy, multiple skin lesion but per patient, none that bleed or have changed to his knowlegde -NS consult appreciated, seen by Dr. Dutch Quint, recommended observation for now and repeat MRI brain on Monday, location not amenable to biopsy, will order repeat MRI -If stable will likely DC to CIR followed by outpatient follow-up  Multiple full-thickness buttock wounds with tissue loss -Present on admission -Worsened in the setting of ongoing diarrhea, MASD -Attempt to keep this clean and dry -Continue local care, appreciate wound care consult -pain improved  Essential hypertension -Continue home regimen of atenolol but lower dose -avoid hypotension  Seizure disorder Spina bifida -Continue Keppra  Neurogenic bladder -Continue self-catheterization per home regimen  Esophagitis -Started PPI  Low b12 -repleted  DVT prophylaxis: Lovenox held due to brain lesions Code Status: Full code Family Communication:  No family at bedside Disposition Plan:  Status is: Inpatient  Remains inpatient appropriate because:Inpatient level of care appropriate due to severity of illness, CIR pending repeat MRI Brain, patient is physically limited by his spina bifida and disabilities with sacral wounds and ongoing diarrhea, and now plan for repeat MRI on Monday per neurosurgery   Dispo: The patient is from: Home              Anticipated d/c is to:CIR?              Anticipated d/c date is:  Monday              Patient currently is not medically stable to d/c.   Subjective: -Feels okay, still with  some mild diarrhea, working with PT  Objective: Vitals:   11/04/19 0908 11/04/19 2201 11/05/19 0443 11/05/19 0905  BP: (!) 129/55 134/81 (!) 126/52 124/66  Pulse: 70 61 70 72  Resp: 18 14 12 16   Temp: 98.6 F (37 C) 99.5 F (37.5 C) 98.2 F (36.8 C) 98.8 F (37.1 C)  TempSrc: Oral Oral Oral Oral  SpO2: 98% 97% 97% 98%  Weight:      Height:        Intake/Output Summary (Last 24 hours) at 11/05/2019 1313 Last data filed at 11/05/2019 1000 Gross per 24 hour  Intake 360 ml  Output 1100 ml  Net -740 ml   Filed Weights   10/27/19 2105 10/29/19 2134 10/30/19 2100  Weight: 68.7 kg 69.2 kg 69.2 kg    Examination:  Gen: Awake alert oriented x3, no distress, short statured male HEENT: No JVD, mild facial numbness CVS: S1-S2, regular rate rhythm Lungs: Clear Abdomen: Soft, nontender, bowel sounds present Extremities: Short lower extremities with atrophy Skin: Full-thickness pressure wound on sacrum  Data Reviewed:   CBC: No results for input(s): WBC, NEUTROABS, HGB, HCT, MCV, PLT in the last 168 hours. Basic Metabolic Panel: Recent Labs  Lab 11/05/19 0632  NA 139  K 4.5  CL 103  CO2 26  GLUCOSE 102*  BUN 11  CREATININE 0.69  CALCIUM 9.2   GFR: Estimated Creatinine Clearance: 88.2 mL/min (by C-G formula based on SCr of 0.69 mg/dL). Liver Function Tests: No results for input(s): AST, ALT, ALKPHOS, BILITOT, PROT, ALBUMIN in the last 168 hours. No results for input(s): LIPASE, AMYLASE in the last 168 hours. No results for input(s): AMMONIA in the last 168 hours. Coagulation Profile: No results for input(s): INR, PROTIME in the last 168 hours. Cardiac Enzymes: No results for input(s): CKTOTAL, CKMB, CKMBINDEX, TROPONINI in the last 168 hours. BNP (last 3 results) No results for input(s): PROBNP in the last 8760 hours. HbA1C: No results for input(s): HGBA1C in the last 72 hours. CBG: No results for input(s): GLUCAP in the last 168 hours. Lipid Profile: No  results for input(s): CHOL, HDL, LDLCALC, TRIG, CHOLHDL, LDLDIRECT in the last 72 hours. Thyroid Function Tests: No results for input(s): TSH, T4TOTAL, FREET4, T3FREE, THYROIDAB in the last 72 hours. Anemia Panel: No results for input(s): VITAMINB12, FOLATE, FERRITIN, TIBC, IRON, RETICCTPCT in the last 72 hours. Urine analysis:    Component Value Date/Time   COLORURINE AMBER (A) 10/19/2019 1937   APPEARANCEUR HAZY (A) 10/19/2019 1937   LABSPEC 1.026 10/19/2019 1937   PHURINE 5.0 10/19/2019 1937   GLUCOSEU NEGATIVE 10/19/2019 1937   HGBUR NEGATIVE 10/19/2019 1937   BILIRUBINUR NEGATIVE 10/19/2019 1937   KETONESUR NEGATIVE 10/19/2019 1937   PROTEINUR NEGATIVE 10/19/2019 1937   NITRITE NEGATIVE 10/19/2019 1937   LEUKOCYTESUR NEGATIVE 10/19/2019 1937    ) No results found for this or any previous visit (from the past 240 hour(s)).     Radiology Studies: No results found.    Scheduled Meds:  atenolol  12.5 mg Oral BID   cholecalciferol  2,000 Units Oral Daily   Gerhardt's butt cream   Topical 6 X Daily   l-methylfolate-B6-B12  1 tablet Oral Daily   lactobacillus acidophilus  2 tablet Oral Daily   levETIRAcetam  1,500 mg Oral BID   multivitamin with minerals  1 tablet Oral Daily   nystatin cream   Topical BID   pantoprazole  40 mg Oral QHS   Continuous Infusions:    LOS: 16 days   Time spent:  Zannie Cove, MD [facility] 11/05/2019, 1:13 PM

## 2019-11-05 NOTE — Plan of Care (Signed)
  Problem: Education: Goal: Knowledge of General Education information will improve Description: Including pain rating scale, medication(s)/side effects and non-pharmacologic comfort measures Outcome: Progressing   Problem: Elimination: Goal: Will not experience complications related to bowel motility Outcome: Progressing   

## 2019-11-05 NOTE — Progress Notes (Signed)
Occupational Therapy Treatment Patient Details Name: Carlos Ferguson MRN: [id number] DOB: 1965/09/17 Today's Date: 11/05/2019    History of present illness Pt is a 54 y/o male admitted secondary to diarrhea and has bilateral buttock wounds. Imaging revelaed colitis of ascending and transverse colon. PMH includes spina bifida, CVA, chiari malformation s/p shunt, CVA, seizure, and HTN.    OT comments  Pt. Seen for skilled OT treatment session.  Mod I for bed mobility, min a for LB dressing of shoes.  Able to ambulate to/from sink for standing grooming task mod a with cues to slow pace and maintain safety with crutches.  Ready for continued therapy.  Remains excellent CIR candidate.  States eagerly "you need to understand I was driving and doing everything for myself so im going to get back to that".    Follow Up Recommendations  CIR    Equipment Recommendations  None recommended by OT    Recommendations for Other Services Rehab consult    Precautions / Restrictions Precautions Precautions: Fall Required Braces or Orthoses: Other Brace Other Brace: R AFO, L KAFO Restrictions Weight Bearing Restrictions: No       Mobility Bed Mobility Overal bed mobility: Modified Independent Bed Mobility: Supine to Sit     Supine to sit: Modified independent (Device/Increase time);HOB elevated     General bed mobility comments: Increased time scooting to EOB.  Transfers Overall transfer level: Needs assistance Equipment used: Crutches Transfers: Sit to/from [employer] Sit to Stand: Min assist;Mod assist Stand pivot transfers: Min assist;Mod assist       General transfer comment: Patient likes to slide off the end of the bed to stand with Bil crutches blocking right crutch to prevent it from slipping; requires Min-Mod A to stand from eob, attempting to make the bed very high. assisted with a height that matched the heigh of his reported bed height at home.  cues to get b  les in position before trying to amulate.  able to ambulate to/from sink mod support.    Balance                                           ADL either performed or assessed with clinical judgement   ADL Overall ADL's : Needs assistance/impaired     Grooming: Standing;Minimal assistance Grooming Details (indicate cue type and reason): props b ues on counter during oral care for support. reports this is how he completes it at home also             Lower Body Dressing: Minimal assistance;Bed level Lower Body Dressing Details (indicate cue type and reason): Able to don braces without assist, required min assist with donning shoes             Functional mobility during ADLs: Minimal assistance (crutches) General ADL Comments: Cues for safety     Vision       Perception     Praxis      Cognition Arousal/Alertness: Awake/alert Behavior During Therapy: WFL for tasks assessed/performed Overall Cognitive Status: Within Functional Limits for tasks assessed                                          Exercises     Shoulder Instructions  General Comments  pt. Reports being very independent has set ways of doing things that work for  [employer].  Reports he has zero interest in a ramp and doesn't understand why people always ask him if he wants one or why he doesn't have one.  States his g.mother had polio and she was stubborn and determined and she never used a ramp either.      Pertinent Vitals/ Pain       Pain Assessment: No/denies pain  Home Living                                          Prior Functioning/Environment              Frequency  Min 2X/week        Progress Toward Goals  OT Goals(current goals can now be found in the care plan section)  Progress towards OT goals: Progressing toward goals     Plan      Co-evaluation                 AM-PAC OT "6 Clicks" Daily Activity     Outcome  Measure   Help from another person eating meals?: None Help from another person taking care of personal grooming?: A Little Help from another person toileting, which includes using toliet, bedpan, or urinal?: A Little Help from another person bathing (including washing, rinsing, drying)?: A Little Help from another person to put on and taking off regular upper body clothing?: A Little Help from another person to put on and taking off regular lower body clothing?: A Little 6 Click Score: 19    End of Session Equipment Utilized During Treatment: Gait belt;Other (comment) (crutches)  OT Visit Diagnosis: Unsteadiness on feet (R26.81);Muscle weakness (generalized) (M62.81);History of falling (Z91.81)   Activity Tolerance Patient tolerated treatment well   Patient Left in bed;with call bell/phone within reach   Nurse Communication Other (comment) (alerted rn that pts buttocks wound dressing had fallen off during ambulation and needed to be re applied)        Time: 1034-1100 OT Time Calculation (min): 26 min  Charges: OT General Charges $OT Visit: 1 Visit OT Treatments $Self Care/Home Management : 23-37 mins  Carlos Ferguson, COTA/L Acute Rehabilitation [phone]   Carlos Ferguson 11/05/2019, 12:35 PM

## 2019-11-06 MED ORDER — ACETAMINOPHEN 500 MG PO TABS: 500.0000 mg | ORAL_TABLET | Freq: Four times a day (QID) | ORAL | 0 refills | Status: AC | PRN

## 2019-11-06 MED ORDER — GERHARDT'S BUTT CREAM: 1.0000 "application " | TOPICAL_CREAM | Freq: Four times a day (QID) | CUTANEOUS | Status: AC

## 2019-11-06 MED ORDER — ATENOLOL 25 MG PO TABS: 12.5000 mg | ORAL_TABLET | Freq: Two times a day (BID) | ORAL | Status: AC

## 2019-11-06 MED ORDER — LOPERAMIDE HCL 2 MG PO CAPS: 2.0000 mg | ORAL_CAPSULE | Freq: Every day | ORAL | 0 refills | Status: AC | PRN

## 2019-11-06 NOTE — Discharge Summary (Addendum)
Physician Discharge Summary  Carlos Ferguson IRJ:[id number] DOB: 1965/09/11 DOA: 10/19/2019  PCP: System, Pcp Not In  Admit date: 10/19/2019 Discharge date: 11/08/2019  Time spent: 35 minutes  Recommendations for Outpatient Follow-up:  1. GI dr.Beavers-FU Colonoscopy/biopsy 2. Follow-up MRI brain in 3 months and neurosurgery Dr. 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   Essential hypertension   Seizure disorder (HCC)   Neurogenic bladder, flaccid   Acute diarrhea   Buttock wound, unspecified laterality, initial encounter   SIRS (systemic inflammatory response syndrome) (HCC)   Open wound of left knee   Esophagitis   Colitis, acute   Polyp of transverse colon   Discharge Condition: Stable  Diet recommendation: Heart healthy  Filed Weights   10/27/19 2105 10/29/19 2134 10/30/19 2100  Weight: 68.7 kg 69.2 kg 69.2 kg    History of present illness:  54 year old male with past medical history of spina bifida, hemorrhagicstroke10/2020, seizure disorder, neurogenic bladder (self caths 4 times daily), 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 -approximately 5 weeks or so ago he was started on an antibiotic therapy for suspected catheter associated urinary tract infection. Around this time, patient states that he began to develop diarrhea. Patient explains that this diarrhea is watery with occasional small amounts of blood. -In the weeks that followed, patient was placed on courses of both Bactrim and nitrofurantoin  for suspected urinary tract infection. -Presented to [facility] emergency room, work-up in the ED noted concern for ascending and transverse colitis, also noted to have low-grade temp of 100.5 and a white count 13.9, in addition noted to have full-thickness wounds on his bottom with tissue loss   Hospital Course:   Colitis Subacute diarrhea -CT on admission noted colitis of ascending and transverse colon -C. difficile PCR negative   -GI pathogen panel was negative  -Treated with IV ceftriaxone and Flagyl for 5 days and then changed to oral cephalosporin and Flagyl--now off antibiotics -GI consult appreciated:-- s/p colonoscopy 7/3:  normal appearing mucosa; biopsy without inflammation, polyp noted high-grade tubular adenoma, needs GI follow-up -PRN Imodium used off-and-on this admission -PT OT eval completed, initially CIR was recommended,, CIR recommends SNF for short-term rehab  2 New brain lesions- partially hemorrhagic- -found as part of evaluation of new onset right-sided facial numbness that started during this hospitalization, a week ago, MRI brain noted Two partially hemorrhagic and enhancing brain masses are new since August, one located at the junction of the right brainstem and cerebellar peduncle, and one arising from the left choroid plexus. -neurosurgery consulted, mets were suspected initially -TSH normal, Bhcg not detected, -CT chest abdomen pelvis negative for primary malignancy, multiple skin lesion but per patient, none that bleed or have changed to his knowlegde -NS consult appreciated, seen by Dr. Jordan Likes, recommended observation for now and repeat MRI brain on Monday, location not amenable to biopsy, repeat MRI completed last night which demonstrated some regression of right medullary lesion, per radiology this is most consistent with resolving hemorrhage and not a neoplasm, Dr. Jordan Likes recommended MRI brain in 3 months and follow-up with him as needed  Multiple  full-thickness buttock wounds with tissue loss -Present on admission -Worsened in the setting of  ongoing diarrhea, MASD -Attempt to keep this clean and dry, frequent turning/position changes to offload the area -Continue local care, appreciate wound care consult -pain improved  Essential hypertension -Continue home regimen of atenolol but lower dose  Seizure disorder Spina bifida -Continue Keppra  Neurogenic bladder -Continue self-catheterization per home regimen  Esophagitis -Started PPI  Low b12 -repleted  Consultants GI NEurosurgery Dr.Pool  Procedures: s/p colonoscopy 7/3:  normal appearing mucosa; biopsy without inflammation, polyp noted high-grade tubular adenoma  Discharge Exam: Vitals:   11/06/19 0453 11/06/19 0837  BP: 126/76 136/77  Pulse: 62 72  Resp: 18 18  Temp: 97.9 F (36.6 C) 97.8 F (36.6 C)  SpO2: 96% 95%   Gen: Awake alert oriented x3, no distress, short statured male HEENT: No JVD, mild facial numbness CVS: S1-S2, regular rate rhythm Lungs: Clear Abdomen: Soft, nontender, bowel sounds present Extremities: Short lower extremities with atrophy Skin: Full-thickness pressure wound on sacrum Discharge Instructions    Allergies as of 11/06/2019      Reactions   Latex Anaphylaxis, Hives   Penicillins Hives, Itching      Medication List    STOP taking these medications   divalproex 500 MG 24 hr tablet Commonly known as: DEPAKOTE ER   hydrocortisone cream 1 %   lisinopril 5 MG tablet Commonly known as: ZESTRIL   liver oil-zinc oxide 40 % ointment Commonly known as: DESITIN   metroNIDAZOLE 500 MG tablet Commonly known as: Flagyl   modafinil 100 MG tablet Commonly known as: PROVIGIL   nystatin powder Commonly known as: MYCOSTATIN/NYSTOP   pseudoephedrine 30 MG tablet Commonly known as: SUDAFED   scopolamine 1 MG/3DAYS Commonly known as: TRANSDERM-SCOP   senna 8.6 MG Tabs tablet Commonly known as: SENOKOT    sulfamethoxazole-trimethoprim 800-160 MG tablet Commonly known as: BACTRIM DS     TAKE these medications   acetaminophen 500 MG tablet Commonly known as: TYLENOL Take 1 tablet (500 mg total) by mouth every 6 (six) hours as needed for mild pain. What changed: how much to take   atenolol 25 MG tablet Commonly known as: TENORMIN Take 0.5 tablets (12.5 mg total) by mouth 2 (two) times daily. What changed:   how much to take  when to take this   flavoxATE 100 MG tablet Commonly known as: URISPAS Take 1 tablet (100 mg total) by mouth 3 (three) times daily as needed for bladder spasms.   Gerhardt's butt cream Crea Apply 1 application topically 4 (four) times daily.   levETIRAcetam 750 MG tablet Commonly known as: KEPPRA Take 1,500 mg by mouth 2 (two) times daily.   loperamide 2 MG capsule Commonly known as: IMODIUM Take 1 capsule (2 mg total) by mouth daily as needed for diarrhea or loose stools.   oxybutynin 5 MG tablet Commonly known as: DITROPAN Take 1 tablet (5 mg total) by mouth 3 (three) times daily. What changed:   when to take this  reasons to take this   Probiotic Caps Take 1 capsule by mouth daily.   Vitamin D (Cholecalciferol) 50 MCG (2000 UT) Caps Take 1 capsule by mouth daily.      Allergies  Allergen Reactions   Latex Anaphylaxis and Hives   Penicillins Hives and Itching      The results of significant diagnostics from this hospitalization (including imaging, microbiology, ancillary and laboratory) are listed below for reference.    Significant Diagnostic Studies: DG Orthopantogram  Result Date: 10/29/2019 CLINICAL DATA:  54 year old male with upper lip numbness EXAM:  ORTHOPANTOGRAM/PANORAMIC COMPARISON:  None. FINDINGS: No acute displaced fracture. Multiple dental amalgams. No periapical lucencies identified. IMPRESSION: Negative for acute displaced fracture Multiple dental amalgams. Electronically Signed   By: Gilmer Mor D.O.   On:  10/29/2019 14:34   DG Chest 2 View  Result Date: 10/19/2019 CLINICAL DATA:  54 year old male with fever and cough. EXAM: CHEST - 2 VIEW COMPARISON:  Chest radiograph dated 12/25/2018. FINDINGS: There is shallow inspiration. No focal consolidation, pleural effusion, pneumothorax. Stable cardiac silhouette. No acute osseous pathology. A shunt catheter appears in similar position. IMPRESSION: No active cardiopulmonary disease. Electronically Signed   By: Elgie Collard M.D.   On: 10/19/2019 19:17   CT SOFT TISSUE NECK W CONTRAST  Result Date: 10/31/2019 CLINICAL DATA:  Brain mass or lesion. 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 Unusual mass lesions right medulla. Neck CT requested to assess for primary tumor or other metastatic disease. EXAM: CT NECK WITH CONTRAST TECHNIQUE: Multidetector CT imaging of the neck was performed using the standard protocol following the bolus administration of intravenous contrast. CONTRAST:  75mL OMNIPAQUE IOHEXOL 300 MG/ML  SOLN COMPARISON:  12/25/2018.  MRI 10/30/2019. FINDINGS: Pharynx and larynx: No mucosal or submucosal mass. Salivary glands: Parotid and submandibular glands are normal. Thyroid: Normal Lymph nodes: No enlarged or low-density nodes on either side of the neck. Vascular: Carotid bifurcations appear widely patent. Jugular veins are patent. Limited intracranial: See results of brain MRI. Visualized orbits: Not included. Mastoids and visualized paranasal sinuses: Clear Skeleton: Ordinary cervical spondylosis. Question abnormal enhancement in the epidural space or along the surface of the cord in the C2-3 through C4 region. Consider cervical MRI. Upper chest: See results of chest CT. Other: None IMPRESSION: No evidence of primary soft tissue mass lesion in the neck region. Question if there is some abnormal enhancement along the surface of the cord in the region from C2-3 through C4. This would be better evaluated with MRI of  the cervical spine with and without contrast. This could relate to the chronic treated Chiari malformation. Electronically Signed   By: Paulina Fusi M.D.   On: 10/31/2019 17:25   CT CHEST WO CONTRAST  Result Date: 10/31/2019 CLINICAL DATA:  Intracranial metastatic disease, unknown primary. Chiari 2 malformation. EXAM: CT CHEST WITHOUT CONTRAST TECHNIQUE: Multidetector CT imaging of the chest was performed following the standard protocol without IV contrast. COMPARISON:  None. FINDINGS: Cardiovascular: There is mild left ventricular dilation noted with global cardiac size within normal limits. Mild coronary artery calcification. Trace pericardial fluid is likely physiologic. Thoracic vasculature is unremarkable on this noncontrast examination. Mediastinum/Nodes: A 17 mm indeterminate nodule is seen within the right thyroid lobe, axial image number sign 14/3. No pathologic thoracic adenopathy. Lungs/Pleura: A 7 mm subpleural noncalcified indeterminate pulmonary nodule is seen within the inferior lingula at axial image number sign 77/4. The lungs are otherwise clear. No pneumothorax or pleural effusion. Central airways are widely patent. Upper Abdomen: Mild splenomegaly is identified, stable since prior CT examination of 10/19/2019, with the spleen measuring 13.8 cm in greatest dimension. Gallstones are again identified within a contracted gallbladder. There is thickening of the distal esophagus circumferentially, a nonspecific finding that can be seen in the setting of esophagitis, such as reflux esophagitis. Musculoskeletal: Spina bifida of the a visualized thoracolumbar spine with diastasis of the a posterior elements of T11, T12 noted, in keeping with the patient's given history of Chiari malformation. No lytic or blastic bone lesions are seen. Shunt catheter tubing is seen  within the a right anterior chest and appears discontiguous. IMPRESSION: 1. No definite primary source identified for the patient's  intracranial metastatic disease. 1. Indeterminate lingular nodule. 2. Indeterminate left thyroid nodule. Given the patient's hemorrhagic intracranial metastatic disease, dedicated thyroid sonography is recommended for further evaluation 3. Findings in keeping with the patient's known diagnosis of Chiari 2 malformation. Shunt catheter tubing within the right anterior chest appears discontiguous and may be nonfunctional. Electronically Signed   By: Helyn NumbersAshesh  Parikh MD   On: 10/31/2019 17:20   MR BRAIN W WO CONTRAST  Result Date: 11/05/2019 CLINICAL DATA:  Follow-up brainstem lesions. History of Chiari malformation with multiple previous intracranial hemorrhage. Question hemorrhage versus mass. EXAM: MRI HEAD WITHOUT AND WITH CONTRAST TECHNIQUE: Multiplanar, multiecho pulse sequences of the brain and surrounding structures were obtained without and with intravenous contrast. CONTRAST:  7mL GADAVIST GADOBUTROL 1 MMOL/ML IV SOLN COMPARISON:  10/30/2019.  12/25/2018.  11/14/2008. FINDINGS: Brain: Patient with chronic Chiari malformation and chronic ventriculomegaly. Chronic VP shunt entering the right occipital region. Ventricular size is stable. Lesion of the right pons and middle cerebellar peduncle has contracted slightly, measuring 7 mm in diameter compared with 8.5 previously. Edema has diminished. This favors diagnosis of evolving intraparenchymal hemorrhage. No progressive finding to suggest that this represents tumor. Small progressive intraparenchymal hemorrhage at the right parietal vertex axial image 63, more prominent than seen previously. Today this measures 9.6 mm compared with about 6 mm previously. No enhancing component to suggest mass lesion. Numerous old brain hemorrhages as seen previously with gliosis and hemosiderin deposition. The ovoid mass previously felt to be within the choroid is in fact, I think, subependymal along the floor of the ventricle. This can be best appreciated on the sagittal and  coronal imaging. This probably also represents a hemorrhage. Vascular: Major vessels at the base of the brain show flow. Skull and upper cervical spine: Negative Sinuses/Orbits: No significant sinus inflammation.  Orbits negative. Other: None IMPRESSION: Evolutionary changes of the right pons/middle cerebellar peduncle lesion consistent with evolving brain hemorrhage. The T1 bright focus is becoming smaller and the edema is resolving. The abnormality previously ascribed to the choroid plexus on the left is, I think, in fact a subependymal lesion along the floor of the lateral ventricle and is also consistent with a subacute hemorrhage. This is best appreciated on the sagittal and coronal imaging. Slightly progressive intraparenchymal hemorrhage at the right parietal vertex measuring 9.6 mm today. No significant mass effect or edema. Electronically Signed   By: Paulina FusiMark  Shogry M.D.   On: 11/05/2019 19:07   MR BRAIN W WO CONTRAST  Result Date: 10/30/2019 CLINICAL DATA:  54 year old male with history of Chiari 2 malformation, spina bifida, and intracranial hemorrhage. Expanding right side face numbness and tingling. EXAM: MRI HEAD WITHOUT AND WITH CONTRAST TECHNIQUE: Multiplanar, multiecho pulse sequences of the brain and surrounding structures were obtained without and with intravenous contrast. CONTRAST:  6mL GADAVIST GADOBUTROL 1 MMOL/ML IV SOLN COMPARISON:  Brain MRI 12/25/2018. 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 Since 12/25/2018 there are new enhancing, partially hemorrhagic masses of the right inferior cerebellar peduncle (series 12, image 16) 15 mm diameter, and the left choroid plexus (12 mm diameter image 26). Associated new brainstem and cerebellar edema (series 7, image 9). Furthermore, T2* images today reveal blood products associated with those 2 lesions as well as a new  hemorrhagic focus without definite enhancement in the  left occipital lobe (series 8, image 10). And this is superimposed on stable multifocal chronic hemorrhage in both hemispheres, most notably the anterior left frontal gyrus (series 8, image 19) and left cerebellum (which was acute on 12/25/2018). However, there has been expected evolution of the left cerebellar hemorrhage since August, with only residual encephalomalacia and hemosiderin. No other abnormal intracranial enhancement identified. No dural thickening. No restricted diffusion or evidence of acute infarction. No midline shift. Ventriculomegaly and right occipital approach ventricular shunt appear stable. No extra-axial blood. Stable pituitary. Vascular: Major intracranial vascular flow voids appear stable since August 2020. The major dural venous sinuses are enhancing and appear to be patent. Skull and upper cervical spine: Visible cervical spine and bone marrow signal appears stable. Sinuses/Orbits: Chronic Disconjugate gaze. Stable orbits and sinuses. Other: Mastoids remain clear. Internal auditory structures less well visualized today. IMPRESSION: 1. Two partially hemorrhagic and enhancing brain masses are new since August, one located at the junction of the right brainstem and cerebellar peduncle, and one arising from the left choroid plexus. And there is a new nonenhancing focus of hemorrhage in the left occipital lobe. Meanwhile, multifocal chronic cerebral hemorrhages elsewhere are stable since August, with expected evolution of the left cerebellar hemorrhage that occurred at that time. 2. New edema in the right brainstem and cerebellum associated with #1. Although underlying chronic intracranial mass effect and ventriculomegaly due to Chiari II malformation have not changed. 3. This constellation is unusual, and although bland parenchymal hemorrhages can enhance due to loss of the blood-brain-barrier, the new left choroid plexus mass strongly  suggests metastatic disease to the brain. Brain metastases from melanoma, renal cell carcinoma, thyroid carcinoma, and choriocarcinoma will commonly be hemorrhagic. Electronically Signed   By: Odessa Fleming M.D.   On: 10/30/2019 14:35   US SCROTUM  Result Date: 10/31/2019 CLINICAL DATA:  Brain mass EXAM: ULTRASOUND OF SCROTUM TECHNIQUE: Complete ultrasound examination of the testicles, epididymis, and other scrotal structures was performed. COMPARISON:  None. FINDINGS: Right testicle Measurements: 4 x 2.7 x 2.8 cm. No mass or microlithiasis visualized. Left testicle Measurements: 3.6 x 2.1 x 3 cm. No mass or microlithiasis visualized. Right epididymis:  Normal in size and appearance. Left epididymis:  Normal in size and appearance. Hydrocele:  Tiny left hydrocele. Varicocele:  None visualized. IMPRESSION: Tiny left hydrocele. Negative for scrotal or testicular mass lesion. Electronically Signed   By: Jasmine Pang M.D.   On: 10/31/2019 21:24   CT Abdomen Pelvis W Contrast  Result Date: 10/19/2019 CLINICAL DATA:  Diarrhea since taking antibiotics for recurrent UTI. Wound on buttock. Abdominal abscess/infection suspected EXAM: CT ABDOMEN AND PELVIS WITH CONTRAST TECHNIQUE: Multidetector CT imaging of the abdomen and pelvis was performed using the standard protocol following bolus administration of intravenous contrast. CONTRAST:  OMNIPAQUE IOHEXOL 300 MG/ML  SOLN COMPARISON:  CT 09/28/2019 FINDINGS: Lower chest: Hypoventilatory changes without confluent airspace disease. Mild distal esophageal wall thickening. Hepatobiliary: Decreased hepatic density consistent with steatosis. No discrete focal lesion. Decompressed gallbladder with multiple gallstones. No pericholecystic inflammation. No biliary dilatation. Pancreas: No ductal dilatation or inflammation. Spleen: Normal in size without focal abnormality. Adrenals/Urinary Tract: Normal adrenal glands. No hydronephrosis. There is early excretion of IV contrast  from both renal collecting systems on portal venous phase. No significant perinephric edema. Decompressed urinary bladder which is minimally thick walled. Stomach/Bowel: Mild distal esophageal wall thickening. Stomach unremarkable. There is no small bowel obstruction or inflammation. Motion limits portions of small bowel assessment. Normal appendix. There is submucosal  fatty infiltration involving the ascending and transverse colon with mild adjacent pericolonic edema. Liquid stool in the descending and scattered throughout the sigmoid colon. No significant colonic wall thickening. Scattered colonic diverticula without diverticulitis. Vascular/Lymphatic: Normal caliber abdominal aorta with minimal atherosclerosis. The portal vein is patent. No acute vascular findings. There multiple small retroperitoneal nodes are not enlarged by size criteria. An enlarged left inguinal node measuring 17 mm short axis, unchanged from prior exam and demonstrates normal lymph node morphology with retained fatty hila, likely reactive. There are few prominent left external iliac nodes. Reproductive: Prostate is unremarkable. Other: Mild skin thickening and soft tissue edema along the inferior gluteal crease. No definite subcutaneous fluid collection. Shunt catheter tubing in the pelvis appears abandoned. This is discontinuous with subcutaneous catheter that is intermittently seen in the lower chest and upper abdominal wall. No intra-abdominal fluid collection or ascites. No free air. Small fat containing umbilical hernia. Musculoskeletal: Stable chronic changes of spinal dysraphism. Chronic deformity of the right proximal femur and acetabulum. Multilevel anterior osteophytes at the lumbar spine. There are no acute or suspicious osseous abnormalities. IMPRESSION: 1. Mild skin thickening and soft tissue edema along the inferior gluteal crease, suspicious for cellulitis. No subcutaneous fluid collection. 2. Submucosal fatty infiltration  involving the ascending and transverse colon with mild adjacent pericolonic edema, suggesting colitis. Liquid stool in the descending and sigmoid colon, consistent with diarrheal illness. 3. Mild distal esophageal wall thickening, can be seen with reflux or esophagitis. 4. Hepatic steatosis. 5. Cholelithiasis without gallbladder inflammation. 6. Minimal colonic diverticulosis without diverticulitis. Aortic Atherosclerosis (ICD10-I70.0). Electronically Signed   By: Narda Rutherford M.D.   On: 10/19/2019 21:59    Microbiology: No results found for this or any previous visit (from the past 240 hour(s)).   Labs: Basic Metabolic Panel: Recent Labs  Lab 11/05/19 0632  NA 139  K 4.5  CL 103  CO2 26  GLUCOSE 102*  BUN 11  CREATININE 0.69  CALCIUM 9.2   Liver Function Tests: No results for input(s): AST, ALT, ALKPHOS, BILITOT, PROT, ALBUMIN in the last 168 hours. No results for input(s): LIPASE, AMYLASE in the last 168 hours. No results for input(s): AMMONIA in the last 168 hours. CBC: No results for input(s): WBC, NEUTROABS, HGB, HCT, MCV, PLT in the last 168 hours. Cardiac Enzymes: No results for input(s): CKTOTAL, CKMB, CKMBINDEX, TROPONINI in the last 168 hours. BNP: BNP (last 3 results) No results for input(s): BNP in the last 8760 hours.  ProBNP (last 3 results) No results for input(s): PROBNP in the last 8760 hours.  CBG: No results for input(s): GLUCAP in the last 168 hours.     Signed:  Zannie Cove MD.  [facility] 11/06/2019, 1:52 PM

## 2019-11-06 NOTE — Progress Notes (Signed)
Inpatient Rehabilitation Admissions Coordinator  I met with patient and his Dad at bedside. I discussed his case with Dr. Naaman Plummer. We are unable to offer admit to CIR at this time. Recommend SNF. Pt is aware and in agreement to SNF. Prefers [facility]. I have discussed with Lorriane Shire, SW and we will sign off at this time.  Danne Baxter, RN, MSN Rehab Admissions Coordinator [phone] 11/06/2019 12:36 PM

## 2019-11-06 NOTE — Progress Notes (Signed)
PT Cancellation Note  Patient Details Name: Carlos Ferguson MRN: [id number] DOB: [birth year]   Cancelled Treatment:    Reason Eval/Treat Not Completed: Other (comment).  Two attempts to get pt were made, was asleep and getting care, unavailable.  Retry at another time.   Ivar Drape 11/06/2019, 5:53 PM   Samul Dada, PT MS Acute Rehab Dept. Number: [facility] [id number] and [facility] [phone]

## 2019-11-06 NOTE — Progress Notes (Signed)
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  Radiology feels this is most consistent with resolving hemorrhage and no longer believes that there is a neoplasm.  This is certainly good news.  Patient may be progressed toward discharge.  He should have a follow-up MRI scan in 3 months.  Follow-up with me as necessary.

## 2019-11-07 LAB — SARS CORONAVIRUS 2 (TAT 6-24 HRS): SARS Coronavirus 2: NEGATIVE

## 2019-11-07 NOTE — Progress Notes (Signed)
OT Cancellation Note  Patient Details Name: Carlos Ferguson MRN: [id number] DOB: August 22, 1965   Cancelled Treatment:    Reason Eval/Treat Not Completed: Fatigue/lethargy limiting ability to participate (Pt asleep, unable to be easily awakened.) Will re-attempt OT session as time allows.  [name] 11/07/2019, 3:36 PM

## 2019-11-07 NOTE — Progress Notes (Signed)
Patient seen and examined, no changes from my discharge summary yesterday, declined by CIR for rehabilitation yesterday, now awaiting short-term rehab -Social work consulted and following  Carlos Cove, MD

## 2019-11-07 NOTE — NC FL2 (Signed)
[place] MEDICAID FL2 LEVEL OF CARE SCREENING TOOL     IDENTIFICATION  Patient Name: Carlos Ferguson Birthdate: [birth year] Sex: male Admission Date (Current Location): 10/19/2019  [facility] and [place] Number:   (Patient lives in [place], [place])   Facility and Address:         Provider Number: [id number]  Attending Physician Name and Address:  Zannie Cove, MD  Relative Name and Phone Number:  Khalin Royce - father; [phone]    Current Level of Care: Hospital Recommended Level of Care: Skilled Nursing Facility Prior Approval Number:    Date Approved/Denied:   PASRR Number: [id number]  Discharge Plan: SNF    Current Diagnoses: Patient Active Problem List   Diagnosis Date Noted  . Colitis, acute   . Polyp of transverse colon   . Cellulitis of multiple sites of buttock 10/20/2019  . Buttock wound, unspecified laterality, initial encounter 10/20/2019  . SIRS (systemic inflammatory response syndrome) (HCC) 10/20/2019  . Open wound of left knee 10/20/2019  . Esophagitis 10/20/2019  . Frequent UTI 02/01/2019  . Vertigo   . Frequent headaches 01/11/2019  . Acute diarrhea   . Dysphagia, post-stroke   . 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 Neurogenic bowel   . Neurogenic bladder, flaccid   . Essential hypertension 12/28/2018  . Obesity 12/28/2018  . Spina bifida (HCC) 12/28/2018  . Urinary retention 12/28/2018  . Seizure disorder (HCC) 12/28/2018  . Hypokalemia 12/28/2018  . Intracranial hemorrhage, cerebellar (HCC) 12/28/2018  . Cerebellar bleed (HCC)   . Leukocytosis   . Cytotoxic brain edema (HCC) 12/27/2018  . ICH (intracerebral hemorrhage) (HCC) L cerebellar, etiology ? HTN 12/25/2018    Orientation RESPIRATION BLADDER Height & Weight     Self, Time, Situation, Place  Normal Continent, Indwelling catheter (Patient self-caths 4 tmes per day - has neurogenic bladder) Weight: 152 lb 9 oz (69.2  kg) Height:  5' (152.4 cm)  BEHAVIORAL SYMPTOMS/MOOD NEUROLOGICAL BOWEL NUTRITION STATUS      Continent Diet (Soft diet and will be advanced as tolerated)  AMBULATORY STATUS COMMUNICATION OF NEEDS Skin   Supervision (Min guard per PT) Verbally Other (Comment) (Non-pressure would to left knee, non-draining; MASD right/left buttocks-cleased daily and Gerhardt's cream applied)                       Personal Care Assistance Level of Assistance  Bathing, Feeding, Dressing Bathing Assistance: Limited assistance Feeding assistance: Limited assistance (Assistance with set-up) Dressing Assistance: Limited assistance Ecologist Assist per OT)     Functional Limitations Info  Sight, Hearing, Speech Sight Info: Impaired (Right and left eyes) Hearing Info: Adequate Speech Info: Adequate    SPECIAL CARE FACTORS FREQUENCY  PT (By licensed PT), OT (By licensed OT)     PT Frequency: Evaluated 7/1 - PT at SNF eval and treat, a minimum of 5 times per week OT Frequency: Evaluated 7/2 - OT at SNF eval and treat, a minimum of 5 times per week            Contractures Contractures Info: Not present    Additional Factors Info  Code Status, Allergies Code Status Info: Full Allergies Info: Penicillins           Current Medications (11/07/2019):  This is the current hospital active medication list Current Facility-Administered Medications  Medication Dose Route Frequency Provider Last Rate Last Admin  . acetaminophen (TYLENOL) tablet 650 mg  650 mg Oral Q6H PRN  Marinda Elk, MD   650 mg at 11/02/19 2123   Or  . acetaminophen (TYLENOL) suppository 650 mg  650 mg Rectal Q6H PRN Marinda Elk, MD      . atenolol (TENORMIN) tablet 12.5 mg  12.5 mg Oral BID Vann, Jessica U, DO   12.5 mg at 11/07/19 1000  . cholecalciferol (VITAMIN D3) tablet 2,000 Units  2,000 Units Oral Daily Shalhoub, Deno Lunger, MD   2,000 Units at 11/07/19 1000  . flavoxATE (URISPAS) tablet 100 mg  100 mg Oral TID PRN  Shalhoub, Deno Lunger, MD      . Gerhardt's butt cream   Topical 6 X Daily Zannie Cove, MD   Given at 11/07/19 1001  . l-methylfolate-B6-B12 (METANX) 3-35-2 MG per tablet 1 tablet  1 tablet Oral Daily Marlin Canary U, DO   1 tablet at 11/07/19 1000  . lactobacillus acidophilus (BACID) tablet 2 tablet  2 tablet Oral Daily Shalhoub, Deno Lunger, MD   2 tablet at 11/07/19 1000  . levETIRAcetam (KEPPRA) tablet 1,500 mg  1,500 mg Oral BID Marinda Elk, MD   1,500 mg at 11/07/19 1000  . loperamide (IMODIUM) capsule 2 mg  2 mg Oral Daily PRN Zannie Cove, MD   2 mg at 11/06/19 0919  . multivitamin with minerals tablet 1 tablet  1 tablet Oral Daily Marlin Canary U, DO   1 tablet at 11/07/19 1000  . nystatin cream (MYCOSTATIN)   Topical BID Shalhoub, Deno Lunger, MD   Given at 11/07/19 1001  . ondansetron (ZOFRAN) tablet 4 mg  4 mg Oral Q6H PRN Shalhoub, Deno Lunger, MD       Or  . ondansetron [facility]) injection 4 mg  4 mg Intravenous Q6H PRN Shalhoub, Deno Lunger, MD      . oxybutynin (DITROPAN) tablet 5 mg  5 mg Oral Q8H PRN Shalhoub, Deno Lunger, MD   5 mg at 11/03/19 2223  . pantoprazole (PROTONIX) EC tablet 40 mg  40 mg Oral QHS Zannie Cove, MD   40 mg at 11/06/19 2140     Discharge Medications: Please see discharge summary for a list of discharge medications.  Relevant Imaging Results:  Relevant Lab Results:   Additional Information ss#[id number]. Patient has braces for both legs and uses crutches to walk.  Cristobal Goldmann, LCSW

## 2019-11-07 NOTE — TOC Initial Note (Addendum)
Transition of Care [facility]) - Initial/Assessment Note    Patient Details  Name: Carlos Ferguson MRN: [id number] Date of Birth: Jul 01, 1965  [facility]) CM/SW Contact:    Carlos Goldmann, LCSW Phone Number: 11/07/2019, 1:37 PM  Clinical Narrative:  CSW talked with patient at Carlos bedside regarding his discharge disposition and   recommendation of ST rehab. Carlos Ferguson was lying in bed and was alert, oriented and agreeable to talking with CSW. Carlos Ferguson reported that he lives alone and his dad lives next door. His crutches and braces are in his room. Patient in agreement with ST rehab and requested [facility]-current name) and added that his 2nd cousin is a LTC resident at this facility. When asked, patient added that [facility] in [place] is another choice and he has been to [facility] in [place].   1:24 pm: Call made to [facility] and message left for Carlos Ferguson., admissions director.  2:05 pm: Call made to [facility] [phone]) and talked with Carlos Ferguson director regarding patient. He asked that clinicals be faxed to him, and added that Carlos fax machine is in his office [phone]).    2:47 pm - Talked with Carlos [facility]., admissions director at [facility]. CSW advised that Carlos nurse will review his clinicals this afternoon or tomorrow.       Expected Discharge Plan: Skilled Nursing Facility Barriers to Discharge: No [facility] bed (clinicals sent out 7/13 and preferred faclity contacted and VM left)   Patient Goals and CMS Choice Patient states their goals for this hospitalization and ongoing recovery are:: Patient in agreement wtih ST rehab CMS Medicare.gov Compare Post Acute Care list provided to:: Other (Comment Required) (Patient provided CSW with his preferred facility) Choice offered to / list presented to : Patient (Medicare.gov list not given at time of assessment, as patient provided CSW with his preferred  [facility])  Expected Discharge Plan and Services Expected Discharge Plan: Skilled Nursing Facility In-house Referral: Clinical Social Work     Living arrangements for Carlos past 2 months: Single Family Home                                      Prior Living Arrangements/Services Living arrangements for Carlos past 2 months: Single Family Home Lives with:: Self Patient language and need for interpreter reviewed:: No Do you feel safe going back to Carlos place where you live?: No (Patient agreeable to [facility] before returning home)      Need for Family Participation in Patient Care: No (Comment) Care giver support system in place?: No (comment)   Criminal Activity/Legal Involvement Pertinent to Current Situation/Hospitalization: No - Comment as needed  Activities of Daily Living Home Assistive Devices/Equipment: Wheelchair, Crutches ADL Screening (condition at time of admission) Patient's cognitive ability adequate to safely complete daily activities?: Yes Is Carlos patient deaf or have difficulty hearing?: No Does Carlos patient have difficulty seeing, even when wearing glasses/contacts?: No Does Carlos patient have difficulty concentrating, remembering, or making decisions?: No Patient able to express need for assistance with ADLs?: Yes Does Carlos patient have difficulty dressing or bathing?: No Independently performs ADLs?: Yes (appropriate for developmental age) Does Carlos patient have difficulty walking or climbing stairs?: Yes Weakness of Legs: Both Weakness of Arms/Hands: None  Permission Sought/Granted Permission sought to share information with : Family Supports, Carlos Ferguson granted to share information with :  Yes, Verbal Permission Granted  Share Information with Carlos Ferguson     Permission granted to share info w Relationship: Father  Permission granted to share info w Contact Information: [phone]  Emotional Assessment Appearance:: Appears  stated age Attitude/Demeanor/Rapport: Engaged Affect (typically observed): Appropriate Orientation: : Oriented to Place, Oriented to  Time, Oriented to Self, Oriented to Situation Alcohol / Substance Use: Tobacco Use, Alcohol Use, Illicit Drugs (Patient reported that he does not smoke, drink, alcohol or use illicit drugs) Psych Involvement: No (comment)  Admission diagnosis:  SIRS (systemic inflammatory response syndrome) (HCC) [R65.10] Cellulitis of multiple sites of buttock [L03.317] Colitis, acute [K52.9] Pressure injury of skin of sacral region, unspecified injury stage [L89.159] Patient Active Problem List   Diagnosis Date Noted  . Colitis, acute   . Polyp of transverse colon   . Cellulitis of multiple sites of buttock 10/20/2019  . Buttock wound, unspecified laterality, initial encounter 10/20/2019  . SIRS (systemic inflammatory response syndrome) (HCC) 10/20/2019  . Open wound of left knee 10/20/2019  . Esophagitis 10/20/2019  . Frequent UTI 02/01/2019  . Vertigo   . Frequent headaches 01/11/2019  . Acute diarrhea   . Dysphagia, post-stroke   . 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 Neurogenic bowel   . Neurogenic bladder, flaccid   . Essential hypertension 12/28/2018  . Obesity 12/28/2018  . Spina bifida (HCC) 12/28/2018  . Urinary retention 12/28/2018  . Seizure disorder (HCC) 12/28/2018  . Hypokalemia 12/28/2018  . Intracranial hemorrhage, cerebellar (HCC) 12/28/2018  . Cerebellar bleed (HCC)   . Leukocytosis   . Cytotoxic brain edema (HCC) 12/27/2018  . ICH (intracerebral hemorrhage) (HCC) L cerebellar, etiology ? HTN 12/25/2018   PCP:  System, Pcp Not In Pharmacy:   [facility] - [place], [place] - [address] [place] [address] Phone: [phone] Fax: [phone]  [facility] [address] [place], [place] - [place] - [address] [place] [address] Phone:  [phone] Fax: [phone]     Social Determinants of Health (SDOH) Interventions  No SDOH interventions requested or needed at this time  Readmission Risk Interventions No flowsheet data found.

## 2019-11-07 NOTE — Progress Notes (Signed)
Physical Therapy Treatment Patient Details Name: Carlos Ferguson MRN: [id number] DOB: May 22, 1965 Today's Date: 11/07/2019    History of Present Illness Pt is a 54 y/o male admitted secondary to diarrhea and has bilateral buttock wounds. Imaging revelaed colitis of ascending and transverse colon. PMH includes spina bifida, CVA, chiari malformation s/p shunt, CVA, seizure, and HTN.     PT Comments    Pt continuing to make steady progress with mobility. Pt not accepted by CIR so will go ST-SNF. Remains motivated.    Follow Up Recommendations  SNF (CIR didn't accept)     Equipment Recommendations  None recommended by PT    Recommendations for Other Services       Precautions / Restrictions Precautions Precautions: Fall Required Braces or Orthoses: Other Brace Other Brace: R AFO, L KAFO Restrictions Weight Bearing Restrictions: No    Mobility  Bed Mobility Overal bed mobility: Modified Independent Bed Mobility: Supine to Sit     Supine to sit: Modified independent (Device/Increase time);HOB elevated     General bed mobility comments: Incr time  Transfers Overall transfer level: Needs assistance Equipment used: Crutches Transfers: Sit to/from Stand Sit to Stand: Min assist;From elevated surface         General transfer comment: Patient elevate bed until he can slide off of it into standing.   Ambulation/Gait Ambulation/Gait assistance: Min assist;+2 safety/equipment Gait Distance (Feet): 40 Feet Assistive device: Crutches Gait Pattern/deviations: Narrow base of support;Step-to pattern;Decreased step length - right;Decreased step length - left Gait velocity: decreased Gait velocity interpretation: <1.31 ft/sec, indicative of household ambulator General Gait Details: Pt with heavy anterior lean onto crutches and drags feet forward.    Stairs             Wheelchair Mobility    Modified Rankin (Stroke Patients Only)       Balance Overall balance  assessment: Needs assistance Sitting-balance support: Bilateral upper extremity supported Sitting balance-Leahy Scale: Good Sitting balance - Comments: Able to assist with donning braces in bed   Standing balance support: During functional activity;Bilateral upper extremity supported Standing balance-Leahy Scale: Poor Standing balance comment: crutches and min assist for static stand                            Cognition Arousal/Alertness: Awake/alert Behavior During Therapy: WFL for tasks assessed/performed Overall Cognitive Status: Within Functional Limits for tasks assessed                                        Exercises      General Comments        Pertinent Vitals/Pain Pain Assessment: No/denies pain    Home Living                      Prior Function            PT Goals (current goals can now be found in the care plan section) Progress towards PT goals: Progressing toward goals    Frequency    Min 2X/week      PT Plan Discharge plan needs to be updated;Frequency needs to be updated    Co-evaluation              AM-PAC PT "6 Clicks" Mobility   Outcome Measure  Help needed turning from your back to your side while in a  flat bed without using bedrails?: None Help needed moving from lying on your back to sitting on the side of a flat bed without using bedrails?: A Little Help needed moving to and from a bed to a chair (including a wheelchair)?: A Little Help needed standing up from a chair using your arms (e.g., wheelchair or bedside chair)?: A Lot Help needed to walk in hospital room?: A Lot Help needed climbing 3-5 steps with a railing? : Total 6 Click Score: 15    End of Session Equipment Utilized During Treatment: Gait belt Activity Tolerance: Patient tolerated treatment well Patient left: in chair;with call bell/phone within reach;with chair alarm set Nurse Communication: Mobility status PT Visit  Diagnosis: Muscle weakness (generalized) (M62.81);Other abnormalities of gait and mobility (R26.89)     Time: 5188-4166 PT Time Calculation (min) (ACUTE ONLY): 16 min  Charges:  $Gait Training: 8-22 mins                     [facility] PT Acute Rehabilitation Services Pager [phone] Office [phone]    Angelina Ok [facility] 11/07/2019, 1:08 PM

## 2019-11-08 LAB — CBC
HCT: 41.4 % (ref 39.0–52.0)
Hemoglobin: 13.2 g/dL (ref 13.0–17.0)
MCH: 29.5 pg (ref 26.0–34.0)
MCHC: 31.9 g/dL (ref 30.0–36.0)
MCV: 92.4 fL (ref 80.0–100.0)
Platelets: 233 10*3/uL (ref 150–400)
RBC: 4.48 MIL/uL (ref 4.22–5.81)
RDW: 15.5 % (ref 11.5–15.5)
WBC: 5.3 10*3/uL (ref 4.0–10.5)
nRBC: 0 % (ref 0.0–0.2)

## 2019-11-08 LAB — BASIC METABOLIC PANEL
Anion gap: 8 (ref 5–15)
BUN: 13 mg/dL (ref 6–20)
CO2: 28 mmol/L (ref 22–32)
Calcium: 9.3 mg/dL (ref 8.9–10.3)
Chloride: 106 mmol/L (ref 98–111)
Creatinine, Ser: 0.86 mg/dL (ref 0.61–1.24)
GFR calc Af Amer: >60 mL/min (ref >60)
GFR calc non Af Amer: >60 mL/min (ref >60)
Glucose, Bld: 130 mg/dL — ABNORMAL HIGH (ref 70–99)
Potassium: 4.2 mmol/L (ref 3.5–5.1)
Sodium: 142 mmol/L (ref 135–145)

## 2019-11-08 NOTE — Discharge Summary (Signed)
Physician Discharge Summary  Carlos Ferguson ZOX:[id number] DOB: Sep 08, 1965 DOA: 10/19/2019  PCP: System, Pcp Not In  Admit date: 10/19/2019 Discharge date: 11/08/2019  Time spent: 35 minutes  Recommendations for Outpatient Follow-up:  1. GI dr.Beavers-FU Colonoscopy/biopsy 2. Follow-up MRI brain in 3 months and neurosurgery Dr. 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   Essential hypertension   Seizure disorder (HCC)   Neurogenic bladder, flaccid   Acute diarrhea   Buttock wound, unspecified laterality, initial encounter   SIRS (systemic inflammatory response syndrome) (HCC)   Open wound of left knee   Esophagitis   Colitis, acute   Polyp of transverse colon   Discharge Condition: Stable  Diet recommendation: Heart healthy  Filed Weights   10/27/19 2105 10/29/19 2134 10/30/19 2100  Weight: 68.7 kg 69.2 kg 69.2 kg    History of present illness:  54 year old male with past medical history of spina bifida, hemorrhagicstroke10/2020, seizure disorder, neurogenic bladder (self caths 4 times daily), 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 -approximately 5 weeks or so ago he was started on an antibiotic therapy for suspected catheter associated urinary tract infection. Around this time, patient states that he began to develop diarrhea. Patient explains that this diarrhea is watery with occasional small amounts of blood. -In the weeks that followed, patient was placed on courses of both Bactrim and nitrofurantoin  for suspected urinary tract infection. -Presented to [facility] emergency room, work-up in the ED noted concern for ascending and transverse colitis, also noted to have low-grade temp of 100.5 and a white count 13.9, in addition noted to have full-thickness wounds on his bottom with tissue loss   Hospital Course:   Colitis Subacute diarrhea -CT on admission noted colitis of ascending and transverse colon -C. difficile PCR negative   -GI pathogen panel was negative  -Treated with IV ceftriaxone and Flagyl for 5 days and then changed to oral cephalosporin and Flagyl--now off antibiotics -GI consult appreciated:-- s/p colonoscopy 7/3:  normal appearing mucosa; biopsy without inflammation, polyp noted high-grade tubular adenoma, needs GI follow-up -PRN Imodium used off-and-on this admission -PT OT eval completed, initially CIR was recommended,, CIR recommends SNF for short-term rehab  2 New brain lesions- partially hemorrhagic- -found as part of evaluation of new onset right-sided facial numbness that started during this hospitalization, a week ago, MRI brain noted Two partially hemorrhagic and enhancing brain masses are new since August, one located at the junction of the right brainstem and cerebellar peduncle, and one arising from the left choroid plexus. -neurosurgery consulted, mets were suspected initially -TSH normal, Bhcg not detected, -CT chest abdomen pelvis negative for primary malignancy, multiple skin lesion but per patient, none that bleed or have changed to his knowlegde -NS consult appreciated, seen by Dr. Jordan Likes, recommended observation for now and repeat MRI brain on Monday, location not amenable to biopsy, repeat MRI completed last night which demonstrated some regression of right medullary lesion, per radiology this is most consistent with resolving hemorrhage and not a neoplasm, Dr. Jordan Likes recommended MRI brain in 3 months and follow-up with him as needed Patient aware of follow  up requirement with neurosurgery   Multiple full-thickness buttock wounds with tissue  loss -Present on admission -Worsened in the setting of ongoing diarrhea, MASD -Attempt to keep this clean and dry, frequent turning/position changes to offload the area -Continue local care, appreciate wound care consult -pain improved  Essential hypertension -Continue home regimen of atenolol but lower dose  Seizure disorder Spina bifida -Continue Keppra  Neurogenic bladder -Continue self-catheterization per home regimen  Esophagitis -Started PPI  Low b12 -repleted  Consultants GI NEurosurgery Dr.Pool  Procedures: s/p colonoscopy 7/3:  normal appearing mucosa; biopsy without inflammation, polyp noted high-grade tubular adenoma    Patient seen and examined, patient medically stable, plan for SNF today.   Discharge Exam: Vitals:   11/08/19 0524 11/08/19 0900  BP: 127/80 128/75  Pulse: 61 62  Resp: 18 18  Temp: 98.6 F (37 C) 98.6 F (37 C)  SpO2: 97% 98%   Gen: NAD CVS: S 1, S 2 RRR Lungs: Clear Abdomen: Soft, nontender, bowel sounds present Extremities: Short lower extremities with atrophy  Discharge Instructions   Discharge Instructions    Diet - low sodium heart healthy   Complete by: As directed    Discharge wound care:   Complete by: As directed    See above   Increase activity slowly   Complete by: As directed      Allergies as of 11/08/2019      Reactions   Latex Anaphylaxis, Hives   Penicillins Hives, Itching      Medication List    STOP taking these medications   divalproex 500 MG 24 hr tablet Commonly known as: DEPAKOTE ER   hydrocortisone cream 1 %   lisinopril 5 MG tablet Commonly known as: ZESTRIL   liver oil-zinc oxide 40 % ointment Commonly known as: DESITIN   metroNIDAZOLE 500 MG tablet Commonly known as: Flagyl   modafinil 100 MG tablet Commonly known as: PROVIGIL   nystatin powder Commonly known as: MYCOSTATIN/NYSTOP    pseudoephedrine 30 MG tablet Commonly known as: SUDAFED   scopolamine 1 MG/3DAYS Commonly known as: TRANSDERM-SCOP   senna 8.6 MG Tabs tablet Commonly known as: SENOKOT   sulfamethoxazole-trimethoprim 800-160 MG tablet Commonly known as: BACTRIM DS     TAKE these medications   acetaminophen 500 MG tablet Commonly known as: TYLENOL Take 1 tablet (500 mg total) by mouth every 6 (six) hours as needed for mild pain. What changed: how much to take   atenolol 25 MG tablet Commonly known as: TENORMIN Take 0.5 tablets (12.5 mg total) by mouth 2 (two) times daily. What changed:   how much to take  when to take this   flavoxATE 100 MG tablet Commonly known as: URISPAS Take 1 tablet (100 mg total) by mouth 3 (three) times daily as needed for bladder spasms.   Gerhardt's butt cream Crea Apply 1 application topically 4 (four) times daily.   levETIRAcetam 750 MG tablet Commonly known as: KEPPRA Take 1,500 mg by mouth 2 (two) times daily.   loperamide 2 MG capsule Commonly known as: IMODIUM Take 1 capsule (2 mg total) by mouth daily as needed for diarrhea or loose stools.   oxybutynin 5 MG tablet Commonly known as: DITROPAN Take 1 tablet (5 mg total) by mouth 3 (three) times daily. What changed:   when to take this  reasons to take this   Probiotic Caps Take 1 capsule by mouth daily.   Vitamin D (Cholecalciferol) 50 MCG (2000 UT) Caps Take 1 capsule by mouth daily.            Discharge  Care Instructions  (From admission, onward)         Start     Ordered   11/08/19 0000  Discharge wound care:       Comments: See above   11/08/19 1125         Allergies  Allergen Reactions  . Latex Anaphylaxis and Hives  . Penicillins Hives and Itching    Contact information for after-discharge care    Destination    [facility] Preferred SNF .   Service: Skilled Nursing Contact information: [address] [place] [address] [phone]                   The results of significant diagnostics from this hospitalization (including imaging, microbiology, ancillary and laboratory) are listed below for reference.    Significant Diagnostic Studies: DG Orthopantogram  Result Date: 10/29/2019 CLINICAL DATA:  54 year old male with upper lip numbness EXAM: ORTHOPANTOGRAM/PANORAMIC COMPARISON:  None. FINDINGS: No acute displaced fracture. Multiple dental amalgams. No periapical lucencies identified. IMPRESSION: Negative for acute displaced fracture Multiple dental amalgams. Electronically Signed   By: Gilmer Mor D.O.   On: 10/29/2019 14:34   DG Chest 2 View  Result Date: 10/19/2019 CLINICAL DATA:  54 year old male with fever and cough. EXAM: CHEST - 2 VIEW COMPARISON:  Chest radiograph dated 12/25/2018. FINDINGS: There is shallow inspiration. No focal consolidation, pleural effusion, pneumothorax. Stable cardiac silhouette. No acute osseous pathology. A shunt catheter appears in similar position. IMPRESSION: No active cardiopulmonary disease. Electronically Signed   By: Elgie Collard M.D.   On: 10/19/2019 19:17   CT SOFT TISSUE NECK W CONTRAST  Result Date: 10/31/2019 CLINICAL DATA:  Brain mass or lesion. 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 Unusual mass lesions right medulla. Neck CT requested to assess for primary tumor or other metastatic disease. EXAM: CT NECK WITH CONTRAST TECHNIQUE: Multidetector CT imaging of the neck was performed using the standard protocol following the bolus administration of intravenous contrast. CONTRAST:  31mL OMNIPAQUE IOHEXOL 300 MG/ML  SOLN COMPARISON:  12/25/2018.  MRI 10/30/2019. FINDINGS: Pharynx and larynx: No mucosal or submucosal mass. Salivary glands: Parotid and submandibular glands are normal. Thyroid: Normal Lymph nodes: No enlarged or low-density nodes on either side of the neck. Vascular: Carotid bifurcations  appear widely patent. Jugular veins are patent. Limited intracranial: See results of brain MRI. Visualized orbits: Not included. Mastoids and visualized paranasal sinuses: Clear Skeleton: Ordinary cervical spondylosis. Question abnormal enhancement in the epidural space or along the surface of the cord in the C2-3 through C4 region. Consider cervical MRI. Upper chest: See results of chest CT. Other: None IMPRESSION: No evidence of primary soft tissue mass lesion in the neck region. Question if there is some abnormal enhancement along the surface of the cord in the region from C2-3 through C4. This would be better evaluated with MRI of the cervical spine with and without contrast. This could relate to the chronic treated Chiari malformation. Electronically Signed   By: Paulina Fusi M.D.   On: 10/31/2019 17:25   CT CHEST WO CONTRAST  Result Date: 10/31/2019 CLINICAL DATA:  Intracranial metastatic disease, unknown primary. Chiari 2 malformation. EXAM: CT CHEST WITHOUT CONTRAST TECHNIQUE: Multidetector CT imaging of the chest was performed following the standard protocol without IV contrast. COMPARISON:  None. FINDINGS: Cardiovascular: There is mild left ventricular dilation noted with global cardiac size within normal limits. Mild coronary artery calcification. Trace pericardial fluid is  likely physiologic. Thoracic vasculature is unremarkable on this noncontrast examination. Mediastinum/Nodes: A 17 mm indeterminate nodule is seen within the right thyroid lobe, axial image number sign 14/3. No pathologic thoracic adenopathy. Lungs/Pleura: A 7 mm subpleural noncalcified indeterminate pulmonary nodule is seen within the inferior lingula at axial image number sign 77/4. The lungs are otherwise clear. No pneumothorax or pleural effusion. Central airways are widely patent. Upper Abdomen: Mild splenomegaly is identified, stable since prior CT examination of 10/19/2019, with the spleen measuring 13.8 cm in greatest  dimension. Gallstones are again identified within a contracted gallbladder. There is thickening of the distal esophagus circumferentially, a nonspecific finding that can be seen in the setting of esophagitis, such as reflux esophagitis. Musculoskeletal: Spina bifida of the a visualized thoracolumbar spine with diastasis of the a posterior elements of T11, T12 noted, in keeping with the patient's given history of Chiari malformation. No lytic or blastic bone lesions are seen. Shunt catheter tubing is seen within the a right anterior chest and appears discontiguous. IMPRESSION: 1. No definite primary source identified for the patient's intracranial metastatic disease. 1. Indeterminate lingular nodule. 2. Indeterminate left thyroid nodule. Given the patient's hemorrhagic intracranial metastatic disease, dedicated thyroid sonography is recommended for further evaluation 3. Findings in keeping with the patient's known diagnosis of Chiari 2 malformation. Shunt catheter tubing within the right anterior chest appears discontiguous and may be nonfunctional. Electronically Signed   By: Helyn Numbers MD   On: 10/31/2019 17:20   MR BRAIN W WO CONTRAST  Result Date: 11/05/2019 CLINICAL DATA:  Follow-up brainstem lesions. History of Chiari malformation with multiple previous intracranial hemorrhage. Question hemorrhage versus mass. EXAM: MRI HEAD WITHOUT AND WITH CONTRAST TECHNIQUE: Multiplanar, multiecho pulse sequences of the brain and surrounding structures were obtained without and with intravenous contrast. CONTRAST:  30mL GADAVIST GADOBUTROL 1 MMOL/ML IV SOLN COMPARISON:  10/30/2019.  12/25/2018.  11/14/2008. FINDINGS: Brain: Patient with chronic Chiari malformation and chronic ventriculomegaly. Chronic VP shunt entering the right occipital region. Ventricular size is stable. Lesion of the right pons and middle cerebellar peduncle has contracted slightly, measuring 7 mm in diameter compared with 8.5 previously. Edema  has diminished. This favors diagnosis of evolving intraparenchymal hemorrhage. No progressive finding to suggest that this represents tumor. Small progressive intraparenchymal hemorrhage at the right parietal vertex axial image 63, more prominent than seen previously. Today this measures 9.6 mm compared with about 6 mm previously. No enhancing component to suggest mass lesion. Numerous old brain hemorrhages as seen previously with gliosis and hemosiderin deposition. The ovoid mass previously felt to be within the choroid is in fact, I think, subependymal along the floor of the ventricle. This can be best appreciated on the sagittal and coronal imaging. This probably also represents a hemorrhage. Vascular: Major vessels at the base of the brain show flow. Skull and upper cervical spine: Negative Sinuses/Orbits: No significant sinus inflammation.  Orbits negative. Other: None IMPRESSION: Evolutionary changes of the right pons/middle cerebellar peduncle lesion consistent with evolving brain hemorrhage. The T1 bright focus is becoming smaller and the edema is resolving. The abnormality previously ascribed to the choroid plexus on the left is, I think, in fact a subependymal lesion along the floor of the lateral ventricle and is also consistent with a subacute hemorrhage. This is best appreciated on the sagittal and coronal imaging. Slightly progressive intraparenchymal hemorrhage at the right parietal vertex measuring 9.6 mm today. No significant mass effect or edema. Electronically Signed   By: Scherrie Bateman.D.  On: 11/05/2019 19:07   MR BRAIN W WO CONTRAST  Result Date: 10/30/2019 CLINICAL DATA:  54 year old male with history of Chiari 2 malformation, spina bifida, and intracranial hemorrhage. Expanding right side face numbness and tingling. EXAM: MRI HEAD WITHOUT AND WITH CONTRAST TECHNIQUE: Multiplanar, multiecho pulse sequences of the brain and surrounding structures were obtained without and with intravenous  contrast. CONTRAST:  6mL GADAVIST GADOBUTROL 1 MMOL/ML IV SOLN COMPARISON:  Brain MRI 12/25/2018. 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 Since 12/25/2018 there are new enhancing, partially hemorrhagic masses of the right inferior cerebellar peduncle (series 12, image 16) 15 mm diameter, and the left choroid plexus (12 mm diameter image 26). Associated new brainstem and cerebellar edema (series 7, image 9). Furthermore, T2* images today reveal blood products associated with those 2 lesions as well as a new hemorrhagic focus without definite enhancement in the left occipital lobe (series 8, image 10). And this is superimposed on stable multifocal chronic hemorrhage in both hemispheres, most notably the anterior left frontal gyrus (series 8, image 19) and left cerebellum (which was acute on 12/25/2018). However, there has been expected evolution of the left cerebellar hemorrhage since August, with only residual encephalomalacia and hemosiderin. No other abnormal intracranial enhancement identified. No dural thickening. No restricted diffusion or evidence of acute infarction. No midline shift. Ventriculomegaly and right occipital approach ventricular shunt appear stable. No extra-axial blood. Stable pituitary. Vascular: Major intracranial vascular flow voids appear stable since August 2020. The major dural venous sinuses are enhancing and appear to be patent. Skull and upper cervical spine: Visible cervical spine and bone marrow signal appears stable. Sinuses/Orbits: Chronic Disconjugate gaze. Stable orbits and sinuses. Other: Mastoids remain clear. Internal auditory structures less well visualized today. IMPRESSION: 1. Two partially hemorrhagic and enhancing brain masses are new since August, one located at the junction of the right brainstem and cerebellar peduncle, and one arising from  the left choroid plexus. And there is a new nonenhancing focus of hemorrhage in the left occipital lobe. Meanwhile, multifocal chronic cerebral hemorrhages elsewhere are stable since August, with expected evolution of the left cerebellar hemorrhage that occurred at that time. 2. New edema in the right brainstem and cerebellum associated with #1. Although underlying chronic intracranial mass effect and ventriculomegaly due to Chiari II malformation have not changed. 3. 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 Brain metastases from melanoma, renal cell carcinoma, thyroid carcinoma, and choriocarcinoma will commonly be hemorrhagic. Electronically Signed   By: Odessa FlemingH  Hall M.D.   On: 10/30/2019 14:35   US SCROTUM  Result Date: 10/31/2019 CLINICAL DATA:  Brain mass EXAM: ULTRASOUND OF SCROTUM TECHNIQUE: Complete ultrasound examination of the testicles, epididymis, and other scrotal structures was performed. COMPARISON:  None. FINDINGS: Right testicle Measurements: 4 x 2.7 x 2.8 cm. No mass or microlithiasis visualized. Left testicle Measurements: 3.6 x 2.1 x 3 cm. No mass or microlithiasis visualized. Right epididymis:  Normal in size and appearance. Left epididymis:  Normal in size and appearance. Hydrocele:  Tiny left hydrocele. Varicocele:  None visualized. IMPRESSION: Tiny left hydrocele. Negative for scrotal or testicular mass lesion. Electronically Signed   By: Jasmine PangKim  Fujinaga M.D.   On: 10/31/2019 21:24   CT Abdomen Pelvis W Contrast  Result Date: 10/19/2019 CLINICAL DATA:  Diarrhea since taking  antibiotics for recurrent UTI. Wound on buttock. Abdominal abscess/infection suspected EXAM: CT ABDOMEN AND PELVIS WITH CONTRAST TECHNIQUE: Multidetector CT imaging of the abdomen and pelvis was performed using the standard protocol following bolus  administration of intravenous contrast. CONTRAST:  OMNIPAQUE IOHEXOL 300 MG/ML  SOLN COMPARISON:  CT 09/28/2019 FINDINGS: Lower chest: Hypoventilatory changes without confluent airspace disease. Mild distal esophageal wall thickening. Hepatobiliary: Decreased hepatic density consistent with steatosis. No discrete focal lesion. Decompressed gallbladder with multiple gallstones. No pericholecystic inflammation. No biliary dilatation. Pancreas: No ductal dilatation or inflammation. Spleen: Normal in size without focal abnormality. Adrenals/Urinary Tract: Normal adrenal glands. No hydronephrosis. There is early excretion of IV contrast from both renal collecting systems on portal venous phase. No significant perinephric edema. Decompressed urinary bladder which is minimally thick walled. Stomach/Bowel: Mild distal esophageal wall thickening. Stomach unremarkable. There is no small bowel obstruction or inflammation. Motion limits portions of small bowel assessment. Normal appendix. There is submucosal fatty infiltration involving the ascending and transverse colon with mild adjacent pericolonic edema. Liquid stool in the descending and scattered throughout the sigmoid colon. No significant colonic wall thickening. Scattered colonic diverticula without diverticulitis. Vascular/Lymphatic: Normal caliber abdominal aorta with minimal atherosclerosis. The portal vein is patent. No acute vascular findings. There multiple small retroperitoneal nodes are not enlarged by size criteria. An enlarged left inguinal node measuring 17 mm short axis, unchanged from prior exam and demonstrates normal lymph node morphology with retained fatty hila, likely reactive. There are few prominent left external iliac nodes. Reproductive: Prostate is unremarkable. Other: Mild skin thickening and soft tissue edema along the inferior gluteal crease. No definite subcutaneous fluid collection. Shunt catheter tubing in the pelvis appears  abandoned. This is discontinuous with subcutaneous catheter that is intermittently seen in the lower chest and upper abdominal wall. No intra-abdominal fluid collection or ascites. No free air. Small fat containing umbilical hernia. Musculoskeletal: Stable chronic changes of spinal dysraphism. Chronic deformity of the right proximal femur and acetabulum. Multilevel anterior osteophytes at the lumbar spine. There are no acute or suspicious osseous abnormalities. IMPRESSION: 1. Mild skin thickening and soft tissue edema along the inferior gluteal crease, suspicious for cellulitis. No subcutaneous fluid collection. 2. Submucosal fatty infiltration involving the ascending and transverse colon with mild adjacent pericolonic edema, suggesting colitis. Liquid stool in the descending and sigmoid colon, consistent with diarrheal illness. 3. Mild distal esophageal wall thickening, can be seen with reflux or esophagitis. 4. Hepatic steatosis. 5. Cholelithiasis without gallbladder inflammation. 6. Minimal colonic diverticulosis without diverticulitis. Aortic Atherosclerosis (ICD10-I70.0). Electronically Signed   By: Narda Rutherford M.D.   On: 10/19/2019 21:59    Microbiology: Recent Results (from the past 240 hour(s))  SARS CORONAVIRUS 2 (TAT 6-24 HRS) Nasopharyngeal Nasopharyngeal Swab     Status: None   Collection Time: 11/07/19  3:54 PM   Specimen: Nasopharyngeal Swab  Result Value Ref Range Status   SARS Coronavirus 2 NEGATIVE NEGATIVE Final    Comment: (NOTE) SARS-CoV-2 target nucleic acids are NOT DETECTED.  The SARS-CoV-2 RNA is generally detectable in upper and lower respiratory specimens during the acute phase of infection. Negative results do not preclude SARS-CoV-2 infection, do not rule out co-infections with other pathogens, and should not be used as the sole basis for treatment or other patient management decisions. Negative results must be combined with clinical observations, patient history,  and epidemiological information. The expected result is Negative.  Fact Sheet for Patients: HairSlick.no  Fact Sheet for Healthcare Providers: quierodirigir.com  This test is  not yet approved or cleared by the [place] and  has been authorized for detection and/or diagnosis of SARS-CoV-2 by FDA under an Emergency Use Authorization (EUA). This EUA will remain  in effect (meaning this test can be used) for the duration of the COVID-19 declaration under Se ction 564(b)(1) of the Act, 21 U.S.C. section 360bbb-3(b)(1), unless the authorization is terminated or revoked sooner.  Performed at [facility], [address], [place], [place] [address]      Labs: Basic Metabolic Panel: Recent Labs  Lab 11/05/19 0632  NA 139  K 4.5  CL 103  CO2 26  GLUCOSE 102*  BUN 11  CREATININE 0.69  CALCIUM 9.2   Liver Function Tests: No results for input(s): AST, ALT, ALKPHOS, BILITOT, PROT, ALBUMIN in the last 168 hours. No results for input(s): LIPASE, AMYLASE in the last 168 hours. No results for input(s): AMMONIA in the last 168 hours. CBC: Recent Labs  Lab 11/08/19 1046  WBC 5.3  HGB 13.2  HCT 41.4  MCV 92.4  PLT 233   Cardiac Enzymes: No results for input(s): CKTOTAL, CKMB, CKMBINDEX, TROPONINI in the last 168 hours. BNP: BNP (last 3 results) No results for input(s): BNP in the last 8760 hours.  ProBNP (last 3 results) No results for input(s): PROBNP in the last 8760 hours.  CBG: No results for input(s): GLUCAP in the last 168 hours.     Signed:  Alba Cory MD.  [facility] 11/08/2019, 11:26 AM

## 2019-11-08 NOTE — Progress Notes (Signed)
Report called to Nurse Gearldine Bienenstock at [facility]. Dondra Spry, RN

## 2019-11-08 NOTE — TOC Transition Note (Addendum)
Transition of Care [facility]) - CM/SW Discharge Note *11/08/19 - Discharged to [facility] *Room 127 *Number for Report: [phone]   Patient Details  Name: Carlos Ferguson MRN: [id number] Date of Birth: [birth year]  Transition of Care [facility]) CM/SW Contact:  Cristobal Goldmann, LCSW Phone Number: 11/08/2019, 11:33 AM   Clinical Narrative:  Patient medically stable for discharge and going to [facility] for ST rehab. Discharge clinicals transmitted to facility, nurse provided with information to call report and [name] contacted his dad today and informed him of his discharge to SNF. Patient will be transported by non-emergency ambulance transport.   Final next level of care: Skilled Nursing Facility [facility]) Barriers to Discharge: Barriers Resolved   Patient Goals and CMS Choice Patient states their goals for this hospitalization and ongoing recovery are:: Patient in agreement with ST rehab CMS Medicare.gov Compare Post Acute Care list provided to:: Other (Comment Required) (Patient provided CSW with facility preferences) Choice offered to / list presented to : NA  Discharge Placement   Existing PASRR number confirmed : 11/07/19          Patient chooses bed at: [facility]) Patient to be transferred to facility by: Non-emergency ambulance transport Name of family member notified: Father [name] Patient and family notified of of transfer: 11/08/19  Discharge Plan and Services In-house Referral: Clinical Social Work                                   Social Determinants of Health (SDOH) Interventions  No SDOH interventions requested or needed at discharge   Readmission Risk Interventions No flowsheet data found.

## 2020-12-18 IMAGING — CT CT HEAD W/O CM
4 series · 16 of 47 positions shown, 18 images · non-contrast
Comparison: Prior CT from 12/25/2018.

CLINICAL DATA: Initial evaluation for acute altered mental status,
headache.

EXAM:
CT HEAD WITHOUT CONTRAST
TECHNIQUE: Contiguous axial images were obtained from the base of the skull
through the vertex without intravenous contrast.

[Series 3: head without · axial · non-contrast · 0.46mm/px · z∈[-160,-10]mm · 7 of 40 slices shown, 9 images]
[im 5/40  brain]
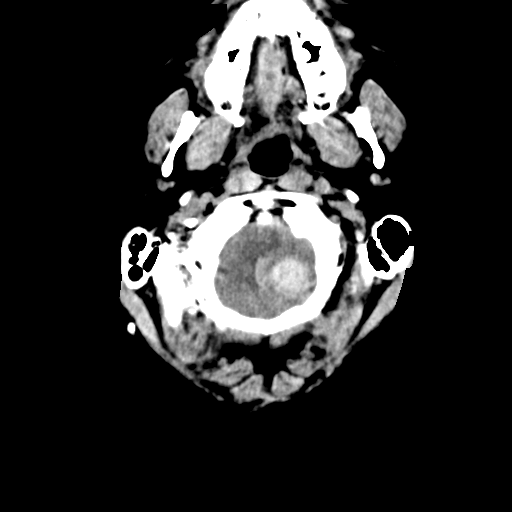
[im 5/40  bone]
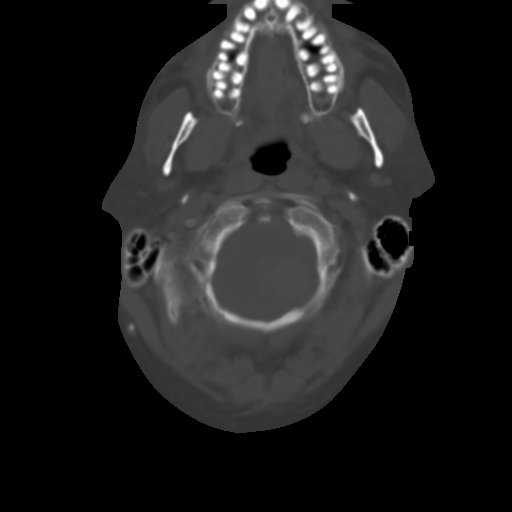
[im 10/40  brain]
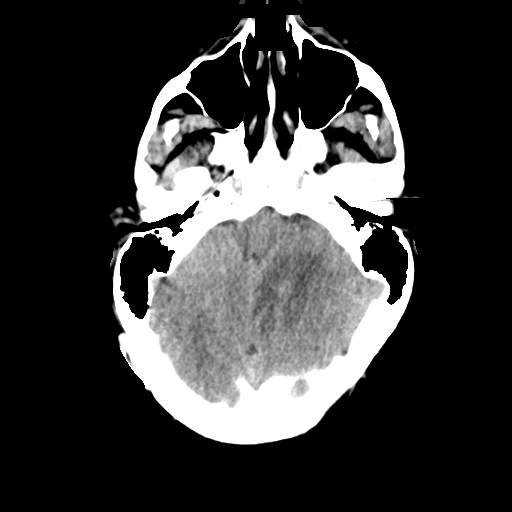
[im 15/40  brain]
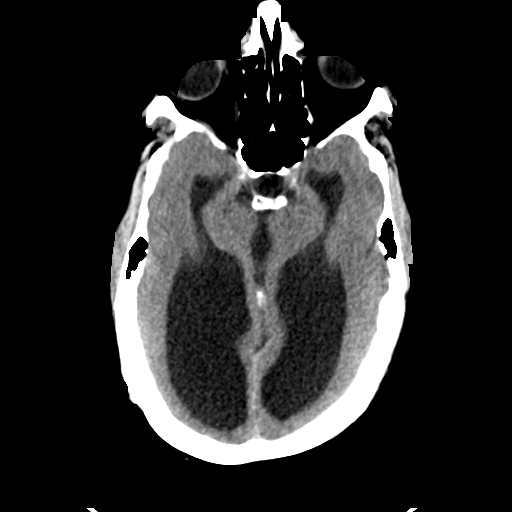
[im 20/40  brain]
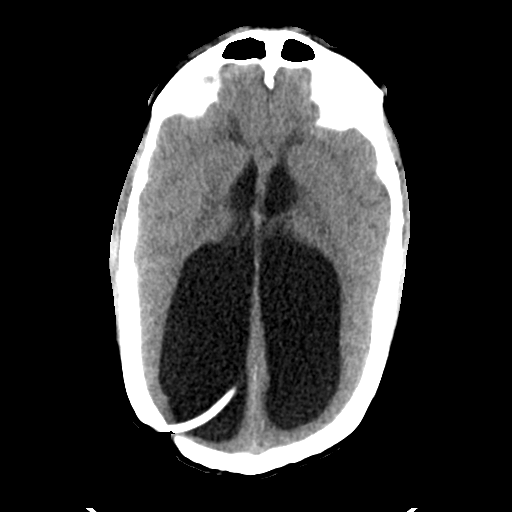
[im 25/40  brain]
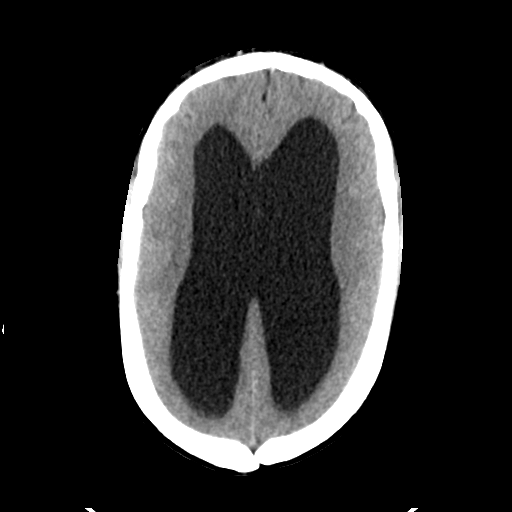
[im 25/40  bone]
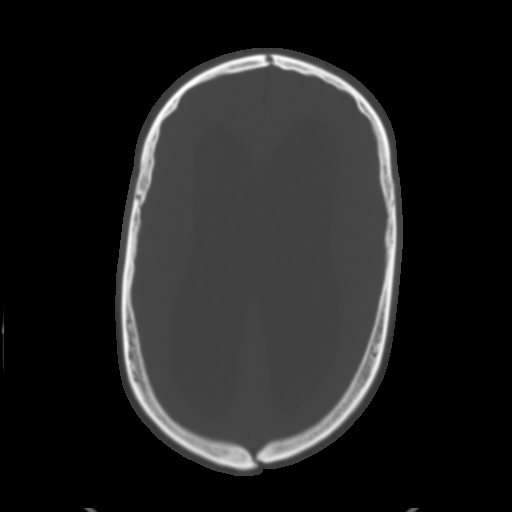
[im 30/40  brain]
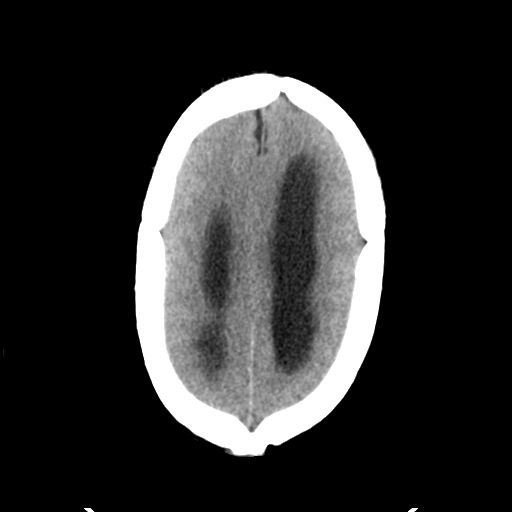
[im 35/40  brain]
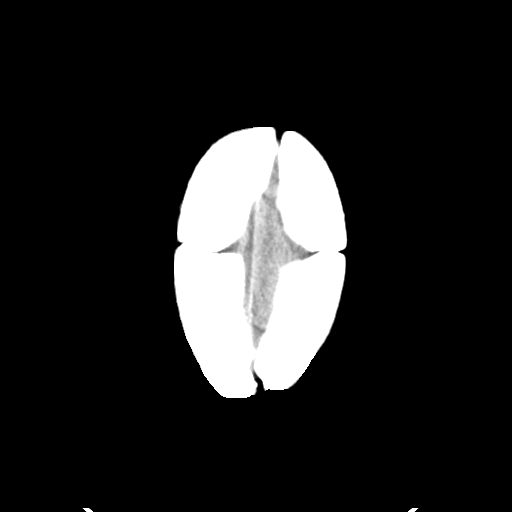

[Series 4: head bone · axial · 0.46mm/px · z∈[-162,-122]mm · 3 of 100 slices shown]
[im 10/100  bone]
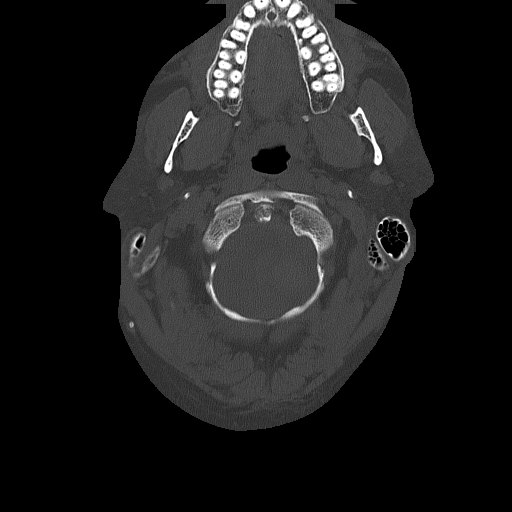
[im 20/100  bone]
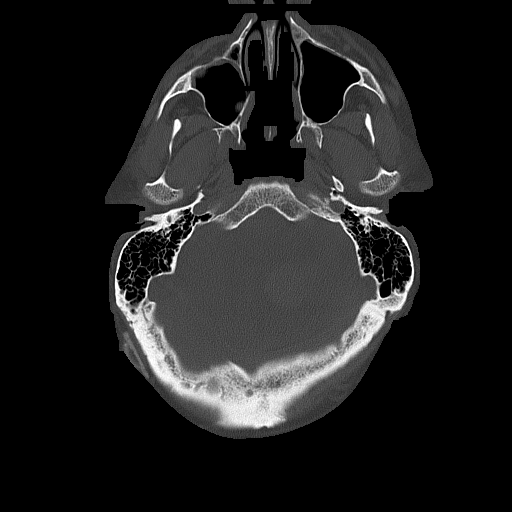
[im 30/100  bone]
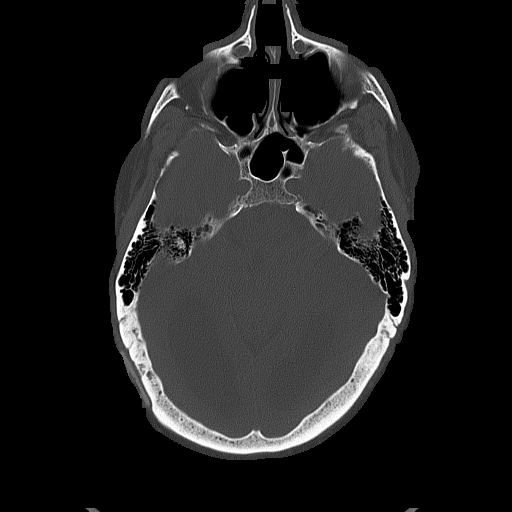

[Series 5: head without cor · coronal · non-contrast · 0.39mm/px · 3 of 80 slices shown]
[im 27/80  brain]
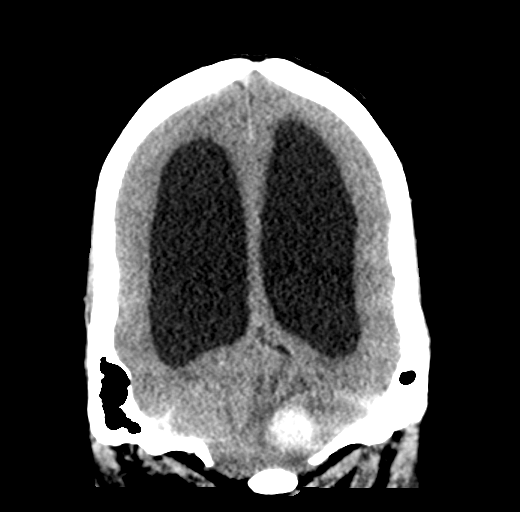
[im 36/80  brain]
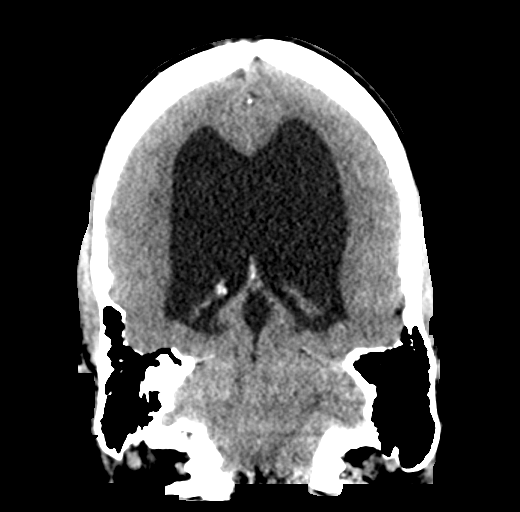
[im 44/80  brain]
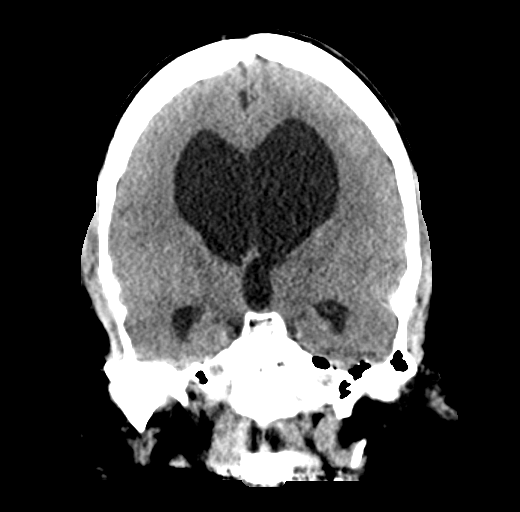

[Series 6: head without sag · sagittal · non-contrast · 0.42mm/px · 3 of 56 slices shown]
[im 19/56  brain]
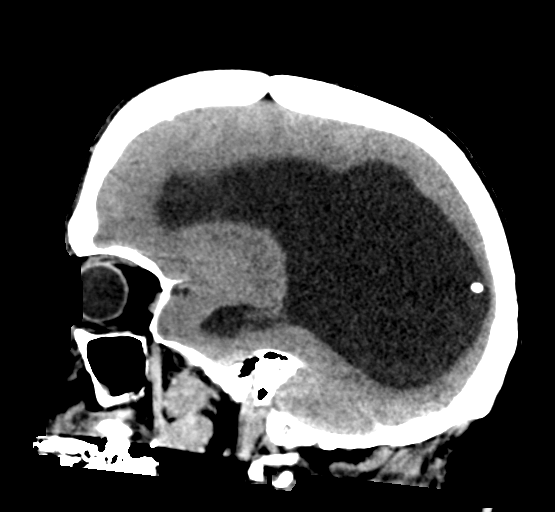
[im 28/56  brain]
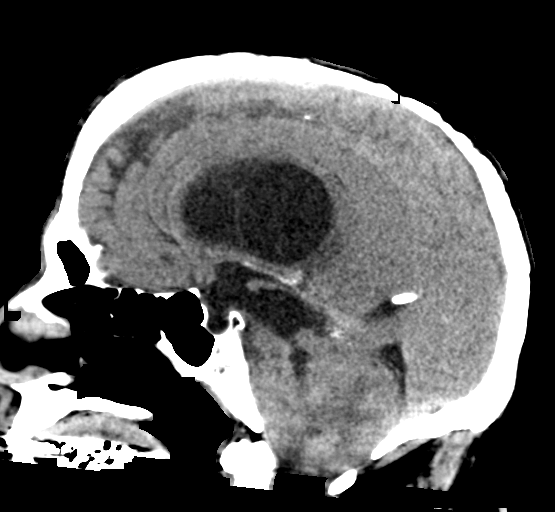
[im 37/56  brain]
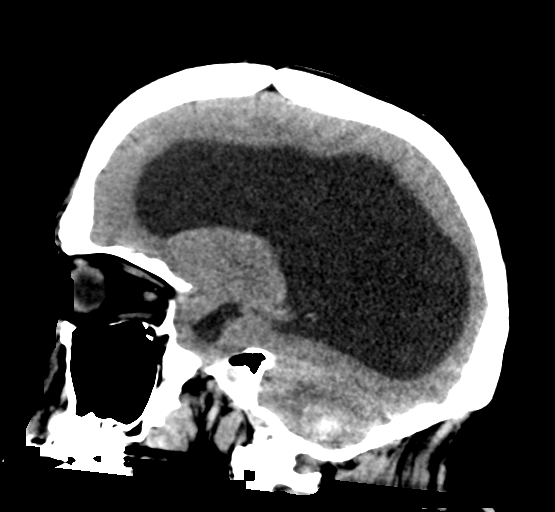

[16 of 47 positions shown; findings below may reference images not displayed]

FINDINGS: Brain: Previously identified intraparenchymal hemorrhage centered at
the left cerebellum is little interval changed in size in
appearance, measuring 2.9 cm on today's exam. Associated localized
vasogenic edema with mild regional mass effect is relatively similar
as well. Partial effacement of the fourth ventricle and fourth
ventricular outflow tract. Additional small subacute hemorrhages
involving the anterior left frontal cortex (series 3, image 29 and
periventricular right frontal white matter (series 3, image 24) are
unchanged. No new intracranial hemorrhage.

No acute large vessel territory infarct. Stigmata of Chiari 2
malformation again seen. Right occipital approach shunt catheter in
place with tip near the inter hemispheric fissure, stable. Marked
ventriculomegaly is unchanged. No new finding.

Vascular: No appreciable hyperdense vessel.

Skull: Scalp soft tissues and calvarium demonstrate no acute
finding.

Sinuses/Orbits: Globes and orbital soft tissues within normal
limits. Mild mucosal thickening noted within the ethmoidal air cells
and maxillary sinuses. No mastoid effusion.

Other: None.
IMPRESSION: 1. No significant interval change in left cerebellar
intraparenchymal hemorrhage with associated localized vasogenic
edema. No other new acute intracranial abnormality.
2. Sequelae of Chiari 2 malformation with chronic ventriculomegaly
and right occipital shunt catheter in place, stable.

## 2021-10-09 IMAGING — CR DG CHEST 2V
2 series · 2 of 2 positions shown · non-contrast
Comparison: Chest radiograph dated 12/25/2018.

CLINICAL DATA: 53-year-old male with fever and cough.

EXAM:
CHEST - 2 VIEW

[chest lat]
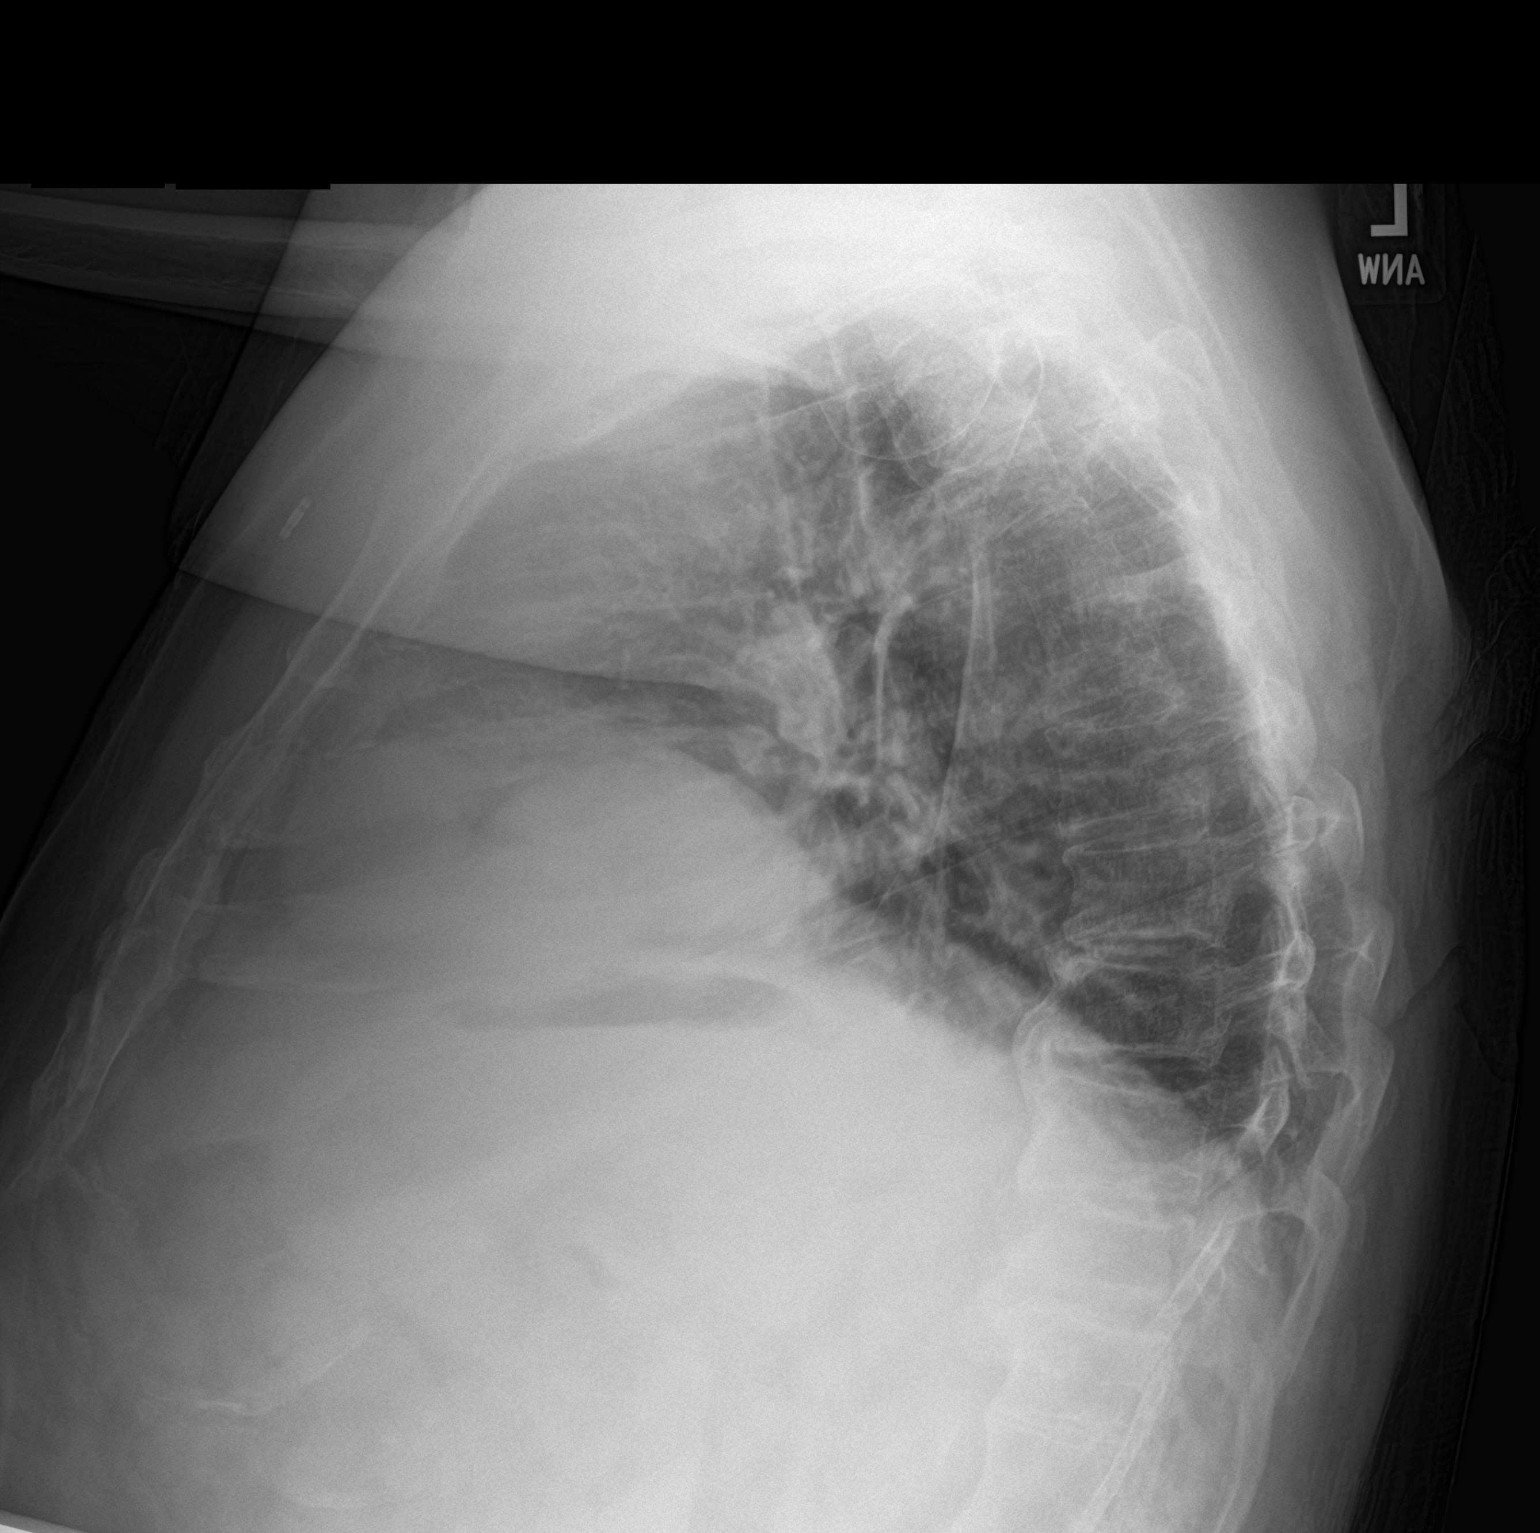

[chest ap]
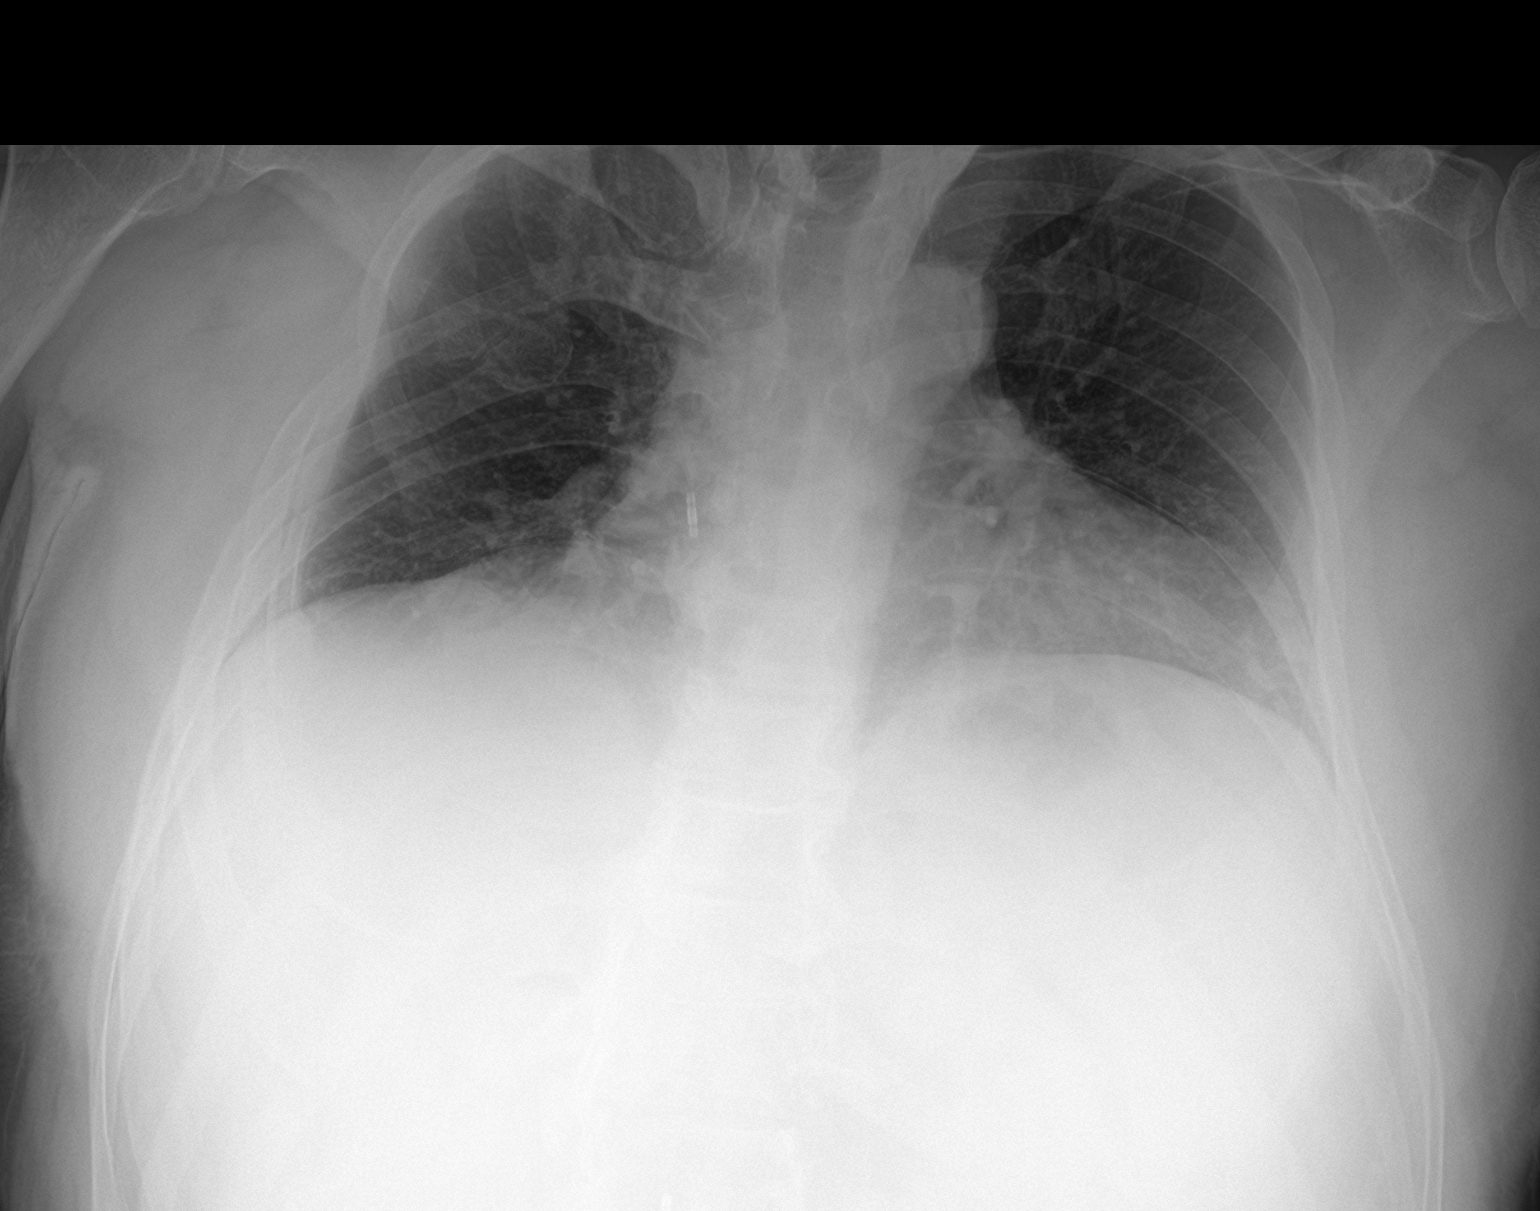

[2 of 2 positions shown; findings below may reference images not displayed]

FINDINGS: There is shallow inspiration. No focal consolidation, pleural
effusion, pneumothorax. Stable cardiac silhouette. No acute osseous
pathology. A shunt catheter appears in similar position.
IMPRESSION: No active cardiopulmonary disease.
